# Patient Record
Sex: Female | Born: 1935
Health system: Southern US, Community
[De-identification: ages and names within clinical notes are randomized; demographics above are authoritative.]

## PROBLEM LIST (undated history)

## (undated) DIAGNOSIS — J439 Emphysema, unspecified: Secondary | ICD-10-CM

## (undated) DIAGNOSIS — F0390 Unspecified dementia without behavioral disturbance: Secondary | ICD-10-CM

## (undated) DIAGNOSIS — S065XAA Traumatic subdural hemorrhage with loss of consciousness status unknown, initial encounter: Secondary | ICD-10-CM

## (undated) DIAGNOSIS — R011 Cardiac murmur, unspecified: Secondary | ICD-10-CM

## (undated) DIAGNOSIS — IMO0001 Reserved for inherently not codable concepts without codable children: Secondary | ICD-10-CM

## (undated) DIAGNOSIS — R0902 Hypoxemia: Secondary | ICD-10-CM

## (undated) DIAGNOSIS — Z8719 Personal history of other diseases of the digestive system: Secondary | ICD-10-CM

## (undated) DIAGNOSIS — R1319 Other dysphagia: Secondary | ICD-10-CM

## (undated) DIAGNOSIS — I10 Essential (primary) hypertension: Secondary | ICD-10-CM

## (undated) DIAGNOSIS — M1990 Unspecified osteoarthritis, unspecified site: Secondary | ICD-10-CM

## (undated) DIAGNOSIS — C50919 Malignant neoplasm of unspecified site of unspecified female breast: Secondary | ICD-10-CM

## (undated) DIAGNOSIS — S065X9A Traumatic subdural hemorrhage with loss of consciousness of unspecified duration, initial encounter: Secondary | ICD-10-CM

## (undated) DIAGNOSIS — F419 Anxiety disorder, unspecified: Secondary | ICD-10-CM

## (undated) DIAGNOSIS — J449 Chronic obstructive pulmonary disease, unspecified: Secondary | ICD-10-CM

## (undated) DIAGNOSIS — I739 Peripheral vascular disease, unspecified: Secondary | ICD-10-CM

## (undated) DIAGNOSIS — C801 Malignant (primary) neoplasm, unspecified: Secondary | ICD-10-CM

## (undated) DIAGNOSIS — J45909 Unspecified asthma, uncomplicated: Secondary | ICD-10-CM

## (undated) DIAGNOSIS — K219 Gastro-esophageal reflux disease without esophagitis: Secondary | ICD-10-CM

## (undated) DIAGNOSIS — I509 Heart failure, unspecified: Secondary | ICD-10-CM

## (undated) HISTORY — PX: APPENDECTOMY: SHX54

## (undated) HISTORY — PX: ABDOMINAL HYSTERECTOMY: SHX81

## (undated) HISTORY — PX: EYE SURGERY: SHX253

## (undated) HISTORY — PX: BREAST BIOPSY: SHX20

## (undated) HISTORY — DX: Anxiety disorder, unspecified: F41.9

## (undated) HISTORY — PX: MASTECTOMY: SHX3

## (undated) HISTORY — PX: TONSILLECTOMY: SUR1361

---

## 1989-12-01 DIAGNOSIS — C801 Malignant (primary) neoplasm, unspecified: Secondary | ICD-10-CM

## 1989-12-01 DIAGNOSIS — C50919 Malignant neoplasm of unspecified site of unspecified female breast: Secondary | ICD-10-CM

## 1989-12-01 HISTORY — DX: Malignant (primary) neoplasm, unspecified: C80.1

## 1989-12-01 HISTORY — PX: BREAST SURGERY: SHX581

## 1989-12-01 HISTORY — DX: Malignant neoplasm of unspecified site of unspecified female breast: C50.919

## 2005-03-06 ENCOUNTER — Ambulatory Visit: Payer: Self-pay | Admitting: Internal Medicine

## 2005-05-01 ENCOUNTER — Ambulatory Visit: Payer: Self-pay

## 2005-11-28 ENCOUNTER — Ambulatory Visit: Payer: Self-pay | Admitting: Internal Medicine

## 2006-03-12 ENCOUNTER — Ambulatory Visit: Payer: Self-pay | Admitting: Internal Medicine

## 2007-05-10 ENCOUNTER — Ambulatory Visit: Payer: Self-pay | Admitting: Internal Medicine

## 2007-08-04 ENCOUNTER — Ambulatory Visit: Payer: Self-pay | Admitting: Unknown Physician Specialty

## 2008-05-15 ENCOUNTER — Ambulatory Visit: Payer: Self-pay | Admitting: Internal Medicine

## 2008-05-31 ENCOUNTER — Emergency Department: Payer: Self-pay | Admitting: Emergency Medicine

## 2008-08-07 ENCOUNTER — Other Ambulatory Visit: Payer: Self-pay

## 2008-08-07 ENCOUNTER — Inpatient Hospital Stay: Payer: Self-pay | Admitting: Orthopedic Surgery

## 2008-08-08 ENCOUNTER — Other Ambulatory Visit: Payer: Self-pay

## 2008-08-19 ENCOUNTER — Ambulatory Visit: Payer: Self-pay | Admitting: Internal Medicine

## 2008-08-29 ENCOUNTER — Ambulatory Visit: Payer: Self-pay | Admitting: Orthopedic Surgery

## 2008-09-04 ENCOUNTER — Ambulatory Visit: Payer: Self-pay | Admitting: Orthopedic Surgery

## 2009-06-07 ENCOUNTER — Ambulatory Visit: Payer: Self-pay | Admitting: Internal Medicine

## 2009-06-21 ENCOUNTER — Ambulatory Visit: Payer: Self-pay | Admitting: Cardiology

## 2010-01-30 ENCOUNTER — Emergency Department: Payer: Self-pay | Admitting: Emergency Medicine

## 2010-06-20 ENCOUNTER — Ambulatory Visit: Payer: Self-pay | Admitting: Internal Medicine

## 2011-06-23 ENCOUNTER — Ambulatory Visit: Payer: Self-pay | Admitting: Internal Medicine

## 2012-06-23 ENCOUNTER — Ambulatory Visit: Payer: Self-pay | Admitting: Family Medicine

## 2013-06-27 ENCOUNTER — Ambulatory Visit: Payer: Self-pay | Admitting: Family Medicine

## 2014-04-13 ENCOUNTER — Ambulatory Visit: Payer: Self-pay | Admitting: Dermatology

## 2014-07-04 ENCOUNTER — Ambulatory Visit: Payer: Self-pay | Admitting: Family Medicine

## 2014-07-25 ENCOUNTER — Emergency Department: Payer: Self-pay | Admitting: Emergency Medicine

## 2014-07-26 ENCOUNTER — Observation Stay (HOSPITAL_COMMUNITY)
Admission: AD | Admit: 2014-07-26 | Discharge: 2014-07-26 | Disposition: A | Discharge status: Home / Self Care | Payer: Medicare Other | Source: Other Acute Inpatient Hospital | Attending: Neurosurgery | Admitting: Neurosurgery

## 2014-07-26 ENCOUNTER — Encounter (HOSPITAL_COMMUNITY): Payer: Self-pay | Admitting: *Deleted

## 2014-07-26 DIAGNOSIS — Y93E1 Activity, personal bathing and showering: Secondary | ICD-10-CM | POA: Diagnosis not present

## 2014-07-26 DIAGNOSIS — Y92009 Unspecified place in unspecified non-institutional (private) residence as the place of occurrence of the external cause: Secondary | ICD-10-CM | POA: Insufficient documentation

## 2014-07-26 DIAGNOSIS — I10 Essential (primary) hypertension: Secondary | ICD-10-CM | POA: Insufficient documentation

## 2014-07-26 DIAGNOSIS — W010XXA Fall on same level from slipping, tripping and stumbling without subsequent striking against object, initial encounter: Secondary | ICD-10-CM | POA: Insufficient documentation

## 2014-07-26 DIAGNOSIS — J45909 Unspecified asthma, uncomplicated: Secondary | ICD-10-CM | POA: Insufficient documentation

## 2014-07-26 DIAGNOSIS — S065XAA Traumatic subdural hemorrhage with loss of consciousness status unknown, initial encounter: Secondary | ICD-10-CM | POA: Diagnosis present

## 2014-07-26 DIAGNOSIS — S065X0A Traumatic subdural hemorrhage without loss of consciousness, initial encounter: Principal | ICD-10-CM | POA: Insufficient documentation

## 2014-07-26 DIAGNOSIS — Z853 Personal history of malignant neoplasm of breast: Secondary | ICD-10-CM | POA: Insufficient documentation

## 2014-07-26 DIAGNOSIS — Z9079 Acquired absence of other genital organ(s): Secondary | ICD-10-CM | POA: Diagnosis not present

## 2014-07-26 DIAGNOSIS — S065X9A Traumatic subdural hemorrhage with loss of consciousness of unspecified duration, initial encounter: Secondary | ICD-10-CM | POA: Diagnosis present

## 2014-07-26 DIAGNOSIS — Z9010 Acquired absence of unspecified breast and nipple: Secondary | ICD-10-CM | POA: Insufficient documentation

## 2014-07-26 DIAGNOSIS — Y998 Other external cause status: Secondary | ICD-10-CM | POA: Insufficient documentation

## 2014-07-26 DIAGNOSIS — S0180XA Unspecified open wound of other part of head, initial encounter: Secondary | ICD-10-CM | POA: Diagnosis not present

## 2014-07-26 DIAGNOSIS — Z885 Allergy status to narcotic agent status: Secondary | ICD-10-CM | POA: Insufficient documentation

## 2014-07-26 DIAGNOSIS — Z87891 Personal history of nicotine dependence: Secondary | ICD-10-CM | POA: Diagnosis not present

## 2014-07-26 DIAGNOSIS — R011 Cardiac murmur, unspecified: Secondary | ICD-10-CM | POA: Insufficient documentation

## 2014-07-26 HISTORY — DX: Essential (primary) hypertension: I10

## 2014-07-26 HISTORY — DX: Unspecified asthma, uncomplicated: J45.909

## 2014-07-26 HISTORY — DX: Malignant (primary) neoplasm, unspecified: C80.1

## 2014-07-26 HISTORY — DX: Traumatic subdural hemorrhage with loss of consciousness of unspecified duration, initial encounter: S06.5X9A

## 2014-07-26 HISTORY — DX: Traumatic subdural hemorrhage with loss of consciousness status unknown, initial encounter: S06.5XAA

## 2014-07-26 HISTORY — DX: Chronic obstructive pulmonary disease, unspecified: J44.9

## 2014-07-26 HISTORY — DX: Cardiac murmur, unspecified: R01.1

## 2014-07-26 LAB — CBC WITH DIFFERENTIAL/PLATELET
Basophils Absolute: 0 10*3/uL (ref 0.0–0.1)
Basophils Relative: 0 % (ref 0–1)
Eosinophils Absolute: 0.1 10*3/uL (ref 0.0–0.7)
Eosinophils Relative: 2 % (ref 0–5)
HCT: 33.8 % — ABNORMAL LOW (ref 36.0–46.0)
Hemoglobin: 11.7 g/dL — ABNORMAL LOW (ref 12.0–15.0)
Lymphocytes Relative: 18 % (ref 12–46)
Lymphs Abs: 0.8 10*3/uL (ref 0.7–4.0)
MCH: 30.4 pg (ref 26.0–34.0)
MCHC: 34.6 g/dL (ref 30.0–36.0)
MCV: 87.8 fL (ref 78.0–100.0)
Monocytes Absolute: 0.5 10*3/uL (ref 0.1–1.0)
Monocytes Relative: 10 % (ref 3–12)
NEUTROS PCT: 70 % (ref 43–77)
Neutro Abs: 3.2 10*3/uL (ref 1.7–7.7)
PLATELETS: 172 10*3/uL (ref 150–400)
RBC: 3.85 MIL/uL — AB (ref 3.87–5.11)
RDW: 12.7 % (ref 11.5–15.5)
WBC: 4.5 10*3/uL (ref 4.0–10.5)

## 2014-07-26 LAB — CBC
HCT: 36.3 % (ref 35.0–47.0)
HGB: 12.2 g/dL (ref 12.0–16.0)
MCH: 31 pg (ref 26.0–34.0)
MCHC: 33.6 g/dL (ref 32.0–36.0)
MCV: 92 fL (ref 80–100)
Platelet: 173 10*3/uL (ref 150–440)
RBC: 3.94 10*6/uL (ref 3.80–5.20)
RDW: 13.2 % (ref 11.5–14.5)
WBC: 5.1 10*3/uL (ref 3.6–11.0)

## 2014-07-26 LAB — COMPREHENSIVE METABOLIC PANEL
ALBUMIN: 3.4 g/dL (ref 3.4–5.0)
ANION GAP: 10 (ref 7–16)
AST: 32 U/L (ref 15–37)
Alkaline Phosphatase: 71 U/L
BUN: 11 mg/dL (ref 7–18)
Bilirubin,Total: 0.4 mg/dL (ref 0.2–1.0)
CHLORIDE: 97 mmol/L — AB (ref 98–107)
Calcium, Total: 8.8 mg/dL (ref 8.5–10.1)
Co2: 27 mmol/L (ref 21–32)
Creatinine: 0.85 mg/dL (ref 0.60–1.30)
EGFR (African American): >60
EGFR (Non-African Amer.): >60
GLUCOSE: 119 mg/dL — AB (ref 65–99)
Osmolality: 269 (ref 275–301)
POTASSIUM: 3.6 mmol/L (ref 3.5–5.1)
SGPT (ALT): 21 U/L
SODIUM: 134 mmol/L — AB (ref 136–145)
Total Protein: 6.3 g/dL — ABNORMAL LOW (ref 6.4–8.2)

## 2014-07-26 LAB — URINALYSIS, COMPLETE
Bilirubin,UR: NEGATIVE
Blood: NEGATIVE
Glucose,UR: NEGATIVE mg/dL (ref 0–75)
NITRITE: NEGATIVE
PROTEIN: NEGATIVE
Ph: 7 (ref 4.5–8.0)
RBC,UR: 1 /HPF (ref 0–5)
SQUAMOUS EPITHELIAL: NONE SEEN
Specific Gravity: 1.01 (ref 1.003–1.030)
WBC UR: 1 /HPF (ref 0–5)

## 2014-07-26 LAB — APTT: APTT: 30 s (ref 24–37)

## 2014-07-26 LAB — MRSA PCR SCREENING: MRSA by PCR: NEGATIVE

## 2014-07-26 LAB — PROTIME-INR
INR: 1
INR: 1.15 (ref 0.00–1.49)
PROTHROMBIN TIME: 12.9 s (ref 11.5–14.7)
PROTHROMBIN TIME: 14.7 s (ref 11.6–15.2)

## 2014-07-26 LAB — TROPONIN I

## 2014-07-26 MED ORDER — PANTOPRAZOLE SODIUM 40 MG PO TBEC
40.0000 mg | DELAYED_RELEASE_TABLET | Freq: Every day | ORAL | Status: DC
  Administered 2014-07-26: 40 mg via ORAL
  Filled 2014-07-26: qty 1

## 2014-07-26 MED ORDER — ONDANSETRON HCL 4 MG/2ML IJ SOLN: 4.0000 mg | Freq: Four times a day (QID) | INTRAMUSCULAR | Status: DC | PRN

## 2014-07-26 MED ORDER — SODIUM CHLORIDE 0.9 % IJ SOLN
3.0000 mL | Freq: Two times a day (BID) | INTRAMUSCULAR | Status: DC
  Administered 2014-07-26: 3 mL via INTRAVENOUS

## 2014-07-26 MED ORDER — SODIUM CHLORIDE 0.9 % IV SOLN: 250.0000 mL | INTRAVENOUS | Status: DC | PRN

## 2014-07-26 MED ORDER — BISACODYL 5 MG PO TBEC: 5.0000 mg | DELAYED_RELEASE_TABLET | Freq: Every day | ORAL | Status: DC | PRN

## 2014-07-26 MED ORDER — SENNA 8.6 MG PO TABS
1.0000 | ORAL_TABLET | Freq: Two times a day (BID) | ORAL | Status: DC
  Administered 2014-07-26: 8.6 mg via ORAL
  Filled 2014-07-26 (×2): qty 1

## 2014-07-26 MED ORDER — POTASSIUM CHLORIDE IN NACL 20-0.9 MEQ/L-% IV SOLN
INTRAVENOUS | Status: DC
  Administered 2014-07-26: 06:00:00 via INTRAVENOUS
  Filled 2014-07-26 (×2): qty 1000

## 2014-07-26 MED ORDER — SODIUM CHLORIDE 0.9 % IJ SOLN: 3.0000 mL | INTRAMUSCULAR | Status: DC | PRN

## 2014-07-26 MED ORDER — WHITE PETROLATUM GEL
Status: AC
  Administered 2014-07-26: 0.2
  Filled 2014-07-26: qty 5

## 2014-07-26 MED ORDER — ONDANSETRON HCL 4 MG PO TABS: 4.0000 mg | ORAL_TABLET | Freq: Four times a day (QID) | ORAL | Status: DC | PRN

## 2014-07-26 MED ORDER — POLYETHYLENE GLYCOL 3350 17 G PO PACK
17.0000 g | PACK | Freq: Every day | ORAL | Status: DC | PRN
  Filled 2014-07-26: qty 1

## 2014-07-26 MED ORDER — OXYCODONE HCL 5 MG PO TABS: 5.0000 mg | ORAL_TABLET | ORAL | Status: DC | PRN

## 2014-07-26 MED ORDER — MAGNESIUM CITRATE PO SOLN
1.0000 | Freq: Once | ORAL | Status: DC | PRN
  Filled 2014-07-26: qty 296

## 2014-07-26 MED ORDER — ACETAMINOPHEN 325 MG PO TABS
650.0000 mg | ORAL_TABLET | Freq: Four times a day (QID) | ORAL | Status: DC | PRN
  Administered 2014-07-26 (×2): 650 mg via ORAL
  Filled 2014-07-26 (×2): qty 2

## 2014-07-26 MED ORDER — ACETAMINOPHEN 650 MG RE SUPP: 650.0000 mg | Freq: Four times a day (QID) | RECTAL | Status: DC | PRN

## 2014-07-26 NOTE — Progress Notes (Signed)
Pt dc to home. Reviewed dc instructions, pt verbalized understanding, all belongings returned. VSS

## 2014-07-26 NOTE — H&P (Signed)
Melanie Huffman is an 80 y.o. female.   Chief Complaint: subdural hematoma, facial laceration HPI: whom tripped over a fan cord and fell in her bathroom sometime around 2200 8/25. At [facility] a head ct revealed a very small subdural hematoma without mass effect. She had a normal neurologic examination there, and was transferred for neurosurgical evaluation.   Past Medical History  Diagnosis Date  . Heart murmur   . Hypertension   . Asthma   . COPD (chronic obstructive pulmonary disease)   . Cancer 1991    L Breast  . Traumatic subdural hematoma     Past Surgical History  Procedure Laterality Date  . Breast surgery  1991    L Mastectomy  . Tonsillectomy    . Appendectomy    . Abdominal hysterectomy      Partial    History reviewed. No pertinent family history. Social History:  reports that she quit smoking about 35 years ago. Her smoking use included Cigarettes. She has a 10 pack-year smoking history. She has never used smokeless tobacco. She reports that she does not drink alcohol or use illicit drugs.  Allergies:  Allergies  Allergen Reactions  . Codeine Nausea Only    No prescriptions prior to admission    No results found for this or any previous visit (from the past 48 hour(s)). No results found.  Review of Systems  Constitutional: Negative.   HENT: Negative.   Eyes: Negative.   Respiratory:       Mild difficulty breathing at times  Cardiovascular: Negative.   Gastrointestinal: Negative.   Genitourinary: Negative.   Musculoskeletal: Positive for falls.  Skin: Negative.   Neurological: Negative.   Endo/Heme/Allergies: Negative.   Psychiatric/Behavioral: Negative.     Blood pressure 159/77, pulse 81, temperature 97.3 F (36.3 C), temperature source Oral, resp. rate 13, height 5\' 5"  (1.651 m), weight 57.2 kg (126 lb 1.7 oz), SpO2 94.00%. Physical Exam  Constitutional: She is oriented to person, place, and time. She appears well-developed and well-nourished. No  distress.  HENT:  Head: Normocephalic.  Right Ear: External ear normal.  Left Ear: External ear normal.  Nose: Nose normal.  Mouth/Throat: Oropharynx is clear and moist.  Forehead laceration  Eyes: Conjunctivae and EOM are normal. Pupils are equal, round, and reactive to light. Right eye exhibits no discharge. Left eye exhibits discharge.  Neck: Normal range of motion. Neck supple.  Cardiovascular: Normal rate, regular rhythm and normal heart sounds.   Respiratory: Effort normal and breath sounds normal.  GI: Soft. Bowel sounds are normal.  Musculoskeletal: Normal range of motion. She exhibits no edema and no tenderness.  Neurological: She is alert and oriented to person, place, and time. She has normal strength and normal reflexes. She displays normal reflexes. No cranial nerve deficit or sensory deficit. She exhibits normal muscle tone. Coordination normal. GCS eye subscore is 4. GCS verbal subscore is 5. GCS motor subscore is 6. She displays no Babinski's sign on the right side. She displays no Babinski's sign on the left side.  Normal sensory examination light touch and proprioception     Assessment/Plan Admit for observation overnight. Melanie Huffman is normal, there is no surgical indication at this time. No repeat study is indicated at this time. Will discharge tomorrow if no neurological changes.   Ina Scrivens L 07/26/2014, 5:17 AM

## 2014-07-26 NOTE — Discharge Summary (Signed)
Physician Discharge Summary  Patient ID: Melanie Huffman MRN: [id number] DOB/AGE: February 15, 1928 80 y.o.  Admit date: 07/26/2014 Discharge date: 07/26/2014  Admission Diagnoses:Closed head injury,sudural hematoma  Discharge Diagnoses: closed head injury Active Problems:   Subdural hematoma   Discharged Condition: good  Hospital Course: Melanie Huffman was transferred from [facility] for evaluation of a subdural hematoma. After viewing the ct I am not sure this is subdural blood, none the less she has had and now has a normal neurologic exam. The facial laceration was closed primarily and is dressed. She is able to be discharged home and does not need followup at our facility. I have however given her my information in case their is a problem.   Treatments: surgery: repair facial laceration  Discharge Exam: Blood pressure 154/84, pulse 82, temperature 97.7 F (36.5 C), temperature source Oral, resp. rate 21, height 5\' 5"  (1.651 m), weight 57.2 kg (126 lb 1.7 oz), SpO2 96.00%. General appearance: alert, cooperative, appears stated age and no distress Neurologic: Alert and oriented X 3, normal strength and tone. Normal symmetric reflexes. Normal coordination and gait  Disposition: Final discharge disposition not confirmed * No surgery found *    Medication List         aspirin EC 81 MG tablet  Take 81 mg by mouth daily.     calcium citrate-vitamin D 315-200 MG-UNIT per tablet  Commonly known as:  CITRACAL+D  Take 1 tablet by mouth 2 (two) times daily.     clobetasol 0.05 % Gel  Commonly known as:  TEMOVATE  Apply 1 application topically as needed (if gums bleed after brushing teeth).     fluocinonide gel 0.05 %  Commonly known as:  LIDEX  Apply 1 application topically as needed (use after brushing teeth if they do not bleed).     Fluticasone-Salmeterol 250-50 MCG/DOSE Aepb  Commonly known as:  ADVAIR  Inhale 1 puff into the lungs 2 (two) times daily.     furosemide 20 MG tablet   Commonly known as:  LASIX  Take 20 mg by mouth 2 (two) times daily.     hydrochlorothiazide 25 MG tablet  Commonly known as:  HYDRODIURIL  Take 25 mg by mouth daily.     multivitamin-iron-minerals-folic acid chewable tablet  Chew 1 tablet by mouth daily.     omeprazole 40 MG capsule  Commonly known as:  PRILOSEC  Take 40 mg by mouth 2 (two) times daily.     potassium chloride SA 20 MEQ tablet  Commonly known as:  K-DUR,KLOR-CON  Take 40 mEq by mouth 2 (two) times daily.     raloxifene 60 MG tablet  Commonly known as:  EVISTA  Take 60 mg by mouth daily.     ramipril 10 MG capsule  Commonly known as:  ALTACE  Take 20 mg by mouth 2 (two) times daily.     theophylline 100 MG 24 hr capsule  Commonly known as:  THEO-24  Take 100 mg by mouth 2 (two) times daily.     tiotropium 18 MCG inhalation capsule  Commonly known as:  SPIRIVA  Place 18 mcg into inhaler and inhale 2 (two) times daily.     VITAMIN C PO  Take 1 capsule by mouth daily.           Follow-up Information   Follow up with Martita Brumm L, MD. (call if your are unable to find a physician to remove the sutures)    Specialty:  Neurosurgery   Contact  information:   [place] [address] [phone]       Signed: Iona Stay L 07/26/2014, 3:10 PM

## 2014-07-26 NOTE — Discharge Instructions (Signed)
.  kc °

## 2014-07-26 NOTE — Progress Notes (Signed)
UR completed 

## 2014-07-27 ENCOUNTER — Emergency Department: Payer: Self-pay | Admitting: Emergency Medicine

## 2014-10-29 ENCOUNTER — Emergency Department: Payer: Self-pay | Admitting: Internal Medicine

## 2014-10-29 LAB — COMPREHENSIVE METABOLIC PANEL
ALBUMIN: 3.4 g/dL (ref 3.4–5.0)
ALT: 19 U/L
Alkaline Phosphatase: 60 U/L
Anion Gap: 8 (ref 7–16)
BUN: 12 mg/dL (ref 7–18)
Bilirubin,Total: 0.5 mg/dL (ref 0.2–1.0)
CREATININE: 0.68 mg/dL (ref 0.60–1.30)
Calcium, Total: 8.6 mg/dL (ref 8.5–10.1)
Chloride: 100 mmol/L (ref 98–107)
Co2: 27 mmol/L (ref 21–32)
Glucose: 123 mg/dL — ABNORMAL HIGH (ref 65–99)
OSMOLALITY: 271 (ref 275–301)
Potassium: 3.4 mmol/L — ABNORMAL LOW (ref 3.5–5.1)
SGOT(AST): 23 U/L (ref 15–37)
Sodium: 135 mmol/L — ABNORMAL LOW (ref 136–145)
TOTAL PROTEIN: 5.9 g/dL — AB (ref 6.4–8.2)

## 2014-10-29 LAB — CBC
HCT: 39.7 % (ref 35.0–47.0)
HGB: 13 g/dL (ref 12.0–16.0)
MCH: 30.4 pg (ref 26.0–34.0)
MCHC: 32.8 g/dL (ref 32.0–36.0)
MCV: 93 fL (ref 80–100)
Platelet: 152 10*3/uL (ref 150–440)
RBC: 4.28 10*6/uL (ref 3.80–5.20)
RDW: 13.4 % (ref 11.5–14.5)
WBC: 7.3 10*3/uL (ref 3.6–11.0)

## 2014-10-29 LAB — TROPONIN I: Troponin-I: <0.02 ng/mL

## 2014-10-29 LAB — D-DIMER(ARMC): D-Dimer: 369 ng/ml

## 2015-04-11 ENCOUNTER — Inpatient Hospital Stay
Admission: AD | Admit: 2015-04-11 | Discharge: 2015-04-16 | DRG: 516 | Disposition: A | Discharge status: Skilled Nursing Facility | Payer: PPO | Source: Ambulatory Visit | Attending: Orthopedic Surgery | Admitting: Orthopedic Surgery

## 2015-04-11 ENCOUNTER — Inpatient Hospital Stay: Payer: PPO

## 2015-04-11 DIAGNOSIS — K219 Gastro-esophageal reflux disease without esophagitis: Secondary | ICD-10-CM | POA: Diagnosis present

## 2015-04-11 DIAGNOSIS — S82002B Unspecified fracture of left patella, initial encounter for open fracture type I or II: Secondary | ICD-10-CM

## 2015-04-11 DIAGNOSIS — Z87891 Personal history of nicotine dependence: Secondary | ICD-10-CM | POA: Diagnosis not present

## 2015-04-11 DIAGNOSIS — S82032A Displaced transverse fracture of left patella, initial encounter for closed fracture: Secondary | ICD-10-CM | POA: Diagnosis present

## 2015-04-11 DIAGNOSIS — W1830XA Fall on same level, unspecified, initial encounter: Secondary | ICD-10-CM | POA: Diagnosis present

## 2015-04-11 DIAGNOSIS — J45909 Unspecified asthma, uncomplicated: Secondary | ICD-10-CM | POA: Diagnosis present

## 2015-04-11 DIAGNOSIS — J449 Chronic obstructive pulmonary disease, unspecified: Secondary | ICD-10-CM | POA: Diagnosis present

## 2015-04-11 DIAGNOSIS — R739 Hyperglycemia, unspecified: Secondary | ICD-10-CM | POA: Diagnosis present

## 2015-04-11 DIAGNOSIS — S82009A Unspecified fracture of unspecified patella, initial encounter for closed fracture: Secondary | ICD-10-CM | POA: Diagnosis present

## 2015-04-11 DIAGNOSIS — M810 Age-related osteoporosis without current pathological fracture: Secondary | ICD-10-CM | POA: Diagnosis present

## 2015-04-11 DIAGNOSIS — I10 Essential (primary) hypertension: Secondary | ICD-10-CM

## 2015-04-11 DIAGNOSIS — W19XXXA Unspecified fall, initial encounter: Secondary | ICD-10-CM | POA: Diagnosis present

## 2015-04-11 DIAGNOSIS — Z853 Personal history of malignant neoplasm of breast: Secondary | ICD-10-CM | POA: Diagnosis not present

## 2015-04-11 DIAGNOSIS — Z452 Encounter for adjustment and management of vascular access device: Secondary | ICD-10-CM

## 2015-04-11 DIAGNOSIS — I739 Peripheral vascular disease, unspecified: Secondary | ICD-10-CM | POA: Diagnosis present

## 2015-04-11 DIAGNOSIS — E876 Hypokalemia: Secondary | ICD-10-CM | POA: Diagnosis present

## 2015-04-11 DIAGNOSIS — Y929 Unspecified place or not applicable: Secondary | ICD-10-CM

## 2015-04-11 DIAGNOSIS — E871 Hypo-osmolality and hyponatremia: Secondary | ICD-10-CM | POA: Diagnosis present

## 2015-04-11 DIAGNOSIS — F419 Anxiety disorder, unspecified: Secondary | ICD-10-CM | POA: Diagnosis present

## 2015-04-11 DIAGNOSIS — Z419 Encounter for procedure for purposes other than remedying health state, unspecified: Secondary | ICD-10-CM

## 2015-04-11 LAB — URINALYSIS COMPLETE WITH MICROSCOPIC (ARMC ONLY)
Bacteria, UA: NONE SEEN
Bilirubin Urine: NEGATIVE
Glucose, UA: NEGATIVE mg/dL
HGB URINE DIPSTICK: NEGATIVE
Nitrite: NEGATIVE
Protein, ur: NEGATIVE mg/dL
Specific Gravity, Urine: 1.012 (ref 1.005–1.030)
pH: 7 (ref 5.0–8.0)

## 2015-04-11 LAB — BASIC METABOLIC PANEL
Anion gap: 7 (ref 5–15)
BUN: 11 mg/dL (ref 6–20)
CO2: 28 mmol/L (ref 22–32)
CREATININE: 0.68 mg/dL (ref 0.44–1.00)
Calcium: 8.8 mg/dL — ABNORMAL LOW (ref 8.9–10.3)
Chloride: 98 mmol/L — ABNORMAL LOW (ref 101–111)
GFR calc non Af Amer: >60 mL/min (ref >60)
Glucose, Bld: 137 mg/dL — ABNORMAL HIGH (ref 65–99)
Potassium: 3.2 mmol/L — ABNORMAL LOW (ref 3.5–5.1)
Sodium: 133 mmol/L — ABNORMAL LOW (ref 135–145)

## 2015-04-11 LAB — CBC
HCT: 39 % (ref 35.0–47.0)
Hemoglobin: 12.8 g/dL (ref 12.0–16.0)
MCH: 30.1 pg (ref 26.0–34.0)
MCHC: 33 g/dL (ref 32.0–36.0)
MCV: 91.3 fL (ref 80.0–100.0)
Platelets: 197 10*3/uL (ref 150–440)
RBC: 4.27 MIL/uL (ref 3.80–5.20)
RDW: 13.4 % (ref 11.5–14.5)
WBC: 4.3 10*3/uL (ref 3.6–11.0)

## 2015-04-11 LAB — PROTIME-INR
INR: 1
PROTHROMBIN TIME: 13.4 s (ref 11.4–15.0)

## 2015-04-11 MED ORDER — CALCIUM CITRATE-VITAMIN D 315-250 MG-UNIT PO TABS
1.0000 | ORAL_TABLET | Freq: Two times a day (BID) | ORAL | Status: DC
  Filled 2015-04-11 (×5): qty 2

## 2015-04-11 MED ORDER — CALCIUM CITRATE-VITAMIN D 315-200 MG-UNIT PO TABS
1.0000 | ORAL_TABLET | Freq: Two times a day (BID) | ORAL | Status: DC
  Filled 2015-04-11 (×5): qty 1

## 2015-04-11 MED ORDER — ASPIRIN EC 325 MG PO TBEC: 325.0000 mg | DELAYED_RELEASE_TABLET | Freq: Every day | ORAL | Status: DC

## 2015-04-11 MED ORDER — THEOPHYLLINE ER 100 MG PO CP24
100.0000 mg | ORAL_CAPSULE | Freq: Two times a day (BID) | ORAL | Status: DC
  Administered 2015-04-12 – 2015-04-16 (×7): 100 mg via ORAL
  Filled 2015-04-11 (×12): qty 1

## 2015-04-11 MED ORDER — TIOTROPIUM BROMIDE MONOHYDRATE 18 MCG IN CAPS
18.0000 ug | ORAL_CAPSULE | Freq: Two times a day (BID) | RESPIRATORY_TRACT | Status: DC
  Administered 2015-04-12 – 2015-04-16 (×6): 18 ug via RESPIRATORY_TRACT
  Filled 2015-04-11 (×2): qty 5

## 2015-04-11 MED ORDER — POTASSIUM CHLORIDE CRYS ER 20 MEQ PO TBCR
40.0000 meq | EXTENDED_RELEASE_TABLET | Freq: Two times a day (BID) | ORAL | Status: DC
  Administered 2015-04-11: 40 meq via ORAL
  Filled 2015-04-11 (×4): qty 2

## 2015-04-11 MED ORDER — MOMETASONE FURO-FORMOTEROL FUM 100-5 MCG/ACT IN AERO
2.0000 | INHALATION_SPRAY | Freq: Two times a day (BID) | RESPIRATORY_TRACT | Status: DC
  Administered 2015-04-11 – 2015-04-16 (×8): 2 via RESPIRATORY_TRACT
  Filled 2015-04-11: qty 8.8

## 2015-04-11 MED ORDER — PNEUMOCOCCAL VAC POLYVALENT 25 MCG/0.5ML IJ INJ
0.5000 mL | INJECTION | INTRAMUSCULAR | Status: DC
  Filled 2015-04-11: qty 0.5

## 2015-04-11 MED ORDER — FUROSEMIDE 20 MG PO TABS
20.0000 mg | ORAL_TABLET | Freq: Two times a day (BID) | ORAL | Status: DC
  Administered 2015-04-13: 20 mg via ORAL
  Filled 2015-04-11 (×3): qty 1

## 2015-04-11 MED ORDER — SODIUM CHLORIDE 0.9 % IV SOLN
INTRAVENOUS | Status: DC
  Administered 2015-04-11 – 2015-04-14 (×5): via INTRAVENOUS

## 2015-04-11 MED ORDER — MORPHINE SULFATE 2 MG/ML IJ SOLN
0.5000 mg | INTRAMUSCULAR | Status: DC | PRN
Start: 2015-04-11 — End: 2015-04-16
  Administered 2015-04-11 – 2015-04-12 (×3): 0.5 mg via INTRAVENOUS
  Filled 2015-04-11 (×3): qty 1

## 2015-04-11 MED ORDER — HYDROCODONE-ACETAMINOPHEN 5-325 MG PO TABS
1.0000 | ORAL_TABLET | Freq: Four times a day (QID) | ORAL | Status: DC | PRN
  Administered 2015-04-12 (×2): 2 via ORAL
  Administered 2015-04-13 – 2015-04-14 (×7): 1 via ORAL
  Administered 2015-04-15 (×2): 2 via ORAL
  Administered 2015-04-16: 1 via ORAL
  Filled 2015-04-11 (×3): qty 1
  Filled 2015-04-11 (×3): qty 2
  Filled 2015-04-11: qty 1
  Filled 2015-04-11: qty 2
  Filled 2015-04-11 (×5): qty 1

## 2015-04-11 MED ORDER — ASPIRIN EC 81 MG PO TBEC: 81.0000 mg | DELAYED_RELEASE_TABLET | Freq: Every day | ORAL | Status: DC

## 2015-04-11 MED ORDER — MAGNESIUM HYDROXIDE 400 MG/5ML PO SUSP
30.0000 mL | Freq: Every day | ORAL | Status: DC | PRN
  Administered 2015-04-12: 30 mL via ORAL
  Filled 2015-04-11: qty 30

## 2015-04-11 MED ORDER — RAMIPRIL 5 MG PO CAPS
20.0000 mg | ORAL_CAPSULE | Freq: Two times a day (BID) | ORAL | Status: DC
  Administered 2015-04-13 – 2015-04-16 (×6): 20 mg via ORAL
  Filled 2015-04-11 (×8): qty 4

## 2015-04-11 MED ORDER — RALOXIFENE HCL 60 MG PO TABS
60.0000 mg | ORAL_TABLET | Freq: Every day | ORAL | Status: DC
  Administered 2015-04-13 – 2015-04-16 (×4): 60 mg via ORAL
  Filled 2015-04-11 (×5): qty 1

## 2015-04-11 MED ORDER — HYDROCHLOROTHIAZIDE 25 MG PO TABS
25.0000 mg | ORAL_TABLET | Freq: Every day | ORAL | Status: DC
  Administered 2015-04-12: 25 mg via ORAL
  Filled 2015-04-11 (×2): qty 1

## 2015-04-11 MED ORDER — DOCUSATE SODIUM 100 MG PO CAPS
100.0000 mg | ORAL_CAPSULE | Freq: Two times a day (BID) | ORAL | Status: DC
  Administered 2015-04-11 – 2015-04-16 (×10): 100 mg via ORAL
  Filled 2015-04-11 (×11): qty 1

## 2015-04-11 MED ORDER — PANTOPRAZOLE SODIUM 40 MG PO TBEC
40.0000 mg | DELAYED_RELEASE_TABLET | Freq: Every day | ORAL | Status: DC
  Administered 2015-04-11 – 2015-04-16 (×5): 40 mg via ORAL
  Filled 2015-04-11 (×6): qty 1

## 2015-04-11 NOTE — H&P (Signed)
Patient presents with 1 day h/o left sided knee pain after a fall. She fell at [employer] when she was going there this morning to exercise. She denies loss of consciousness does know why she fell. She came into the walk-in clinic and external clinic was found to have a displaced patella fracture is being admitted for treatment of this..   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 Codeine causing nausea Sulfa causing nausea  Prior medical problems benign essential hypertension, COPD moderate, allergic rhinitis due to allergens, GERD without esophagitis, posture arthritis and osteoporosis History of breast cancer general anxiety and atypical chest pain  Current medications Advair Diskus 250-50 daily Hydralazine 25 mg daily HCTZ 25 mg daily Multivitamin daily Prilosec 20 mg daily Paxil 20 mg by mouth daily KCl 20 mg ER daily Evista 60 mg by mouth daily Altace 10 mg daily Theophylline 200 mg extended release Spiriva 18 g inhalation capsule daily Trazodone 50 mg at night Calcium and vitamin D daily  Review of systems is positive for the left knee pain but she does have a history of peripheral vascular disease as well and was to have a angioplasty by Dr. Lillia Pauls next week.  Family history noncontributory  Social history: Negative for alcohol, retired lives alone  Physical exam Gen.: Slender white female appears her stated age in mild distress secondary to left knee pain HEENT: Normal no evidence of trauma Lungs clear no wheezing noted Heart regular rate and rhythm no murmur noted Abdomen soft nontender Extremity exam left lower extremity: Palpable defect in the patella over centimeter with mild swelling and ecchymosis, distal neurovascularly near her sensation is  intact she does not have palpable pulses patella posterior tib or herself pedis. She is unable to maintain extension against gravity, unable to actively extend the knee  X-rays from walk-in clinic show displaced patella fracture  Impression: Displaced transverse patella fracture with loss of extension strength  Plan: Admit for ORIF tomorrow probably will require rehabilitation stay as she lives alone and has poor circulation initially. With her COPD we'll consult Dr. Netty Starring, her regular physician Risk benefits possible complications and alternatives were discussed

## 2015-04-11 NOTE — Plan of Care (Signed)
Problem: Phase III Progression Outcomes Goal: IV/normal saline lock discontinued Outcome: Progressing Discontinued Iv.  Pt to receive picc line

## 2015-04-11 NOTE — Consult Note (Signed)
Reason for Consult: Medical management/preop clearance  Referring Physician: Dr. Rudene Christians  HPI: Melanie Huffman is an 81 y.o. Female with a past medical history of hypertension/asthma/COPD who presents after a mechanical fall. Patient [place] the [place] and she [place]. She went to urgent care where they diagnosed her with a fracture of the left patella. Patient denies chest pain, dizziness, lightheadedness prior to her fall. She says this is purely a mechanical fall. Patient's been in her usual state of health without any chest pain, shortness of breath, or  Past Medical History  Diagnosis Date  . Heart murmur   . Hypertension   . Asthma   . COPD (chronic obstructive pulmonary disease)   . Cancer 1991    L Breast  . Traumatic subdural hematoma    peripheral arterial disease  Past Surgical History  Procedure Laterality Date  . Breast surgery  1991    L Mastectomy  . Tonsillectomy    . Appendectomy    . Abdominal hysterectomy      Partial    Family history: Positive for coronary artery disease, stroke, kidney failure  Social History:  reports that she quit smoking about 36 years ago. Her smoking use included Cigarettes. She has a 10 pack-year smoking history. She has never used smokeless tobacco. She reports that she does not drink alcohol or use illicit drugs.  Allergies:  Allergies  Allergen Reactions  . Codeine Nausea Only    Medications: I have reviewed the patient's current medications.  Advair BID spiriva daily Ramipril 20 mg daily HCTZ 25 mg daily Asa 81 mg daily Lasix 40 mg BID KCL 20 mEq BID  Results for orders placed or performed during the hospital encounter of 04/11/15 (from the past 48 hour(s))  CBC     Status: None   Collection Time: 04/11/15 12:54 PM  Result Value Ref Range   WBC 4.3 3.6 - 11.0 K/uL   RBC 4.27 3.80 - 5.20 MIL/uL   Hemoglobin 12.8 12.0 - 16.0 g/dL   HCT 39.0 35.0 - 47.0 %   MCV 91.3 80.0 - 100.0 fL   MCH 30.1 26.0 - 34.0 pg   MCHC 33.0  32.0 - 36.0 g/dL   RDW 13.4 11.5 - 14.5 %   Platelets 197 150 - 440 K/uL  Protime-INR     Status: None   Collection Time: 04/11/15 12:54 PM  Result Value Ref Range   Prothrombin Time 13.4 11.4 - 15.0 seconds   INR 7.02   Basic metabolic panel     Status: Abnormal   Collection Time: 04/11/15 12:54 PM  Result Value Ref Range   Sodium 133 (L) 135 - 145 mmol/L   Potassium 3.2 (L) 3.5 - 5.1 mmol/L   Chloride 98 (L) 101 - 111 mmol/L   CO2 28 22 - 32 mmol/L   Glucose, Bld 137 (H) 65 - 99 mg/dL   BUN 11 6 - 20 mg/dL   Creatinine, Ser 0.68 0.44 - 1.00 mg/dL   Calcium 8.8 (L) 8.9 - 10.3 mg/dL   GFR calc non Af Amer >60 >60 mL/min   GFR calc Af Amer >60 >60 mL/min    Comment: (NOTE) The eGFR has been calculated using the CKD EPI equation. This calculation has not been validated in all clinical situations. eGFR's persistently <60 mL/min signify possible Chronic Kidney Disease.    Anion gap 7 5 - 15    Chest Portable 1 View  04/11/2015   .  IMPRESSION: No  radiographic evidence of acute cardiopulmonary disease, with chronic changes of emphysema.  Atherosclerosis.  Signed,  Dulcy Fanny. Earleen Newport, DO  [facility]  [facility]   Electronically Signed   By: Corrie Mckusick D.O.   On: 04/11/2015 13:37    Review of Systems  Constitutional: Negative for fever, chills, weight loss and malaise/fatigue.  HENT: Negative for ear pain, hearing loss and tinnitus.   Eyes: Negative for blurred vision and double vision.  Respiratory: Negative for cough and hemoptysis.   Cardiovascular: Positive for leg swelling. Negative for chest pain, palpitations and orthopnea.  Gastrointestinal: Negative for heartburn, nausea, vomiting, abdominal pain and diarrhea.  Genitourinary: Negative for dysuria.  Musculoskeletal: Negative for myalgias.  Skin: Negative for rash.  Neurological: Negative for dizziness, tingling, tremors and headaches.  Psychiatric/Behavioral: Negative for  depression.   Blood pressure 138/92, pulse 75, temperature 97.8 F (36.6 C), temperature source Oral, resp. rate 18, SpO2 95 %. Physical Exam  Constitutional: She is oriented to person, place, and time. She appears well-developed and well-nourished.  HENT:  Head: Normocephalic and atraumatic.  Eyes: Pupils are equal, round, and reactive to light.  Neck: Normal range of motion. Neck supple. No thyromegaly present.  Cardiovascular: Regular rhythm and normal heart sounds.  Exam reveals no friction rub.   No murmur heard. Respiratory: Breath sounds normal. No respiratory distress.  GI: Bowel sounds are normal. She exhibits no distension. There is no tenderness. There is no rebound.  Musculoskeletal: Normal range of motion. She exhibits edema. She exhibits no tenderness.  LLE edema for over a month  Neurological: She is alert and oriented to person, place, and time. No cranial nerve deficit.  Skin: Skin is warm and dry. No rash noted. No erythema.  Psychiatric: She has a normal mood and affect. Her behavior is normal.    Assessment/Plan: 81 year old female with a history of hypertension, left lower extremity PAD who presents after a mechanical fall with a left patella fracture. Hospitalist clearance for preoperative clearance.   1. Preoperative clearance: Patient is low risk for moderate risk procedure provided that her EKG does not anything acute (which I am doubtful of). We are awaiting EKG, If this does not show acute changes, she may proceed without further cardiac workup.  2. Hypertension: Patient should continue on her outpatient medications including Ramilpril and HCTZ.  3. Mild hyponatremia: Will cont with IVF and recheck in am. If sodium level is still low, I would suggest stopping HCTZ and ordering a beta blocker for HTN.  4. Hypokalemia: I will replete and check in am  5. Chronic LEE: Patient was planned for an outpatient procedure with Dr. Delana Meyer on Tuesday. We can have him  see her while she is here if this is okay with Dr. Rudene Christians.  6, Hyperglycemia: Recheck BMP in am. If still elevated then add HGBa1c to am labs.  7. COPD: I would continue her outpatient inhalers. There is no evidence of acute exacerbation at this time.   Thank you for allowing [place] to participate in the care of your patient. We will follow.   TIME 45 minutes   Batsheva Stevick 04/11/2015, 1:41 PM

## 2015-04-11 NOTE — Progress Notes (Signed)
Patient scheduled for surgery tomorrow. Iv infiltrated. [facility] called for Picc placement notified Dr.Hooten about unable to get iv access. Daughter at bedside, Pt npo at midnight.

## 2015-04-12 ENCOUNTER — Inpatient Hospital Stay: Payer: PPO | Admitting: Anesthesiology

## 2015-04-12 ENCOUNTER — Encounter
Admission: AD | Disposition: A | Discharge status: Skilled Nursing Facility | Payer: Self-pay | Source: Ambulatory Visit | Attending: Internal Medicine

## 2015-04-12 ENCOUNTER — Inpatient Hospital Stay: Payer: PPO

## 2015-04-12 ENCOUNTER — Encounter: Payer: Self-pay | Admitting: Anesthesiology

## 2015-04-12 HISTORY — PX: ORIF PATELLA: SHX5033

## 2015-04-12 LAB — CBC
HCT: 36.4 % (ref 35.0–47.0)
Hemoglobin: 12.4 g/dL (ref 12.0–16.0)
MCH: 30.9 pg (ref 26.0–34.0)
MCHC: 34.2 g/dL (ref 32.0–36.0)
MCV: 90.5 fL (ref 80.0–100.0)
PLATELETS: 195 10*3/uL (ref 150–440)
RBC: 4.02 MIL/uL (ref 3.80–5.20)
RDW: 13.2 % (ref 11.5–14.5)
WBC: 5.7 10*3/uL (ref 3.6–11.0)

## 2015-04-12 LAB — BASIC METABOLIC PANEL
ANION GAP: 5 (ref 5–15)
BUN: 7 mg/dL (ref 6–20)
CO2: 24 mmol/L (ref 22–32)
CREATININE: 0.47 mg/dL (ref 0.44–1.00)
Calcium: 7.2 mg/dL — ABNORMAL LOW (ref 8.9–10.3)
Chloride: 106 mmol/L (ref 101–111)
GFR calc Af Amer: >60 mL/min (ref >60)
GFR calc non Af Amer: >60 mL/min (ref >60)
Glucose, Bld: 85 mg/dL (ref 65–99)
Potassium: 2.9 mmol/L — CL (ref 3.5–5.1)
Sodium: 135 mmol/L (ref 135–145)

## 2015-04-12 LAB — POCT I-STAT 4, (NA,K, GLUC, HGB,HCT) (ARMC MAN. ENTRY)
HCT: 36 % (ref 36–46)
Hemoglobin: 12.2 g/dL (ref 12.0–16.0)
Potassium: 5 mmol/L (ref 3.4–5.3)
Sodium: 133 mmol/L — AB (ref 135–145)

## 2015-04-12 LAB — CREATININE, SERUM
Creatinine, Ser: 0.63 mg/dL (ref 0.44–1.00)
GFR calc Af Amer: >60 mL/min (ref >60)
GFR calc non Af Amer: >60 mL/min (ref >60)

## 2015-04-12 LAB — MAGNESIUM: MAGNESIUM: 1.4 mg/dL — AB (ref 1.7–2.4)

## 2015-04-12 SURGERY — OPEN REDUCTION INTERNAL FIXATION (ORIF) PATELLA
Anesthesia: Spinal | Laterality: Left

## 2015-04-12 MED ORDER — BUPIVACAINE HCL (PF) 0.5 % IJ SOLN
INTRAMUSCULAR | Status: DC | PRN
  Administered 2015-04-12: 3 mL

## 2015-04-12 MED ORDER — ONDANSETRON HCL 4 MG/2ML IJ SOLN
INTRAMUSCULAR | Status: DC | PRN
  Administered 2015-04-12: 4 mg via INTRAVENOUS

## 2015-04-12 MED ORDER — ONDANSETRON HCL 4 MG PO TABS
4.0000 mg | ORAL_TABLET | Freq: Four times a day (QID) | ORAL | Status: DC | PRN
  Administered 2015-04-15: 4 mg via ORAL
  Filled 2015-04-12: qty 1

## 2015-04-12 MED ORDER — PROPOFOL INFUSION 10 MG/ML OPTIME
INTRAVENOUS | Status: DC | PRN
  Administered 2015-04-12: 50 ug/kg/min via INTRAVENOUS

## 2015-04-12 MED ORDER — ENOXAPARIN SODIUM 30 MG/0.3ML ~~LOC~~ SOLN
30.0000 mg | Freq: Two times a day (BID) | SUBCUTANEOUS | Status: DC
  Administered 2015-04-13 – 2015-04-16 (×8): 30 mg via SUBCUTANEOUS
  Filled 2015-04-12 (×8): qty 0.3

## 2015-04-12 MED ORDER — HYDROMORPHONE HCL 1 MG/ML IJ SOLN: 0.2500 mg | INTRAMUSCULAR | Status: DC | PRN

## 2015-04-12 MED ORDER — ONDANSETRON HCL 4 MG/2ML IJ SOLN: 4.0000 mg | Freq: Once | INTRAMUSCULAR | Status: DC | PRN

## 2015-04-12 MED ORDER — FENTANYL CITRATE (PF) 100 MCG/2ML IJ SOLN: 25.0000 ug | INTRAMUSCULAR | Status: DC | PRN

## 2015-04-12 MED ORDER — NEOMYCIN-POLYMYXIN B GU 40-200000 IR SOLN
Status: AC
  Filled 2015-04-12: qty 4

## 2015-04-12 MED ORDER — CALCIUM CARBONATE-VITAMIN D 500-200 MG-UNIT PO TABS
1.0000 | ORAL_TABLET | Freq: Two times a day (BID) | ORAL | Status: DC
  Administered 2015-04-12 – 2015-04-16 (×8): 1 via ORAL
  Filled 2015-04-12 (×16): qty 1

## 2015-04-12 MED ORDER — DEXTROSE-NACL 5-0.9 % IV SOLN
INTRAVENOUS | Status: DC
  Administered 2015-04-13: via INTRAVENOUS

## 2015-04-12 MED ORDER — FENTANYL CITRATE (PF) 100 MCG/2ML IJ SOLN
INTRAMUSCULAR | Status: DC | PRN
  Administered 2015-04-12: 50 ug via INTRAVENOUS

## 2015-04-12 MED ORDER — CEFAZOLIN SODIUM 1-5 GM-% IV SOLN
INTRAVENOUS | Status: DC | PRN
  Administered 2015-04-12: 1 g via INTRAVENOUS

## 2015-04-12 MED ORDER — POTASSIUM CHLORIDE 20 MEQ/15ML (10%) PO SOLN
40.0000 meq | Freq: Once | ORAL | Status: AC
  Administered 2015-04-12: 40 meq via ORAL
  Filled 2015-04-12: qty 30

## 2015-04-12 MED ORDER — ACETAMINOPHEN 325 MG PO TABS
650.0000 mg | ORAL_TABLET | Freq: Four times a day (QID) | ORAL | Status: DC | PRN
  Administered 2015-04-12: 650 mg via ORAL
  Filled 2015-04-12: qty 2

## 2015-04-12 MED ORDER — NEOMYCIN-POLYMYXIN B GU IR SOLN
Status: DC | PRN
  Administered 2015-04-12: 4 mL

## 2015-04-12 MED ORDER — BISACODYL 10 MG RE SUPP
10.0000 mg | Freq: Every day | RECTAL | Status: DC | PRN
  Administered 2015-04-13 – 2015-04-14 (×2): 10 mg via RECTAL
  Filled 2015-04-12 (×2): qty 1

## 2015-04-12 MED ORDER — CEFAZOLIN SODIUM 1-5 GM-% IV SOLN
1.0000 g | Freq: Four times a day (QID) | INTRAVENOUS | Status: AC
  Administered 2015-04-12 – 2015-04-13 (×3): 1 g via INTRAVENOUS
  Filled 2015-04-12 (×4): qty 50

## 2015-04-12 MED ORDER — ADULT MULTIVITAMIN W/MINERALS CH
ORAL_TABLET | Freq: Every day | ORAL | Status: DC
  Administered 2015-04-13 – 2015-04-16 (×4): 1 via ORAL
  Filled 2015-04-12 (×8): qty 1

## 2015-04-12 MED ORDER — ACETAMINOPHEN 650 MG RE SUPP: 650.0000 mg | Freq: Four times a day (QID) | RECTAL | Status: DC | PRN

## 2015-04-12 MED ORDER — ONDANSETRON HCL 4 MG/2ML IJ SOLN: 4.0000 mg | Freq: Four times a day (QID) | INTRAMUSCULAR | Status: DC | PRN

## 2015-04-12 MED ORDER — POTASSIUM CHLORIDE 10 MEQ/100ML IV SOLN
10.0000 meq | INTRAVENOUS | Status: AC
  Administered 2015-04-12 (×2): 10 meq via INTRAVENOUS
  Filled 2015-04-12 (×4): qty 100

## 2015-04-12 MED ORDER — PHENOL 1.4 % MT LIQD: 1.0000 | OROMUCOSAL | Status: DC | PRN

## 2015-04-12 MED ORDER — MENTHOL 3 MG MT LOZG
1.0000 | LOZENGE | OROMUCOSAL | Status: DC | PRN
  Filled 2015-04-12: qty 9

## 2015-04-12 SURGICAL SUPPLY — 43 items
BANDAGE ELASTIC 6 CLIP NS LF (GAUZE/BANDAGES/DRESSINGS) ×3 IMPLANT
BLADE SURG SZ10 CARB STEEL (BLADE) ×9 IMPLANT
BNDG COHESIVE 4X5 TAN STRL (GAUZE/BANDAGES/DRESSINGS) ×3 IMPLANT
CANISTER SUCT 1200ML W/VALVE (MISCELLANEOUS) ×3 IMPLANT
CATH IV ANGIO 16GX3.25 GREY (CATHETERS) ×1 IMPLANT
CHLORAPREP W/TINT 26ML (MISCELLANEOUS) ×6 IMPLANT
DRAPE C-ARM XRAY 36X54 (DRAPES) ×3 IMPLANT
DRAPE C-ARMOR (DRAPES) ×3 IMPLANT
DRAPE INCISE IOBAN 66X45 STRL (DRAPES) ×3 IMPLANT
DRAPE U-SHAPE 47X51 STRL (DRAPES) ×3 IMPLANT
ELECT CAUTERY BLADE 6.4 (BLADE) ×3 IMPLANT
GAUZE PETRO XEROFOAM 1X8 (MISCELLANEOUS) ×3 IMPLANT
GAUZE SPONGE 4X4 12PLY STRL (GAUZE/BANDAGES/DRESSINGS) ×3 IMPLANT
GAUZE XEROFORM 4X4 STRL (GAUZE/BANDAGES/DRESSINGS) ×3 IMPLANT
GLOVE SURG ORTHO 9.0 STRL STRW (GLOVE) ×3 IMPLANT
GOWN SPECIALTY ULTRA XL (MISCELLANEOUS) ×3 IMPLANT
GOWN STRL REUS W/ TWL LRG LVL3 (GOWN DISPOSABLE) ×1 IMPLANT
GOWN STRL REUS W/TWL LRG LVL3 (GOWN DISPOSABLE) ×2
HANDLE YANKAUER SUCT BULB TIP (MISCELLANEOUS) ×3 IMPLANT
HEMOVAC 400CC 10FR (MISCELLANEOUS) ×3 IMPLANT
IMMOB KNEE 24 THIGH 24 443303 (SOFTGOODS) ×3 IMPLANT
IV CATH ANGIO 16GX3.25 GREY (CATHETERS) ×3
NS IRRIG 500ML POUR BTL (IV SOLUTION) ×3 IMPLANT
PACK EXTREMITY ARMC (MISCELLANEOUS) ×3 IMPLANT
PAD ABD DERMACEA PRESS 5X9 (GAUZE/BANDAGES/DRESSINGS) ×3 IMPLANT
PAD CAST CTTN 4X4 STRL (SOFTGOODS) ×1 IMPLANT
PAD GROUND ADULT SPLIT (MISCELLANEOUS) ×3 IMPLANT
PADDING CAST COTTON 4X4 STRL (SOFTGOODS) ×2
REPAIR TROPE KNTLS SS SYNDESMO (Orthopedic Implant) ×6 IMPLANT
SPONGE LAP 18X18 5 PK (GAUZE/BANDAGES/DRESSINGS) ×6 IMPLANT
STAPLER SKIN PROX 35W (STAPLE) ×3 IMPLANT
STOCKINETTE M/LG 89821 (MISCELLANEOUS) ×3 IMPLANT
STRAP SAFETY BODY (MISCELLANEOUS) ×3 IMPLANT
SUT FIBERWIRE #5 38 CONV BLUE (SUTURE) ×6
SUT ORTHOCORD W/MULTIPK NDL (SUTURE) ×3 IMPLANT
SUT STEEL 7 (SUTURE) ×3 IMPLANT
SUT VIC AB 0 CT1 27 (SUTURE) ×2
SUT VIC AB 0 CT1 27XCR 8 STRN (SUTURE) ×1 IMPLANT
SUT VIC AB 0 CT1 36 (SUTURE) ×3 IMPLANT
SUT VIC AB 2-0 CT1 27 (SUTURE) ×2
SUT VIC AB 2-0 CT1 TAPERPNT 27 (SUTURE) ×1 IMPLANT
SUTURE FIBERWR #5 38 CONV BLUE (SUTURE) ×2 IMPLANT
SYRINGE 10CC LL (SYRINGE) ×3 IMPLANT

## 2015-04-12 NOTE — Brief Op Note (Signed)
04/11/2015 - 04/12/2015  6:27 PM  PATIENT:  Melanie Huffman  81 y.o. female  PRE-OPERATIVE DIAGNOSIS:  Fractured patella left  POST-OPERATIVE DIAGNOSIS:  same  PROCEDURE:  Procedure(s): OPEN REDUCTION INTERNAL (ORIF) FIXATION PATELLA (Left)  SURGEON:  Surgeon(s) and Role:    * Hessie Knows, MD - Primary  PHYSICIAN ASSISTANT:   ASSISTANTS: none   ANESTHESIA:   spinal  EBL:  Total I/O In: -  Out: 1175 [Urine:1075; Blood:100]  BLOOD ADMINISTERED:none  DRAINS: none   LOCAL MEDICATIONS USED:  NONE  SPECIMEN:  No Specimen  DISPOSITION OF SPECIMEN:  N/A  COUNTS:  YES  TOURNIQUET:   none  DICTATION: .Dragon Dictation  PLAN OF CARE: Continue inpatient hospitalization  PATIENT DISPOSITION:  PACU - hemodynamically stable.   Delay start of Pharmacological VTE agent (>24hrs) due to surgical blood loss or risk of bleeding: not applicable

## 2015-04-12 NOTE — Transfer of Care (Signed)
Immediate Anesthesia Transfer of Care Note  Patient: Melanie Huffman  Procedure(s) Performed: Procedure(s): OPEN REDUCTION INTERNAL (ORIF) FIXATION PATELLA (Left)  Patient Location: PACU  Anesthesia Type:Spinal  Level of Consciousness: awake, alert  and oriented  Airway & Oxygen Therapy: Patient Spontanous Breathing  Post-op Assessment: Report given to RN  Post vital signs: stable  Last Vitals:  Filed Vitals:   04/12/15 1826  BP: 171/76  Pulse: 91  Temp: 36.6 C  Resp: 19    Complications: No apparent anesthesia complications

## 2015-04-12 NOTE — Anesthesia Preprocedure Evaluation (Addendum)
Anesthesia Evaluation  Patient identified by MRN, date of birth, ID band Patient awake    Reviewed: Allergy & Precautions, NPO status , Patient's Chart, lab work & pertinent test results  History of Anesthesia Complications Negative for: history of anesthetic complications  Airway Mallampati: III  TM Distance: >3 FB Neck ROM: Full    Dental no notable dental hx. (+) Upper Dentures   Pulmonary asthma , COPD COPD inhaler, former smoker,  breath sounds clear to auscultation  Pulmonary exam normal       Cardiovascular Exercise Tolerance: Good hypertension, Normal cardiovascular examRhythm:Regular Rate:Normal     Neuro/Psych negative neurological ROS  negative psych ROS   GI/Hepatic Neg liver ROS, GERD-  Medicated and Controlled,  Endo/Other  negative endocrine ROS  Renal/GU negative Renal ROS  negative genitourinary   Musculoskeletal negative musculoskeletal ROS (+)   Abdominal   Peds negative pediatric ROS (+)  Hematology negative hematology ROS (+)   Anesthesia Other Findings   Reproductive/Obstetrics negative OB ROS                            Anesthesia Physical Anesthesia Plan  ASA: III  Anesthesia Plan: Spinal   Post-op Pain Management:    Induction:   Airway Management Planned: Nasal Cannula  Additional Equipment:   Intra-op Plan:   Post-operative Plan:   Informed Consent: I have reviewed the patients History and Physical, chart, labs and discussed the procedure including the risks, benefits and alternatives for the proposed anesthesia with the patient or authorized representative who has indicated his/her understanding and acceptance.     Plan Discussed with: CRNA and Surgeon  Anesthesia Plan Comments:         Anesthesia Quick Evaluation

## 2015-04-12 NOTE — Progress Notes (Signed)
[facility] [place] NAME: Mariana Wiederholt    MR#:  [id number]  DATE OF BIRTH:  [birth year]  SUBJECTIVE:  Doing well wondering why she did not receive meds this am  PICC line placed due to poor IV access REVIEW OF SYSTEMS:    Review of Systems  Constitutional: Negative for fever and chills.  Eyes: Negative for blurred vision.  Respiratory: Negative for cough and sputum production.   Cardiovascular: Negative for chest pain, palpitations and orthopnea.  Gastrointestinal: Negative for heartburn, nausea, vomiting and abdominal pain.  Genitourinary: Negative for dysuria.  Musculoskeletal: Negative for myalgias.  Skin: Negative for rash.  Neurological: Negative for tingling, tremors and headaches.    Tolerating Diet:NPO for surgery      DRUG ALLERGIES:   Allergies  Allergen Reactions  . Codeine Nausea Only    VITALS:  Blood pressure 173/47, pulse 80, temperature 98.3 F (36.8 C), temperature source Oral, resp. rate 18, height 5\' 5"  (1.651 m), weight 54.568 kg (120 lb 4.8 oz), SpO2 92 %.  PHYSICAL EXAMINATION:   Physical Exam  Constitutional: She is oriented to person, place, and time and well-developed, well-nourished, and in no distress. No distress.  HENT:  Head: Normocephalic and atraumatic.  Cardiovascular: Normal rate, regular rhythm and normal heart sounds.  Exam reveals no friction rub.   No murmur heard. Abdominal: Soft. Bowel sounds are normal. She exhibits no distension. There is no tenderness. There is no rebound.  Musculoskeletal: Normal range of motion. She exhibits edema. She exhibits no tenderness.  Neurological: She is alert and oriented to person, place, and time.  Skin: Skin is warm.  Psychiatric: Affect normal.      LABORATORY PANEL:   CBC  Recent Labs Lab 04/11/15 1254  WBC 4.3  HGB 12.8  HCT 39.0  PLT 197    ------------------------------------------------------------------------------------------------------------------  Chemistries   Recent Labs Lab 04/11/15 1254  NA 133*  K 3.2*  CL 98*  CO2 28  GLUCOSE 137*  BUN 11  CREATININE 0.68  CALCIUM 8.8*   ------------------------------------------------------------------------------------------------------------------  Cardiac Enzymes No results for input(s): TROPONINI in the last 168 hours. ------------------------------------------------------------------------------------------------------------------  RADIOLOGY:  Dg Chest Port 1 View  04/12/2015     IMPRESSION: PICC line placed with tip over the cavoatrial junction. No pneumothorax. Chronic emphysema and fibrosis in the lungs.   Electronically Signed   By: Lucienne Capers M.D.   On: 04/12/2015 00:37   Chest Portable 1 View  04/11/2015   IMPRESSION: No radiographic evidence of acute cardiopulmonary disease, with chronic changes of emphysema.  Atherosclerosis.  Signed,  Dulcy Fanny. Earleen Newport, DO  [facility]  [facility]   Electronically Signed   By: Corrie Mckusick D.O.   On: 04/11/2015 13:37     ASSESSMENT AND PLAN:   81 year old female with a history of hypertension, left lower extremity PAD who presents after a mechanical fall with a left patella fracture. Hospitalist clearance for preoperative clearance.   1. Left patella fx: Patient to go to OR this afternoon. She may proceed without further cardiac workup.   2. Hypertension: Patient should continue on her outpatient medications including Ramilpril and HCTZ.  3. Mild hyponatremia: sodium level pending this am. I will follow up.  4. Hypokalemia: BMP pending this am  5. Chronic LEE: Patient was planned for an outpatient procedure with Dr. Delana Meyer on Tuesday. I can consult him tomorrow to see patient.  6, Hyperglycemia: Follow up on BMP  this am  7. COPD: I would continue her  outpatient inhalers. There is no evidence of acute exacerbation at this time.    Case discussed with CM for d/c planning Management plans discussed with the patient and she is in agreement.  CODE STATUS: FULL  TOTAL TIME TAKING CARE OF THIS PATIENT: 30 minutes.   POSSIBLE D/C IN 2 DAYS, DEPENDING ON CLINICAL CONDITION.   Elnora Quizon M.D on 04/12/2015 at 11:22 AM  Between 7am to 6pm - Pager - [phone] After 6pm go to [email or web address] - password [email or web address] [facility]  Office  [phone]  CC: Primary care physician; Dion Body, MD

## 2015-04-12 NOTE — Progress Notes (Signed)
CRITICAL VALUE ALERT  Critical value received:  Potassium 2.9  Date of notification:  04/12/15   Time of notification:  9447     Critical value read back:Yes.        Nurse who received alert: Verdene Rio   MD notified (1st page): Mody    Time of first page:1343   MD notified (2nd page) n/a  Time of second page: n/a  Responding MD: Benjie Karvonen   Time MD responded  [phone]

## 2015-04-12 NOTE — Anesthesia Postprocedure Evaluation (Signed)
  Anesthesia Post-op Note  Patient: Melanie Huffman  Procedure(s) Performed: Procedure(s): OPEN REDUCTION INTERNAL (ORIF) FIXATION PATELLA (Left)  Anesthesia type:Spinal  Patient location: PACU  Post pain: Pain level controlled  Post assessment: Post-op Vital signs reviewed, Patient's Cardiovascular Status Stable, Respiratory Function Stable, Patent Airway and No signs of Nausea or vomiting  Post vital signs: Reviewed and stable  Last Vitals:  Filed Vitals:   04/12/15 1912  BP:   Pulse:   Temp: 37.3 C  Resp:     Level of consciousness: awake, alert  and patient cooperative  Complications: No apparent anesthesia complications

## 2015-04-12 NOTE — Progress Notes (Signed)
Patient VSS this shift. Pain managed with iv meds. Patient voiding without difficulty. Critical value potassium 2.9 reported to Dr. Benjie Karvonen.  Oral liquid and iv potassium ordered. Pt left for surgery this afternoon for ORIF of left leg with Dr. Rudene Christians.

## 2015-04-12 NOTE — Op Note (Signed)
04/11/2015 - 04/12/2015  6:38 PM  PATIENT:  Melanie Huffman  81 y.o. female  PRE-OPERATIVE DIAGNOSIS:  Fractured patella left  POST-OPERATIVE DIAGNOSIS:  same  PROCEDURE:  Procedure(s): OPEN REDUCTION INTERNAL (ORIF) FIXATION PATELLA (Left)  SURGEON: Laurene Footman, MD  ASSISTANTS: None  ANESTHESIA:   spinal  EBL:  Total I/O In: -  Out: 1175 [Urine:1075; Blood:100]  BLOOD ADMINISTERED:none  DRAINS: none   LOCAL MEDICATIONS USED:  NONE  SPECIMEN:  No Specimen  DISPOSITION OF SPECIMEN:  N/A  COUNTS:  YES  TOURNIQUET:   none  IMPLANTS: FiberWire 2, tight rope 2  DICTATION: .Dragon Dictation patient was brought to the operating room and after adequate spinal anesthesia was obtained the patient was placed in the supine position the left leg was prepped and draped in the usual sterile manner. After patient identification and timeout procedures were completed a midline skin incision was made and the fracture site identified retinaculum was partially torn medial and lateral. the joint was irrigated out with removal blood clot. A reduction clamp was used to reapproximate the edges of the bone and 2 guidewires were inserted across the joint to appropriate positions medial and lateral. Position was checked on C-arm in both AP and lateral projections drilling was carried out and then a tight rope stainless steel anchor was passed through the hole through the tunnel with this be repeated for on both sides next a 5 FiberWire suture was passed through the distal to the anchors distally distal to the proximal anchor as well and to set as a figure-of-eight tension band.  the tight ropes were then tightened until the wrist at tension on these. And all the slack had been removed. Next the figure-of-eight #5 FiberWire sutures were tied separately removing slack after these were tied the knee was placed a range of motion and was stable although the bone was noted to be very osteoporotic permanent  C-arm views were obtained in both AP and lateral projections. The wound was irrigated and then closed with 2-0 Vicryl subcutaneously, and staples for the skin. Dressings were Xeroform 4 x 4's ABDs and web roll and Ace wrap with an OpSite dressing over previous superior lateral wound to the knee that was a chronic ulceration. Patient was then turned to recovery room in stable condition  PLAN OF CARE: Continue inpatient admission  PATIENT DISPOSITION:  PACU - hemodynamically stable.

## 2015-04-12 NOTE — Care Management Note (Signed)
Case Management Note  Patient Details  Name: Melanie Huffman MRN: [id number] Date of Birth: 1928/05/05  Subjective/Objective:                  Patient resting in bed waiting for surgical intervention to repair knee. Daughter Bari Mantis at bedside and gym instructor from [facility] also visiting with patient. Patient states she is normally independent with daily needs, drives, goes to the [facility] for exercising. She lives alone. She wants to go to SNF at discharge; CSW updated. She has a Corporate investment banker, wheelchair, but unsure about a front-wheeled rolling walker. She uses [facility] for Rx [phone].  Action/Plan: RNCM will continue to follow.   Expected Discharge Date:                  Expected Discharge Plan:     In-House Referral:  Clinical Social Work  Discharge planning Services  CM Consult  Post Acute Care Choice:    Choice offered to:  Patient, Adult Children  DME Arranged:    DME Agency:     HH Arranged:    [place] Agency:     Status of Service:     Medicare Important Message Given:  Yes Date Medicare IM Given:  04/12/15 Medicare IM give by:  Marshell Garfinkel Date Additional Medicare IM Given:    Additional Medicare Important Message give by:     If discussed at [place] of Stay Meetings, dates discussed:    Additional Comments:  Marshell Garfinkel, RN 04/12/2015, 2:13 PM

## 2015-04-12 NOTE — Progress Notes (Signed)
ANTICOAGULATION CONSULT NOTE - Follow Up Consult  Pharmacy Consult for Lovenox Indication: VTE prophylaxis  Allergies  Allergen Reactions  . Codeine Nausea Only    Patient Measurements: Height: 5\' 5"  (165.1 cm) Weight: 120 lb 4.8 oz (54.568 kg) IBW/kg (Calculated) : 57 Heparin Dosing Weight:   Vital Signs: Temp: 98.4 F (36.9 C) (05/12 1945) Temp Source: Oral (05/12 1945) BP: 155/52 mmHg (05/12 1945) Pulse Rate: 87 (05/12 1945)  Labs:  Recent Labs  04/11/15 1254 04/12/15 1147 04/12/15 1652  HGB 12.8  --  12.2  HCT 39.0  --  36  PLT 197  --   --   LABPROT 13.4  --   --   INR 1.00  --   --   CREATININE 0.68 0.47  --     Estimated Creatinine Clearance: 45.1 mL/min (by C-G formula based on Cr of 0.47).   Medications:  Scheduled:  . Calcium Citrate-Vitamin D  1 tablet Oral BID  . calcium-vitamin D  1 tablet Oral BID  .  ceFAZolin (ANCEF) IV  1 g Intravenous Q6H  . docusate sodium  100 mg Oral BID  . [START ON 04/13/2015] enoxaparin (LOVENOX) injection  30 mg Subcutaneous Q12H  . furosemide  20 mg Oral BID  . hydrochlorothiazide  25 mg Oral Daily  . mometasone-formoterol  2 puff Inhalation BID  . [START ON 04/13/2015] multivitamin with minerals   Oral Daily  . neomycin-polymyxin B      . pantoprazole  40 mg Oral Daily  . pneumococcal 23 valent vaccine  0.5 mL Intramuscular Tomorrow-1000  . potassium chloride SA  40 mEq Oral BID  . raloxifene  60 mg Oral Daily  . ramipril  20 mg Oral BID  . theophylline  100 mg Oral BID  . tiotropium  18 mcg Inhalation BID    Assessment: Post surgical pt with normal renal function, suboptimal lovenox dose   Goal of Therapy:    Plan:  Increase dose to lovenox 30 mg SQ Q12H to start on 5/13 @ 8:00.  Jayvien Rowlette D 04/12/2015,8:27 PM

## 2015-04-12 NOTE — Anesthesia Procedure Notes (Addendum)
Spinal Patient location during procedure: OR Start time: 04/12/2015 5:02 PM End time: 04/12/2015 5:04 PM Staffing Anesthesiologist: Lorane Gell Performed by: anesthesiologist  Preanesthetic Checklist Completed: patient identified, site marked, surgical consent, pre-op evaluation, timeout performed, IV checked, risks and benefits discussed and monitors and equipment checked Spinal Block Patient position: sitting Prep: ChloraPrep Patient monitoring: heart rate, continuous pulse ox and blood pressure Approach: midline Location: L4-5 Injection technique: single-shot Needle Needle type: [place]  Needle gauge: 24 G Needle length: 5 cm Assessment Sensory level: T10  Date/Time: 04/12/2015 5:00 PM Performed by: Aline Brochure Pre-anesthesia Checklist: Patient being monitored, Suction available, Emergency Drugs available and Patient identified Oxygen Delivery Method: Simple face mask Preoxygenation: Pre-oxygenation with 100% oxygen Intubation Type: IV induction

## 2015-04-13 LAB — BASIC METABOLIC PANEL
Anion gap: 3 — ABNORMAL LOW (ref 5–15)
BUN: 9 mg/dL (ref 6–20)
CHLORIDE: 100 mmol/L — AB (ref 101–111)
CO2: 31 mmol/L (ref 22–32)
Calcium: 8.1 mg/dL — ABNORMAL LOW (ref 8.9–10.3)
Creatinine, Ser: 0.59 mg/dL (ref 0.44–1.00)
GFR calc Af Amer: >60 mL/min (ref >60)
GFR calc non Af Amer: >60 mL/min (ref >60)
Glucose, Bld: 147 mg/dL — ABNORMAL HIGH (ref 65–99)
POTASSIUM: 3.4 mmol/L — AB (ref 3.5–5.1)
SODIUM: 134 mmol/L — AB (ref 135–145)

## 2015-04-13 LAB — CBC
HEMATOCRIT: 33.3 % — AB (ref 35.0–47.0)
HEMOGLOBIN: 11 g/dL — AB (ref 12.0–16.0)
MCH: 30 pg (ref 26.0–34.0)
MCHC: 33.2 g/dL (ref 32.0–36.0)
MCV: 90.3 fL (ref 80.0–100.0)
Platelets: 166 10*3/uL (ref 150–440)
RBC: 3.68 MIL/uL — ABNORMAL LOW (ref 3.80–5.20)
RDW: 13.2 % (ref 11.5–14.5)
WBC: 4.5 10*3/uL (ref 3.6–11.0)

## 2015-04-13 LAB — POTASSIUM: Potassium: 3.5 mmol/L (ref 3.5–5.1)

## 2015-04-13 MED ORDER — POTASSIUM CHLORIDE 20 MEQ PO PACK: 20.0000 meq | PACK | Freq: Two times a day (BID) | ORAL | Status: DC

## 2015-04-13 MED ORDER — POTASSIUM CHLORIDE 10 MEQ/100ML IV SOLN
10.0000 meq | INTRAVENOUS | Status: AC
  Administered 2015-04-13 (×2): 10 meq via INTRAVENOUS
  Filled 2015-04-13 (×2): qty 100

## 2015-04-13 MED ORDER — MAGNESIUM SULFATE 2 GM/50ML IV SOLN
2.0000 g | Freq: Once | INTRAVENOUS | Status: AC
  Administered 2015-04-13: 2 g via INTRAVENOUS
  Filled 2015-04-13: qty 50

## 2015-04-13 NOTE — Progress Notes (Signed)
[facility] [place] NAME: Melanie Huffman    MR#:  [id number]  DATE OF BIRTH:  [birth year]  SUBJECTIVE:  Patient suffered left patellar fracture after fall. Had surgery yesterday. Complaints of pain now. Physical therapy consult pending. REVIEW OF SYSTEMS:    Review of Systems  Constitutional: Negative for fever and chills.  Respiratory: Negative for cough, shortness of breath and wheezing.   Cardiovascular: Negative for chest pain and palpitations.  Gastrointestinal: Negative for nausea, vomiting, abdominal pain, diarrhea and constipation.  Genitourinary: Negative for dysuria.  Musculoskeletal:       Left knee pain- leg is immobilised.  Neurological: Negative for dizziness, seizures and headaches.    DRUG ALLERGIES:   Allergies  Allergen Reactions  . Codeine Nausea Only    VITALS:  Blood pressure 145/119, pulse 86, temperature 98.4 F (36.9 C), temperature source Oral, resp. rate 18, height 5\' 5"  (1.651 m), weight 54.568 kg (120 lb 4.8 oz), SpO2 98 %.  PHYSICAL EXAMINATION:   Physical Exam  Constitutional: She is oriented to person, place, and time and well-developed, well-nourished, and in no distress. No distress.  HENT:  Head: Normocephalic and atraumatic.  Cardiovascular: Normal rate, regular rhythm and normal heart sounds.  Exam reveals no friction rub.   No murmur heard. Abdominal: Soft. Bowel sounds are normal. She exhibits no distension. There is no tenderness. There is no rebound.  Musculoskeletal: Normal range of motion. She exhibits no edema or tenderness.  Left leg immobilized. Complaints of pain. Ace wrap in place for the foot. No edema noted.  Neurological: She is alert and oriented to person, place, and time.  Skin: Skin is warm.  Psychiatric: Affect normal.      LABORATORY PANEL:   CBC  Recent Labs Lab 04/13/15 0402  WBC 4.5  HGB 11.0*  HCT 33.3*  PLT 166    ------------------------------------------------------------------------------------------------------------------  Chemistries   Recent Labs Lab 04/12/15 1147  04/13/15 0402  NA 135  < > 134*  K 2.9*  < > 3.4*  CL 106  --  100*  CO2 24  --  31  GLUCOSE 85  --  147*  BUN 7  --  9  CREATININE 0.47  < > 0.59  CALCIUM 7.2*  --  8.1*  MG 1.4*  --   --   < > = values in this interval not displayed. ------------------------------------------------------------------------------------------------------------------  Cardiac Enzymes No results for input(s): TROPONINI in the last 168 hours. ------------------------------------------------------------------------------------------------------------------  RADIOLOGY:  Dg Chest Port 1 View  04/12/2015     IMPRESSION: PICC line placed with tip over the cavoatrial junction. No pneumothorax. Chronic emphysema and fibrosis in the lungs.   Electronically Signed   By: Lucienne Capers M.D.   On: 04/12/2015 00:37   Chest Portable 1 View  04/11/2015   IMPRESSION: No radiographic evidence of acute cardiopulmonary disease, with chronic changes of emphysema.  Atherosclerosis.  Signed,  Dulcy Fanny. Earleen Newport, DO  [facility]  [facility]   Electronically Signed   By: Corrie Mckusick D.O.   On: 04/11/2015 13:37     ASSESSMENT AND PLAN:   81 year old female with a history of hypertension, left lower extremity PAD who presents after a mechanical fall with a left patella fracture. Hospitalist clearance for preoperative clearance.   1. Left patella fx: Secondary to fall. Status post open reduction and internal fixation of left patella on 04/12/2015. On Pain medications. physical therapy consult pending today.  2. Hypertension: Continue ramipril at this time. Hold Lasix and head CT ZS patient hyponatremic and hypokalemic at this time. Continue to monitor and add medications as needed.  3. Mild hyponatremia:  Improving sodium level. Continue gentle hydration.  4. Hypokalemia: Potassium being replaced. Hold Lasix for now.   5. COPD: I would continue her outpatient inhalers. There is no evidence of acute exacerbation at this time.    Case discussed with CM for d/c planning. Discharge home health versus rehabilitation. Management plans discussed with the patient and she is in agreement.  CODE STATUS: FULL  TOTAL TIME TAKING CARE OF THIS PATIENT: 32 minutes.   POSSIBLE D/C IN 2 DAYS, DEPENDING ON CLINICAL CONDITION.   Gladstone Lighter M.D on 04/13/2015 at 8:20 AM  Between 7am to 6pm - Pager - [phone] After 6pm go to [email or web address] - password [email or web address] [facility]  Office  [phone]  CC: Primary care physician; Dion Body, MD

## 2015-04-13 NOTE — Clinical Social Work Note (Signed)
Clinical Social Work Assessment  Patient Details  Name: DELLAMAE ROSAMILIA MRN: [id number] Date of Birth: 06-Mar-1928  Date of referral:  04/13/15               Reason for consult:  Facility Placement                Permission sought to share information with:    Permission granted to share information::  Yes, Verbal Permission Granted  Name::      [place]::   Horace  Relationship::     Contact Information:     Housing/Transportation Living arrangements for the past 2 months:  [place] of Information:  Patient, Adult Children Patient Interpreter Needed:  None Criminal Activity/Legal Involvement Pertinent to Current Situation/Hospitalization:  No - Comment as needed Significant Relationships:  Adult Children Lives with:  Self Do you feel safe going back to the place where you live?  Yes Need for family participation in patient care:  Yes (Comment)  Care giving concerns: Patient lives alone in [place].    Social Worker assessment / plan: [place] representative (CSW) met with patient to discuss D/C plan. CSW introduced self and explained role of CSW department. Patient lives alone in the [place] at [facility]. CSW explained that PT is recommending SNF. CSW explained SNF process. Patient is agreeable to SNF search and prefers [facility]. Patient's daughter Wagman [phone] was at bedside. Patient asked if she could receive a vascular surgery from Dr. Delana Meyer while she is in the hospital. Per patient she has an appointment next Tuesday with Dr. Delana Meyer for the procedure. CSW contacted Amy with [facility] who also manages Health [facility]. Amy reported that patient will have to get a authorization from the Dr.'s office before hand and will not likely happen during this hospitalization. CSW made patient aware of above. CSW completed FL2 and sent out bed search. CSW asked Kim admissions coordinator at [facility] to  review referral. CSW will continue to follow and assist as needed.   Blima Rich, LCSWA [phone]   Employment status:  Retired Nurse, adult PT Recommendations:  [place] / Referral to community resources:  [place]  Patient/Family's Response to care: Patient lives in [place] at [facility].   Patient/Family's Understanding of and Emotional Response to Diagnosis, Current Treatment, and Prognosis: Patient and daughter are agreeable to SNF search.   Emotional Assessment Appearance:  Appears stated age Attitude/Demeanor/Rapport:    Affect (typically observed):  Calm, Accepting Orientation:  Oriented to Self, Oriented to Place, Oriented to  Time, Oriented to Situation Alcohol / Substance use:  Not Applicable Psych involvement (Current and /or in the community):  No (Comment)  Discharge Needs  Concerns to be addressed:  Discharge Planning Concerns Readmission within the last 30 days:  No Current discharge risk:  Other Barriers to Discharge:  [place], LCSW 04/13/2015, 5:40 PM

## 2015-04-13 NOTE — Evaluation (Signed)
Occupational Therapy Evaluation Patient Details Name: Melanie Huffman MRN: [id number] DOB: 1928/04/28 Today's Date: 04/13/2015    History of Present Illness 81 yo female with onset of fall and L patellar fracture with ORIF on 5/12, now with hyponatremia and hypokalemia that may have contributed.  PMHx: heart murmur, asthma, SDH, breast CA, COPD   Clinical Impression   Pt is 81 year old female s/p  L knee ORIF after a patellar fracture from falling.  Pt was independent in all ADLs prior to surgery and is eager to return to PLOF at local [facility].  Pt currently requires moderate assist for LB dressing while in seated position due to pain and limited AROM of L knee.  Pt would benefit from instruction in dressing techniques with or without assistive devices for dressing and bathing skills.  Pt would also benefit from recommendations for home modifications to increase safety in the bathroom and prevent falls. Rec patient go to SNF for continued rehab.     Follow Up Recommendations  SNF    Equipment Recommendations   (reacher and sock aid)    Recommendations for Other Services PT consult     Precautions / Restrictions Precautions Precautions: Fall Restrictions Weight Bearing Restrictions: Yes Other Position/Activity Restrictions: PWB  LLE      Mobility Bed Mobility Overal bed mobility: Needs Assistance Bed Mobility: Supine to Sit;Sit to Supine     Supine to sit: Mod assist Sit to supine: Total assist;+2 for physical assistance;+2 for safety/equipment (Pt had become light headed and needed to be assised)      Transfers Overall transfer level: Needs assistance Equipment used: Rolling walker (2 wheeled);1 person hand held assist Transfers: Sit to/from [facility] Sit to Stand: Mod assist Stand pivot transfers: Mod assist            Balance Overall balance assessment: Needs assistance Sitting-balance support: Feet supported Sitting balance-Leahy  Scale: Fair   Postural control: Posterior lean Standing balance support: Bilateral upper extremity supported Standing balance-Leahy Scale: Poor Standing balance comment: lightheaded with effort                            ADL Overall ADL's : Needs assistance/impaired Eating/Feeding: Independent   Grooming: Wash/dry hands;Wash/dry face;Oral care;Independent;Brushing hair;Applying deodorant;Set up           Upper Body Dressing : Independent   Lower Body Dressing: Moderate assistance (unable to demonstrate teach back due to pain and nauseau  and just finished with PT)   Toilet Transfer: Maximal assistance;+2 for physical assistance;+2 for safety/equipment;Cueing for sequencing;Stand-pivot;BSC             General ADL Comments: pt very limited in ADLs due to pain and weakness in BUE and BLEs     Vision     Perception     Praxis      Pertinent Vitals/Pain Pain Assessment: 0-10 Pain Score: 6  Faces Pain Scale: Hurts even more Pain Location: L knee Pain Descriptors / Indicators: Aching;Constant Pain Intervention(s): Limited activity within patient's tolerance;Monitored during session;Premedicated before session     Hand Dominance Right   Extremity/Trunk Assessment Upper Extremity Assessment Upper Extremity Assessment: Generalized weakness   Lower Extremity Assessment Lower Extremity Assessment: Defer to PT evaluation   Cervical / Trunk Assessment Cervical / Trunk Assessment: Kyphotic   Communication Communication Communication: No difficulties   Cognition Arousal/Alertness: Awake/alert Behavior During Therapy: WFL for tasks assessed/performed Overall Cognitive Status: Within Functional  Limits for tasks assessed                     General Comments       Exercises       Shoulder Instructions      Home Living Family/patient expects to be discharged to:: Other (Comment) (ILF) Living Arrangements: Other (Comment) (ILF at [facility]  (part of [place] Available Help at Discharge: Family Type of Home: Independent living facility Home Access: Level entry     Home Layout: One level;Full bath on main level     Bathroom Shower/Tub: Occupational psychologist: Standard (has toilet riser over toilet) Bathroom Accessibility: Yes How Accessible: Accessible via walker Home Equipment: [place] - 2 wheels;Cane - single point          Prior Functioning/Environment Level of Independence: Independent with assistive device(s)             OT Diagnosis: Generalized weakness;Acute pain   OT Problem List: Decreased strength;Decreased range of motion;Decreased activity tolerance;Pain   OT Treatment/Interventions: Self-care/ADL training;DME and/or AE instruction;Therapeutic activities    OT Goals(Current goals can be found in the care plan section) Acute Rehab OT Goals Patient Stated Goal: to get back to doing things for myself again OT Goal Formulation: With patient/family Time For Goal Achievement: 04/27/15 Potential to Achieve Goals: Good  OT Frequency: Min 1X/week   Barriers to D/C:            Co-evaluation              End of Session    Activity Tolerance: Patient limited by fatigue;Patient limited by pain Patient left: in bed;with call bell/phone within reach;with bed alarm set;with family/visitor present   Time: 1740-8144 OT Time Calculation (min): 29 min Charges:  OT General Charges $OT Visit: 1 Procedure OT Evaluation $Initial OT Evaluation Tier I: 1 Procedure OT Treatments $Self Care/Home Management : 8-22 mins G-Codes:    Shamirah Ivan 05-10-15, 1:15 PM    Chrys Racer, OTR/L ascom [phone]

## 2015-04-13 NOTE — Progress Notes (Signed)
PT Cancellation Note  Patient Details Name: Melanie Huffman MRN: [id number] DOB: 22-Nov-1928   Cancelled Treatment:    Reason Eval/Treat Not Completed: Patient at procedure or test/unavailable;Other (comment) (meals, asked PT to come back) once and having treatment with other staff members.     Ramond Dial 04/13/2015, 4:45 PM   Mee Hives, PT MS Acute Rehab Dept. Number: [facility] [id number] and [place] [phone]

## 2015-04-13 NOTE — Evaluation (Signed)
Physical Therapy Evaluation Patient Details Name: Melanie Huffman MRN: [id number] DOB: Jun 02, 1928 Today's Date: 04/13/2015   History of Present Illness  81 yo female with onset of fall and L patellar fracture with ORIF on 5/12, now with hyponatremia and hypokalemia that may have contributed.  PMHx: heart murmur, asthma, SDH, breast CA, COPD  Clinical Impression  Pt was seen for evaluation of her ability to walk with good response to all her work until standing for a couple minutes she became light headed.  Her PT and daughter assisted back to bed an nursing came in to help scoot her up the bed.  Pt assisted with her RLE and did give a good effort.    Follow Up Recommendations SNF    Equipment Recommendations  None recommended by PT (await disposition at SNF)    Recommendations for Other Services       Precautions / Restrictions Precautions Precautions: Fall Restrictions Weight Bearing Restrictions: Yes Other Position/Activity Restrictions: PWB      Mobility  Bed Mobility Overal bed mobility: Needs Assistance Bed Mobility: Supine to Sit;Sit to Supine     Supine to sit: Mod assist Sit to supine: Total assist;+2 for physical assistance;+2 for safety/equipment (Pt had become light headed and needed to be assised)      Transfers Overall transfer level: Needs assistance Equipment used: Rolling walker (2 wheeled);1 person hand held assist Transfers: Sit to/from [facility] Sit to Stand: Mod assist Stand pivot transfers: Mod assist          Ambulation/Gait Ambulation/Gait assistance: Min assist;Mod assist;+2 physical assistance;+2 safety/equipment Ambulation Distance (Feet): 6 Feet Assistive device: Rolling walker (2 wheeled);2 person hand held assist Gait Pattern/deviations: Decreased dorsiflexion - left;Decreased dorsiflexion - right;Wide base of support;Trunk flexed Gait velocity: reduced Gait velocity interpretation: Below normal speed for  age/gender General Gait Details: cued step through pattern and prompted PWB, but is defiinitely having trouble maintaining sequence.  Stairs            Wheelchair Mobility    Modified Rankin (Stroke Patients Only)       Balance Overall balance assessment: Needs assistance Sitting-balance support: Feet supported Sitting balance-Leahy Scale: Fair   Postural control: Posterior lean Standing balance support: Bilateral upper extremity supported Standing balance-Leahy Scale: Poor Standing balance comment: lightheaded with effort                             Pertinent Vitals/Pain Pain Assessment: Faces Pain Score: 6  Faces Pain Scale: Hurts even more Pain Location: L knee Pain Intervention(s): Limited activity within patient's tolerance;Monitored during session;Premedicated before session;Repositioned    Home Living Family/patient expects to be discharged to:: Private residence Living Arrangements: Spouse/significant other Available Help at Discharge: Family Type of Home: House         Home Equipment: Environmental consultant - 2 wheels;Cane - single point      Prior Function Level of Independence: Independent with assistive device(s)               Hand Dominance        Extremity/Trunk Assessment   Upper Extremity Assessment: Generalized weakness           Lower Extremity Assessment: Generalized weakness      Cervical / Trunk Assessment: Kyphotic  Communication   Communication: No difficulties  Cognition Arousal/Alertness: Awake/alert Behavior During Therapy: WFL for tasks assessed/performed Overall Cognitive Status: Within Functional Limits for tasks assessed  General Comments General comments (skin integrity, edema, etc.): Pt had to sit after short gait with discomfort and lightheadedness, but could maintain PWB on LLE    Exercises        Assessment/Plan    PT Assessment Patient needs continued PT services   PT Diagnosis Acute pain;Difficulty walking   PT Problem List Decreased strength;Decreased range of motion;Decreased activity tolerance;Decreased balance;Decreased mobility;Decreased coordination;Decreased knowledge of use of DME;Decreased skin integrity;Pain  PT Treatment Interventions Gait training;DME instruction;Functional mobility training;Therapeutic activities;Balance training;Therapeutic exercise;Neuromuscular re-education;Patient/family education   PT Goals (Current goals can be found in the Care Plan section) Acute Rehab PT Goals Patient Stated Goal: none stated PT Goal Formulation: With patient/family Time For Goal Achievement: 04/27/15 Potential to Achieve Goals: Good    Frequency BID   Barriers to discharge Other (comment) (requires close supervision at all times, with SNF staff best)      Co-evaluation               End of Session Equipment Utilized During Treatment: Gait belt;Oxygen Activity Tolerance: Patient limited by fatigue;Patient limited by pain Patient left: in bed;with call bell/phone within reach;with bed alarm set;with family/visitor present;with nursing/sitter in room Nurse Communication: Mobility status;Weight bearing status         Time: 4650-3546 PT Time Calculation (min) (ACUTE ONLY): 33 min   Charges:   PT Evaluation $Initial PT Evaluation Tier I: 1 Procedure PT Treatments $Gait Training: 8-22 mins   PT G Codes:        Ramond Dial 04/27/2015, 1:01 PM   Mee Hives, PT MS Acute Rehab Dept. Number: [facility] [id number] and [place] [phone]

## 2015-04-13 NOTE — Clinical Social Work Placement (Signed)
   CLINICAL SOCIAL WORK PLACEMENT  NOTE  Date:  04/13/2015  Patient Details  Name: Melanie Huffman MRN: [id number] Date of Birth: [birth year]  Clinical Social Work is seeking post-discharge placement for this patient at the [place] level of care (*CSW will initial, date and re-position this form in  chart as items are completed):  Yes   Patient/family provided with [place] Work Department's list of facilities offering this level of care within the geographic area requested by the patient (or if unable, by the patient's family).  Yes   Patient/family informed of their freedom to choose among providers that offer the needed level of care, that participate in Medicare, Medicaid or managed care program needed by the patient, have an available bed and are willing to accept the patient.      Patient/family informed of [facility]'s ownership interest in [facility] and [facility] ([facility]), as well as of the fact that they are under no obligation to receive care at these facilities.  PASRR submitted to EDS on 04/13/15     PASRR number received on 04/13/15     Existing PASRR number confirmed on       FL2 transmitted to all facilities in geographic area requested by pt/family on 04/13/15     FL2 transmitted to all facilities within larger geographic area on       Patient informed that his/her managed care company has contracts with or will negotiate with certain facilities, including the following:            Patient/family informed of bed offers received.  Patient chooses bed at       Physician recommends and patient chooses bed at      Patient to be transferred to   on  .  Patient to be transferred to facility by       Patient family notified on   of transfer.  Name of family member notified:        PHYSICIAN Please sign FL2     Additional Comment:    _______________________________________________ Loralyn Freshwater, LCSW 04/13/2015, 5:38  PM

## 2015-04-13 NOTE — Progress Notes (Addendum)
   Subjective: 1 Day Post-Op Procedure(s) (LRB): OPEN REDUCTION INTERNAL (ORIF) FIXATION PATELLA (Left) Patient reports pain as mild.   Patient is well, and has had no acute complaints or problems We will start therapy today.  Plan is to go Rehab after hospital stay.  Objective: Vital signs in last 24 hours: Temp:  [97.4 F (36.3 C)-99.1 F (37.3 C)] 98.4 F (36.9 C) (05/13 0654) Pulse Rate:  [66-98] 86 (05/13 0654) Resp:  [16-20] 18 (05/13 0654) BP: (104-173)/(47-119) 145/119 mmHg (05/13 0654) SpO2:  [92 %-98 %] 98 % (05/13 0654) FiO2 (%):  [28 %] 28 % (05/12 1950)  Intake/Output from previous day: 05/12 0701 - 05/13 0700 In: 100 [I.V.:100] Out: 1415 [Urine:1315; Blood:100] Intake/Output this shift:     Recent Labs  04/11/15 1254 04/12/15 1652 04/12/15 2045 04/13/15 0402  HGB 12.8 12.2 12.4 11.0*    Recent Labs  04/12/15 2045 04/13/15 0402  WBC 5.7 4.5  RBC 4.02 3.68*  HCT 36.4 33.3*  PLT 195 166    Recent Labs  04/12/15 1147 04/12/15 1652 04/12/15 2045 04/13/15 0402  NA 135 133*  --  134*  K 2.9* 5.0 3.5 3.4*  CL 106  --   --  100*  CO2 24  --   --  31  BUN 7  --   --  9  CREATININE 0.47  --  0.63 0.59  GLUCOSE 85  --   --  147*  CALCIUM 7.2*  --   --  8.1*    Recent Labs  04/11/15 1254  INR 1.00    EXAM General - Patient is Alert, Appropriate and Oriented Extremity - Neurologically intact Neurovascular intact Sensation intact distally Dorsiflexion/Plantar flexion intact Dressing - dressing C/D/I Motor Function - intact, moving foot and toes well on exam. Knee immobilizer intact  Past Medical History  Diagnosis Date  . Heart murmur   . Hypertension   . Asthma   . COPD (chronic obstructive pulmonary disease)   . Cancer 1991    L Breast  . Traumatic subdural hematoma     Assessment/Plan:   1 Day Post-Op Procedure(s) (LRB): OPEN REDUCTION INTERNAL (ORIF) FIXATION PATELLA (Left) Active Problems:   Patellar fracture  2  Hypokalemia 3.4  Estimated body mass index is 20.02 kg/(m^2) as calculated from the following:   Height as of this encounter: 5\' 5"  (1.651 m).   Weight as of this encounter: 54.568 kg (120 lb 4.8 oz). Advance diet Up with therapy, knee immobilizer on at all times  DVT Prophylaxis - Lovenox Weight-Bearing as tolerated to left leg D/C O2 and Pulse OX and try on Room Air Klor Kon 20 meq BID today  T. Rachelle Hora, PA-C [place] 04/13/2015, 7:32 AM

## 2015-04-13 NOTE — Progress Notes (Signed)
1900-2300 patient free from injury and falls this shift. Medicated for pain with tylenol because was concerned that hydrocodone would make her nauseous. Surgical dressing dry and intact. Knee immobilizer in place. Foley patent and draining. When patient arrived back to unit from PACU her grey port line had blood in it. Line would not flush when I tried with saline. This is nurse not sure if line was clotted before going to surgery.

## 2015-04-13 NOTE — Plan of Care (Signed)
Problem: Acute Rehab PT Goals(only PT should resolve) Goal: Pt Will Ambulate And wbing as tolerated

## 2015-04-14 LAB — BASIC METABOLIC PANEL
ANION GAP: 4 — AB (ref 5–15)
BUN: 8 mg/dL (ref 6–20)
CO2: 29 mmol/L (ref 22–32)
CREATININE: 0.6 mg/dL (ref 0.44–1.00)
Calcium: 8 mg/dL — ABNORMAL LOW (ref 8.9–10.3)
Chloride: 100 mmol/L — ABNORMAL LOW (ref 101–111)
GFR calc Af Amer: >60 mL/min (ref >60)
Glucose, Bld: 125 mg/dL — ABNORMAL HIGH (ref 65–99)
Potassium: 3.7 mmol/L (ref 3.5–5.1)
Sodium: 133 mmol/L — ABNORMAL LOW (ref 135–145)

## 2015-04-14 LAB — CBC
HEMATOCRIT: 31.9 % — AB (ref 35.0–47.0)
Hemoglobin: 11 g/dL — ABNORMAL LOW (ref 12.0–16.0)
MCH: 31 pg (ref 26.0–34.0)
MCHC: 34.4 g/dL (ref 32.0–36.0)
MCV: 90.2 fL (ref 80.0–100.0)
Platelets: 161 10*3/uL (ref 150–440)
RBC: 3.54 MIL/uL — ABNORMAL LOW (ref 3.80–5.20)
RDW: 13.2 % (ref 11.5–14.5)
WBC: 4.9 10*3/uL (ref 3.6–11.0)

## 2015-04-14 LAB — MAGNESIUM: Magnesium: 2.1 mg/dL (ref 1.7–2.4)

## 2015-04-14 NOTE — Progress Notes (Signed)
Physical Therapy Treatment Patient Details Name: Melanie Huffman MRN: [id number] DOB: Feb 18, 1928 Today's Date: 04/14/2015    History of Present Illness 81 yo female with onset of fall and L patellar fracture with ORIF on 5/12, now with hyponatremia and hypokalemia that may have contributed.  PMHx: heart murmur, asthma, SDH, breast CA, COPD    PT Comments    Pt making progress toward goals as evidenced by improved tolerance to therex and improved distance in ambulation. Pt should continue to work toward improving standing balance with LRAD and reducing LOB with full upright posture. Patient presents with impairment of strength, pain, range of motion, and activity tolerance, limiting ability to perform ADL, IADL, and ambulation. Patient will benefit from skilled intervention to address the above impairments and limitations, in order to restore to prior level of function and to decrease caregiver burden.    Follow Up Recommendations  SNF     Equipment Recommendations  Rolling walker with 5" wheels    Recommendations for Other Services       Precautions / Restrictions Precautions Precautions: Fall Required Braces or Orthoses: Knee Immobilizer - Left Knee Immobilizer - Left: On at all times Restrictions Weight Bearing Restrictions: Yes LLE Weight Bearing: Partial weight bearing    Mobility  Bed Mobility Overal bed mobility: Needs Assistance Bed Mobility: Sit to Supine       Sit to supine: Min assist (Help with LLE onto bed. )   General bed mobility comments: able to perform bridging to scoot hips to center of bed and toward HOB.   Transfers Overall transfer level: Needs assistance Equipment used: Rolling walker (2 wheeled) Transfers: Sit to/from Stand Sit to Stand: Min guard         General transfer comment: Pt showing difficulty controlling descent without flop.   Ambulation/Gait Ambulation/Gait assistance: Min assist Ambulation Distance (Feet): 20 Feet Assistive  device: Rolling walker (2 wheeled) Gait Pattern/deviations: Antalgic   Gait velocity interpretation: <1.8 ft/sec, indicative of risk for recurrent falls General Gait Details: Pt reluctant to put much weight on LLE. Pt continues to remain forward flexed, with posterior lean and walker too close to body. Presenting with multiple LOB backwards requiring minA to correct.    Stairs            Wheelchair Mobility    Modified Rankin (Stroke Patients Only)       Balance Overall balance assessment: Needs assistance Sitting-balance support: Single extremity supported Sitting balance-Leahy Scale: Good   Postural control: Posterior lean Standing balance support: Bilateral upper extremity supported Standing balance-Leahy Scale: Poor                      Cognition Arousal/Alertness: Awake/alert Behavior During Therapy: WFL for tasks assessed/performed Overall Cognitive Status: Within Functional Limits for tasks assessed                      Exercises Total Joint Exercises Ankle Circles/Pumps: AROM;Left;15 reps;Supine Hip ABduction/ADduction: AAROM;Left;15 reps;Supine Straight Leg Raises: AAROM;Left;15 reps;Supine Bridges: AROM;Strengthening;Supine;15 reps;Both (To facilitate bed mobility. )    General Comments        Pertinent Vitals/Pain Pain Assessment: 0-10 Pain Score: 4     Home Living                      Prior Function            PT Goals (current goals can now be found in the care plan section)  Acute Rehab PT Goals Patient Stated Goal: to get back to doing things for myself again PT Goal Formulation: With patient/family Time For Goal Achievement: 04/27/15 Potential to Achieve Goals: Good Progress towards PT goals: Progressing toward goals    Frequency  BID    PT Plan Current plan remains appropriate    Co-evaluation             End of Session Equipment Utilized During Treatment: Gait belt Activity Tolerance: Patient  tolerated treatment well Patient left: with call bell/phone within reach;in bed;with family/visitor present;with bed alarm set     Time: 4888-9169 PT Time Calculation (min) (ACUTE ONLY): 34 min  Charges:  $Gait Training: 23-37 mins $Therapeutic Exercise: 8-22 mins                    G Codes:      Bryana Froemming C 28-Apr-2015, 2:46 PM  Etta Grandchild, PT, DPT, BM

## 2015-04-14 NOTE — Progress Notes (Signed)
  Subjective: 2 Days Post-Op Procedure(s) (LRB): OPEN REDUCTION INTERNAL (ORIF) FIXATION PATELLA (Left) Patient reports pain as mild.   Patient seen in rounds with Dr. Rudene Christians. Patient is well, and has had no acute complaints or problems Plan is to go Skilled nursing facility after hospital stay. Negative for chest pain and shortness of breath Fever: no Gastrointestinal:negative for nausea and vomiting  Objective: Vital signs in last 24 hours: Temp:  [97.2 F (36.2 C)-100.3 F (37.9 C)] 98.1 F (36.7 C) (05/14 0027) Pulse Rate:  [88-100] 100 (05/14 0027) Resp:  [18-20] 18 (05/14 0027) BP: (118-174)/(50-105) 129/105 mmHg (05/14 0027) SpO2:  [97 %-98 %] 97 % (05/14 0027)  Intake/Output from previous day:  Intake/Output Summary (Last 24 hours) at 04/14/15 0656 Last data filed at 04/14/15 0522  Gross per 24 hour  Intake 2798.75 ml  Output    675 ml  Net 2123.75 ml    Intake/Output this shift: Total I/O In: 2558.8 [I.V.:2558.8] Out: 675 [Urine:675]  Labs:  Recent Labs  04/11/15 1254 04/12/15 1652 04/12/15 2045 04/13/15 0402 04/14/15 0551  HGB 12.8 12.2 12.4 11.0* 11.0*    Recent Labs  04/13/15 0402 04/14/15 0551  WBC 4.5 4.9  RBC 3.68* 3.54*  HCT 33.3* 31.9*  PLT 166 161    Recent Labs  04/13/15 0402 04/14/15 0551  NA 134* 133*  K 3.4* 3.7  CL 100* 100*  CO2 31 29  BUN 9 8  CREATININE 0.59 0.60  GLUCOSE 147* 125*  CALCIUM 8.1* 8.0*    Recent Labs  04/11/15 1254  INR 1.00     EXAM General - Patient is Alert and Oriented Extremity - Neurovascular intact Dorsiflexion/Plantar flexion intact Dressing/Incision - no drainage Motor Function - intact, moving foot and toes well on exam.   Past Medical History  Diagnosis Date  . Heart murmur   . Hypertension   . Asthma   . COPD (chronic obstructive pulmonary disease)   . Cancer 1991    L Breast  . Traumatic subdural hematoma     Assessment/Plan: 2 Days Post-Op Procedure(s) (LRB): OPEN  REDUCTION INTERNAL (ORIF) FIXATION PATELLA (Left) Active Problems:   Patellar fracture  Estimated body mass index is 20.02 kg/(m^2) as calculated from the following:   Height as of this encounter: 5\' 5"  (1.651 m).   Weight as of this encounter: 54.568 kg (120 lb 4.8 oz). Up with therapy D/C IV fluids   Plan for d/c to Rehab/SNF Monday  DVT Prophylaxis - Lovenox, Foot Pumps and TED hose Weight-Bearing as tolerated to Left leg  Reche Dixon, PA-C [facility] 04/14/2015, 6:56 AM

## 2015-04-14 NOTE — Progress Notes (Signed)
[place] at [place] NAME: Melanie Huffman    MR#:  [id number]  DATE OF BIRTH:  12/14/1927  SUBJECTIVE:  Patient was unable to participate with physical therapy yesterday as she was feeling dizzy. She is willing to try to work with physical therapy today. She is a very active woman. [place] the mall every [place]. REVIEW OF SYSTEMS:    Review of Systems  Constitutional: Negative for fever and chills.  HENT: Negative for tinnitus.   Respiratory: Negative for cough and hemoptysis.   Cardiovascular: Negative for chest pain.  Gastrointestinal: Negative for abdominal pain.  Genitourinary: Negative for dysuria.  Skin: Negative for rash.  Neurological: Negative for tingling and tremors.    DRUG ALLERGIES:   Allergies  Allergen Reactions  . Codeine Nausea Only    VITALS:  Blood pressure 125/70, pulse 90, temperature 97.9 F (36.6 C), temperature source Oral, resp. rate 18, height 5\' 5"  (1.651 m), weight 54.568 kg (120 lb 4.8 oz), SpO2 96 %.  PHYSICAL EXAMINATION:   Physical Exam  Constitutional: She is oriented to person, place, and time and well-developed, well-nourished, and in no distress. No distress.  HENT:  Head: Normocephalic and atraumatic.  Cardiovascular: Normal rate, regular rhythm and normal heart sounds.  Exam reveals no friction rub.   No murmur heard. Abdominal: Soft. Bowel sounds are normal. She exhibits no distension. There is no tenderness. There is no rebound.  Musculoskeletal: Normal range of motion. She exhibits no edema or tenderness.  Left leg is in an immobilizer  Neurological: She is alert and oriented to person, place, and time.  Skin: Skin is warm.  Psychiatric: Affect normal.      LABORATORY PANEL:   CBC  Recent Labs Lab 04/14/15 0551  WBC 4.9  HGB 11.0*  HCT 31.9*  PLT 161    ------------------------------------------------------------------------------------------------------------------  Chemistries   Recent Labs Lab 04/14/15 0551  NA 133*  K 3.7  CL 100*  CO2 29  GLUCOSE 125*  BUN 8  CREATININE 0.60  CALCIUM 8.0*  MG 2.1   ------------------------------------------------------------------------------------------------------------------  Cardiac Enzymes No results for input(s): TROPONINI in the last 168 hours. ------------------------------------------------------------------------------------------------------------------  RADIOLOGY:  Dg Chest Port 1 View  04/12/2015     IMPRESSION: PICC line placed with tip over the cavoatrial junction. No pneumothorax. Chronic emphysema and fibrosis in the lungs.   Electronically Signed   By: Lucienne Capers M.D.   On: 04/12/2015 00:37   Chest Portable 1 View  04/11/2015   IMPRESSION: No radiographic evidence of acute cardiopulmonary disease, with chronic changes of emphysema.  Atherosclerosis.  Signed,  Dulcy Fanny. Earleen Newport, DO  [facility]  [facility]   Electronically Signed   By: Corrie Mckusick D.O.   On: 04/11/2015 13:37     ASSESSMENT AND PLAN:   81 year old female with a history of hypertension, left lower extremity PAD who presents after a mechanical fall with a left patella fracture. Hospitalist clearance for preoperative clearance.   1. Left patella fx: Secondary to mechanical fall. She is POD #2  open reduction and internal fixation of left patella on 04/12/2015. Continue with PRN pain medications. physical therapy and ORTHO RECS.  2. Essential Hypertension: Continue ramipril at this time. Hold Lasix and HCTZ due to hyponatremic and hypokalemic at this time. Her blood pressure is controlled.  3. Mild hyponatremia:  Continue gentle hydration.  4. Hypokalemia: Improved. Lasix on hold for now.  5. COPD:  Continue her outpatient inhalers. There is no  evidence of acute exacerbation at this time.    Case discussed with CM for d/c planning. Discharge to SNF on Monday Management plans discussed with the patient and she is in agreement.  CODE STATUS: FULL  TOTAL TIME TAKING CARE OF THIS PATIENT: 22 minutes.   POSSIBLE D/C IN 2 DAYS, DEPENDING ON CLINICAL CONDITION.   Leilene Diprima M.D on 04/14/2015 at 12:00 PM  Between 7am to 6pm - Pager - [phone] After 6pm go to [email or web address] - password [email or web address] [place] [facility]  Office  [phone]  CC: Primary care physician; Dion Body, MD

## 2015-04-14 NOTE — Progress Notes (Signed)
Physical Therapy Treatment Patient Details Name: Melanie Huffman MRN: [id number] DOB: 07/21/1928 Today's Date: 04/14/2015    History of Present Illness 81 yo female with onset of fall and L patellar fracture with ORIF on 5/12, now with hyponatremia and hypokalemia that may have contributed.  PMHx: heart murmur, asthma, SDH, breast CA, COPD    PT Comments    Pt presenting in much less distress today, conversational and pleasant. Pt tolerating changes in position s c/o dizziness. Patient presents with impairment of strength, pain, range of motion, and activity tolerance, limiting ability to perform ADL, IADL, and ambulation. Patient will benefit from skilled intervention to address the above impairments and limitations, in order to restore to prior level of function and to decrease caregiver burden.    Follow Up Recommendations  SNF     Equipment Recommendations  Rolling walker with 5" wheels    Recommendations for Other Services       Precautions / Restrictions Precautions Precautions: Fall Required Braces or Orthoses: Knee Immobilizer - Left Knee Immobilizer - Left: On at all times Restrictions Weight Bearing Restrictions: Yes LLE Weight Bearing: Weight bearing as tolerated Other Position/Activity Restrictions: PWB  LLE    Mobility  Bed Mobility Overal bed mobility: Needs Assistance Bed Mobility: Supine to Sit     Supine to sit: Supervision     General bed mobility comments: able to perform bridging to scoot hips to EOB ModI   Transfers Overall transfer level: Needs assistance Equipment used: Rolling walker (2 wheeled) Transfers: Sit to/from Stand Sit to Stand: Min guard;From elevated surface         General transfer comment: Able to maintain PWB LLE   Ambulation/Gait Ambulation/Gait assistance: Min guard Ambulation Distance (Feet): 6 Feet Assistive device: Rolling walker (2 wheeled) Gait Pattern/deviations: Antalgic   Gait velocity interpretation: <1.8  ft/sec, indicative of risk for recurrent falls     Stairs            Wheelchair Mobility    Modified Rankin (Stroke Patients Only)       Balance Overall balance assessment:  (appears moderately unstable; kyphosis + RW use contributing. )                                  Cognition Arousal/Alertness: Awake/alert Behavior During Therapy: WFL for tasks assessed/performed Overall Cognitive Status: Within Functional Limits for tasks assessed                      Exercises Total Joint Exercises Straight Leg Raises: AAROM;Supine;Strengthening;15 reps;Left Bridges: AROM;Strengthening;Both;15 reps;Supine (for bed mobility)    General Comments        Pertinent Vitals/Pain Pain Assessment: 0-10 Pain Score: 3     Home Living Family/patient expects to be discharged to:: Other (Comment) Living Arrangements: Other (Comment) Available Help at Discharge: Family Type of Home: Independent living facility Home Access: Level entry   Home Layout: One level;Full bath on main level Home Equipment: Walker - 2 wheels;Cane - single point      Prior Function Level of Independence: Independent with assistive device(s)          PT Goals (current goals can now be found in the care plan section) Acute Rehab PT Goals Patient Stated Goal: to get back to doing things for myself again PT Goal Formulation: With patient/family Time For Goal Achievement: 04/27/15 Potential to Achieve Goals: Good Progress towards PT goals: Progressing  toward goals    Frequency  BID    PT Plan Current plan remains appropriate    Co-evaluation             End of Session Equipment Utilized During Treatment: Gait belt Activity Tolerance: Patient tolerated treatment well Patient left: in chair;with call bell/phone within reach;with chair alarm set;with nursing/sitter in room     Time: 7618-4859 PT Time Calculation (min) (ACUTE ONLY): 26 min  Charges:  $Therapeutic  Exercise: 8-22 mins $Therapeutic Activity: 23-37 mins                    G Codes:      Brittni Hult C May 05, 2015, 10:00 AM  Etta Grandchild, PT, DPT, BM

## 2015-04-14 NOTE — Progress Notes (Signed)
1900-0700 pt rested well. Medicated for pain x 1. Dressing intact, immobilizer in place as ordered. Turned for comfort and pressure reduction. Unable to draw blood from PICC  Line this shift. Pt voiding without difficulty.

## 2015-04-15 ENCOUNTER — Encounter: Payer: Self-pay | Admitting: Orthopedic Surgery

## 2015-04-15 LAB — CBC
HCT: 30.9 % — ABNORMAL LOW (ref 35.0–47.0)
HEMOGLOBIN: 10.6 g/dL — AB (ref 12.0–16.0)
MCH: 30.9 pg (ref 26.0–34.0)
MCHC: 34.3 g/dL (ref 32.0–36.0)
MCV: 90.3 fL (ref 80.0–100.0)
Platelets: 177 10*3/uL (ref 150–440)
RBC: 3.42 MIL/uL — ABNORMAL LOW (ref 3.80–5.20)
RDW: 13.2 % (ref 11.5–14.5)
WBC: 4.1 10*3/uL (ref 3.6–11.0)

## 2015-04-15 LAB — BASIC METABOLIC PANEL
Anion gap: 3 — ABNORMAL LOW (ref 5–15)
BUN: 11 mg/dL (ref 6–20)
CHLORIDE: 103 mmol/L (ref 101–111)
CO2: 29 mmol/L (ref 22–32)
CREATININE: 0.57 mg/dL (ref 0.44–1.00)
Calcium: 8.2 mg/dL — ABNORMAL LOW (ref 8.9–10.3)
GFR calc non Af Amer: >60 mL/min (ref >60)
GLUCOSE: 129 mg/dL — AB (ref 65–99)
POTASSIUM: 3.9 mmol/L (ref 3.5–5.1)
SODIUM: 135 mmol/L (ref 135–145)

## 2015-04-15 MED ORDER — HYDROCHLOROTHIAZIDE 25 MG PO TABS
25.0000 mg | ORAL_TABLET | Freq: Every day | ORAL | Status: DC
  Administered 2015-04-15 – 2015-04-16 (×2): 25 mg via ORAL
  Filled 2015-04-15 (×2): qty 1

## 2015-04-15 MED ORDER — POTASSIUM CHLORIDE CRYS ER 20 MEQ PO TBCR
40.0000 meq | EXTENDED_RELEASE_TABLET | Freq: Two times a day (BID) | ORAL | Status: DC
  Administered 2015-04-15 – 2015-04-16 (×2): 40 meq via ORAL
  Filled 2015-04-15 (×2): qty 2

## 2015-04-15 MED ORDER — TRAZODONE HCL 50 MG PO TABS
25.0000 mg | ORAL_TABLET | Freq: Every day | ORAL | Status: DC
  Administered 2015-04-15: 25 mg via ORAL
  Filled 2015-04-15: qty 1

## 2015-04-15 NOTE — Progress Notes (Signed)
Physical Therapy Treatment Patient Details Name: Melanie Huffman MRN: [id number] DOB: [birth year] Today's Date: 04/15/2015    History of Present Illness 81 yo female with onset of fall and L patellar fracture with ORIF on 5/12, now with hyponatremia and hypokalemia that may have contributed.  PMHx: heart murmur, asthma, SDH, breast CA, COPD    PT Comments    Patient able to increase ambulation distance today but continues to require frequent cuing for safe sequencing and min asst for occasional LOB. Will continue skilled PT services to improve independence at home and in community.  Follow Up Recommendations  SNF     Equipment Recommendations  Rolling walker with 5" wheels    Recommendations for Other Services       Precautions / Restrictions Precautions Precautions: Fall Knee Immobilizer - Left: On at all times Restrictions Weight Bearing Restrictions: Yes RLE Weight Bearing: Weight bearing as tolerated LLE Weight Bearing: Partial weight bearing    Mobility  Bed Mobility Overal bed mobility: Modified Independent (uses bedrails) Bed Mobility: Supine to Sit     Supine to sit: Supervision        Transfers Overall transfer level: Needs assistance Equipment used: Rolling walker (2 wheeled) Transfers: Sit to/from Stand Sit to Stand: Min guard (vc's to sequence and hand placement)            Ambulation/Gait Ambulation/Gait assistance: Min assist Ambulation Distance (Feet): 50 Feet Assistive device: Rolling walker (2 wheeled) Gait Pattern/deviations: Step-to pattern;Decreased step length - right;Decreased step length - left;Leaning posteriorly;Trunk flexed     General Gait Details: L step contact with forefoot only. Pt flexes trunk during  gait unless cued otherwise. Inital standing requires min A to recover posterior LOB. Patient tends to lift and place rollling walker and needs consistent cuing to move walker out from body and for step sequence. LOB x2 requiring  min asst to recover.   Stairs            Wheelchair Mobility    Modified Rankin (Stroke Patients Only)       Balance                                    Cognition Arousal/Alertness: Awake/alert Behavior During Therapy: WFL for tasks assessed/performed Overall Cognitive Status: Within Functional Limits for tasks assessed                      Exercises Other Exercises Other Exercises: AA SLR RLE; manually resisted L SAQ, knee flexion. Bilaterl ankle pumps x15.    General Comments        Pertinent Vitals/Pain Pain Assessment: 0-10 Pain Score: 5  (during ambulation, 0 at rest) Pain Location: L knee Pain Intervention(s): Premedicated before session;Monitored during session;Limited activity within patient's tolerance    Home Living                      Prior Function            PT Goals (current goals can now be found in the care plan section) Acute Rehab PT Goals Patient Stated Goal: to go to bathroom PT Goal Formulation: With patient Time For Goal Achievement: 04/27/15 Progress towards PT goals: Progressing toward goals    Frequency  BID    PT Plan Current plan remains appropriate    Co-evaluation  End of Session Equipment Utilized During Treatment: Gait belt;Left knee immobilizer Activity Tolerance: Patient tolerated treatment well Patient left: in chair;with chair alarm set;with call bell/phone within reach     Time: 1115-1145 PT Time Calculation (min) (ACUTE ONLY): 30 min  Charges:  $Gait Training: 8-22 mins $Therapeutic Exercise: 8-22 mins                    G Codes:     Violet Baldy, PT, [place] 04/15/2015, 12:08 PM

## 2015-04-15 NOTE — Discharge Planning (Signed)
INSTRUCTIONS AFTER Surgery  o Remove items at home which could result in a fall. This includes throw rugs or furniture in walking pathways o ICE to the affected joint every three hours while awake for 30 minutes at a time, for at least the first 3-5 days, and then as needed for pain and swelling.  Continue to use ice for pain and swelling. You may notice swelling that will progress down to the foot and ankle.  This is normal after surgery.  Elevate your leg when you are not up walking on it.   o Continue to use the breathing machine you got in the hospital (incentive spirometer) which will help keep your temperature down.  It is common for your temperature to cycle up and down following surgery, especially at night when you are not up moving around and exerting yourself.  The breathing machine keeps your lungs expanded and your temperature down.   DIET:  As you were doing prior to hospitalization, we recommend a well-balanced diet.  DRESSING / WOUND CARE / SHOWERING  You may change your dressing 3-5 days after surgery.  Then change the dressing every day with sterile gauze.  Please use good hand washing techniques before changing the dressing.  Do not use any lotions or creams on the incision until instructed by your surgeon.  If the dressing becomes dirty or saturated, please have the nurse change the dressing.  ACTIVITY  o Increase activity slowly as tolerated, but follow the weight bearing instructions below.   o No driving for 6 weeks or until further direction given by your physician.  You cannot drive while taking narcotics.  o No lifting or carrying greater than 10 lbs. until further directed by your surgeon. o Avoid periods of inactivity such as sitting longer than an hour when not asleep. This helps prevent blood clots.  o You may return to work once you are authorized by your doctor.     WEIGHT BEARING   Weight bearing as tolerated with assist device (walker, cane, etc) as  directed, use it as long as suggested by your surgeon or therapist, typically at least 4-6 weeks.   EXERCISES  Results after joint surgery are often greatly improved when you follow the exercise, range of motion and muscle strengthening exercises prescribed by your doctor. Safety measures are also important to protect the joint from further injury. Any time any of these exercises cause you to have increased pain or swelling, decrease what you are doing until you are comfortable again and then slowly increase them. If you have problems or questions, call your caregiver or physical therapist for advice.   Rehabilitation is important following a joint surgery. After just a few days of immobilization, the muscles of the leg can become weakened and shrink (atrophy).  These exercises are designed to build up the tone and strength of the thigh and leg muscles and to improve motion. Often times heat used for twenty to thirty minutes before working out will loosen up your tissues and help with improving the range of motion but do not use heat for the first two weeks following surgery (sometimes heat can increase post-operative swelling).   These exercises can be done on a training (exercise) mat, on the floor, on a table or on a bed. Use whatever works the best and is most comfortable for you.    Use music or television while you are exercising so that the exercises are a pleasant break in your day. This  will make your life better with the exercises acting as a break in your routine that you can look forward to.   Perform all exercises about fifteen times, three times per day or as directed.  You should exercise both the operative leg and the other leg as well.  Exercises include:   . Quad Sets - Tighten up the muscle on the front of the thigh (Quad) and hold for 5-10 seconds.   . Straight Leg Raises - With your knee straight (if you were given a brace, keep it on), lift the leg to 60 degrees, hold for 3  seconds, and slowly lower the leg.  Perform this exercise against resistance later as your leg gets stronger.  . Leg Slides: Lying on your back, slowly slide your foot toward your buttocks, bending your knee up off the floor (only go as far as is comfortable). Then slowly slide your foot back down until your leg is flat on the floor again.  Glenard Haring Wings: Lying on your back spread your legs to the side as far apart as you can without causing discomfort.  . Hamstring Strength:  Lying on your back, push your heel against the floor with your leg straight by tightening up the muscles of your buttocks.  Repeat, but this time bend your knee to a comfortable angle, and push your heel against the floor.  You may put a pillow under the heel to make it more comfortable if necessary.   A rehabilitation program following joint surgery can speed recovery and prevent re-injury in the future due to weakened muscles. Contact your doctor or a physical therapist for more information on knee rehabilitation.    CONSTIPATION  Constipation is defined medically as fewer than three stools per week and severe constipation as less than one stool per week.  Even if you have a regular bowel pattern at home, your normal regimen is likely to be disrupted due to multiple reasons following surgery.  Combination of anesthesia, postoperative narcotics, change in appetite and fluid intake all can affect your bowels.   YOU MUST use at least one of the following options; they are listed in order of increasing strength to get the job done.  They are all available over the counter, and you may need to use some, POSSIBLY even all of these options:    Drink plenty of fluids (prune juice may be helpful) and high fiber foods Colace 100 mg by mouth twice a day  Senokot for constipation as directed and as needed Dulcolax (bisacodyl), take with full glass of water  Miralax (polyethylene glycol) once or twice a day as needed.  If you have  tried all these things and are unable to have a bowel movement in the first 3-4 days after surgery call either your surgeon or your primary doctor.    If you experience loose stools or diarrhea, hold the medications until you stool forms back up.  If your symptoms do not get better within 1 week or if they get worse, check with your doctor.  If you experience "the worst abdominal pain ever" or develop nausea or vomiting, please contact the office immediately for further recommendations for treatment.   ITCHING:  If you experience itching with your medications, try taking only a single pain pill, or even half a pain pill at a time.  You can also use Benadryl over the counter for itching or also to help with sleep.   TED HOSE STOCKINGS:  Use stockings  on both legs until for at least 2 weeks or as directed by physician office. They may be removed at night for sleeping.  MEDICATIONS:  See your medication summary on the "After Visit Summary" that nursing will review with you.  You may have some home medications which will be placed on hold until you complete the course of blood thinner medication.  It is important for you to complete the blood thinner medication as prescribed.  PRECAUTIONS:  If you experience chest pain or shortness of breath - call 911 immediately for transfer to the hospital emergency department.   If you develop a fever greater that 101 F, purulent drainage from wound, increased redness or drainage from wound, foul odor from the wound/dressing, or calf pain - CONTACT YOUR SURGEON.                                                   FOLLOW-UP APPOINTMENTS:  If you do not already have a post-op appointment, please call the office for an appointment to be seen by your surgeon.  Guidelines for how soon to be seen are listed in your "After Visit Summary", but are typically between 1-4 weeks after surgery.  OTHER INSTRUCTIONS:   Keep the knee immobilizer on at all times, unless doing wound  care by the nursing staff.  No bending the knee, until cleared by Dr Rudene Christians.   MAKE SURE YOU:  . Understand these instructions.  . Get help right away if you are not doing well or get worse.    Thank you for letting us be a part of your medical care team.  It is a privilege we respect greatly.  We hope these instructions will help you stay on track for a fast and full recovery!

## 2015-04-15 NOTE — Progress Notes (Signed)
MD notified of pt wanting something for sleep. Orders placed by Jannifer Franklin, MD

## 2015-04-15 NOTE — Progress Notes (Signed)
  Subjective: 3 Days Post-Op Procedure(s) (LRB): OPEN REDUCTION INTERNAL (ORIF) FIXATION PATELLA (Left) Patient reports pain as mild.   Patient seen in rounds with Dr. Rudene Christians. Patient is well, and has had no acute complaints or problems Plan is to go Rehab after hospital stay. Negative for chest pain and shortness of breath Fever: no Gastrointestinal:negative for nausea and vomiting  Objective: Vital signs in last 24 hours: Temp:  [97.9 F (36.6 C)-98.9 F (37.2 C)] 98 F (36.7 C) (05/15 0352) Pulse Rate:  [90-98] 94 (05/15 0352) Resp:  [18] 18 (05/15 0352) BP: (121-139)/(62-70) 125/64 mmHg (05/15 0352) SpO2:  [92 %-96 %] 92 % (05/15 0352)  Intake/Output from previous day:  Intake/Output Summary (Last 24 hours) at 04/15/15 0628 Last data filed at 04/15/15 0600  Gross per 24 hour  Intake 1087.5 ml  Output    775 ml  Net  312.5 ml    Intake/Output this shift: Total I/O In: 367.5 [I.V.:367.5] Out: 300 [Urine:300]  Labs:  Recent Labs  04/12/15 1652 04/12/15 2045 04/13/15 0402 04/14/15 0551 04/15/15 0441  HGB 12.2 12.4 11.0* 11.0* 10.6*    Recent Labs  04/14/15 0551 04/15/15 0441  WBC 4.9 4.1  RBC 3.54* 3.42*  HCT 31.9* 30.9*  PLT 161 177    Recent Labs  04/14/15 0551 04/15/15 0441  NA 133* 135  K 3.7 3.9  CL 100* 103  CO2 29 29  BUN 8 11  CREATININE 0.60 0.57  GLUCOSE 125* 129*  CALCIUM 8.0* 8.2*   No results for input(s): LABPT, INR in the last 72 hours.   EXAM General - Patient is Alert and Oriented Extremity - Sensation intact distally Dorsiflexion/Plantar flexion intact Incision: scant drainage and new dressing applied today Dressing/Incision - clean, dry, healing, with mild bleeding Motor Function - intact, moving foot and toes well on exam. Knee immobilizer applied  Past Medical History  Diagnosis Date  . Heart murmur   . Hypertension   . Asthma   . COPD (chronic obstructive pulmonary disease)   . Cancer 1991    L Breast  .  Traumatic subdural hematoma     Assessment/Plan: 3 Days Post-Op Procedure(s) (LRB): OPEN REDUCTION INTERNAL (ORIF) FIXATION PATELLA (Left) Active Problems:   Patellar fracture  Estimated body mass index is 20.02 kg/(m^2) as calculated from the following:   Height as of this encounter: 5\' 5"  (1.651 m).   Weight as of this encounter: 54.568 kg (120 lb 4.8 oz). Up with therapy Plan for discharge tomorrow to SNF  DVT Prophylaxis - Lovenox and TED hose Weight-Bearing as tolerated to Right leg  Reche Dixon, PA-C Orthopaedic Surgery 04/15/2015, 6:28 AM

## 2015-04-15 NOTE — Progress Notes (Signed)
CSW provided current offers to pt for SNF. Pt has accepted offer from [facility]. Anticipate d/c Monday to [facility]. CSW will follow. Wandra Feinstein, MSW, LCSW [phone] (weekend coverage)

## 2015-04-15 NOTE — Progress Notes (Signed)
[place] [facility] [place] NAME: Melanie Huffman    MR#:  [id number]  DATE OF BIRTH:  Jul 03, 1928  SUBJECTIVE:  Patient doing well this am. no issues overnight. Worked with PT REVIEW OF SYSTEMS:    Review of Systems  Constitutional: Negative for fever.  Respiratory: Negative for cough and hemoptysis.   Cardiovascular: Negative for chest pain.  Gastrointestinal: Negative for vomiting and abdominal pain.  Genitourinary: Negative for dysuria.  Neurological: Negative for dizziness and headaches.    DRUG ALLERGIES:   Allergies  Allergen Reactions  . Codeine Nausea Only    VITALS:  Blood pressure 134/66, pulse 90, temperature 98.4 F (36.9 C), temperature source Oral, resp. rate 18, height 5\' 5"  (1.651 m), weight 54.568 kg (120 lb 4.8 oz), SpO2 92 %.  PHYSICAL EXAMINATION:   Physical Exam  Constitutional: She is oriented to person, place, and time and well-developed, well-nourished, and in no distress. No distress.  HENT:  Head: Normocephalic and atraumatic.  Cardiovascular: Normal rate, regular rhythm and normal heart sounds.  Exam reveals no friction rub.   No murmur heard. Abdominal: Soft. Bowel sounds are normal. She exhibits no distension. There is no tenderness. There is no rebound.  Musculoskeletal: Normal range of motion. She exhibits no edema or tenderness.  Left leg is in an immobilizer  Neurological: She is alert and oriented to person, place, and time.  Skin: Skin is warm.  Psychiatric: Affect normal.      LABORATORY PANEL:   CBC  Recent Labs Lab 04/15/15 0441  WBC 4.1  HGB 10.6*  HCT 30.9*  PLT 177   ------------------------------------------------------------------------------------------------------------------  Chemistries   Recent Labs Lab 04/14/15 0551 04/15/15 0441  NA 133* 135  K 3.7 3.9  CL 100* 103  CO2 29 29  GLUCOSE 125* 129*  BUN 8 11  CREATININE 0.60 0.57  CALCIUM 8.0* 8.2*  MG 2.1   --    ------------------------------------------------------------------------------------------------------------------  Cardiac Enzymes No results for input(s): TROPONINI in the last 168 hours. ------------------------------------------------------------------------------------------------------------------  RADIOLOGY:  Dg Chest Port 1 View  04/12/2015     IMPRESSION: PICC line placed with tip over the cavoatrial junction. No pneumothorax. Chronic emphysema and fibrosis in the lungs.   Electronically Signed   By: Lucienne Capers M.D.   On: 04/12/2015 00:37   Chest Portable 1 View  04/11/2015   IMPRESSION: No radiographic evidence of acute cardiopulmonary disease, with chronic changes of emphysema.  Atherosclerosis.  Signed,  Dulcy Fanny. Earleen Newport, DO  [facility]  [facility]   Electronically Signed   By: Corrie Mckusick D.O.   On: 04/11/2015 13:37     ASSESSMENT AND PLAN:   81 year old female with a history of hypertension, left lower extremity PAD who presents after a mechanical fall with a left patella fracture. Hospitalist clearance for preoperative clearance.   1. Left patella fx: Secondary to mechanical fall. She is POD #3  open reduction and internal fixation of left patella on 04/12/2015. Continue with PRN pain medications, physical therapy and ORTHO RECS. Plan is for patient to be discharged tomorrow to skilled nursing facility.  2. Essential Hypertension: Patient's blood pressure is adequately controlled. She will continue REM of her L and we will resume HCTZ.  3. Mild hyponatremia:  Her sodium level has improved. We can stop fluids.  4. Hypokalemia: Improved  5. COPD:  Continue her outpatient inhalers. There is no evidence of acute exacerbation at this time.  I  will sign off. If there are further questions please feel free to call me. Please discharge patient on all outpatient medications that she had been on before.  Case  discussed with CM for d/c planning. Discharge to SNF on Monday Management plans discussed with the patient and she is in agreement.  CODE STATUS: FULL  TOTAL TIME TAKING CARE OF THIS PATIENT: 20 minutes.   POSSIBLE D/C MONDAY DEPENDING ON CASE MANAGEMENT and insurance approval   Woodrow Dulski M.D on 04/15/2015 at 10:00 AM  Between 7am to 6pm - Pager - [phone] After 6pm go to [email or web address] - password [email or web address] [place] [facility]  Office  [phone]  CC: Primary care physician; Dion Body, MD

## 2015-04-16 ENCOUNTER — Encounter
Admission: RE | Admit: 2015-04-16 | Discharge: 2015-04-16 | Disposition: A | Discharge status: Home / Self Care | Payer: PPO | Source: Ambulatory Visit | Attending: Internal Medicine | Admitting: Internal Medicine

## 2015-04-16 MED ORDER — HYDROCODONE-ACETAMINOPHEN 5-325 MG PO TABS: 1.0000 | ORAL_TABLET | Freq: Four times a day (QID) | ORAL | Status: DC | PRN

## 2015-04-16 MED ORDER — ENOXAPARIN SODIUM 30 MG/0.3ML ~~LOC~~ SOLN: 40.0000 mg | Freq: Two times a day (BID) | SUBCUTANEOUS | Status: DC

## 2015-04-16 MED ORDER — ACETAMINOPHEN 325 MG PO TABS: 650.0000 mg | ORAL_TABLET | Freq: Four times a day (QID) | ORAL | Status: DC | PRN

## 2015-04-16 NOTE — Progress Notes (Signed)
Report called to Francene Finders, RN at [facility], Family to transport patient to [place]. Family given phone number to call upon arrival to [facility], Valuable given to family .

## 2015-04-16 NOTE — Progress Notes (Signed)
Occupational Therapy Treatment Patient Details Name: Melanie Huffman MRN: [id number] DOB: March 02, 1928 Today's Date: 04/16/2015    History of present illness 81 yo female with onset of fall and L patellar fracture with ORIF on 5/12, now with hyponatremia and hypokalemia that may have contributed.  PMHx: heart murmur, asthma, SDH, breast CA, COPD   OT comments  Pt seen for LB dressing with assistive devices in prep for going to [facility] for rehab.  Daughter states that she is being discharged and then has to come back for another procedure tomorrow for L knee.  She has L knee immobilizer on at all times and keep knee extended and PWB when standing.  Min assist to use reacher for donning underwear and pants today and mod assist for pants over hips when standing with FWW (PWB on LLE).  Min assist for helping adjust bra with prosthesis for L breast.  Pt needs cues to complete tasks herself and not demand that her daughter help do things for her.  Pt to DC to [facility] today.  Follow Up Recommendations       Equipment Recommendations   (rec  sock aid (she has a reacher at home))    Recommendations for Other Services      Precautions / Restrictions Precautions Precautions: Fall Required Braces or Orthoses: Knee Immobilizer - Left Knee Immobilizer - Left: On at all times Restrictions Weight Bearing Restrictions: Yes RLE Weight Bearing: Weight bearing as tolerated LLE Weight Bearing: Partial weight bearing       Mobility Bed Mobility                  Transfers                      Balance                                   ADL Overall ADL's : Needs assistance/impaired                 Upper Body Dressing : Minimal assistance;Set up Upper Body Dressing Details (indicate cue type and reason): min assist to hep with position of insert for L breast in bra  Lower Body Dressing: With adaptive equipment;Set up;Sit to/from stand;Moderate assistance Lower  Body Dressing Details (indicate cue type and reason): PWB with L leg immobilizer on at all times and pants over immobiizer using reacher to don underwear and pants and cues to push off from chair and not reach for FWW when going sit to stand--pt unable to pull pants over hips standing and cues for using reacher               General ADL Comments: pt making progress with use of assistive devices      Vision                     Perception     Praxis      Cognition   Behavior During Therapy: [facility] for tasks assessed/performed Overall Cognitive Status: Within Functional Limits for tasks assessed                       Extremity/Trunk Assessment               Exercises     Shoulder Instructions       General Comments      Pertinent  Vitals/ Pain       Pain Assessment: 0-10 Pain Score: 4  Pain Location: LLE Pain Descriptors / Indicators: Aching Pain Intervention(s): Monitored during session  Home Living                                          Prior Functioning/Environment              Frequency       Progress Toward Goals  OT Goals(current goals can now be found in the care plan section)  Progress towards OT goals: Progressing toward goals     Plan Discharge plan remains appropriate (Pt to go to [facility] today and then come back for another knee surgery tomorrow per daughter's report)    Co-evaluation                 End of Session Equipment Utilized During Treatment: Gait belt;Left knee immobilizer   Activity Tolerance Patient limited by fatigue;Patient limited by pain   Patient Left in chair;with call bell/phone within reach;with chair alarm set;with family/visitor present   Nurse Communication  (chair alarm off after getting dressed and daughter in room with her)        Time: 2683-4196 OT Time Calculation (min): 26 min  Charges: OT General Charges $OT Visit: 1 Procedure OT Treatments $Self  Care/Home Management : 23-37 mins  Wofford,Susan 04/16/2015, 3:13 PM    Chrys Racer, OTR/L

## 2015-04-16 NOTE — Progress Notes (Signed)
  Subjective: 4 Days Post-Op Procedure(s) (LRB): OPEN REDUCTION INTERNAL (ORIF) FIXATION PATELLA (Left) Patient reports pain as mild.   Patient seen in rounds with Dr. Rudene Christians. Patient is well, and has had no acute complaints or problems Plan is to go Skilled nursing facility after hospital stay. Negative for chest pain and shortness of breath Fever: no Gastrointestinal:negative for nausea and vomiting.  + BM  Objective: Vital signs in last 24 hours: Temp:  [97.8 F (36.6 C)-98.4 F (36.9 C)] 97.8 F (36.6 C) (05/16 0427) Pulse Rate:  [90-101] 99 (05/16 0427) Resp:  [18] 18 (05/16 0427) BP: (127-157)/(52-67) 127/52 mmHg (05/16 0427) SpO2:  [92 %-93 %] 92 % (05/16 0427)  Intake/Output from previous day:  Intake/Output Summary (Last 24 hours) at 04/16/15 0611 Last data filed at 04/16/15 0137  Gross per 24 hour  Intake    720 ml  Output   1250 ml  Net   -530 ml    Intake/Output this shift: Total I/O In: -  Out: 1000 [Urine:1000]  Labs:  Recent Labs  04/14/15 0551 04/15/15 0441  HGB 11.0* 10.6*    Recent Labs  04/14/15 0551 04/15/15 0441  WBC 4.9 4.1  RBC 3.54* 3.42*  HCT 31.9* 30.9*  PLT 161 177    Recent Labs  04/14/15 0551 04/15/15 0441  NA 133* 135  K 3.7 3.9  CL 100* 103  CO2 29 29  BUN 8 11  CREATININE 0.60 0.57  GLUCOSE 125* 129*  CALCIUM 8.0* 8.2*   No results for input(s): LABPT, INR in the last 72 hours.   EXAM General - Patient is Alert and Oriented Extremity - Neurologically intact Sensation intact distally Dressing/Incision - clean, dry Motor Function - intact, moving foot and toes well on exam.   Past Medical History  Diagnosis Date  . Heart murmur   . Hypertension   . Asthma   . COPD (chronic obstructive pulmonary disease)   . Cancer 1991    L Breast  . Traumatic subdural hematoma     Assessment/Plan: 4 Days Post-Op Procedure(s) (LRB): OPEN REDUCTION INTERNAL (ORIF) FIXATION PATELLA (Left) Active Problems:   Patellar  fracture  Estimated body mass index is 20.02 kg/(m^2) as calculated from the following:   Height as of this encounter: 5\' 5"  (1.651 m).   Weight as of this encounter: 54.568 kg (120 lb 4.8 oz). Discharge to SNF  DVT Prophylaxis - Lovenox, Foot Pumps and TED hose Weight-Bearing as tolerated to Left leg  Reche Dixon, PA-C Orthopaedic Surgery 04/16/2015, 6:11 AM

## 2015-04-16 NOTE — Progress Notes (Signed)
Discharge via wheelchair with volunteer staff and Judson Roch. Family given discharge papers to give to nurse. Left knee immobilizer in place at time of discharge.

## 2015-04-16 NOTE — Clinical Social Work Placement (Signed)
   CLINICAL SOCIAL WORK PLACEMENT  NOTE  Date:  04/16/2015  Patient Details  Name: Melanie Huffman MRN: [id number] Date of Birth: [birth year]  Clinical Social Work is seeking post-discharge placement for this patient at the [place] level of care (*CSW will initial, date and re-position this form in  chart as items are completed):  Yes   Patient/family provided with [place] Work Department's list of facilities offering this level of care within the geographic area requested by the patient (or if unable, by the patient's family).  Yes   Patient/family informed of their freedom to choose among providers that offer the needed level of care, that participate in Medicare, Medicaid or managed care program needed by the patient, have an available bed and are willing to accept the patient.      Patient/family informed of [facility]'s ownership interest in [facility] and [facility], as well as of the fact that they are under no obligation to receive care at these facilities.  PASRR submitted to EDS on 04/13/15     PASRR number received on 04/13/15     Existing PASRR number confirmed on       FL2 transmitted to all facilities in geographic area requested by pt/family on 04/13/15     FL2 transmitted to all facilities within larger geographic area on       Patient informed that his/her managed care company has contracts with or will negotiate with certain facilities, including the following:        Yes   Patient/family informed of bed offers received.  Patient chooses bed at  [facility] )     Physician recommends and patient chooses bed at      Patient to be transferred to  [facility] ) on 04/16/15.  Patient to be transferred to facility by  (Son in law in private car)     Patient family notified on 04/16/15 of transfer.  Name of family member notified:   (Daughter Francesca Jewett )     PHYSICIAN Please sign FL2     Additional Comment:     _______________________________________________ Loralyn Freshwater, LCSW 04/16/2015, 12:02 PM

## 2015-04-16 NOTE — Progress Notes (Signed)
Patient is medically stable for D/C to [facility] today. Per Amy admissions coordinator at [facility] patient is going to room 204-A. RN will call report at [phone]. Health Team authorization has been received. Auth # [id number]. Clinical Education officer, museum (CSW) prepared D/C packet and sent D/C orders to Amy via carefinder. Patient's son in law will provide transport. Patient's daughter is at bedside and aware of above. CSW made Amy at [facility] aware that patient has an appointment with vascular surgeon Dr. Erven Colla tomorrow for an outpatient procedure. Please reconsult if future social work needs arise. CSW signing off.   Blima Rich, [place] [phone]

## 2015-04-16 NOTE — Progress Notes (Signed)
Physical Therapy Treatment Patient Details Name: Melanie Huffman MRN: [id number] DOB: 02/02/1928 Today's Date: 04/16/2015    History of Present Illness 81 yo female with onset of fall and L patellar fracture with ORIF on 5/12, now with hyponatremia and hypokalemia that may have contributed.  PMHx: heart murmur, asthma, SDH, breast CA, COPD    PT Comments    Pt progressing toward all goals. Continues to require cueing for sequencing for safe ambulation.   Follow Up Recommendations  SNF     Equipment Recommendations  Rolling walker with 5" wheels    Recommendations for Other Services       Precautions / Restrictions Precautions Precautions: Fall Knee Immobilizer - Left: On at all times Restrictions Weight Bearing Restrictions: Yes RLE Weight Bearing: Weight bearing as tolerated LLE Weight Bearing: Partial weight bearing    Mobility  Bed Mobility Overal bed mobility: Modified Independent Bed Mobility: Supine to Sit     Supine to sit: Supervision        Transfers Overall transfer level: Needs assistance Equipment used: Rolling walker (2 wheeled) Transfers: Sit to/from Stand Sit to Stand: Min guard            Ambulation/Gait Ambulation/Gait assistance: Min guard;Min assist (Min A for sequencing) Ambulation Distance (Feet): 100 Feet (50 ft second walk) Assistive device: Rolling walker (2 wheeled) Gait Pattern/deviations: Step-to pattern;Decreased step length - right (Steps on ball of foot L, sequence difficulty, to far into rw)   Gait velocity interpretation: at or above normal speed for age/gender     Stairs            Wheelchair Mobility    Modified Rankin (Stroke Patients Only)       Balance                                    Cognition Arousal/Alertness: Awake/alert Behavior During Therapy: WFL for tasks assessed/performed Overall Cognitive Status: Within Functional Limits for tasks assessed                       Exercises Total Joint Exercises Ankle Circles/Pumps: AROM;Both;20 reps (long sit) Hip ABduction/ADduction: AAROM;Left;20 reps (long sit, AROM on R) Straight Leg Raises: AAROM;Left;20 reps (long sit; AROM R) General Exercises - Lower Extremity Quad Sets: Strengthening;Both;20 reps (long sit) Gluteal Sets: Strengthening;20 reps (long sit)    General Comments        Pertinent Vitals/Pain Pain Assessment: 0-10 Pain Score: 4  (Decreased to 2 by session end) Pain Location: LLE Pain Intervention(s): Monitored during session;RN gave pain meds during session    Home Living                      Prior Function            PT Goals (current goals can now be found in the care plan section) Progress towards PT goals: Progressing toward goals    Frequency  BID    PT Plan Current plan remains appropriate    Co-evaluation             End of Session Equipment Utilized During Treatment: Gait belt;Left knee immobilizer Activity Tolerance: No increased pain Patient left: in chair;with chair alarm set;with call bell/phone within reach     Time: 6720-9470 PT Time Calculation (min) (ACUTE ONLY): 39 min  Charges:  $Gait Training: 23-37 mins $Therapeutic Exercise: 8-22 mins  G Codes:      Charlaine Dalton 04/16/2015, 10:36 AM

## 2015-04-16 NOTE — Discharge Summary (Signed)
Physician Discharge Summary  Subjective: 4 Days Post-Op Procedure(s) (LRB): OPEN REDUCTION INTERNAL (ORIF) FIXATION PATELLA (Left) Patient reports pain as mild.   Patient seen in rounds with Dr. Rudene Christians. Patient is well, and has had no acute complaints or problems Patient is ready to go to [facility] today.  She has worked hard with PT and ambulated 50 feet yesterday.  No complications presently.  Needs vascular treatment of Left LE.  Objective: Vital signs in last 24 hours: Temp:  [97.8 F (36.6 C)-98.4 F (36.9 C)] 97.8 F (36.6 C) (05/16 0427) Pulse Rate:  [90-101] 99 (05/16 0427) Resp:  [18] 18 (05/16 0427) BP: (127-157)/(52-67) 127/52 mmHg (05/16 0427) SpO2:  [92 %-93 %] 92 % (05/16 0427)  Intake/Output from previous day:  Intake/Output Summary (Last 24 hours) at 04/16/15 0613 Last data filed at 04/16/15 0137  Gross per 24 hour  Intake    720 ml  Output   1250 ml  Net   -530 ml    Intake/Output this shift: Total I/O In: -  Out: 1000 [Urine:1000]  Labs:  Recent Labs  04/14/15 0551 04/15/15 0441  HGB 11.0* 10.6*    Recent Labs  04/14/15 0551 04/15/15 0441  WBC 4.9 4.1  RBC 3.54* 3.42*  HCT 31.9* 30.9*  PLT 161 177    Recent Labs  04/14/15 0551 04/15/15 0441  NA 133* 135  K 3.7 3.9  CL 100* 103  CO2 29 29  BUN 8 11  CREATININE 0.60 0.57  GLUCOSE 125* 129*  CALCIUM 8.0* 8.2*   No results for input(s): LABPT, INR in the last 72 hours.  EXAM: General - Patient is Alert and Oriented Extremity - Sensation intact distally Dorsiflexion/Plantar flexion intact Incision - clean, dry, no drainage Motor Function - intact, moving foot and toes well on exam.   Assessment/Plan: 4 Days Post-Op Procedure(s) (LRB): OPEN REDUCTION INTERNAL (ORIF) FIXATION PATELLA (Left) Procedure(s) (LRB): OPEN REDUCTION INTERNAL (ORIF) FIXATION PATELLA (Left) Past Medical History  Diagnosis Date  . Heart murmur   . Hypertension   . Asthma   . COPD (chronic  obstructive pulmonary disease)   . Cancer 1991    L Breast  . Traumatic subdural hematoma    Active Problems:   Patellar fracture  Estimated body mass index is 20.02 kg/(m^2) as calculated from the following:   Height as of this encounter: 5\' 5"  (1.651 m).   Weight as of this encounter: 54.568 kg (120 lb 4.8 oz). Discharge to SNF Diet - Regular diet Follow up - in 2 weeks Activity - WBAT with knee immobilizer on knee at all times, except when bathing or doing wound checks.  Must ambulate with knee immobilizer until discontinued by Dr Rudene Christians Disposition - Skilled nursing facility Condition Upon Discharge - Stable D/C Meds - See DC Summary DVT Prophylaxis - Lovenox and TED hose  Reche Dixon, PA-C Orthopaedic Surgery 04/16/2015, 6:13 AM

## 2015-04-16 NOTE — Discharge Instructions (Signed)
Patellar Fracture, Adult A patellar fracture is a break in your kneecap (patella).  CAUSES   A direct blow to the knee or a fall is usually the cause of a broken patella.  A very hard and strong bending of your knee can cause a patellar fracture. RISK FACTORS Involvement in contact sports, especially sports that involve a lot of jumping. SIGNS AND SYMPTOMS   Tender and swollen knee.  Pain when you move your knee, especially when you try to straighten out your leg.  Difficulty walking or putting weight on your knee.  Misshapen knee (as if a bone is out of place). DIAGNOSIS  Patellar fracture is usually diagnosed with a physical exam and an X-ray exam. TREATMENT  Treatment depends on the type of fracture:  If your patella is still in the right position after the fracture and you can still straighten your leg out, you can usually be treated with a splint or cast for 4-6 weeks.  If your patella is broken into multiple small pieces but you are able to straighten your leg, you can usually be treated with a splint or cast for 4-6 weeks. Sometimes your patella may need to be removed before the cast is applied.  If you cannot straighten out your leg after a patellar fracture, then surgery is required to hold the bony fragments together until they heal. A cast or splint will be applied for 4-6 weeks. HOME CARE INSTRUCTIONS   Only take over-the-counter or prescription medicines for pain, discomfort, or fever as directed by your health care provider.  Use crutches as directed, and exercise the leg as directed.  Apply ice to the injured area:  Put ice in a plastic bag.  Place a towel between your skin and the bag.  Leave the ice on for 20 minutes, 2-3 times a day.  Elevate the affected knee above the level of your heart. SEEK MEDICAL CARE IF:  You suspect you have significantly injured your knee.  You hear a pop after a knee injury.  Your knee is misshapen after a knee  injury.  You have pain when you move your knee.  You have difficulty walking or putting weight on your knee.  You cannot fully move your knee. SEEK IMMEDIATE MEDICAL CARE IF:  You have redness, swelling, or increasing pain in your knee.  You have a fever. Document Released: 08/16/2003 Document Revised: 09/07/2013 Document Reviewed: 06/29/2013 [facility] Patient Information 2015 [place], [place]. This information is not intended to replace advice given to you by your health care provider. Make sure you discuss any questions you have with your health care provider.

## 2015-04-17 ENCOUNTER — Encounter: Payer: Self-pay | Admitting: *Deleted

## 2015-04-17 ENCOUNTER — Ambulatory Visit
Admission: RE | Admit: 2015-04-17 | Discharge: 2015-04-17 | Disposition: A | Discharge status: Home / Self Care | Payer: PPO | Source: Ambulatory Visit | Attending: Vascular Surgery | Admitting: Vascular Surgery

## 2015-04-17 ENCOUNTER — Encounter
Admission: RE | Disposition: A | Discharge status: Home / Self Care | Payer: PPO | Source: Ambulatory Visit | Attending: Vascular Surgery

## 2015-04-17 DIAGNOSIS — I83203 Varicose veins of unspecified lower extremity with both ulcer of ankle and inflammation: Secondary | ICD-10-CM | POA: Insufficient documentation

## 2015-04-17 DIAGNOSIS — L97529 Non-pressure chronic ulcer of other part of left foot with unspecified severity: Secondary | ICD-10-CM | POA: Diagnosis not present

## 2015-04-17 DIAGNOSIS — I890 Lymphedema, not elsewhere classified: Secondary | ICD-10-CM | POA: Insufficient documentation

## 2015-04-17 DIAGNOSIS — I70293 Other atherosclerosis of native arteries of extremities, bilateral legs: Secondary | ICD-10-CM | POA: Insufficient documentation

## 2015-04-17 DIAGNOSIS — C50919 Malignant neoplasm of unspecified site of unspecified female breast: Secondary | ICD-10-CM | POA: Insufficient documentation

## 2015-04-17 DIAGNOSIS — J439 Emphysema, unspecified: Secondary | ICD-10-CM | POA: Diagnosis not present

## 2015-04-17 DIAGNOSIS — Z79899 Other long term (current) drug therapy: Secondary | ICD-10-CM | POA: Insufficient documentation

## 2015-04-17 DIAGNOSIS — I10 Essential (primary) hypertension: Secondary | ICD-10-CM | POA: Insufficient documentation

## 2015-04-17 DIAGNOSIS — I872 Venous insufficiency (chronic) (peripheral): Secondary | ICD-10-CM | POA: Insufficient documentation

## 2015-04-17 HISTORY — DX: Reserved for inherently not codable concepts without codable children: IMO0001

## 2015-04-17 HISTORY — DX: Peripheral vascular disease, unspecified: I73.9

## 2015-04-17 HISTORY — PX: PERIPHERAL VASCULAR CATHETERIZATION: SHX172C

## 2015-04-17 LAB — CREATININE, SERUM: Creatinine, Ser: 0.69 mg/dL (ref 0.44–1.00)

## 2015-04-17 LAB — BUN: BUN: 13 mg/dL (ref 6–20)

## 2015-04-17 SURGERY — LOWER EXTREMITY ANGIOGRAPHY
Anesthesia: Moderate Sedation | Laterality: Left

## 2015-04-17 MED ORDER — SODIUM CHLORIDE 0.9 % IV SOLN
INTRAVENOUS | Status: DC
  Administered 2015-04-17: 11:00:00 via INTRAVENOUS

## 2015-04-17 MED ORDER — MIDAZOLAM HCL 2 MG/2ML IJ SOLN
INTRAMUSCULAR | Status: DC | PRN
  Administered 2015-04-17: 0.5 mg via INTRAVENOUS
  Administered 2015-04-17: 1 mg via INTRAVENOUS

## 2015-04-17 MED ORDER — MIDAZOLAM HCL 5 MG/5ML IJ SOLN
INTRAMUSCULAR | Status: AC
  Filled 2015-04-17: qty 5

## 2015-04-17 MED ORDER — FENTANYL CITRATE (PF) 100 MCG/2ML IJ SOLN
INTRAMUSCULAR | Status: DC | PRN
  Administered 2015-04-17: 25 ug via INTRAVENOUS
  Administered 2015-04-17: 50 ug via INTRAVENOUS

## 2015-04-17 MED ORDER — IOHEXOL 300 MG/ML  SOLN
INTRAMUSCULAR | Status: DC | PRN
  Administered 2015-04-17: 75 mL via INTRA_ARTERIAL

## 2015-04-17 MED ORDER — SODIUM CHLORIDE 0.9 % IJ SOLN
INTRAMUSCULAR | Status: AC
  Filled 2015-04-17: qty 6

## 2015-04-17 MED ORDER — FENTANYL CITRATE (PF) 100 MCG/2ML IJ SOLN
INTRAMUSCULAR | Status: AC
  Filled 2015-04-17: qty 2

## 2015-04-17 MED ORDER — CEFAZOLIN SODIUM 1-5 GM-% IV SOLN
1.0000 g | Freq: Once | INTRAVENOUS | Status: AC
  Administered 2015-04-17: 1 g via INTRAVENOUS

## 2015-04-17 MED ORDER — HYDROCODONE-ACETAMINOPHEN 5-325 MG PO TABS
ORAL_TABLET | ORAL | Status: AC
  Administered 2015-04-17: 1
  Filled 2015-04-17: qty 1

## 2015-04-17 MED ORDER — HEPARIN SODIUM (PORCINE) 1000 UNIT/ML IJ SOLN
INTRAMUSCULAR | Status: AC
  Filled 2015-04-17: qty 1

## 2015-04-17 MED ORDER — HYDROCODONE-ACETAMINOPHEN 5-325 MG PO TABS: 1.0000 | ORAL_TABLET | Freq: Once | ORAL | Status: DC

## 2015-04-17 MED ORDER — HEPARIN SODIUM (PORCINE) 1000 UNIT/ML IJ SOLN
INTRAMUSCULAR | Status: DC | PRN
  Administered 2015-04-17: 4000 [IU] via INTRAVENOUS

## 2015-04-17 MED FILL — Neomycin-Polymyxin B GU Irrigation Soln: Qty: 4 | Status: AC

## 2015-04-17 SURGICAL SUPPLY — 18 items
BALLN ARMADA 2.5X20X150 (BALLOONS) ×4
BALLN ARMADA 2.5X60X150 (BALLOONS) ×4
BALLN ULTRVRSE 3X4X130 (BALLOONS) ×8 IMPLANT
BALLOON ARMADA 2.5X20X150 (BALLOONS) ×2 IMPLANT
BALLOON ARMADA 2.5X60X150 (BALLOONS) ×2 IMPLANT
CATH 5FX125 BOWTIP (CATHETERS) ×4 IMPLANT
CATH ROYAL FLUSH PIG 5F 70CM (CATHETERS) ×4 IMPLANT
DEVICE PRESTO INFLATION (MISCELLANEOUS) ×4 IMPLANT
DEVICE STARCLOSE SE CLOSURE (Vascular Products) ×4 IMPLANT
GLIDEWIRE ANGLED SS 035X260CM (WIRE) ×4 IMPLANT
PACK ANGIOGRAPHY (CUSTOM PROCEDURE TRAY) ×4 IMPLANT
SET INTRO CAPELLA COAXIAL (SET/KITS/TRAYS/PACK) ×4 IMPLANT
SHEATH BRITE TIP 5FRX11 (SHEATH) ×4 IMPLANT
SHEATH RAABE 6FR (SHEATH) ×4 IMPLANT
SYR MEDRAD MARK V 150ML (SYRINGE) ×4 IMPLANT
TUBING CONTRAST HIGH PRESS 72 (TUBING) ×4 IMPLANT
WIRE G V18X300CM (WIRE) ×4 IMPLANT
WIRE J 3MM .035X145CM (WIRE) ×4 IMPLANT

## 2015-04-17 NOTE — OR Nursing (Signed)
Checked with Dr. Delana Meyer to confirm that it was OK to remove knee brace during the procedure. MD aware that care home ([facility] [place]) gave lovenox  inj.this AM

## 2015-04-17 NOTE — Op Note (Signed)
[facility]  Percutaneous Study/Intervention Procedural Note   Date of Surgery: 04/10/2015 Surgeon(s):Schnier, Dolores Lory  Assistants: Pre-operative Diagnosis: Atherosclerotic occlusive disease bilateral lower extremities with ulceration of the left foot Post-operative diagnosis:  Same  Procedure(s) Performed:  1.  Introduction catheter into aorta  2.  Left lower extremity angiography third order catheter placement  3.  Percutaneous transluminal angioplasty of the tibioperoneal trunk to 3 mm  4.  Percutaneous transluminal and plasty of the posterior tibial artery to 2.5 mm    Indications:  The patient presents with tissue loss of the left foot and is at risk for limb loss.  Procedure:  Melanie Melching Whittenis a 81 y.o. female who was identified and appropriate procedural time out was performed.  The patient was then placed supine on the table and prepped and draped in the usual sterile fashion.  Ultrasound was used to evaluate the right common femoral artery.  It was patent .  A digital ultrasound image was acquired.  A Seldinger needle was used to access the right common femoral artery under direct ultrasound guidance and a permanent image was performed.  A 0.035 J wire was advanced without resistance and a 5Fr sheath was placed.    Pigtail catheter was then advanced over the wire to the level of T12 and AP projection of the aorta is obtained. Pigtail catheter was repositioned to above the bifurcation and an RAO projection of the pelvis is obtained. After review these images the pigtail catheter a stiff angled Glidewire used to cross the aortic bifurcation and the catheter is advanced down to the distal external iliac where and LAO projection of the left groin is obtained. Subsequently the wires reintroduced and the catheters negotiated into the SFA and distal runoff was obtained. Distal imaging is inadequate and the wires reintroduced and advanced down to the distal popliteal  and a straight catheter is exchanged for the pigtail catheter. Distal runoff was then completed. Review these images demonstrates a greater than 80% stenosis within the tibioperoneal trunk. There are 2 tandem lesions of greater than 80% in the posterior tibial one located proximal one third of the way down and one located in its midportion.  4000 units of heparin was given and allowed to circulate for several minutes. The stiff angle glide wires reintroduced and the straight catheter and 5 French sheath are removed and a 6 [place] rabies sheath is advanced up and over the bifurcation and positioned with its tip in the distal SFA.  The straight catheter is then reintroduced over the stiff Glidewire and the Glidewire exchanged for a VAT wire. Magnified images are then obtained of the tibioperoneal trunk and the VAT wire is negotiated through this lesion. A 3 x 4 mm ultra versed balloon is then advanced across the tibioperoneal trunk and angioplasty is performed to 14 atm for 1 minute. Follow-up imaging demonstrates an excellent result. The wires then negotiated into the distal posterior tibial. Magnified imaging localizes the 2 lesions within the posterior tibial and a 2.5 x 6 balloon is then used to perform 2 separate angioplasties. Follow-up imaging demonstrates an excellent result across these 3 areas.  The wires then removed the sheath is pulled back into the right external iliac and an oblique view of the right groin is obtained and a Starclose device is then deployed without difficulty. There are no immediate Complications.  Findings:   Aortogram:  Is widely patent as is the common and external iliac arteries bilaterally. There is minimal  calcific changes noted on fluoroscopy.  Right Lower Extremity:  Right common femoral is widely patent and the puncture appears to be in the mid portion.  Left Lower Extremity:  The left common femoral profunda femoris and superficial femoral arteries are all widely  patent with minimal calcific disease noted. Popliteal artery is also widely patent. Within the peritoneal there is a greater than 80% narrowing. The posterior tibial and peroneal bifurcation is widely patent proximal one third of the way down the posterior tibial there is a focal greater than 80% lesion and that at the midway portion of the artery there is a second lesion. The peroneal is patent throughout it's course although it does not collateralize well distally. The lateral plantar is filled directly by the posterior tibial artery which fills the pedal arch.  Following angioplasty as described above there is resolution with less than 5% residual stenosis at all 3 locations.   Disposition: Patient was taken to the recovery room in stable condition having tolerated the procedure well.  Schnier, Dolores Lory 04/10/2015 1:15 PM   04/17/2015,1:34 PM

## 2015-04-19 ENCOUNTER — Encounter: Payer: Self-pay | Admitting: Vascular Surgery

## 2015-08-27 ENCOUNTER — Other Ambulatory Visit: Payer: Self-pay | Admitting: Family Medicine

## 2015-08-27 DIAGNOSIS — Z1231 Encounter for screening mammogram for malignant neoplasm of breast: Secondary | ICD-10-CM

## 2015-08-31 ENCOUNTER — Ambulatory Visit
Admission: RE | Admit: 2015-08-31 | Discharge: 2015-08-31 | Disposition: A | Discharge status: Home / Self Care | Payer: PPO | Source: Ambulatory Visit | Attending: Family Medicine | Admitting: Family Medicine

## 2015-08-31 DIAGNOSIS — Z1231 Encounter for screening mammogram for malignant neoplasm of breast: Secondary | ICD-10-CM | POA: Insufficient documentation

## 2016-01-04 DIAGNOSIS — M79674 Pain in right toe(s): Secondary | ICD-10-CM | POA: Diagnosis not present

## 2016-01-04 DIAGNOSIS — B351 Tinea unguium: Secondary | ICD-10-CM | POA: Diagnosis not present

## 2016-01-04 DIAGNOSIS — M79675 Pain in left toe(s): Secondary | ICD-10-CM | POA: Diagnosis not present

## 2016-01-08 DIAGNOSIS — J449 Chronic obstructive pulmonary disease, unspecified: Secondary | ICD-10-CM | POA: Diagnosis not present

## 2016-02-21 DIAGNOSIS — I10 Essential (primary) hypertension: Secondary | ICD-10-CM | POA: Diagnosis not present

## 2016-02-21 DIAGNOSIS — I70211 Atherosclerosis of native arteries of extremities with intermittent claudication, right leg: Secondary | ICD-10-CM | POA: Diagnosis not present

## 2016-02-21 DIAGNOSIS — I70213 Atherosclerosis of native arteries of extremities with intermittent claudication, bilateral legs: Secondary | ICD-10-CM | POA: Diagnosis not present

## 2016-02-21 DIAGNOSIS — Z0000 Encounter for general adult medical examination without abnormal findings: Secondary | ICD-10-CM | POA: Diagnosis not present

## 2016-02-21 DIAGNOSIS — I70222 Atherosclerosis of native arteries of extremities with rest pain, left leg: Secondary | ICD-10-CM | POA: Diagnosis not present

## 2016-02-21 DIAGNOSIS — K219 Gastro-esophageal reflux disease without esophagitis: Secondary | ICD-10-CM | POA: Diagnosis not present

## 2016-02-21 DIAGNOSIS — I70248 Atherosclerosis of native arteries of left leg with ulceration of other part of lower left leg: Secondary | ICD-10-CM | POA: Diagnosis not present

## 2016-02-21 DIAGNOSIS — I739 Peripheral vascular disease, unspecified: Secondary | ICD-10-CM | POA: Diagnosis not present

## 2016-02-21 DIAGNOSIS — J449 Chronic obstructive pulmonary disease, unspecified: Secondary | ICD-10-CM | POA: Diagnosis not present

## 2016-02-28 DIAGNOSIS — Z0000 Encounter for general adult medical examination without abnormal findings: Secondary | ICD-10-CM | POA: Diagnosis not present

## 2016-02-28 DIAGNOSIS — I10 Essential (primary) hypertension: Secondary | ICD-10-CM | POA: Diagnosis not present

## 2016-03-19 ENCOUNTER — Emergency Department
Admission: EM | Admit: 2016-03-19 | Discharge: 2016-03-19 | Disposition: A | Discharge status: Home / Self Care | Payer: PPO | Attending: Emergency Medicine | Admitting: Emergency Medicine

## 2016-03-19 ENCOUNTER — Encounter: Payer: Self-pay | Admitting: *Deleted

## 2016-03-19 DIAGNOSIS — Z853 Personal history of malignant neoplasm of breast: Secondary | ICD-10-CM | POA: Diagnosis not present

## 2016-03-19 DIAGNOSIS — S0990XA Unspecified injury of head, initial encounter: Secondary | ICD-10-CM | POA: Diagnosis not present

## 2016-03-19 DIAGNOSIS — I10 Essential (primary) hypertension: Secondary | ICD-10-CM | POA: Insufficient documentation

## 2016-03-19 DIAGNOSIS — S51809A Unspecified open wound of unspecified forearm, initial encounter: Secondary | ICD-10-CM | POA: Diagnosis not present

## 2016-03-19 DIAGNOSIS — Y9259 Other trade areas as the place of occurrence of the external cause: Secondary | ICD-10-CM | POA: Insufficient documentation

## 2016-03-19 DIAGNOSIS — Y999 Unspecified external cause status: Secondary | ICD-10-CM | POA: Diagnosis not present

## 2016-03-19 DIAGNOSIS — Y9389 Activity, other specified: Secondary | ICD-10-CM | POA: Insufficient documentation

## 2016-03-19 DIAGNOSIS — J45909 Unspecified asthma, uncomplicated: Secondary | ICD-10-CM | POA: Insufficient documentation

## 2016-03-19 DIAGNOSIS — J449 Chronic obstructive pulmonary disease, unspecified: Secondary | ICD-10-CM | POA: Insufficient documentation

## 2016-03-19 DIAGNOSIS — S01111A Laceration without foreign body of right eyelid and periocular area, initial encounter: Secondary | ICD-10-CM | POA: Diagnosis not present

## 2016-03-19 DIAGNOSIS — W010XXA Fall on same level from slipping, tripping and stumbling without subsequent striking against object, initial encounter: Secondary | ICD-10-CM | POA: Diagnosis not present

## 2016-03-19 DIAGNOSIS — S0180XA Unspecified open wound of other part of head, initial encounter: Secondary | ICD-10-CM | POA: Diagnosis not present

## 2016-03-19 DIAGNOSIS — W19XXXA Unspecified fall, initial encounter: Secondary | ICD-10-CM | POA: Diagnosis not present

## 2016-03-19 DIAGNOSIS — S0191XA Laceration without foreign body of unspecified part of head, initial encounter: Secondary | ICD-10-CM | POA: Diagnosis not present

## 2016-03-19 DIAGNOSIS — Z87891 Personal history of nicotine dependence: Secondary | ICD-10-CM | POA: Diagnosis not present

## 2016-03-19 DIAGNOSIS — IMO0002 Reserved for concepts with insufficient information to code with codable children: Secondary | ICD-10-CM

## 2016-03-19 DIAGNOSIS — Z90711 Acquired absence of uterus with remaining cervical stump: Secondary | ICD-10-CM | POA: Insufficient documentation

## 2016-03-19 MED ORDER — MUPIROCIN 2 % EX OINT: TOPICAL_OINTMENT | CUTANEOUS | Status: DC

## 2016-03-19 MED ORDER — LIDOCAINE-EPINEPHRINE (PF) 1 %-1:200000 IJ SOLN
INTRAMUSCULAR | Status: AC
  Filled 2016-03-19: qty 30

## 2016-03-19 NOTE — Discharge Instructions (Signed)
Head Injury, Adult You have a head injury. Headaches and throwing up (vomiting) are common after a head injury. It should be easy to wake up from sleeping. Sometimes you must stay in the hospital. Most problems happen within the first 24 hours. Side effects may occur up to 7-10 days after the injury.  WHAT ARE THE TYPES OF HEAD INJURIES? Head injuries can be as minor as a bump. Some head injuries can be more severe. More severe head injuries include:  A jarring injury to the brain (concussion).  A bruise of the brain (contusion). This mean there is bleeding in the brain that can cause swelling.  A cracked skull (skull fracture).  Bleeding in the brain that collects, clots, and forms a bump (hematoma). WHEN SHOULD I GET HELP RIGHT AWAY?   You are confused or sleepy.  You cannot be woken up.  You feel sick to your stomach (nauseous) or keep throwing up (vomiting).  Your dizziness or unsteadiness is getting worse.  You have very bad, lasting headaches that are not helped by medicine. Take medicines only as told by your doctor.  You cannot use your arms or legs like normal.  You cannot walk.  You notice changes in the black spots in the center of the colored part of your eye (pupil).  You have clear or bloody fluid coming from your nose or ears.  You have trouble seeing. During the next 24 hours after the injury, you must stay with someone who can watch you. This person should get help right away (call 911 in the [place] if you start to shake and are not able to control it (have seizures), you pass out, or you are unable to wake up. HOW CAN I PREVENT A HEAD INJURY IN THE FUTURE?  Wear seat belts.  Wear a helmet while bike riding and playing sports like football.  Stay away from dangerous activities around the house. WHEN CAN I RETURN TO NORMAL ACTIVITIES AND ATHLETICS? See your doctor before doing these activities. You should not do normal activities or play contact sports until 1  week after the following symptoms have stopped:  Headache that does not go away.  Dizziness.  Poor attention.  Confusion.  Memory problems.  Sickness to your stomach or throwing up.  Tiredness.  Fussiness.  Bothered by bright lights or loud noises.  Anxiousness or depression.  Restless sleep. MAKE SURE YOU:   Understand these instructions.  Will watch your condition.  Will get help right away if you are not doing well or get worse.   This information is not intended to replace advice given to you by your health care provider. Make sure you discuss any questions you have with your health care provider.   Document Released: 10/30/2008 Document Revised: 12/08/2014 Document Reviewed: 07/25/2013 Elsevier Interactive Patient Education 2016 [place], Adult A laceration is a cut that goes through all of the layers of the skin and into the tissue that is right under the skin. Some lacerations heal on their own. Others need to be closed with stitches (sutures), staples, skin adhesive strips, or skin glue. Proper laceration care minimizes the risk of infection and helps the laceration to heal better. HOW TO CARE FOR YOUR LACERATION If sutures or staples were used:  Keep the wound clean and dry.  If you were given a bandage (dressing), you should change it at least one time per day or as told by your health care provider. You should also change  it if it becomes wet or dirty.  Keep the wound completely dry for the first 24 hours or as told by your health care provider. After that time, you may shower or bathe. However, make sure that the wound is not soaked in water until after the sutures or staples have been removed.  Clean the wound one time each day or as told by your health care provider:  Wash the wound with soap and water.  Rinse the wound with water to remove all soap.  Pat the wound dry with a clean towel. Do not rub the wound.  After cleaning  the wound, apply a thin layer of antibiotic ointmentas told by your health care provider. This will help to prevent infection and keep the dressing from sticking to the wound.  Have the sutures or staples removed as told by your health care provider. If skin adhesive strips were used:  Keep the wound clean and dry.  If you were given a bandage (dressing), you should change it at least one time per day or as told by your health care provider. You should also change it if it becomes dirty or wet.  Do not get the skin adhesive strips wet. You may shower or bathe, but be careful to keep the wound dry.  If the wound gets wet, pat it dry with a clean towel. Do not rub the wound.  Skin adhesive strips fall off on their own. You may trim the strips as the wound heals. Do not remove skin adhesive strips that are still stuck to the wound. They will fall off in time. If skin glue was used:  Try to keep the wound dry, but you may briefly wet it in the shower or bath. Do not soak the wound in water, such as by swimming.  After you have showered or bathed, gently pat the wound dry with a clean towel. Do not rub the wound.  Do not do any activities that will make you sweat heavily until the skin glue has fallen off on its own.  Do not apply liquid, cream, or ointment medicine to the wound while the skin glue is in place. Using those may loosen the film before the wound has healed.  If you were given a bandage (dressing), you should change it at least one time per day or as told by your health care provider. You should also change it if it becomes dirty or wet.  If a dressing is placed over the wound, be careful not to apply tape directly over the skin glue. Doing that may cause the glue to be pulled off before the wound has healed.  Do not pick at the glue. The skin glue usually remains in place for 5-10 days, then it falls off of the skin. General Instructions  Take over-the-counter and  prescription medicines only as told by your health care provider.  If you were prescribed an antibiotic medicine or ointment, take or apply it as told by your doctor. Do not stop using it even if your condition improves.  To help prevent scarring, make sure to cover your wound with sunscreen whenever you are outside after stitches are removed, after adhesive strips are removed, or when glue remains in place and the wound is healed. Make sure to wear a sunscreen of at least 30 SPF.  Do not scratch or pick at the wound.  Keep all follow-up visits as told by your health care provider. This is important.  Check  your wound every day for signs of infection. Watch for:  Redness, swelling, or pain.  Fluid, blood, or pus.  Raise (elevate) the injured area above the level of your heart while you are sitting or lying down, if possible. SEEK MEDICAL CARE IF:  You received a tetanus shot and you have swelling, severe pain, redness, or bleeding at the injection site.  You have a fever.  A wound that was closed breaks open.  You notice a bad smell coming from your wound or your dressing.  You notice something coming out of the wound, such as wood or glass.  Your pain is not controlled with medicine.  You have increased redness, swelling, or pain at the site of your wound.  You have fluid, blood, or pus coming from your wound.  You notice a change in the color of your skin near your wound.  You need to change the dressing frequently due to fluid, blood, or pus draining from the wound.  You develop a new rash.  You develop numbness around the wound. SEEK IMMEDIATE MEDICAL CARE IF:  You develop severe swelling around the wound.  Your pain suddenly increases and is severe.  You develop painful lumps near the wound or on skin that is anywhere on your body.  You have a red streak going away from your wound.  The wound is on your hand or foot and you cannot properly move a finger or  toe.  The wound is on your hand or foot and you notice that your fingers or toes look pale or bluish.   This information is not intended to replace advice given to you by your health care provider. Make sure you discuss any questions you have with your health care provider.   Document Released: 11/17/2005 Document Revised: 04/03/2015 Document Reviewed: 11/13/2014 Elsevier Interactive Patient Education [employer].

## 2016-03-19 NOTE — ED Notes (Signed)
Lido pulled for MD Williams at this time

## 2016-03-19 NOTE — ED Provider Notes (Signed)
[facility] Emergency Department Provider Note     Time seen: ----------------------------------------- 4:43 PM on 03/19/2016 -----------------------------------------    I have reviewed the triage vital signs and the nursing notes.   HISTORY  Chief Complaint Fall and Head Laceration    HPI Melanie Huffman is a 82 y.o. female who presents to ER after she fell at [employer] while shopping. Patient states she slipped and fell but is unsure while she slipped and fell. She sustained lacerations when her glasses were pressed into her face above and below the right eye. She denies any loss of consciousness, denies any headache or pain. She also sustained a skin tear to the right wrist.   Past Medical History  Diagnosis Date  . Heart murmur   . Hypertension   . Asthma   . COPD (chronic obstructive pulmonary disease)   . Cancer 1991    L Breast  . Traumatic subdural hematoma   . Shortness of breath dyspnea   . Peripheral vascular disease     Patient Active Problem List   Diagnosis Date Noted  . Patellar fracture 04/11/2015  . Subdural hematoma ([place]) 07/26/2014    Past Surgical History  Procedure Laterality Date  . Breast surgery  1991    L Mastectomy  . Tonsillectomy    . Appendectomy    . Abdominal hysterectomy      Partial  . Orif patella Left 04/12/2015    Procedure: OPEN REDUCTION INTERNAL (ORIF) FIXATION PATELLA;  Surgeon: Hessie Knows, MD;  Location: [facility] ORS;  Service: Orthopedics;  Laterality: Left;  Marland Kitchen Mastectomy    . Peripheral vascular catheterization Left 04/17/2015    Procedure: Lower Extremity Angiography;  Surgeon: Katha Cabal, MD;  Location: [place] CV LAB;  Service: Cardiovascular;  Laterality: Left;  . Peripheral vascular catheterization  04/17/2015    Procedure: Lower Extremity Intervention;  Surgeon: Katha Cabal, MD;  Location: [place] CV LAB;  Service: Cardiovascular;;  . Breast biopsy Right     1980  and 1970's    Allergies Codeine and Sulfa antibiotics  Social History Social History  Substance Use Topics  . Smoking status: Former Smoker -- 0.50 packs/day for 20 years    Types: Cigarettes    Quit date: 01/26/1979  . Smokeless tobacco: Never Used  . Alcohol Use: No    Review of Systems Constitutional: Negative for fever. Eyes: Negative for visual changes. Musculoskeletal: Negative for back pain. Skin: Positive for lacerations and skin tears Neurological: Negative for headaches, focal weakness or numbness.  10-point ROS otherwise negative.  ____________________________________________   PHYSICAL EXAM:  VITAL SIGNS: ED Triage Vitals  Enc Vitals Group     BP --      Pulse --      Resp --      Temp --      Temp src --      SpO2 --      Weight --      Height --      Head Cir --      Peak Flow --      Pain Score --      Pain Loc --      Pain Edu? --      Excl. in [place]? --     Constitutional: Alert and oriented. Well appearing and in no distress. Eyes: Conjunctivae are normal. PERRL. Normal extraocular movements. ENT   Head: There is a 2 cm laceration of the right side,, extensive  5 cm laceration linear through the right eyebrow mild bleeding is noted. Surrounding contusions are noted.   Nose: No congestion/rhinnorhea.   Mouth/Throat: Mucous membranes are moist.   Neck: No stridor. Cardiovascular: Normal rate, regular rhythm. No murmurs, rubs, or gallops. Respiratory: Normal respiratory effort without tachypnea nor retractions. Breath sounds are clear and equal bilaterally. No wheezes/rales/rhonchi. Musculoskeletal: Skin tears noted over the dorsum of the right wrist. Mild bleeding is noted Neurologic:  Normal speech and language. No gross focal neurologic deficits are appreciated.  Skin:  Skin tears and lacerations are noted above. Psychiatric: Mood and affect are normal. Speech and behavior are normal.   ____________________________________________  ED COURSE:  Pertinent labs & imaging results that were available during my care of the patient were reviewed by me and considered in my medical decision making (see chart for details). Patient is in no acute distress, will require laceration repair. LACERATION REPAIR Performed by: Earleen Newport Authorized by: Lenise Arena E Consent: Verbal consent obtained. Risks and benefits: risks, benefits and alternatives were discussed Consent given by: patient Patient identity confirmed: provided demographic data Prepped and Draped in normal sterile fashion Wound explored  Laceration Location: Right periorbital area 2  Laceration Length: 2 cm, 5 cm  No Foreign Bodies seen or palpated  Anesthesia: local infiltration  Local anesthetic: lidocaine 1 % with epinephrine  Anesthetic total: 3 ml  Irrigation method: syringe Amount of cleaning: standard  Skin closure: Dermabond, 6-0 Prolene   Number of sutures: 7   Technique: Simple interrupted   Patient tolerance: Patient tolerated the procedure well with no immediate complications.  ____________________________________________  FINAL ASSESSMENT AND PLAN  Fall, head injury, facial lacerations  Plan: Patient with a recent fall and minor head injury. Patient declined CT imaging of her head. She has no headache at this time. Wounds have been repaired, she is stable for outpatient follow-up and wound check.   Earleen Newport, MD   Earleen Newport, MD 03/19/16 [phone]

## 2016-03-19 NOTE — ED Notes (Signed)
Pt to ED from hobby lobby after mechanical fall today. Pt hit head on ground, laceration noted to right eyebrow and under right eye. Skin tear noted to right forearm. Pt AAOX4, denies LOC, does not take blood thinners. Vitals stable, bleeding controlled, NAD noted.

## 2016-03-21 DIAGNOSIS — Z4432 Encounter for fitting and adjustment of external left breast prosthesis: Secondary | ICD-10-CM | POA: Diagnosis not present

## 2016-03-21 DIAGNOSIS — C50112 Malignant neoplasm of central portion of left female breast: Secondary | ICD-10-CM | POA: Diagnosis not present

## 2016-03-25 DIAGNOSIS — Z4802 Encounter for removal of sutures: Secondary | ICD-10-CM | POA: Diagnosis not present

## 2016-03-25 DIAGNOSIS — S51811A Laceration without foreign body of right forearm, initial encounter: Secondary | ICD-10-CM | POA: Diagnosis not present

## 2016-03-25 DIAGNOSIS — S0181XA Laceration without foreign body of other part of head, initial encounter: Secondary | ICD-10-CM | POA: Diagnosis not present

## 2016-03-25 DIAGNOSIS — S01111A Laceration without foreign body of right eyelid and periocular area, initial encounter: Secondary | ICD-10-CM | POA: Diagnosis not present

## 2016-05-01 DIAGNOSIS — M79674 Pain in right toe(s): Secondary | ICD-10-CM | POA: Diagnosis not present

## 2016-05-01 DIAGNOSIS — M79675 Pain in left toe(s): Secondary | ICD-10-CM | POA: Diagnosis not present

## 2016-05-01 DIAGNOSIS — B351 Tinea unguium: Secondary | ICD-10-CM | POA: Diagnosis not present

## 2016-05-15 DIAGNOSIS — Z961 Presence of intraocular lens: Secondary | ICD-10-CM | POA: Diagnosis not present

## 2016-06-09 DIAGNOSIS — D0439 Carcinoma in situ of skin of other parts of face: Secondary | ICD-10-CM | POA: Diagnosis not present

## 2016-06-09 DIAGNOSIS — D0462 Carcinoma in situ of skin of left upper limb, including shoulder: Secondary | ICD-10-CM | POA: Diagnosis not present

## 2016-06-09 DIAGNOSIS — D485 Neoplasm of uncertain behavior of skin: Secondary | ICD-10-CM | POA: Diagnosis not present

## 2016-06-09 DIAGNOSIS — X32XXXA Exposure to sunlight, initial encounter: Secondary | ICD-10-CM | POA: Diagnosis not present

## 2016-06-09 DIAGNOSIS — D692 Other nonthrombocytopenic purpura: Secondary | ICD-10-CM | POA: Diagnosis not present

## 2016-06-09 DIAGNOSIS — L570 Actinic keratosis: Secondary | ICD-10-CM | POA: Diagnosis not present

## 2016-06-09 DIAGNOSIS — D0471 Carcinoma in situ of skin of right lower limb, including hip: Secondary | ICD-10-CM | POA: Diagnosis not present

## 2016-06-12 DIAGNOSIS — J449 Chronic obstructive pulmonary disease, unspecified: Secondary | ICD-10-CM | POA: Diagnosis not present

## 2016-06-19 DIAGNOSIS — I10 Essential (primary) hypertension: Secondary | ICD-10-CM | POA: Diagnosis not present

## 2016-06-30 DIAGNOSIS — I10 Essential (primary) hypertension: Secondary | ICD-10-CM | POA: Diagnosis not present

## 2016-06-30 DIAGNOSIS — M545 Low back pain: Secondary | ICD-10-CM | POA: Diagnosis not present

## 2016-06-30 DIAGNOSIS — E871 Hypo-osmolality and hyponatremia: Secondary | ICD-10-CM | POA: Diagnosis not present

## 2016-06-30 DIAGNOSIS — K219 Gastro-esophageal reflux disease without esophagitis: Secondary | ICD-10-CM | POA: Diagnosis not present

## 2016-07-01 ENCOUNTER — Emergency Department
Admission: EM | Admit: 2016-07-01 | Discharge: 2016-07-01 | Disposition: A | Discharge status: Home / Self Care | Payer: PPO | Attending: Emergency Medicine | Admitting: Emergency Medicine

## 2016-07-01 ENCOUNTER — Emergency Department: Payer: PPO

## 2016-07-01 ENCOUNTER — Encounter: Payer: Self-pay | Admitting: Emergency Medicine

## 2016-07-01 DIAGNOSIS — M25562 Pain in left knee: Secondary | ICD-10-CM | POA: Diagnosis not present

## 2016-07-01 DIAGNOSIS — W19XXXA Unspecified fall, initial encounter: Secondary | ICD-10-CM

## 2016-07-01 DIAGNOSIS — I10 Essential (primary) hypertension: Secondary | ICD-10-CM | POA: Insufficient documentation

## 2016-07-01 DIAGNOSIS — J45909 Unspecified asthma, uncomplicated: Secondary | ICD-10-CM | POA: Insufficient documentation

## 2016-07-01 DIAGNOSIS — M7989 Other specified soft tissue disorders: Secondary | ICD-10-CM | POA: Diagnosis not present

## 2016-07-01 DIAGNOSIS — Z853 Personal history of malignant neoplasm of breast: Secondary | ICD-10-CM | POA: Insufficient documentation

## 2016-07-01 DIAGNOSIS — S8001XA Contusion of right knee, initial encounter: Secondary | ICD-10-CM | POA: Diagnosis not present

## 2016-07-01 DIAGNOSIS — S8992XA Unspecified injury of left lower leg, initial encounter: Secondary | ICD-10-CM | POA: Diagnosis not present

## 2016-07-01 DIAGNOSIS — S8002XA Contusion of left knee, initial encounter: Secondary | ICD-10-CM | POA: Diagnosis not present

## 2016-07-01 DIAGNOSIS — Z87891 Personal history of nicotine dependence: Secondary | ICD-10-CM | POA: Insufficient documentation

## 2016-07-01 DIAGNOSIS — J449 Chronic obstructive pulmonary disease, unspecified: Secondary | ICD-10-CM | POA: Insufficient documentation

## 2016-07-01 DIAGNOSIS — R55 Syncope and collapse: Secondary | ICD-10-CM | POA: Diagnosis not present

## 2016-07-01 DIAGNOSIS — E860 Dehydration: Secondary | ICD-10-CM | POA: Insufficient documentation

## 2016-07-01 DIAGNOSIS — Z8669 Personal history of other diseases of the nervous system and sense organs: Secondary | ICD-10-CM | POA: Insufficient documentation

## 2016-07-01 DIAGNOSIS — I959 Hypotension, unspecified: Secondary | ICD-10-CM | POA: Diagnosis not present

## 2016-07-01 DIAGNOSIS — M25561 Pain in right knee: Secondary | ICD-10-CM | POA: Diagnosis not present

## 2016-07-01 LAB — CBC WITH DIFFERENTIAL/PLATELET
BASOS PCT: 0 %
Basophils Absolute: 0 10*3/uL (ref 0–0.1)
EOS ABS: 0 10*3/uL (ref 0–0.7)
EOS PCT: 0 %
HCT: 37.6 % (ref 35.0–47.0)
Hemoglobin: 13.4 g/dL (ref 12.0–16.0)
LYMPHS ABS: 0.8 10*3/uL — AB (ref 1.0–3.6)
Lymphocytes Relative: 4 %
MCH: 32.1 pg (ref 26.0–34.0)
MCHC: 35.6 g/dL (ref 32.0–36.0)
MCV: 90.2 fL (ref 80.0–100.0)
Monocytes Absolute: 0.7 10*3/uL (ref 0.2–0.9)
Monocytes Relative: 4 %
Neutro Abs: 16.1 10*3/uL — ABNORMAL HIGH (ref 1.4–6.5)
Neutrophils Relative %: 92 %
PLATELETS: 133 10*3/uL — AB (ref 150–440)
RBC: 4.17 MIL/uL (ref 3.80–5.20)
RDW: 13.4 % (ref 11.5–14.5)
WBC: 17.6 10*3/uL — AB (ref 3.6–11.0)

## 2016-07-01 LAB — COMPREHENSIVE METABOLIC PANEL
ALK PHOS: 42 U/L (ref 38–126)
ALT: 17 U/L (ref 14–54)
AST: 35 U/L (ref 15–41)
Albumin: 3.4 g/dL — ABNORMAL LOW (ref 3.5–5.0)
Anion gap: 7 (ref 5–15)
BILIRUBIN TOTAL: 0.7 mg/dL (ref 0.3–1.2)
BUN: 20 mg/dL (ref 6–20)
CALCIUM: 9 mg/dL (ref 8.9–10.3)
CHLORIDE: 95 mmol/L — AB (ref 101–111)
CO2: 25 mmol/L (ref 22–32)
CREATININE: 1.15 mg/dL — AB (ref 0.44–1.00)
GFR, EST AFRICAN AMERICAN: 49 mL/min — AB (ref >60)
GFR, EST NON AFRICAN AMERICAN: 42 mL/min — AB (ref >60)
Glucose, Bld: 114 mg/dL — ABNORMAL HIGH (ref 65–99)
Potassium: 3.6 mmol/L (ref 3.5–5.1)
Sodium: 127 mmol/L — ABNORMAL LOW (ref 135–145)
Total Protein: 6.2 g/dL — ABNORMAL LOW (ref 6.5–8.1)

## 2016-07-01 LAB — TROPONIN I: TROPONIN I: 0.04 ng/mL — AB (ref <0.03)

## 2016-07-01 MED ORDER — ACETAMINOPHEN 325 MG PO TABS
ORAL_TABLET | ORAL | Status: AC
  Administered 2016-07-01: 650 mg via ORAL
  Filled 2016-07-01: qty 2

## 2016-07-01 MED ORDER — ACETAMINOPHEN 325 MG PO TABS
650.0000 mg | ORAL_TABLET | Freq: Once | ORAL | Status: AC
Start: 2016-07-01 — End: 2016-07-01
  Administered 2016-07-01: 650 mg via ORAL

## 2016-07-01 MED ORDER — SODIUM CHLORIDE 0.9 % IV BOLUS (SEPSIS)
500.0000 mL | Freq: Once | INTRAVENOUS | Status: AC
  Administered 2016-07-01: 500 mL via INTRAVENOUS

## 2016-07-01 NOTE — Discharge Instructions (Signed)
Please drink plenty of fluids and avoid any caffeinated beverages. Avoid caffeine Please do not change any of your medications at this time. Get up and ambulate slowly. Please contact your primary physician for repeat tests if you're kidney function and sodium level. Tylenol for pain

## 2016-07-01 NOTE — ED Notes (Signed)
Pt was given saltines and juice per request while they wait. Nothing else was needed of staff by pt.

## 2016-07-01 NOTE — ED Provider Notes (Signed)
Time Seen: Approximately 1205 I have reviewed the triage notes  Chief Complaint: Hypotension   History of Present Illness: Melanie Huffman is a 82 y.o. female who presents after episode yesterday of feeling lightheaded. She apparently went to her primary physician for evaluation of some pain in the back etc. Patient states that after she left the office she felt. Patient complains of generalized weakness and had a near syncopal episode and fell mainly landing on both knees. She was noted to have low blood pressure upon arrival. She states normally her blood pressure runs high.patient denies any chest pain or shortness of breath. She denies any arm job back or flank discomfort.Pain is occurring in both knees right side worse than the left.  Past Medical History:  Diagnosis Date  . Asthma   . Cancer ([place]) 1991   L Breast  . COPD (chronic obstructive pulmonary disease) ([place])   . Heart murmur   . Hypertension   . Peripheral vascular disease ([place])   . Shortness of breath dyspnea   . Traumatic subdural hematoma [facility])     Patient Active Problem List   Diagnosis Date Noted  . Patellar fracture 04/11/2015  . Subdural hematoma ([place]) 07/26/2014    Past Surgical History:  Procedure Laterality Date  . ABDOMINAL HYSTERECTOMY     Partial  . APPENDECTOMY    . BREAST BIOPSY Right    1980 and 1970's  . BREAST SURGERY  1991   L Mastectomy  . MASTECTOMY    . ORIF PATELLA Left 04/12/2015   Procedure: OPEN REDUCTION INTERNAL (ORIF) FIXATION PATELLA;  Surgeon: Hessie Knows, MD;  Location: [facility] ORS;  Service: Orthopedics;  Laterality: Left;  . PERIPHERAL VASCULAR CATHETERIZATION Left 04/17/2015   Procedure: Lower Extremity Angiography;  Surgeon: Katha Cabal, MD;  Location: [place] CV LAB;  Service: Cardiovascular;  Laterality: Left;  . PERIPHERAL VASCULAR CATHETERIZATION  04/17/2015   Procedure: Lower Extremity Intervention;  Surgeon: Katha Cabal, MD;  Location: [place] CV  LAB;  Service: Cardiovascular;;  . TONSILLECTOMY      Past Surgical History:  Procedure Laterality Date  . ABDOMINAL HYSTERECTOMY     Partial  . APPENDECTOMY    . BREAST BIOPSY Right    1980 and 1970's  . BREAST SURGERY  1991   L Mastectomy  . MASTECTOMY    . ORIF PATELLA Left 04/12/2015   Procedure: OPEN REDUCTION INTERNAL (ORIF) FIXATION PATELLA;  Surgeon: Hessie Knows, MD;  Location: [facility] ORS;  Service: Orthopedics;  Laterality: Left;  . PERIPHERAL VASCULAR CATHETERIZATION Left 04/17/2015   Procedure: Lower Extremity Angiography;  Surgeon: Katha Cabal, MD;  Location: [place] CV LAB;  Service: Cardiovascular;  Laterality: Left;  . PERIPHERAL VASCULAR CATHETERIZATION  04/17/2015   Procedure: Lower Extremity Intervention;  Surgeon: Katha Cabal, MD;  Location: [place] CV LAB;  Service: Cardiovascular;;  . TONSILLECTOMY      Current Outpatient Rx  . Order #: [id number] Class: OTC  . Order #: [id number] Class: Historical Med  . Order #: [id number] Class: Historical Med  . Order #: [id number] Class: Historical Med  . Order #: [id number] Class: Historical Med  . Order #: [id number] Class: Historical Med  . Order #: [id number] Class: Historical Med  . Order #: [id number] Class: Historical Med  . Order #: [id number] Class: Historical Med  . Order #: [id number] Class: Historical Med  . Order #: [id number] Class: Historical Med  . Order #: [id number] Class: Historical Med  . Order #: [id number] Class: Historical Med  . Order #: [id number] Class: Historical  Med    Allergies:  Amlodipine; Codeine; and Sulfa antibiotics  Family History: History reviewed. No pertinent family history.  Social History: Social History  Substance Use Topics  . Smoking status: Former Smoker    Packs/day: 0.50    Years: 20.00    Types: Cigarettes    Quit date: 01/26/1979  . Smokeless tobacco: Never Used  . Alcohol use No     Review of Systems:   10 point review of systems was performed and was  otherwise negative:  Constitutional: No fever Eyes: No visual disturbances ENT: No sore throat, ear pain Cardiac: No chest pain Respiratory: No shortness of breath, wheezing, or stridor Abdomen: No abdominal pain, no vomiting, No diarrhea Endocrine: No weight loss, No night sweats Extremities: No peripheral edema, cyanosis Skin: No rashes, easy bruising Neurologic: No focal weakness, trouble with speech or swollowing Urologic: No dysuria, Hematuria, or urinary frequency   Physical Exam:  ED Triage Vitals [07/01/16 1200]  Enc Vitals Group     BP (!) 76/49     Pulse Rate 90     Resp 18     Temp 98.1 F (36.7 C)     Temp Source Oral     SpO2 96 %     Weight 120 lb (54.4 kg)     Height      Head Circumference      Peak Flow      Pain Score 4     Pain Loc      Pain Edu?      Excl. in [place]?     General: Awake , Alert , and Oriented times 3; GCS 15 Head: Normal cephalic , atraumatic Eyes: Pupils equal , round, reactive to light Nose/Throat: No nasal drainage, patent upper airway without erythema or exudate.  Neck: Supple, Full range of motion, No anterior adenopathy or palpable thyroid masses Lungs: Clear to ascultation without wheezes , rhonchi, or rales Heart: Regular rate, regular rhythm without murmurs , gallops , or rubs Abdomen: Soft, non tender without rebound, guarding , or rigidity; bowel sounds positive and symmetric in all 4 quadrants. No organomegaly .        Extremities: patient has contusions to both knees with some anterior swelling bilaterally without any lacerations. Neurologic: normal ambulation, Motor symmetric without deficits, sensory intact Skin: warm, dry, no rashes   Labs:   All laboratory work was reviewed including any pertinent negatives or positives listed below:  Labs Reviewed  COMPREHENSIVE METABOLIC PANEL - Abnormal; Notable for the following:       Result Value   Sodium 127 (*)    Chloride 95 (*)    Glucose, Bld 114 (*)    Creatinine,  Ser 1.15 (*)    Total Protein 6.2 (*)    Albumin 3.4 (*)    GFR calc non Af Amer 42 (*)    GFR calc Af Amer 49 (*)    All other components within normal limits  CBC WITH DIFFERENTIAL/PLATELET - Abnormal; Notable for the following:    WBC 17.6 (*)    Platelets 133 (*)    Neutro Abs 16.1 (*)    Lymphs Abs 0.8 (*)    All other components within normal limits  TROPONIN I - Abnormal; Notable for the following:    Troponin I 0.04 (*)    All other components within normal limits  URINALYSIS COMPLETEWITH MICROSCOPIC ([facility] ONLY)  TROPONIN I  review of laboratory work shows dehydration with a low sodium; elevated creatinineWhite  blood cell count is also elevated which may be due to hemoconcentration.  EKG ED ECG REPORT I, Daymon Larsen, the attending physician, personally viewed and interpreted this ECG.  Date: 07/01/2016 EKG Time: *1212 Rate: 86Rhythm: normal sinus rhythm QRS Axis: normal Intervals: normal ST/T Wave abnormalities: normal Conduction Disturbances: none Narrative Interpretation: unremarkable No acute ischemic changes   Radiology:   EXAM: LEFT KNEE - 1-2 VIEW  COMPARISON:  Apr 11, 2015  FINDINGS: Frontal and lateral views were obtained. There is postoperative change in the patella with evidence of prior fracture of the mid patella. There has been healing in this area.  There is no acute fracture or dislocation. No joint effusion. There is moderate narrowing of the patellofemoral joint. The joint spaces appear unremarkable.  IMPRESSION: Postoperative change in the patella with old healed fracture mid patella. Moderate osteoarthritic change in the patellofemoral joint noted. No acute fracture or dislocation. No appreciable joint effusion.    EXAM: RIGHT KNEE - 1-2 VIEW  COMPARISON:  August 08, 2008  FINDINGS: Frontal and lateral views were obtained. There is soft tissue swelling medially. There is no fracture or dislocation. No  joint effusion. There is slight patellofemoral joint space narrowing. There are foci of chondrocalcinosis.  IMPRESSION: Mild narrowing patellofemoral joint. Soft tissue swelling medially. No fracture or joint effusion. There is chondrocalcinosis, a finding that may be seen with osteoarthritis or with calcium pyrophosphate deposition disease.   Electronically Signed   By: Lowella Grip III M.D.   On: 07/01/2016 14:42  I personally reviewed the radiologic studies   ED Course:   Patient was given an IV fl we are still awaiting urinalysis. Her troponin is borderline elevated though with a normal EKG. Repeat troponin is currently pending. She appears to be dehydrated which would explain the near syncopal episode. She does not appear to have any significant injury to her knees at this time. Patient's second troponin was negative and I felt this was unlikely to be cardiovascular syncope Clinical Course     Assessment: * Near syncope Dehydration Bilateral knee contusions      Plan: Outpatient Encourage fluids Continue current medications Patient was advised to return immediately if condition worsens. Patient was advised to follow up with their primary care physician or other specialized physicians involved in their outpatient care. The patient and/or family member/power of attorney had laboratory results reviewed at the bedside. All questions and concerns were addressed and appropriate discharge instructions were distributed by the nursing staff.             Daymon Larsen, MD 07/01/16 [phone]

## 2016-07-01 NOTE — ED Notes (Signed)
Called the lab to check on status of urine - the lab reports that the "cannot find urine sample" - Dr Marcelene Butte notified - he stated to advise pt of this and that he would be getting her discharge paper work together

## 2016-07-04 DIAGNOSIS — L03116 Cellulitis of left lower limb: Secondary | ICD-10-CM | POA: Diagnosis not present

## 2016-07-05 DIAGNOSIS — L03116 Cellulitis of left lower limb: Secondary | ICD-10-CM | POA: Diagnosis not present

## 2016-07-07 DIAGNOSIS — I10 Essential (primary) hypertension: Secondary | ICD-10-CM | POA: Diagnosis not present

## 2016-07-09 ENCOUNTER — Inpatient Hospital Stay
Admission: EM | Admit: 2016-07-09 | Discharge: 2016-07-10 | DRG: 603 | Disposition: A | Discharge status: Home / Self Care | Payer: PPO | Attending: Internal Medicine | Admitting: Internal Medicine

## 2016-07-09 ENCOUNTER — Encounter: Payer: Self-pay | Admitting: *Deleted

## 2016-07-09 ENCOUNTER — Emergency Department: Payer: PPO

## 2016-07-09 DIAGNOSIS — Z841 Family history of disorders of kidney and ureter: Secondary | ICD-10-CM

## 2016-07-09 DIAGNOSIS — L03116 Cellulitis of left lower limb: Secondary | ICD-10-CM | POA: Diagnosis not present

## 2016-07-09 DIAGNOSIS — L0390 Cellulitis, unspecified: Secondary | ICD-10-CM | POA: Diagnosis not present

## 2016-07-09 DIAGNOSIS — I739 Peripheral vascular disease, unspecified: Secondary | ICD-10-CM | POA: Diagnosis present

## 2016-07-09 DIAGNOSIS — Z8614 Personal history of Methicillin resistant Staphylococcus aureus infection: Secondary | ICD-10-CM | POA: Diagnosis not present

## 2016-07-09 DIAGNOSIS — I10 Essential (primary) hypertension: Secondary | ICD-10-CM | POA: Diagnosis present

## 2016-07-09 DIAGNOSIS — E876 Hypokalemia: Secondary | ICD-10-CM | POA: Diagnosis not present

## 2016-07-09 DIAGNOSIS — J449 Chronic obstructive pulmonary disease, unspecified: Secondary | ICD-10-CM | POA: Diagnosis present

## 2016-07-09 DIAGNOSIS — Z853 Personal history of malignant neoplasm of breast: Secondary | ICD-10-CM

## 2016-07-09 DIAGNOSIS — E860 Dehydration: Secondary | ICD-10-CM | POA: Diagnosis present

## 2016-07-09 DIAGNOSIS — N289 Disorder of kidney and ureter, unspecified: Secondary | ICD-10-CM | POA: Diagnosis present

## 2016-07-09 DIAGNOSIS — E871 Hypo-osmolality and hyponatremia: Secondary | ICD-10-CM | POA: Diagnosis present

## 2016-07-09 DIAGNOSIS — Z87891 Personal history of nicotine dependence: Secondary | ICD-10-CM | POA: Diagnosis not present

## 2016-07-09 DIAGNOSIS — K219 Gastro-esophageal reflux disease without esophagitis: Secondary | ICD-10-CM | POA: Diagnosis present

## 2016-07-09 DIAGNOSIS — Z8249 Family history of ischemic heart disease and other diseases of the circulatory system: Secondary | ICD-10-CM | POA: Diagnosis not present

## 2016-07-09 DIAGNOSIS — Z823 Family history of stroke: Secondary | ICD-10-CM

## 2016-07-09 DIAGNOSIS — M7989 Other specified soft tissue disorders: Secondary | ICD-10-CM | POA: Diagnosis not present

## 2016-07-09 LAB — CBC
HEMATOCRIT: 38.3 % (ref 35.0–47.0)
HEMOGLOBIN: 13.7 g/dL (ref 12.0–16.0)
MCH: 32.1 pg (ref 26.0–34.0)
MCHC: 35.6 g/dL (ref 32.0–36.0)
MCV: 90.1 fL (ref 80.0–100.0)
Platelets: 274 10*3/uL (ref 150–440)
RBC: 4.26 MIL/uL (ref 3.80–5.20)
RDW: 13.6 % (ref 11.5–14.5)
WBC: 6.3 10*3/uL (ref 3.6–11.0)

## 2016-07-09 LAB — CREATININE, SERUM
CREATININE: 0.56 mg/dL (ref 0.44–1.00)
GFR calc non Af Amer: >60 mL/min (ref >60)

## 2016-07-09 MED ORDER — RAMIPRIL 5 MG PO CAPS
20.0000 mg | ORAL_CAPSULE | Freq: Every day | ORAL | Status: DC
  Administered 2016-07-10: 20 mg via ORAL
  Filled 2016-07-09: qty 4

## 2016-07-09 MED ORDER — PANTOPRAZOLE SODIUM 40 MG PO TBEC
40.0000 mg | DELAYED_RELEASE_TABLET | Freq: Every day | ORAL | Status: DC
  Administered 2016-07-09 – 2016-07-10 (×2): 40 mg via ORAL
  Filled 2016-07-09 (×2): qty 1

## 2016-07-09 MED ORDER — TIOTROPIUM BROMIDE MONOHYDRATE 18 MCG IN CAPS
18.0000 ug | ORAL_CAPSULE | Freq: Two times a day (BID) | RESPIRATORY_TRACT | Status: DC
  Administered 2016-07-09 – 2016-07-10 (×2): 18 ug via RESPIRATORY_TRACT
  Filled 2016-07-09: qty 5

## 2016-07-09 MED ORDER — ONDANSETRON HCL 4 MG PO TABS: 4.0000 mg | ORAL_TABLET | Freq: Four times a day (QID) | ORAL | Status: DC | PRN

## 2016-07-09 MED ORDER — HYDROCHLOROTHIAZIDE 25 MG PO TABS
25.0000 mg | ORAL_TABLET | Freq: Every day | ORAL | Status: DC
  Administered 2016-07-10: 25 mg via ORAL
  Filled 2016-07-09: qty 1

## 2016-07-09 MED ORDER — ACETAMINOPHEN 325 MG PO TABS: 650.0000 mg | ORAL_TABLET | Freq: Four times a day (QID) | ORAL | Status: DC | PRN

## 2016-07-09 MED ORDER — VANCOMYCIN HCL 500 MG IV SOLR
500.0000 mg | INTRAVENOUS | Status: DC
  Administered 2016-07-10: 500 mg via INTRAVENOUS
  Filled 2016-07-09: qty 500

## 2016-07-09 MED ORDER — ONDANSETRON HCL 4 MG/2ML IJ SOLN: 4.0000 mg | Freq: Four times a day (QID) | INTRAMUSCULAR | Status: DC | PRN

## 2016-07-09 MED ORDER — ENOXAPARIN SODIUM 40 MG/0.4ML ~~LOC~~ SOLN
40.0000 mg | SUBCUTANEOUS | Status: DC
  Administered 2016-07-09: 40 mg via SUBCUTANEOUS
  Filled 2016-07-09: qty 0.4

## 2016-07-09 MED ORDER — RALOXIFENE HCL 60 MG PO TABS
60.0000 mg | ORAL_TABLET | Freq: Every day | ORAL | Status: DC
  Administered 2016-07-10: 60 mg via ORAL
  Filled 2016-07-09: qty 1

## 2016-07-09 MED ORDER — VANCOMYCIN HCL IN DEXTROSE 1-5 GM/200ML-% IV SOLN
1000.0000 mg | Freq: Once | INTRAVENOUS | Status: AC
  Administered 2016-07-09: 1000 mg via INTRAVENOUS
  Filled 2016-07-09: qty 200

## 2016-07-09 MED ORDER — MOMETASONE FURO-FORMOTEROL FUM 200-5 MCG/ACT IN AERO
2.0000 | INHALATION_SPRAY | Freq: Two times a day (BID) | RESPIRATORY_TRACT | Status: DC
  Administered 2016-07-09 – 2016-07-10 (×2): 2 via RESPIRATORY_TRACT
  Filled 2016-07-09: qty 8.8

## 2016-07-09 MED ORDER — ACETAMINOPHEN 650 MG RE SUPP: 650.0000 mg | Freq: Four times a day (QID) | RECTAL | Status: DC | PRN

## 2016-07-09 MED ORDER — THEOPHYLLINE ER 100 MG PO CP24
100.0000 mg | ORAL_CAPSULE | Freq: Two times a day (BID) | ORAL | Status: DC
  Administered 2016-07-09: 100 mg via ORAL
  Filled 2016-07-09 (×4): qty 1

## 2016-07-09 MED ORDER — ACETAMINOPHEN 325 MG PO TABS
650.0000 mg | ORAL_TABLET | Freq: Four times a day (QID) | ORAL | Status: DC | PRN
Start: 2016-07-09 — End: 2016-07-10

## 2016-07-09 MED ORDER — POTASSIUM CHLORIDE CRYS ER 20 MEQ PO TBCR
20.0000 meq | EXTENDED_RELEASE_TABLET | Freq: Two times a day (BID) | ORAL | Status: DC
  Administered 2016-07-09 – 2016-07-10 (×2): 20 meq via ORAL
  Filled 2016-07-09 (×2): qty 1

## 2016-07-09 NOTE — Progress Notes (Signed)
Spoke with patient about Rx does not stock a medication, could she bring own medication. Pharmacy is unable to get from any sources.

## 2016-07-09 NOTE — H&P (Signed)
[facility] NAME: Melanie Huffman    MR#:  [id number]  DATE OF BIRTH:  Mar 26, 1928  DATE OF ADMISSION:  07/09/2016  PRIMARY CARE PHYSICIAN: Dion Body, MD   REQUESTING/REFERRING PHYSICIAN: Dr. Lenise Arena  CHIEF COMPLAINT:   Chief Complaint  Patient presents with  . Cellulitis    HISTORY OF PRESENT ILLNESS:  Melanie Huffman  is a 82 y.o. female with a known history of Asthma, hx of Breast Cancer, COPD, HTN, PVD, who presented to the hospital due to LLE redness, swelling and pain.  Pt. Noticed this a week  Or so ago and was started on Oral Doxycyline and IM rocephin but it has not improved. She went to follow up to see her PCP today and she was referred to the ER as she had failed oral antibiotics. Pt. Denies any fever, N/V, abdominal pain or any other associated symptoms.  Hospitalist services were contacted for further treatment and evaluation.   PAST MEDICAL HISTORY:   Past Medical History:  Diagnosis Date  . Asthma   . Cancer ([place]) 1991   L Breast  . COPD (chronic obstructive pulmonary disease) ([place])   . Heart murmur   . Hypertension   . Peripheral vascular disease ([place])   . Shortness of breath dyspnea   . Traumatic subdural hematoma ([facility])     PAST SURGICAL HISTORY:   Past Surgical History:  Procedure Laterality Date  . ABDOMINAL HYSTERECTOMY     Partial  . APPENDECTOMY    . BREAST BIOPSY Right    1980 and 1970's  . BREAST SURGERY  1991   L Mastectomy  . MASTECTOMY    . ORIF PATELLA Left 04/12/2015   Procedure: OPEN REDUCTION INTERNAL (ORIF) FIXATION PATELLA;  Surgeon: Hessie Knows, MD;  Location: [facility] ORS;  Service: Orthopedics;  Laterality: Left;  . PERIPHERAL VASCULAR CATHETERIZATION Left 04/17/2015   Procedure: Lower Extremity Angiography;  Surgeon: Katha Cabal, MD;  Location: [place] [facility];  Service: Cardiovascular;  Laterality: Left;  . PERIPHERAL VASCULAR CATHETERIZATION  04/17/2015   Procedure: Lower Extremity Intervention;  Surgeon: Katha Cabal, MD;  Location: [place] [facility];  Service: Cardiovascular;;  . TONSILLECTOMY      SOCIAL HISTORY:   Social History  Substance Use Topics  . Smoking status: Former Smoker    Packs/day: 0.50    Years: 20.00    Types: Cigarettes    Quit date: 01/26/1979  . Smokeless tobacco: Never Used  . Alcohol use No    FAMILY HISTORY:   Family History  Problem Relation Age of Onset  . CVA Mother   . Kidney disease Mother   . Heart disease Father     DRUG ALLERGIES:   Allergies  Allergen Reactions  . Amlodipine Swelling     [Onset: 08/26/2013]  . Codeine Nausea Only  . Sulfa Antibiotics Rash and Nausea Only    REVIEW OF SYSTEMS:   Review of Systems  Constitutional: Negative for fever and weight loss.  HENT: Negative for congestion, nosebleeds and tinnitus.   Eyes: Negative for blurred vision, double vision and redness.  Respiratory: Negative for cough, hemoptysis and shortness of breath.   Cardiovascular: Negative for chest pain, orthopnea, leg swelling and PND.  Gastrointestinal: Negative for abdominal pain, diarrhea, melena, nausea and vomiting.  Genitourinary: Negative for dysuria, hematuria and urgency.  Musculoskeletal: Negative for falls and joint pain.  Neurological: Negative for dizziness, tingling, sensory change, focal weakness, seizures, weakness  and headaches.  Endo/Heme/Allergies: Negative for polydipsia. Does not bruise/bleed easily.  Psychiatric/Behavioral: Negative for depression and memory loss. The patient is not nervous/anxious.     MEDICATIONS AT HOME:   Prior to Admission medications   Medication Sig Start Date End Date Taking? Authorizing Provider  acetaminophen (TYLENOL) 325 MG tablet Take 2 tablets (650 mg total) by mouth every 6 (six) hours as needed for mild pain (or Fever >/= 101). 04/16/15  Yes Reche Dixon, PA-C  Ascorbic Acid (VITAMIN C PO) Take 1 capsule by mouth daily.   Yes  Historical Provider, MD  Calcium Carbonate-Vitamin D (CALCIUM + D PO) Take 1 tablet by mouth 2 (two) times daily.   Yes Historical Provider, MD  clobetasol (TEMOVATE) 0.05 % GEL Apply 1 application topically as needed (if gums bleed after brushing teeth).    Yes Historical Provider, MD  fluocinonide gel (LIDEX) [id number] % Apply 1 application topically as needed (after brushing teeth if they do not bleed).    Yes Historical Provider, MD  Fluticasone-Salmeterol (ADVAIR) 250-50 MCG/DOSE AEPB Inhale 1 puff into the lungs 2 (two) times daily.   Yes Historical Provider, MD  hydrochlorothiazide (HYDRODIURIL) 25 MG tablet Take 25 mg by mouth daily.   Yes Historical Provider, MD  Multiple Vitamins-Minerals (CENTRUM SILVER PO) Take 1 tablet by mouth daily.   Yes Historical Provider, MD  omeprazole (PRILOSEC) 40 MG capsule Take 40 mg by mouth daily.    Yes Historical Provider, MD  potassium chloride SA (K-DUR,KLOR-CON) 20 MEQ tablet Take 20 mEq by mouth 2 (two) times daily.    Yes Historical Provider, MD  raloxifene (EVISTA) 60 MG tablet Take 60 mg by mouth daily.   Yes Historical Provider, MD  ramipril (ALTACE) 10 MG capsule Take 20 mg by mouth daily.    Yes Historical Provider, MD  theophylline (THEO-24) 100 MG 24 hr capsule Take 100 mg by mouth 2 (two) times daily.   Yes Historical Provider, MD  tiotropium (SPIRIVA) 18 MCG inhalation capsule Place 18 mcg into inhaler and inhale 2 (two) times daily.   Yes Historical Provider, MD      VITAL SIGNS:  Blood pressure (!) 160/76, pulse 76, temperature 98.4 F (36.9 C), temperature source Oral, resp. rate 15, height 5\' 5"  (1.651 m), weight 55.8 kg (123 lb), SpO2 96 %.  PHYSICAL EXAMINATION:  Physical Exam  GENERAL:  82 y.o.-year-old patient lying in the bed in no acute distress.  EYES: Pupils equal, round, reactive to light and accommodation. No scleral icterus. Extraocular muscles intact.  HEENT: Head atraumatic, normocephalic. Oropharynx and nasopharynx  clear. No oropharyngeal erythema, moist oral mucosa  NECK:  Supple, no jugular venous distention. No thyroid enlargement, no tenderness.  LUNGS: Normal breath sounds bilaterally, no wheezing, rales, rhonchi. No use of accessory muscles of respiration.  CARDIOVASCULAR: S1, S2 RRR. No murmurs, rubs, gallops, clicks.  ABDOMEN: Soft, nontender, nondistended. Bowel sounds present. No organomegaly or mass.  LLE redness, swelling consistent with cellulitis.    EXTREMITIES: No pedal edema, cyanosis, or clubbing. + 2 pedal & radial pulses b/l.   NEUROLOGIC: Cranial nerves II through XII are intact. No focal Motor or sensory deficits appreciated b/l PSYCHIATRIC: The patient is alert and oriented x 3. Good affect.  SKIN: No obvious rash, lesion, or ulcer.  LLE cellulitis.    LABORATORY PANEL:   CBC  Recent Labs Lab 07/09/16 1122  WBC 6.3  HGB 13.7  HCT 38.3  PLT 274   ------------------------------------------------------------------------------------------------------------------  Chemistries  No results  for input(s): NA, K, CL, CO2, GLUCOSE, BUN, CREATININE, CALCIUM, MG, AST, ALT, ALKPHOS, BILITOT in the last 168 hours.  Invalid input(s): GFRCGP ------------------------------------------------------------------------------------------------------------------  Cardiac Enzymes No results for input(s): TROPONINI in the last 168 hours. ------------------------------------------------------------------------------------------------------------------  RADIOLOGY:  US Venous Img Lower Unilateral Left  Result Date: 07/09/2016 CLINICAL DATA:  Left leg swelling and pain. EXAM: LEFT LOWER EXTREMITY VENOUS DOPPLER ULTRASOUND TECHNIQUE: Gray-scale sonography with graded compression, as well as color Doppler and duplex ultrasound, were performed to evaluate the deep venous system from the level of the common femoral vein through the popliteal and proximal calf veins. Spectral Doppler was utilized to  evaluate flow at rest and with distal augmentation maneuvers. COMPARISON:  None. FINDINGS: Right common femoral vein is compressible without thrombus. Normal compressibility, augmentation and color Doppler flow in the left common femoral vein, left femoral vein and left popliteal vein. The left saphenofemoral junction is patent. Left profunda femoral vein is patent without thrombus. Visualized left deep calf veins are patent without thrombus. Subcutaneous edema in the left calf. Anechoic structure in the left popliteal fossa is compatible with a cyst. This cyst measures 3.4 x 1.1 x 0.9 cm. IMPRESSION: Negative for deep venous thrombosis in left lower extremity. Baker's cyst measuring up to 3.4 cm. Electronically Signed   By: Markus Daft M.D.   On: 07/09/2016 13:06     IMPRESSION AND PLAN:   82 year old female with past medical history of hypertension, COPD, history of breast cancer, asthma who presented to the hospital left lower extremity redness swelling and pain.  1. Left lower extremity cellulitis-patient has failed outpatient therapy with oral doxycycline and IM ceftriaxone. -She has a previous history of MRSA. I will start her on IV vancomycin. -Follow clinically. She is afebrile and hemodynamically stable with a normal white cell count. Dopplers of lower Extremity are (-) for DVT.  2. Essential hypertension-continue ramipril, HCTZ.  3. History of breast cancer-continue raloxifene.  4. COPD-no acute exacerbation-continue Dulera, Spiriva, theophylline.  5. GERD-continue Protonix.  All the records are reviewed and case discussed with ED provider. Management plans discussed with the patient, family and they are in agreement.  CODE STATUS: Full  TOTAL TIME TAKING CARE OF THIS PATIENT: 45 minutes.    Henreitta Leber M.D on 07/09/2016 at 2:45 PM  Between 7am to 6pm - Pager - [phone]  After 6pm go to [email or web address] - password [email or web address] [facility]  Office   [phone]  CC: Primary care physician; Dion Body, MD

## 2016-07-09 NOTE — ED Notes (Signed)
Patient was able to get up and walk to the restroom without any issues.

## 2016-07-09 NOTE — Consult Note (Signed)
Pharmacy Antibiotic Note  Melanie Huffman is a 82 y.o. female admitted on 07/09/2016 with cellulitis.  Pharmacy has been consulted for vancomycin dosing.  Plan: Patient received 1g of vancomycin in the ED.  Vancomycin 500 IV every 24 hours.  Goal trough 10-15 mcg/mL. Trough at steady state 0813 @ 1230  Height: 5\' 5"  (165.1 cm) Weight: 123 lb (55.8 kg) IBW/kg (Calculated) : 57  Temp (24hrs), Avg:98.4 F (36.9 C), Min:98.4 F (36.9 C), Max:98.4 F (36.9 C)   Recent Labs Lab 07/09/16 1122  WBC 6.3    Estimated Creatinine Clearance: 31.5 mL/min (by C-G formula based on SCr of 1.15 mg/dL).    Allergies  Allergen Reactions  . Amlodipine Swelling     [Onset: 08/26/2013]  . Codeine Nausea Only  . Sulfa Antibiotics Rash and Nausea Only    Antimicrobials this admission: vancomycin 8/9 >>    Dose adjustments this admission:   Microbiology results:   Thank you for allowing pharmacy to be a part of this patient's care.  Ramond Dial, Pharm.D Clinical Pharmacist  07/09/2016 4:18 PM

## 2016-07-09 NOTE — ED Triage Notes (Signed)
Pt sent from urgent care for cellulitis of left leg, pt states she has been on abx with no relief, left leg red and tight, swollen

## 2016-07-09 NOTE — ED Provider Notes (Signed)
[facility] Emergency Department Provider Note        Time seen: ----------------------------------------- 12:10 PM on 07/09/2016 -----------------------------------------    I have reviewed the triage vital signs and the nursing notes.   HISTORY  Chief Complaint Cellulitis    HPI Melanie Huffman is a 82 y.o. female who presents the ER for cellulitis of the left leg. She was sent here by urgent care for same. Patient states she's been treated with antibiotic no relief. Large red area was noted below the knee on the left. She denies fevers or chills, denies chest pain or difficulty breathing.   Past Medical History:  Diagnosis Date  . Asthma   . Cancer ([place]) 1991   L Breast  . COPD (chronic obstructive pulmonary disease) ([place])   . Heart murmur   . Hypertension   . Peripheral vascular disease ([place])   . Shortness of breath dyspnea   . Traumatic subdural hematoma [facility])     Patient Active Problem List   Diagnosis Date Noted  . Patellar fracture 04/11/2015  . Subdural hematoma ([place]) 07/26/2014    Past Surgical History:  Procedure Laterality Date  . ABDOMINAL HYSTERECTOMY     Partial  . APPENDECTOMY    . BREAST BIOPSY Right    1980 and 1970's  . BREAST SURGERY  1991   L Mastectomy  . MASTECTOMY    . ORIF PATELLA Left 04/12/2015   Procedure: OPEN REDUCTION INTERNAL (ORIF) FIXATION PATELLA;  Surgeon: Hessie Knows, MD;  Location: [facility] ORS;  Service: Orthopedics;  Laterality: Left;  . PERIPHERAL VASCULAR CATHETERIZATION Left 04/17/2015   Procedure: Lower Extremity Angiography;  Surgeon: Katha Cabal, MD;  Location: [place] [facility];  Service: Cardiovascular;  Laterality: Left;  . PERIPHERAL VASCULAR CATHETERIZATION  04/17/2015   Procedure: Lower Extremity Intervention;  Surgeon: Katha Cabal, MD;  Location: [place] [facility];  Service: Cardiovascular;;  . TONSILLECTOMY      Allergies Amlodipine; Codeine; and Sulfa  antibiotics  Social History Social History  Substance Use Topics  . Smoking status: Former Smoker    Packs/day: 0.50    Years: 20.00    Types: Cigarettes    Quit date: 01/26/1979  . Smokeless tobacco: Never Used  . Alcohol use No    Review of Systems Constitutional: Negative for fever. Cardiovascular: Negative for chest pain. Respiratory: Negative for shortness of breath. Gastrointestinal: Negative for abdominal pain, vomiting and diarrhea. Genitourinary: Negative for dysuria. Musculoskeletal: Positive for left leg erythema Skin: Positive for left leg erythema Neurological: Negative for headaches, focal weakness or numbness.  10-point ROS otherwise negative.  ____________________________________________   PHYSICAL EXAM:  VITAL SIGNS: ED Triage Vitals  Enc Vitals Group     BP 07/09/16 1116 (!) 148/84     Pulse Rate 07/09/16 1116 66     Resp 07/09/16 1116 18     Temp 07/09/16 1116 98.4 F (36.9 C)     Temp Source 07/09/16 1116 Oral     SpO2 07/09/16 1116 99 %     Weight 07/09/16 1116 123 lb (55.8 kg)     Height 07/09/16 1116 5\' 5"  (1.651 m)     Head Circumference --      Peak Flow --      Pain Score 07/09/16 1117 3     Pain Loc --      Pain Edu? --      Excl. in [place]? --     Constitutional: Alert and oriented. Well appearing  and in no distress. Eyes: Conjunctivae are normal. PERRL. Normal extraocular movements. ENT   Head: Normocephalic and atraumatic.   Nose: No congestion/rhinnorhea.   Mouth/Throat: Mucous membranes are moist.   Neck: No stridor. Cardiovascular: Normal rate, regular rhythm. No murmurs, rubs, or gallops.ABIs on the left side are normal. Respiratory: Normal respiratory effort without tachypnea nor retractions. Breath sounds are clear and equal bilaterally. No wheezes/rales/rhonchi. Gastrointestinal: Soft and nontender. Normal bowel sounds Musculoskeletal: Nontender with normal range of motion in all extremities. No lower extremity  tenderness, Bilateral edema is noted Neurologic:  Normal speech and language. No gross focal neurologic deficits are appreciated.  Skin:  Skin is warm, dry and intact, Erythema below the left knee down to her foot.  Psychiatric: Mood and affect are normal. Speech and behavior are normal.  ____________________________________________  ED COURSE:  Pertinent labs & imaging results that were available during my care of the patient were reviewed by me and considered in my medical decision making (see chart for details). Clinical Course  Patients in no acute distress, will check basic labs, consider ultrasound.  Procedures ____________________________________________   LABS (pertinent positives/negatives)  Labs Reviewed  CBC    RADIOLOGY  Ultrasound left lower extremity IMPRESSION: Negative for deep venous thrombosis in left lower extremity.  Baker's cyst measuring up to 3.4 cm.  ____________________________________________  FINAL ASSESSMENT AND PLAN  Cellulitis  Plan: Patient with labs and imaging as dictated above. Patient has failed outpatient treatment for cellulitis of the left lower extremity. She does have a history of MRSA, I have ordered IV vancomycin. I will discuss with the hospitalist for observation.   Earleen Newport, MD   Note: This dictation was prepared with Dragon dictation. Any transcriptional errors that result from this process are unintentional    Earleen Newport, MD 07/09/16 1356

## 2016-07-09 NOTE — Progress Notes (Signed)
Patient stated that she does not take theo or HCTZ, stated PCP discontinued. Refused medication

## 2016-07-09 NOTE — ED Notes (Signed)
MD at bedside. 

## 2016-07-10 DIAGNOSIS — E876 Hypokalemia: Secondary | ICD-10-CM | POA: Diagnosis not present

## 2016-07-10 DIAGNOSIS — E871 Hypo-osmolality and hyponatremia: Secondary | ICD-10-CM | POA: Diagnosis not present

## 2016-07-10 DIAGNOSIS — N289 Disorder of kidney and ureter, unspecified: Secondary | ICD-10-CM

## 2016-07-10 DIAGNOSIS — L0390 Cellulitis, unspecified: Secondary | ICD-10-CM | POA: Diagnosis not present

## 2016-07-10 DIAGNOSIS — E860 Dehydration: Secondary | ICD-10-CM

## 2016-07-10 LAB — BASIC METABOLIC PANEL
Anion gap: 7 (ref 5–15)
BUN: 11 mg/dL (ref 6–20)
CALCIUM: 8.4 mg/dL — AB (ref 8.9–10.3)
CHLORIDE: 99 mmol/L — AB (ref 101–111)
CO2: 26 mmol/L (ref 22–32)
CREATININE: 0.5 mg/dL (ref 0.44–1.00)
GFR calc non Af Amer: >60 mL/min (ref >60)
Glucose, Bld: 100 mg/dL — ABNORMAL HIGH (ref 65–99)
Potassium: 3.3 mmol/L — ABNORMAL LOW (ref 3.5–5.1)
SODIUM: 132 mmol/L — AB (ref 135–145)

## 2016-07-10 LAB — CBC
HCT: 33.8 % — ABNORMAL LOW (ref 35.0–47.0)
Hemoglobin: 12.3 g/dL (ref 12.0–16.0)
MCH: 32.4 pg (ref 26.0–34.0)
MCHC: 36.2 g/dL — ABNORMAL HIGH (ref 32.0–36.0)
MCV: 89.5 fL (ref 80.0–100.0)
PLATELETS: 251 10*3/uL (ref 150–440)
RBC: 3.78 MIL/uL — AB (ref 3.80–5.20)
RDW: 13.2 % (ref 11.5–14.5)
WBC: 4 10*3/uL (ref 3.6–11.0)

## 2016-07-10 LAB — MRSA PCR SCREENING: MRSA BY PCR: NEGATIVE

## 2016-07-10 MED ORDER — CLINDAMYCIN HCL 300 MG PO CAPS: 300.0000 mg | ORAL_CAPSULE | Freq: Three times a day (TID) | ORAL | 0 refills | Status: DC
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
## 2016-07-10 NOTE — Progress Notes (Signed)
Patient's discharge summary reviewed with verbal understanding. Belongings packed and given upon discharge. VSS at this time.

## 2016-07-10 NOTE — Consult Note (Signed)
   [facility] CM Inpatient Consult   07/10/2016  CURLEY GRGAS August 01, 1928 [id number]  Patient screened for potential [place] Management services. Patient was eligible for [place]. Went by to see patient and she had already been discharged. For questions please contact:   Mallorey Odonell RN, [facility] Liaison  [phone]) Business Mobile [phone]) Toll free office

## 2016-07-10 NOTE — Care Management Note (Signed)
Case Management Note  Patient Details  Name: Melanie Huffman MRN: [id number] Date of Birth: Jul 19, 1928  Subjective/Objective:      Spoke with patient who is alert and oriented from home, Drives self and uses no DME. Stated that she walks daily and her friends call her "speedy" .  Patient has support of daughter and many friends some of which were present in the room. Patient stated that she was very impressed with the care she has had here at this hospital. She stated that she feels almost back to normal and is ready to go home. She stated her daughter will be picking her up today.  No CM needs identified.               Action/Plan: Home with self care.   Expected Discharge Date:                  Expected Discharge Plan:  Home/Self Care  In-House Referral:     Discharge planning Services  CM Consult  Post Acute Care Choice:    Choice offered to:     DME Arranged:    DME Agency:     HH Arranged:    [place] Agency:     Status of Service:  Completed, signed off  If discussed at [employer] of Stay Meetings, dates discussed:    Additional Comments:  Alvie Heidelberg, RN 07/10/2016, 11:05 AM

## 2016-07-10 NOTE — Discharge Summary (Signed)
[facility] NAME: Melanie Huffman    MR#:  [id number]  DATE OF BIRTH:  1928-08-17  DATE OF ADMISSION:  07/09/2016 ADMITTING PHYSICIAN: Henreitta Leber, MD  DATE OF DISCHARGE: 07/10/2016  4:09 PM  PRIMARY CARE PHYSICIAN: Dion Body, MD     ADMISSION DIAGNOSIS:  Cellulitis of left lower extremity [id number]  DISCHARGE DIAGNOSIS:  Principal Problem:   Cellulitis of leg, left Active Problems:   Hyponatremia   Dehydration   Acute renal insufficiency   Hypokalemia   SECONDARY DIAGNOSIS:   Past Medical History:  Diagnosis Date  . Asthma   . Cancer ([place]) 1991   L Breast  . COPD (chronic obstructive pulmonary disease) ([place])   . Heart murmur   . Hypertension   . Peripheral vascular disease ([place])   . Shortness of breath dyspnea   . Traumatic subdural hematoma ([facility])     .pro HOSPITAL COURSE:  Melanie Huffman  is a 82 y.o. female with a known history of Asthma, hx of Breast Cancer, COPD, HTN, PVD, who presented to the hospital due to LLE redness, swelling and pain.  The patient was started on Oral Doxycyline and IM rocephin as outpatient, but has not improved. She went to follow up to see her PCP on the day of admission and she was referred to the ER as she had failed oral antibiotics. On arrival to the hospital. The patient denied  any fever, N/V, abdominal pain or any other associated symptoms.  Hospitalist services were contacted for further treatment and evaluation. She was admitted to the hospital for IV vancomycin and her condition improved, erythema subsided as well as swelling and pain. She had left lower extremity Doppler ultrasound which was negative for DVT. Since there was no purulence, no cultures were obtained. MRSA PCR was negative. It was felt that patient is stable to be discharged home on clindamycin. She was advised to continue follow-up with Dr. Netty Starring.  Discussion by problem: #1. Left lower extremity  cellulitis, improved with antibiotic therapy, discontinue vancomycin as patient's MRSA PCR is negative, initiate of clindamycin orally, patient requested to be discharged from the hospital ASAP since she has an important appointment tomorrow morning, she was not willing to stay overnight. She was advised to follow-up with Dr.Linthavong to assess progress of her therapy. Doppler ultrasound was negative for DVT #2. Hyponatremia, improved with IV fluid administration, likely dehydration related #3. Hypokalemia, supplement orally, follow up as outpatient #4 acute renal insufficiency, resolved with IV fluid administration #5. Essential hypertension, COPD, gastroesophageal reflux disease, stable, continue outpatient medications, no changes were made  DISCHARGE CONDITIONS:   Stable  CONSULTS OBTAINED:    DRUG ALLERGIES:   Allergies  Allergen Reactions  . Amlodipine Swelling     [Onset: 08/26/2013]  . Codeine Nausea Only  . Sulfa Antibiotics Rash and Nausea Only    DISCHARGE MEDICATIONS:   Discharge Medication List as of 07/10/2016  2:11 PM    START taking these medications   Details  clindamycin (CLEOCIN) 300 MG capsule Take 1 capsule (300 mg total) by mouth 3 (three) times daily., Starting Thu 07/10/2016, Normal      CONTINUE these medications which have NOT CHANGED   Details  acetaminophen (TYLENOL) 325 MG tablet Take 2 tablets (650 mg total) by mouth every 6 (six) hours as needed for mild pain (or Fever >/= 101)., Starting Mon 04/16/2015, OTC    Ascorbic Acid (VITAMIN C PO) Take 1 capsule by  mouth daily., Historical Med    Calcium Carbonate-Vitamin D (CALCIUM + D PO) Take 1 tablet by mouth 2 (two) times daily., Historical Med    clobetasol (TEMOVATE) 0.05 % GEL Apply 1 application topically as needed (if gums bleed after brushing teeth). , Historical Med    fluocinonide gel (LIDEX) [id number] % Apply 1 application topically as needed (after brushing teeth if they do not bleed). ,  Historical Med    Fluticasone-Salmeterol (ADVAIR) 250-50 MCG/DOSE AEPB Inhale 1 puff into the lungs 2 (two) times daily., Historical Med    Multiple Vitamins-Minerals (CENTRUM SILVER PO) Take 1 tablet by mouth daily., Historical Med    omeprazole (PRILOSEC) 40 MG capsule Take 40 mg by mouth daily. , Historical Med    potassium chloride SA (K-DUR,KLOR-CON) 20 MEQ tablet Take 20 mEq by mouth 2 (two) times daily. , Historical Med    raloxifene (EVISTA) 60 MG tablet Take 60 mg by mouth daily., Historical Med    ramipril (ALTACE) 10 MG capsule Take 20 mg by mouth daily. , Historical Med    theophylline (THEO-24) 100 MG 24 hr capsule Take 100 mg by mouth 2 (two) times daily., Historical Med    tiotropium (SPIRIVA) 18 MCG inhalation capsule Place 18 mcg into inhaler and inhale 2 (two) times daily., Historical Med    hydrochlorothiazide (HYDRODIURIL) 25 MG tablet Take 25 mg by mouth daily., Historical Med         DISCHARGE INSTRUCTIONS:    Patient is to follow-up with her primary care physician within few days after discharge  If you experience worsening of your admission symptoms, develop shortness of breath, life threatening emergency, suicidal or homicidal thoughts you must seek medical attention immediately by calling 911 or calling your MD immediately  if symptoms less severe.  You Must read complete instructions/literature along with all the possible adverse reactions/side effects for all the Medicines you take and that have been prescribed to you. Take any new Medicines after you have completely understood and accept all the possible adverse reactions/side effects.   Please note  You were cared for by a hospitalist during your hospital stay. If you have any questions about your discharge medications or the care you received while you were in the hospital after you are discharged, you can call the unit and asked to speak with the hospitalist on call if the hospitalist that took care  of you is not available. Once you are discharged, your primary care physician will handle any further medical issues. Please note that NO REFILLS for any discharge medications will be authorized once you are discharged, as it is imperative that you return to your primary care physician (or establish a relationship with a primary care physician if you do not have one) for your aftercare needs so that they can reassess your need for medications and monitor your lab values.    Today   CHIEF COMPLAINT:   Chief Complaint  Patient presents with  . Cellulitis    HISTORY OF PRESENT ILLNESS:  Melanie Huffman  is a 82 y.o. female with a known history of  Asthma, hx of Breast Cancer, COPD, HTN, PVD, who presented to the hospital due to LLE redness, swelling and pain.  The patient was started on Oral Doxycyline and IM rocephin as outpatient, but has not improved. She went to follow up to see her PCP on the day of admission and she was referred to the ER as she had failed oral antibiotics. On arrival to  the hospital. The patient denied  any fever, N/V, abdominal pain or any other associated symptoms.  Hospitalist services were contacted for further treatment and evaluation. She was admitted to the hospital for IV vancomycin and her condition improved, erythema subsided as well as swelling and pain. She had left lower extremity Doppler ultrasound which was negative for DVT. Since there was no purulence, no cultures were obtained. MRSA PCR was negative. It was felt that patient is stable to be discharged home on clindamycin. She was advised to continue follow-up with Dr. Netty Starring.  Discussion by problem: #1. Left lower extremity cellulitis, improved with antibiotic therapy, discontinue vancomycin as patient's MRSA PCR is negative, initiate of clindamycin orally, patient requested to be discharged from the hospital ASAP since she has an important appointment tomorrow morning, she was not willing to stay overnight.  She was advised to follow-up with Dr.Linthavong to assess progress of her therapy. Doppler ultrasound was negative for DVT #2. Hyponatremia, improved with IV fluid administration, likely dehydration related #3. Hypokalemia, supplement orally, follow up as outpatient #4 acute renal insufficiency, resolved with IV fluid administration #5. Essential hypertension, COPD, gastroesophageal reflux disease, stable, continue outpatient medications, no changes were made    VITAL SIGNS:  Blood pressure (!) 159/89, pulse 74, temperature 98.1 F (36.7 C), temperature source Oral, resp. rate 19, height 5\' 5"  (1.651 m), weight 55.8 kg (123 lb), SpO2 99 %.  I/O:   Intake/Output Summary (Last 24 hours) at 07/10/16 1723 Last data filed at 07/10/16 1353  Gross per 24 hour  Intake              840 ml  Output                0 ml  Net              840 ml    PHYSICAL EXAMINATION:  GENERAL:  82 y.o.-year-old patient lying in the bed with no acute distress.  EYES: Pupils equal, round, reactive to light and accommodation. No scleral icterus. Extraocular muscles intact.  HEENT: Head atraumatic, normocephalic. Oropharynx and nasopharynx clear.  NECK:  Supple, no jugular venous distention. No thyroid enlargement, no tenderness.  LUNGS: Normal breath sounds bilaterally, no wheezing, rales,rhonchi or crepitation. No use of accessory muscles of respiration.  CARDIOVASCULAR: S1, S2 normal. No murmurs, rubs, or gallops.  ABDOMEN: Soft, non-tender, non-distended. Bowel sounds present. No organomegaly or mass.  EXTREMITIES: Trace to 1+ lower extremity and pedal edema, more pronounced on the left with erythema anteriorly in the lower shin and dorsal aspect of foot on the left, no significant or frequent pain on palpation, mildly increased warmth, no cyanosis, or clubbing.  NEUROLOGIC: Cranial nerves II through XII are intact. Muscle strength 5/5 in all extremities. Sensation intact. Gait not checked.  PSYCHIATRIC: The  patient is alert and oriented x 3.  SKIN: No obvious rash, lesion, or ulcer.   DATA REVIEW:   CBC  Recent Labs Lab 07/10/16 0353  WBC 4.0  HGB 12.3  HCT 33.8*  PLT 251    Chemistries   Recent Labs Lab 07/10/16 0353  NA 132*  K 3.3*  CL 99*  CO2 26  GLUCOSE 100*  BUN 11  CREATININE 0.50  CALCIUM 8.4*    Cardiac Enzymes No results for input(s): TROPONINI in the last 168 hours.  Microbiology Results  Results for orders placed or performed during the hospital encounter of 07/09/16  MRSA PCR Screening     Status: None  Collection Time: 07/10/16  7:45 AM  Result Value Ref Range Status   MRSA by PCR NEGATIVE NEGATIVE Final    Comment:        The GeneXpert MRSA Assay (FDA approved for NASAL specimens only), is one component of a comprehensive MRSA colonization surveillance program. It is not intended to diagnose MRSA infection nor to guide or monitor treatment for MRSA infections.     RADIOLOGY:  US Venous Img Lower Unilateral Left  Result Date: 07/09/2016 CLINICAL DATA:  Left leg swelling and pain. EXAM: LEFT LOWER EXTREMITY VENOUS DOPPLER ULTRASOUND TECHNIQUE: Gray-scale sonography with graded compression, as well as color Doppler and duplex ultrasound, were performed to evaluate the deep venous system from the level of the common femoral vein through the popliteal and proximal calf veins. Spectral Doppler was utilized to evaluate flow at rest and with distal augmentation maneuvers. COMPARISON:  None. FINDINGS: Right common femoral vein is compressible without thrombus. Normal compressibility, augmentation and color Doppler flow in the left common femoral vein, left femoral vein and left popliteal vein. The left saphenofemoral junction is patent. Left profunda femoral vein is patent without thrombus. Visualized left deep calf veins are patent without thrombus. Subcutaneous edema in the left calf. Anechoic structure in the left popliteal fossa is compatible with a  cyst. This cyst measures 3.4 x 1.1 x 0.9 cm. IMPRESSION: Negative for deep venous thrombosis in left lower extremity. Baker's cyst measuring up to 3.4 cm. Electronically Signed   By: Markus Daft M.D.   On: 07/09/2016 13:06    EKG:   Orders placed or performed during the hospital encounter of 07/01/16  . EKG 12-Lead  . EKG 12-Lead  . ED EKG  . ED EKG  . EKG 12-Lead  . EKG 12-Lead      Management plans discussed with the patient, family and they are in agreement.  CODE STATUS:     Code Status Orders        Start     Ordered   07/09/16 1559  Full code  Continuous     07/09/16 1558    Code Status History    Date Active Date Inactive Code Status Order ID Comments User Context   04/12/2015  7:49 PM 04/16/2015  6:18 PM Full Code [id number]  Hessie Knows, MD Inpatient   04/11/2015 12:52 PM 04/12/2015  7:49 PM Full Code [id number]  Hessie Knows, MD Inpatient   07/26/2014  4:45 AM 07/26/2014  7:34 PM Full Code [id number]  Ashok Pall, MD Inpatient    Advance Directive Documentation   Flowsheet Row Most Recent Value  Type of Advance Directive  Healthcare Power of Attorney, Living will  Pre-existing out of facility DNR order (yellow form or pink MOST form)  No data  "MOST" Form in Place?  No data      TOTAL TIME TAKING CARE OF THIS PATIENT: 40 minutes.    Theodoro Grist M.D on 07/10/2016 at 5:23 PM  Between 7am to 6pm - Pager - [phone]  After 6pm go to [email or web address] - password [email or web address] [facility]  Office  [phone]  CC: Primary care physician; Dion Body, MD

## 2016-07-10 NOTE — Care Management Important Message (Signed)
Important Message  Patient Details  Name: Melanie Huffman MRN: [id number] Date of Birth: [birth year]   Medicare Important Message Given:  N/A - LOS <3 / Initial given by admissions    Alvie Heidelberg, RN 07/10/2016, 10:54 AM

## 2016-07-14 DIAGNOSIS — E871 Hypo-osmolality and hyponatremia: Secondary | ICD-10-CM | POA: Diagnosis not present

## 2016-07-14 DIAGNOSIS — L03116 Cellulitis of left lower limb: Secondary | ICD-10-CM | POA: Diagnosis not present

## 2016-07-14 DIAGNOSIS — E876 Hypokalemia: Secondary | ICD-10-CM | POA: Diagnosis not present

## 2016-07-15 DIAGNOSIS — L03116 Cellulitis of left lower limb: Secondary | ICD-10-CM | POA: Diagnosis not present

## 2016-07-17 ENCOUNTER — Other Ambulatory Visit: Payer: Self-pay | Admitting: Family Medicine

## 2016-07-17 DIAGNOSIS — Z1231 Encounter for screening mammogram for malignant neoplasm of breast: Secondary | ICD-10-CM

## 2016-07-21 DIAGNOSIS — I878 Other specified disorders of veins: Secondary | ICD-10-CM | POA: Diagnosis not present

## 2016-07-25 IMAGING — CR DG CHEST 1V PORT
1 series · 1 of 1 positions shown · non-contrast
Comparison: 10/29/2014, 07/26/2014

CLINICAL DATA: 86-year-old female with a history of preoperative
x-ray for knee surgery.

EXAM:
PORTABLE CHEST - 1 VIEW

[ap]
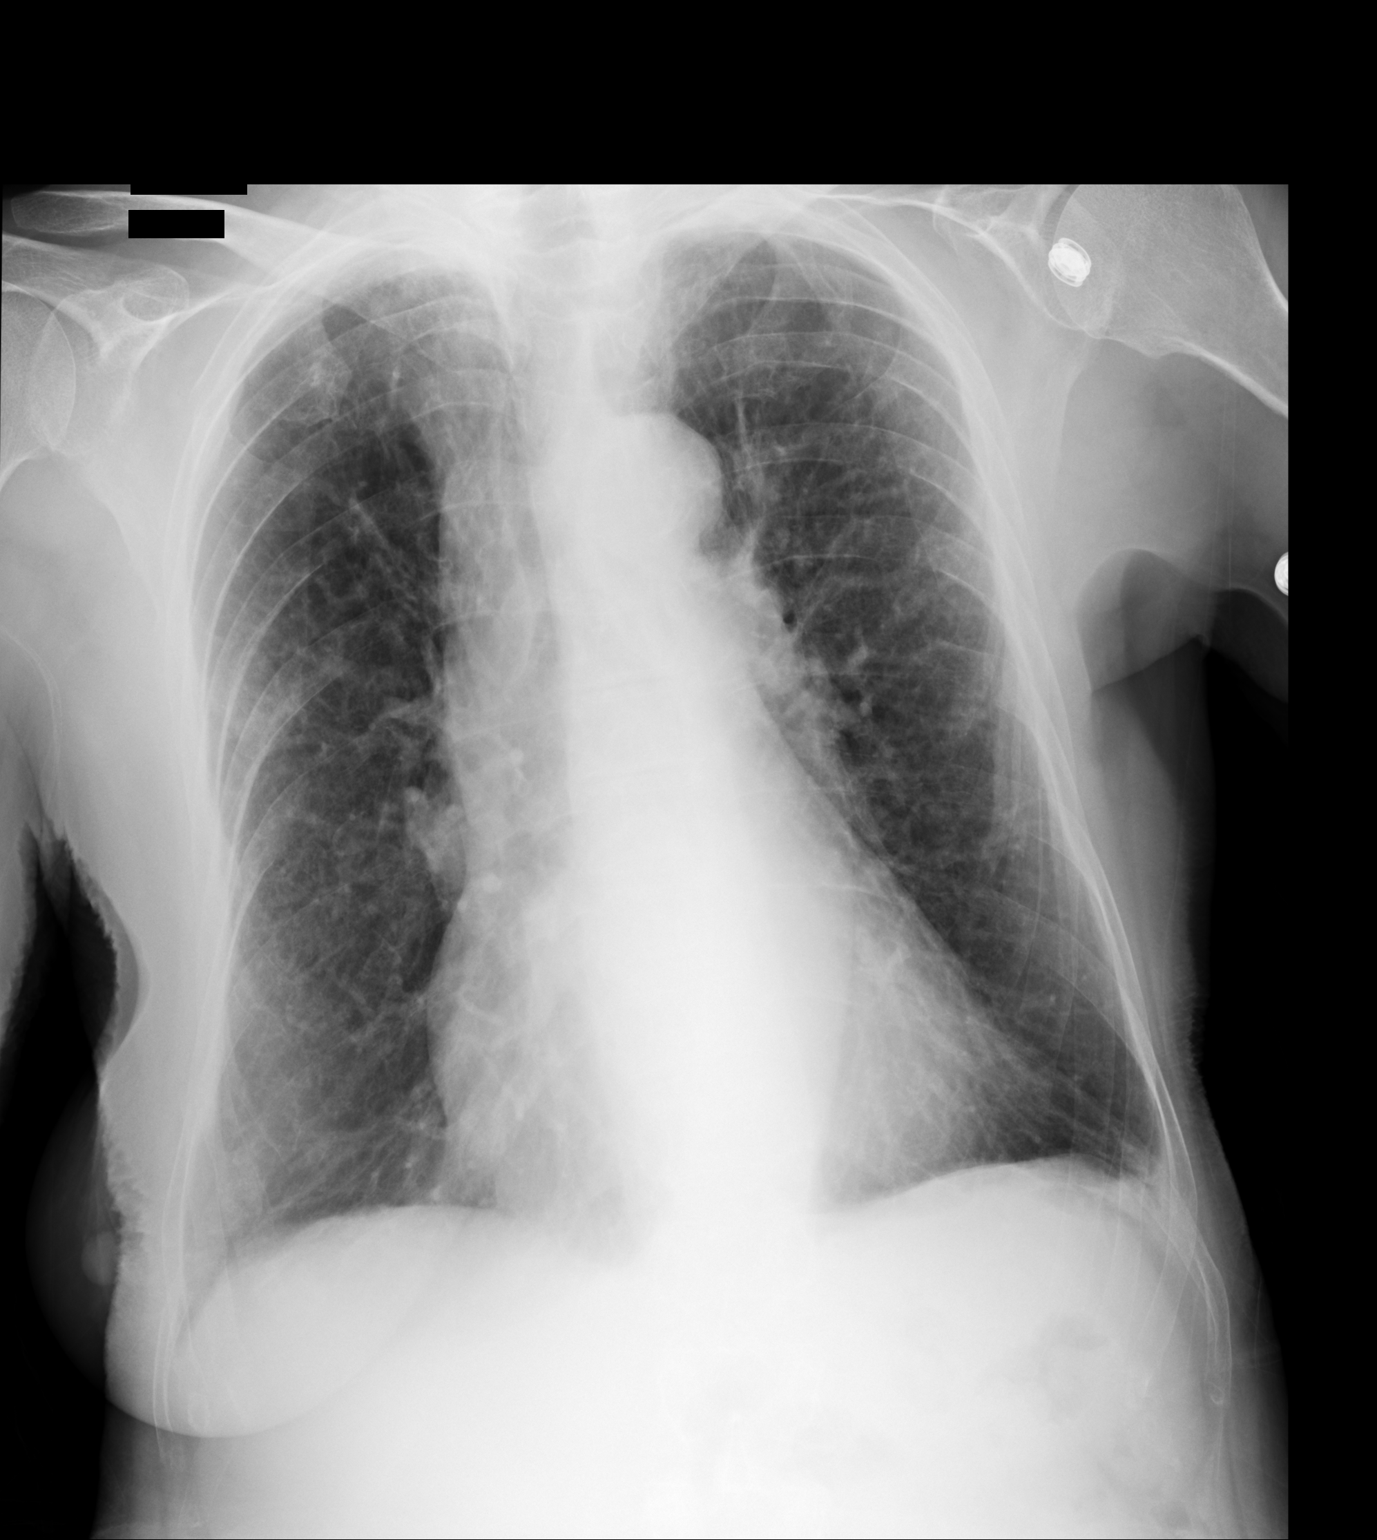

[1 of 1 positions shown; findings below may reference images not displayed]

FINDINGS: Cardiomediastinal silhouette unchanged. Atherosclerotic
calcifications of the aortic arch.

Evidence of emphysema with expanded lung volumes and flattening of
the hemidiaphragm.

Coarsened interstitial markings persists over the course of several
examinations.

No pneumothorax or confluent airspace disease.

No displaced fracture.
IMPRESSION: No radiographic evidence of acute cardiopulmonary disease, with
chronic changes of emphysema.

Atherosclerosis.

## 2016-07-31 DIAGNOSIS — B351 Tinea unguium: Secondary | ICD-10-CM | POA: Diagnosis not present

## 2016-07-31 DIAGNOSIS — M79674 Pain in right toe(s): Secondary | ICD-10-CM | POA: Diagnosis not present

## 2016-07-31 DIAGNOSIS — M79675 Pain in left toe(s): Secondary | ICD-10-CM | POA: Diagnosis not present

## 2016-08-01 ENCOUNTER — Encounter: Payer: Self-pay | Admitting: Emergency Medicine

## 2016-08-01 ENCOUNTER — Emergency Department
Admission: EM | Admit: 2016-08-01 | Discharge: 2016-08-01 | Disposition: A | Discharge status: Home / Self Care | Payer: PPO | Attending: Emergency Medicine | Admitting: Emergency Medicine

## 2016-08-01 DIAGNOSIS — S0990XA Unspecified injury of head, initial encounter: Secondary | ICD-10-CM | POA: Diagnosis not present

## 2016-08-01 DIAGNOSIS — Z79899 Other long term (current) drug therapy: Secondary | ICD-10-CM | POA: Insufficient documentation

## 2016-08-01 DIAGNOSIS — W01198A Fall on same level from slipping, tripping and stumbling with subsequent striking against other object, initial encounter: Secondary | ICD-10-CM | POA: Diagnosis not present

## 2016-08-01 DIAGNOSIS — IMO0002 Reserved for concepts with insufficient information to code with codable children: Secondary | ICD-10-CM

## 2016-08-01 DIAGNOSIS — I10 Essential (primary) hypertension: Secondary | ICD-10-CM | POA: Insufficient documentation

## 2016-08-01 DIAGNOSIS — Y9389 Activity, other specified: Secondary | ICD-10-CM | POA: Diagnosis not present

## 2016-08-01 DIAGNOSIS — S0181XA Laceration without foreign body of other part of head, initial encounter: Secondary | ICD-10-CM | POA: Diagnosis not present

## 2016-08-01 DIAGNOSIS — J449 Chronic obstructive pulmonary disease, unspecified: Secondary | ICD-10-CM | POA: Diagnosis not present

## 2016-08-01 DIAGNOSIS — Z87891 Personal history of nicotine dependence: Secondary | ICD-10-CM | POA: Diagnosis not present

## 2016-08-01 DIAGNOSIS — J45909 Unspecified asthma, uncomplicated: Secondary | ICD-10-CM | POA: Diagnosis not present

## 2016-08-01 DIAGNOSIS — Y9289 Other specified places as the place of occurrence of the external cause: Secondary | ICD-10-CM | POA: Diagnosis not present

## 2016-08-01 DIAGNOSIS — W19XXXA Unspecified fall, initial encounter: Secondary | ICD-10-CM

## 2016-08-01 DIAGNOSIS — Y999 Unspecified external cause status: Secondary | ICD-10-CM | POA: Insufficient documentation

## 2016-08-01 DIAGNOSIS — Z853 Personal history of malignant neoplasm of breast: Secondary | ICD-10-CM | POA: Diagnosis not present

## 2016-08-01 MED ORDER — SILVER NITRATE-POT NITRATE 75-25 % EX MISC
CUTANEOUS | Status: AC
  Filled 2016-08-01: qty 1

## 2016-08-01 NOTE — ED Triage Notes (Signed)
Tripped and fell on concrete.  Skin tear to right hand.  Lac beside right temple, bleeding not well controlled.  Pt on aspirin.  Denies LOC.

## 2016-08-01 NOTE — ED Provider Notes (Signed)
[facility] Emergency Department Provider Note  Time seen: 5:01 PM  I have reviewed the triage vital signs and the nursing notes.   HISTORY  Chief Complaint Fall    HPI Melanie Huffman is a 82 y.o. female with a past medical history of COPD, hypertension, presents to the emergency department after a fall. According to the patient she was outside when she tripped falling forward hitting her head on cement. Patient states significant bleeding, but denies any LOC, nausea, headache. Patient states she drove herself home and then came to the emergency department. Patient currently states she feels fine, and only reason she came was because of the bleeding. Denies any lightheadedness. Denies any headache. Denies any weakness or numbness. [facility] has been ambulatory without difficulty per patient. Her only complaint is bleeding coming from her forehead and a skin tear to her left hand.  Past Medical History:  Diagnosis Date  . Asthma   . Cancer ([place]) 1991   L Breast  . COPD (chronic obstructive pulmonary disease) ([place])   . Heart murmur   . Hypertension   . Peripheral vascular disease ([place])   . Shortness of breath dyspnea   . Traumatic subdural hematoma [facility])     Patient Active Problem List   Diagnosis Date Noted  . Hyponatremia 07/10/2016  . Dehydration 07/10/2016  . Acute renal insufficiency 07/10/2016  . Hypokalemia 07/10/2016  . Cellulitis of leg, left 07/09/2016  . Patellar fracture 04/11/2015  . Subdural hematoma ([place]) 07/26/2014    Past Surgical History:  Procedure Laterality Date  . ABDOMINAL HYSTERECTOMY     Partial  . APPENDECTOMY    . BREAST BIOPSY Right    1980 and 1970's  . BREAST SURGERY  1991   L Mastectomy  . MASTECTOMY    . ORIF PATELLA Left 04/12/2015   Procedure: OPEN REDUCTION INTERNAL (ORIF) FIXATION PATELLA;  Surgeon: Hessie Knows, MD;  Location: [facility] ORS;  Service: Orthopedics;  Laterality: Left;  . PERIPHERAL VASCULAR  CATHETERIZATION Left 04/17/2015   Procedure: Lower Extremity Angiography;  Surgeon: Katha Cabal, MD;  Location: [facility];  Service: Cardiovascular;  Laterality: Left;  . PERIPHERAL VASCULAR CATHETERIZATION  04/17/2015   Procedure: Lower Extremity Intervention;  Surgeon: Katha Cabal, MD;  Location: [facility];  Service: Cardiovascular;;  . TONSILLECTOMY      Prior to Admission medications   Medication Sig Start Date End Date Taking? Authorizing Provider  acetaminophen (TYLENOL) 325 MG tablet Take 2 tablets (650 mg total) by mouth every 6 (six) hours as needed for mild pain (or Fever >/= 101). 04/16/15   Reche Dixon, PA-C  Ascorbic Acid (VITAMIN C PO) Take 1 capsule by mouth daily.    Historical Provider, MD  Calcium Carbonate-Vitamin D (CALCIUM + D PO) Take 1 tablet by mouth 2 (two) times daily.    Historical Provider, MD  clindamycin (CLEOCIN) 300 MG capsule Take 1 capsule (300 mg total) by mouth 3 (three) times daily. 07/10/16   Theodoro Grist, MD  clobetasol (TEMOVATE) 0.05 % GEL Apply 1 application topically as needed (if gums bleed after brushing teeth).     Historical Provider, MD  fluocinonide gel (LIDEX) [id number] % Apply 1 application topically as needed (after brushing teeth if they do not bleed).     Historical Provider, MD  Fluticasone-Salmeterol (ADVAIR) 250-50 MCG/DOSE AEPB Inhale 1 puff into the lungs 2 (two) times daily.    Historical Provider, MD  hydrochlorothiazide (HYDRODIURIL) 25 MG tablet  Take 25 mg by mouth daily.    Historical Provider, MD  Multiple Vitamins-Minerals (CENTRUM SILVER PO) Take 1 tablet by mouth daily.    Historical Provider, MD  omeprazole (PRILOSEC) 40 MG capsule Take 40 mg by mouth daily.     Historical Provider, MD  potassium chloride SA (K-DUR,KLOR-CON) 20 MEQ tablet Take 20 mEq by mouth 2 (two) times daily.     Historical Provider, MD  raloxifene (EVISTA) 60 MG tablet Take 60 mg by mouth daily.    Historical Provider, MD  ramipril  (ALTACE) 10 MG capsule Take 20 mg by mouth daily.     Historical Provider, MD  theophylline (THEO-24) 100 MG 24 hr capsule Take 100 mg by mouth 2 (two) times daily.    Historical Provider, MD  tiotropium (SPIRIVA) 18 MCG inhalation capsule Place 18 mcg into inhaler and inhale 2 (two) times daily.    Historical Provider, MD    Allergies  Allergen Reactions  . Amlodipine Swelling     [Onset: 08/26/2013]  . Codeine Nausea Only  . Sulfa Antibiotics Rash and Nausea Only    Family History  Problem Relation Age of Onset  . CVA Mother   . Kidney disease Mother   . Heart disease Father     Social History Social History  Substance Use Topics  . Smoking status: Former Smoker    Packs/day: 0.50    Years: 20.00    Types: Cigarettes    Quit date: 01/26/1979  . Smokeless tobacco: Never Used  . Alcohol use No    Review of Systems Constitutional: Negative for fever. Cardiovascular: Negative for chest pain. Respiratory: Negative for shortness of breath. Gastrointestinal: Negative for abdominal pain Genitourinary: Negative for dysuria. Musculoskeletal: Negative for back pain. Negative for neck pain. Skin: Laceration and abrasions to forehead. Skin tear to left hand. Neurological: Negative for headache 10-point ROS otherwise negative.  ____________________________________________   PHYSICAL EXAM:  VITAL SIGNS: ED Triage Vitals  Enc Vitals Group     BP 08/01/16 1556 (!) 198/102     Pulse Rate 08/01/16 1556 82     Resp 08/01/16 1556 18     Temp 08/01/16 1556 98.3 F (36.8 C)     Temp Source 08/01/16 1556 Oral     SpO2 08/01/16 1556 100 %     Weight 08/01/16 1557 130 lb (59 kg)     Height 08/01/16 1557 5\' 6"  (1.676 m)     Head Circumference --      Peak Flow --      Pain Score 08/01/16 1557 2     Pain Loc --      Pain Edu? --      Excl. in [place]? --     Constitutional: Alert and oriented. Well appearing and in no distress. Eyes: Normal exam ENT   Head: 2 lacerations to  the right forehead, one of which with an arterial bleed.   Nose: No congestion/rhinnorhea.   Mouth/Throat: Mucous membranes are moist. Cardiovascular: Normal rate, regular rhythm. No murmur Respiratory: Normal respiratory effort without tachypnea nor retractions. Breath sounds are clear  Gastrointestinal: Soft and nontender. No distention.  Musculoskeletal: Nontender with normal range of motion in all extremities. Skin tear to left hand. No C-spine tenderness. Neurologic:  Normal speech and language. No gross focal neurologic deficits  Skin:  Skin is warm, dry and intact. affect are normal.   ____________________________________________    INITIAL IMPRESSION / ASSESSMENT AND PLAN / ED COURSE  Pertinent labs & imaging  results that were available during my care of the patient were reviewed by me and considered in my medical decision making (see chart for details).  The patient presents to the emergency department after mechanical fall onto cement suffering 2 lacerations to the forehead and a skin tear to the left hand. One of the forehead lacerations is approximately 0.5 cm but has not arterial bleed which is easily controllable with a mild amount of pressure. Several nitrate and a small amount Surgicel applied to the wound with pressure, and hemostasis was achieved after approximately 5-10 minutes. Covered with Dermabond. The second laceration is approximately 2 cm in length, largely hemostatic. 4 5-0 rapid Vicryl sutures were placed. Patient has a skin tear approximately 2.5 cm to the left hand, very thin skin, Xeroform applied and wound was dressed. I discussed with the patient obtaining a head CT to rule out intracranial bleed. Patient has a history of subdural past. She states she takes a baby aspirin as her only blood thinner. She is strongly against a CT scan of the head. She states she has had multiple CT scans in the past and they are always normal. She states she sees no need in  having to have a CT scan of head when she is not nauseous denies any headache and states she feels normal. Discussed with the patient given her age and obvious trauma to the head that she is taking a risk I not getting a CT scan, the patient understands this and says if she develops a headache weakness/numbness or nausea she will return to the ER, but again states she does not want a CT scan at this time.   LACERATION REPAIR Performed by: Harvest Dark Authorized by: Harvest Dark Consent: Verbal consent obtained. Risks and benefits: risks, benefits and alternatives were discussed Consent given by: patient Patient identity confirmed: provided demographic data Prepped and Draped in normal sterile fashion Wound explored  Laceration Location: right forehead  Laceration Length: 2cm  No Foreign Bodies seen or palpated  Anesthesia: local infiltration  Local anesthetic: lidocaine 1% w/o epinephrine  Anesthetic total: 4 ml  Irrigation method: syringe Amount of cleaning: standard  Skin closure: sutures  Number of sutures: 4 5-0 rapid vicryl  Technique: simple interrupted  Patient tolerance: Patient tolerated the procedure well with no immediate complications.   ____________________________________________   FINAL CLINICAL IMPRESSION(S) / ED DIAGNOSES  Fall Laceration    Harvest Dark, MD 08/01/16 [id number]

## 2016-08-01 NOTE — Discharge Instructions (Signed)
As we discussed you have opted not to obtain a head CT today. Please return immediately to the emergency department immediately if you develop any headache, nausea, or weakness/numbness. The sutures that were placed in your forehead today are absorbable, please keep dry for 24 hours. Please follow-up with your doctor in 2-3 days for recheck/reevaluation.

## 2016-08-01 NOTE — ED Notes (Signed)
Skin tear to RT hand cleaned with sterile water and dressed with Xeroform at this time

## 2016-08-06 DIAGNOSIS — Y92099 Unspecified place in other non-institutional residence as the place of occurrence of the external cause: Secondary | ICD-10-CM | POA: Diagnosis not present

## 2016-08-06 DIAGNOSIS — W19XXXA Unspecified fall, initial encounter: Secondary | ICD-10-CM | POA: Diagnosis not present

## 2016-08-06 DIAGNOSIS — S60511A Abrasion of right hand, initial encounter: Secondary | ICD-10-CM | POA: Diagnosis not present

## 2016-08-06 DIAGNOSIS — S0181XA Laceration without foreign body of other part of head, initial encounter: Secondary | ICD-10-CM | POA: Diagnosis not present

## 2016-08-12 DIAGNOSIS — L905 Scar conditions and fibrosis of skin: Secondary | ICD-10-CM | POA: Diagnosis not present

## 2016-08-12 DIAGNOSIS — D0439 Carcinoma in situ of skin of other parts of face: Secondary | ICD-10-CM | POA: Diagnosis not present

## 2016-08-19 ENCOUNTER — Encounter: Payer: Self-pay | Admitting: *Deleted

## 2016-08-19 DIAGNOSIS — W01198A Fall on same level from slipping, tripping and stumbling with subsequent striking against other object, initial encounter: Secondary | ICD-10-CM | POA: Diagnosis not present

## 2016-08-19 DIAGNOSIS — I10 Essential (primary) hypertension: Secondary | ICD-10-CM | POA: Diagnosis not present

## 2016-08-19 DIAGNOSIS — J45909 Unspecified asthma, uncomplicated: Secondary | ICD-10-CM | POA: Diagnosis not present

## 2016-08-19 DIAGNOSIS — Y999 Unspecified external cause status: Secondary | ICD-10-CM | POA: Diagnosis not present

## 2016-08-19 DIAGNOSIS — Z87891 Personal history of nicotine dependence: Secondary | ICD-10-CM | POA: Insufficient documentation

## 2016-08-19 DIAGNOSIS — Y92254 Theater (live) as the place of occurrence of the external cause: Secondary | ICD-10-CM | POA: Insufficient documentation

## 2016-08-19 DIAGNOSIS — Z79899 Other long term (current) drug therapy: Secondary | ICD-10-CM | POA: Diagnosis not present

## 2016-08-19 DIAGNOSIS — Y939 Activity, unspecified: Secondary | ICD-10-CM | POA: Insufficient documentation

## 2016-08-19 DIAGNOSIS — Z853 Personal history of malignant neoplasm of breast: Secondary | ICD-10-CM | POA: Diagnosis not present

## 2016-08-19 DIAGNOSIS — S41111A Laceration without foreign body of right upper arm, initial encounter: Secondary | ICD-10-CM | POA: Diagnosis not present

## 2016-08-19 DIAGNOSIS — D0471 Carcinoma in situ of skin of right lower limb, including hip: Secondary | ICD-10-CM | POA: Diagnosis not present

## 2016-08-19 DIAGNOSIS — J449 Chronic obstructive pulmonary disease, unspecified: Secondary | ICD-10-CM | POA: Diagnosis not present

## 2016-08-19 DIAGNOSIS — D0462 Carcinoma in situ of skin of left upper limb, including shoulder: Secondary | ICD-10-CM | POA: Diagnosis not present

## 2016-08-19 NOTE — ED Triage Notes (Signed)
Pt tripped and caught herself from falling.  Pt has a skin tear to right upper arm with swelling and bruising.  Bleeding controlled.  Pt did not fall.  Pt alert.

## 2016-08-20 ENCOUNTER — Emergency Department
Admission: EM | Admit: 2016-08-20 | Discharge: 2016-08-20 | Disposition: A | Discharge status: Home / Self Care | Payer: PPO | Attending: Student | Admitting: Student

## 2016-08-20 DIAGNOSIS — W19XXXA Unspecified fall, initial encounter: Secondary | ICD-10-CM

## 2016-08-20 DIAGNOSIS — S41111A Laceration without foreign body of right upper arm, initial encounter: Secondary | ICD-10-CM

## 2016-08-20 NOTE — ED Provider Notes (Signed)
[facility] Emergency Department Provider Note   ____________________________________________   First MD Initiated Contact with Patient 08/20/16 0045     (approximate)  I have reviewed the triage vital signs and the nursing notes.   HISTORY  Chief Complaint Laceration    HPI Melanie Huffman is a 82 y.o. female history of COPD, hypertension, on baby ASA but otherwise not chronically anticoagulated who presents for evaluation of skin tear of the right upper arm after mechanical fall this evening just prior to arrival, sudden onset, constant, no modifying factors, moderate. Patient was at a theater viewing a silent movie. She reports that she stumbled and tried to put her arm out to catch herself however hit her arm on a marble/hard piece of furniture, lacerating the right upper arm. She denies any other pain or injury. She denies any head injury, or loss of consciousness. She denies any chest pain, difficulty breathing or recent illness include no vomiting, diarrhea, fevers or chills. She is otherwise been in her usual state of health without illness and she reports her tetanus vaccine is up-to-date.   Past Medical History:  Diagnosis Date  . Asthma   . Cancer ([place]) 1991   L Breast  . COPD (chronic obstructive pulmonary disease) ([place])   . Heart murmur   . Hypertension   . Peripheral vascular disease ([place])   . Shortness of breath dyspnea   . Traumatic subdural hematoma [facility])     Patient Active Problem List   Diagnosis Date Noted  . Hyponatremia 07/10/2016  . Dehydration 07/10/2016  . Acute renal insufficiency 07/10/2016  . Hypokalemia 07/10/2016  . Cellulitis of leg, left 07/09/2016  . Patellar fracture 04/11/2015  . Subdural hematoma ([place]) 07/26/2014    Past Surgical History:  Procedure Laterality Date  . ABDOMINAL HYSTERECTOMY     Partial  . APPENDECTOMY    . BREAST BIOPSY Right    1980 and 1970's  . BREAST SURGERY  1991   L Mastectomy  .  MASTECTOMY    . ORIF PATELLA Left 04/12/2015   Procedure: OPEN REDUCTION INTERNAL (ORIF) FIXATION PATELLA;  Surgeon: Hessie Knows, MD;  Location: [facility] ORS;  Service: Orthopedics;  Laterality: Left;  . PERIPHERAL VASCULAR CATHETERIZATION Left 04/17/2015   Procedure: Lower Extremity Angiography;  Surgeon: Katha Cabal, MD;  Location: [place] [facility];  Service: Cardiovascular;  Laterality: Left;  . PERIPHERAL VASCULAR CATHETERIZATION  04/17/2015   Procedure: Lower Extremity Intervention;  Surgeon: Katha Cabal, MD;  Location: [place] [facility];  Service: Cardiovascular;;  . TONSILLECTOMY      Prior to Admission medications   Medication Sig Start Date End Date Taking? Authorizing Provider  acetaminophen (TYLENOL) 325 MG tablet Take 2 tablets (650 mg total) by mouth every 6 (six) hours as needed for mild pain (or Fever >/= 101). 04/16/15   Reche Dixon, PA-C  Ascorbic Acid (VITAMIN C PO) Take 1 capsule by mouth daily.    Historical Provider, MD  Calcium Carbonate-Vitamin D (CALCIUM + D PO) Take 1 tablet by mouth 2 (two) times daily.    Historical Provider, MD  clindamycin (CLEOCIN) 300 MG capsule Take 1 capsule (300 mg total) by mouth 3 (three) times daily. 07/10/16   Theodoro Grist, MD  clobetasol (TEMOVATE) 0.05 % GEL Apply 1 application topically as needed (if gums bleed after brushing teeth).     Historical Provider, MD  fluocinonide gel (LIDEX) AB-123456789 % Apply 1 application topically as needed (after brushing teeth if  they do not bleed).     Historical Provider, MD  Fluticasone-Salmeterol (ADVAIR) 250-50 MCG/DOSE AEPB Inhale 1 puff into the lungs 2 (two) times daily.    Historical Provider, MD  hydrochlorothiazide (HYDRODIURIL) 25 MG tablet Take 25 mg by mouth daily.    Historical Provider, MD  Multiple Vitamins-Minerals (CENTRUM SILVER PO) Take 1 tablet by mouth daily.    Historical Provider, MD  omeprazole (PRILOSEC) 40 MG capsule Take 40 mg by mouth daily.     Historical Provider, MD    potassium chloride SA (K-DUR,KLOR-CON) 20 MEQ tablet Take 20 mEq by mouth 2 (two) times daily.     Historical Provider, MD  raloxifene (EVISTA) 60 MG tablet Take 60 mg by mouth daily.    Historical Provider, MD  ramipril (ALTACE) 10 MG capsule Take 20 mg by mouth daily.     Historical Provider, MD  theophylline (THEO-24) 100 MG 24 hr capsule Take 100 mg by mouth 2 (two) times daily.    Historical Provider, MD  tiotropium (SPIRIVA) 18 MCG inhalation capsule Place 18 mcg into inhaler and inhale 2 (two) times daily.    Historical Provider, MD    Allergies Amlodipine; Codeine; and Sulfa antibiotics  Family History  Problem Relation Age of Onset  . CVA Mother   . Kidney disease Mother   . Heart disease Father     Social History Social History  Substance Use Topics  . Smoking status: Former Smoker    Packs/day: 0.50    Years: 20.00    Types: Cigarettes    Quit date: 01/26/1979  . Smokeless tobacco: Never Used  . Alcohol use No    Review of Systems Constitutional: No fever/chills Eyes: No visual changes. ENT: No sore throat. Cardiovascular: Denies chest pain. Respiratory: Denies shortness of breath. Gastrointestinal: No abdominal pain.  No nausea, no vomiting.  No diarrhea.  No constipation. Genitourinary: Negative for dysuria. Musculoskeletal: Negative for back pain. Skin: Negative for rash. Neurological: Negative for headaches, focal weakness or numbness.  10-point ROS otherwise negative.  ____________________________________________   PHYSICAL EXAM:  Vitals:   08/19/16 2313 08/19/16 2315 08/20/16 0120  BP:  (!) 188/118 (!) 191/95  Pulse: 79  74  Resp: 20  18  Temp: 97.9 F (36.6 C)  97.6 F (36.4 C)  TempSrc: Oral  Oral  SpO2: 96%  95%  Weight:  118 lb (53.5 kg)   Height:  5\' 5"  (1.651 m)     VITAL SIGNS: ED Triage Vitals  Enc Vitals Group     BP 08/19/16 2315 (!) 188/118     Pulse Rate 08/19/16 2313 79     Resp 08/19/16 2313 20     Temp 08/19/16 2313  97.9 F (36.6 C)     Temp Source 08/19/16 2313 Oral     SpO2 08/19/16 2313 96 %     Weight 08/19/16 2315 118 lb (53.5 kg)     Height 08/19/16 2315 5\' 5"  (1.651 m)     Head Circumference --      Peak Flow --      Pain Score 08/19/16 2315 1     Pain Loc --      Pain Edu? --      Excl. in [place]? --     Constitutional: Alert and oriented. Well appearing and in no acute distress. Eyes: Conjunctivae are normal. PERRL. EOMI. Head: Atraumatic. Nose: No congestion/rhinnorhea. Mouth/Throat: Mucous membranes are moist.  Oropharynx non-erythematous. Neck: No stridor. No C-spine tenderness to palpation.  Cardiovascular: Normal rate, regular rhythm. Grossly normal heart sounds.  Good peripheral circulation. Respiratory: Normal respiratory effort.  No retractions. Lungs CTAB. Gastrointestinal: Soft and nontender. No distention.  No CVA tenderness. Genitourinary: deferred Musculoskeletal: No lower extremity tenderness nor edema.  No joint effusions. No midline T or L-spine tenderness to palpation. Pelvis stable to rock and compression. There is a 4-5 cm curvilinear superficial skin tear in the right upper arm which is hemostatic, there is associated ecchymosis without appreciable hematoma, no bony tenderness or abnormality. Full active painless range of motion at the right shoulder, right elbow, right wrist. 2+ right radial pulse, right radial median and ulnar nerve are intact. Neurologic:  Normal speech and language. No gross focal neurologic deficits are appreciated. No gait instability. Skin:  Skin is warm, dry and intact. No rash noted. Psychiatric: Mood and affect are normal. Speech and behavior are normal.  ____________________________________________   LABS (all labs ordered are listed, but only abnormal results are displayed)  Labs Reviewed - No data to  display ____________________________________________  EKG  none ____________________________________________  RADIOLOGY  none ____________________________________________   PROCEDURES  Procedure(s) performed: None    Procedures  Critical Care performed: No  ____________________________________________   INITIAL IMPRESSION / ASSESSMENT AND PLAN / ED COURSE  Pertinent labs & imaging results that were available during my care of the patient were reviewed by me and considered in my medical decision making (see chart for details).  GLEDA CANTONE is a 82 y.o. female history of COPD, hypertension, not chronically anticoagulated who presents for evaluation of skin tear of the right upper arm after mechanical fall this evening just prior to arrival. On exam, she is very well-appearing and in no acute distress. Initial triage vital signs were notable for hypertension however this improved without any intervention and patient has a history of hypertension and she is asymptomatic. She is encouraged to take her home medications follow-up with PCP for recheck. She has a superficial hemostatic curvilinear skin tear in the right upper arm, I applied Steri-Strips after irrigating the wound and a bandage was placed by me. She is neurovascularly intact in the right arm, she is not complaining of any pain and does not have any bony tenderness, no indication for x-ray. We discussed meticulous return precautions and need for close PCP follow-up and she is comfortable with the discharge plan. DC home.  Clinical Course     ____________________________________________   FINAL CLINICAL IMPRESSION(S) / ED DIAGNOSES  Final diagnoses:  Skin tear of upper arm without complication, right, initial encounter  Fall, initial encounter      NEW MEDICATIONS STARTED DURING THIS VISIT:  New Prescriptions   No medications on file     Note:  This document was prepared using Dragon voice recognition  software and may include unintentional dictation errors.    Joanne Gavel, MD 08/20/16 [phone]

## 2016-08-22 DIAGNOSIS — S41111D Laceration without foreign body of right upper arm, subsequent encounter: Secondary | ICD-10-CM | POA: Diagnosis not present

## 2016-08-22 DIAGNOSIS — J449 Chronic obstructive pulmonary disease, unspecified: Secondary | ICD-10-CM | POA: Diagnosis not present

## 2016-08-22 DIAGNOSIS — I10 Essential (primary) hypertension: Secondary | ICD-10-CM | POA: Diagnosis not present

## 2016-08-25 DIAGNOSIS — Z23 Encounter for immunization: Secondary | ICD-10-CM | POA: Diagnosis not present

## 2016-08-25 DIAGNOSIS — S41111D Laceration without foreign body of right upper arm, subsequent encounter: Secondary | ICD-10-CM | POA: Diagnosis not present

## 2016-08-28 DIAGNOSIS — I10 Essential (primary) hypertension: Secondary | ICD-10-CM | POA: Diagnosis not present

## 2016-08-28 DIAGNOSIS — I739 Peripheral vascular disease, unspecified: Secondary | ICD-10-CM | POA: Diagnosis not present

## 2016-08-28 DIAGNOSIS — I70222 Atherosclerosis of native arteries of extremities with rest pain, left leg: Secondary | ICD-10-CM | POA: Diagnosis not present

## 2016-08-28 DIAGNOSIS — I70248 Atherosclerosis of native arteries of left leg with ulceration of other part of lower left leg: Secondary | ICD-10-CM | POA: Diagnosis not present

## 2016-08-28 DIAGNOSIS — M79609 Pain in unspecified limb: Secondary | ICD-10-CM | POA: Diagnosis not present

## 2016-08-28 DIAGNOSIS — I70213 Atherosclerosis of native arteries of extremities with intermittent claudication, bilateral legs: Secondary | ICD-10-CM | POA: Diagnosis not present

## 2016-08-28 DIAGNOSIS — I70211 Atherosclerosis of native arteries of extremities with intermittent claudication, right leg: Secondary | ICD-10-CM | POA: Diagnosis not present

## 2016-08-28 DIAGNOSIS — J449 Chronic obstructive pulmonary disease, unspecified: Secondary | ICD-10-CM | POA: Diagnosis not present

## 2016-09-01 ENCOUNTER — Ambulatory Visit: Payer: PPO

## 2016-09-10 ENCOUNTER — Ambulatory Visit
Admission: RE | Admit: 2016-09-10 | Discharge: 2016-09-10 | Disposition: A | Discharge status: Home / Self Care | Payer: PPO | Source: Ambulatory Visit | Attending: Family Medicine | Admitting: Family Medicine

## 2016-09-10 DIAGNOSIS — Z1231 Encounter for screening mammogram for malignant neoplasm of breast: Secondary | ICD-10-CM | POA: Diagnosis not present

## 2016-09-10 HISTORY — DX: Malignant neoplasm of unspecified site of unspecified female breast: C50.919

## 2016-09-22 DIAGNOSIS — L600 Ingrowing nail: Secondary | ICD-10-CM | POA: Diagnosis not present

## 2016-10-09 DIAGNOSIS — J449 Chronic obstructive pulmonary disease, unspecified: Secondary | ICD-10-CM | POA: Diagnosis not present

## 2016-10-09 DIAGNOSIS — R0609 Other forms of dyspnea: Secondary | ICD-10-CM | POA: Diagnosis not present

## 2016-10-09 DIAGNOSIS — I10 Essential (primary) hypertension: Secondary | ICD-10-CM | POA: Diagnosis not present

## 2016-10-10 DIAGNOSIS — I10 Essential (primary) hypertension: Secondary | ICD-10-CM | POA: Diagnosis not present

## 2016-10-13 ENCOUNTER — Emergency Department: Payer: PPO

## 2016-10-13 ENCOUNTER — Emergency Department
Admission: EM | Admit: 2016-10-13 | Discharge: 2016-10-13 | Disposition: A | Discharge status: Home / Self Care | Payer: PPO | Attending: Emergency Medicine | Admitting: Emergency Medicine

## 2016-10-13 ENCOUNTER — Encounter: Payer: Self-pay | Admitting: Intensive Care

## 2016-10-13 DIAGNOSIS — I10 Essential (primary) hypertension: Secondary | ICD-10-CM | POA: Insufficient documentation

## 2016-10-13 DIAGNOSIS — R1013 Epigastric pain: Secondary | ICD-10-CM | POA: Insufficient documentation

## 2016-10-13 DIAGNOSIS — Z79899 Other long term (current) drug therapy: Secondary | ICD-10-CM | POA: Diagnosis not present

## 2016-10-13 DIAGNOSIS — J449 Chronic obstructive pulmonary disease, unspecified: Secondary | ICD-10-CM | POA: Insufficient documentation

## 2016-10-13 DIAGNOSIS — Z87891 Personal history of nicotine dependence: Secondary | ICD-10-CM | POA: Diagnosis not present

## 2016-10-13 DIAGNOSIS — R0600 Dyspnea, unspecified: Secondary | ICD-10-CM | POA: Diagnosis not present

## 2016-10-13 DIAGNOSIS — R1010 Upper abdominal pain, unspecified: Secondary | ICD-10-CM | POA: Diagnosis not present

## 2016-10-13 DIAGNOSIS — Z853 Personal history of malignant neoplasm of breast: Secondary | ICD-10-CM | POA: Diagnosis not present

## 2016-10-13 DIAGNOSIS — J45909 Unspecified asthma, uncomplicated: Secondary | ICD-10-CM | POA: Diagnosis not present

## 2016-10-13 LAB — CBC
HEMATOCRIT: 42.9 % (ref 35.0–47.0)
Hemoglobin: 14.2 g/dL (ref 12.0–16.0)
MCH: 30.3 pg (ref 26.0–34.0)
MCHC: 33.1 g/dL (ref 32.0–36.0)
MCV: 91.3 fL (ref 80.0–100.0)
PLATELETS: 159 10*3/uL (ref 150–440)
RBC: 4.69 MIL/uL (ref 3.80–5.20)
RDW: 13.5 % (ref 11.5–14.5)
WBC: 5.2 10*3/uL (ref 3.6–11.0)

## 2016-10-13 LAB — URINALYSIS COMPLETE WITH MICROSCOPIC (ARMC ONLY)
Bilirubin Urine: NEGATIVE
Glucose, UA: NEGATIVE mg/dL
HGB URINE DIPSTICK: NEGATIVE
Ketones, ur: NEGATIVE mg/dL
LEUKOCYTES UA: NEGATIVE
NITRITE: NEGATIVE
PH: 7 (ref 5.0–8.0)
PROTEIN: NEGATIVE mg/dL
RBC / HPF: NONE SEEN RBC/hpf (ref 0–5)
SQUAMOUS EPITHELIAL / LPF: NONE SEEN
Specific Gravity, Urine: 1.003 — ABNORMAL LOW (ref 1.005–1.030)

## 2016-10-13 LAB — COMPREHENSIVE METABOLIC PANEL
ALBUMIN: 3.9 g/dL (ref 3.5–5.0)
ALT: 16 U/L (ref 14–54)
AST: 33 U/L (ref 15–41)
Alkaline Phosphatase: 48 U/L (ref 38–126)
Anion gap: 7 (ref 5–15)
BUN: 10 mg/dL (ref 6–20)
CHLORIDE: 101 mmol/L (ref 101–111)
CO2: 25 mmol/L (ref 22–32)
CREATININE: 0.82 mg/dL (ref 0.44–1.00)
Calcium: 9.1 mg/dL (ref 8.9–10.3)
GFR calc Af Amer: >60 mL/min (ref >60)
GLUCOSE: 103 mg/dL — AB (ref 65–99)
Potassium: 4.6 mmol/L (ref 3.5–5.1)
Sodium: 133 mmol/L — ABNORMAL LOW (ref 135–145)
Total Bilirubin: 0.7 mg/dL (ref 0.3–1.2)
Total Protein: 7 g/dL (ref 6.5–8.1)

## 2016-10-13 LAB — TROPONIN I: Troponin I: <0.03 ng/mL (ref <0.03)

## 2016-10-13 LAB — LIPASE, BLOOD: LIPASE: 19 U/L (ref 11–51)

## 2016-10-13 NOTE — ED Notes (Signed)
Pt reports that her BP was elevated last Thursday when she went to see her pulmonologist and he referred her back to her PCP - she started having pain across mid to upper abd off and on with shortness of breath last Thursday but did not go and see her PCP as advised - she called the PCP today and he stated for her to come to the er for eval

## 2016-10-13 NOTE — ED Notes (Signed)
Pt unable to void at this time - she is aware that urine sample is needed and will call for assistance to bathroom when she has to void

## 2016-10-13 NOTE — ED Provider Notes (Signed)
[facility] Emergency Department Provider Note  Time seen: 5:43 PM  I have reviewed the triage vital signs and the nursing notes.   HISTORY  Chief Complaint Abdominal Pain    HPI Melanie Huffman is a 82 y.o. female with a past medical history of COPD, hypertension, asthma, who presents the emergency department for epigastric pain and shortness breath. According to the patient she went to see her pulmonologist for mildly increased shortness breath over the past one week. He thought that this could be blood pressure related, as her blood pressure was greater than [id number] systolic. She also notes over the past several months she is having intermittent epigastric pain which she states is gastric reflux, states it resolves with one omeprazole every time she has a. States she has not had it in over one week. Denies any nausea or diaphoresis. Patient states her pulmonologist wanted her to come to the emergency department for a quicker workup. Patient denies any cough, congestion, fever. Denies any leg pain or swelling.  Past Medical History:  Diagnosis Date  . Asthma   . Breast cancer ([place]) 1991   mastectomy left  . Cancer ([place]) 1991   L Breast  . COPD (chronic obstructive pulmonary disease) ([place])   . Heart murmur   . Hypertension   . Peripheral vascular disease ([place])   . Shortness of breath dyspnea   . Traumatic subdural hematoma [facility])     Patient Active Problem List   Diagnosis Date Noted  . Hyponatremia 07/10/2016  . Dehydration 07/10/2016  . Acute renal insufficiency 07/10/2016  . Hypokalemia 07/10/2016  . Cellulitis of leg, left 07/09/2016  . Patellar fracture 04/11/2015  . Subdural hematoma ([place]) 07/26/2014    Past Surgical History:  Procedure Laterality Date  . ABDOMINAL HYSTERECTOMY     Partial  . APPENDECTOMY    . BREAST BIOPSY Right    1980 and 1970's  . BREAST SURGERY  1991   L Mastectomy  . MASTECTOMY    . ORIF PATELLA Left 04/12/2015   Procedure: OPEN REDUCTION INTERNAL (ORIF) FIXATION PATELLA;  Surgeon: Hessie Knows, MD;  Location: [facility] ORS;  Service: Orthopedics;  Laterality: Left;  . PERIPHERAL VASCULAR CATHETERIZATION Left 04/17/2015   Procedure: Lower Extremity Angiography;  Surgeon: Katha Cabal, MD;  Location: [place] [facility];  Service: Cardiovascular;  Laterality: Left;  . PERIPHERAL VASCULAR CATHETERIZATION  04/17/2015   Procedure: Lower Extremity Intervention;  Surgeon: Katha Cabal, MD;  Location: [place] [facility];  Service: Cardiovascular;;  . TONSILLECTOMY      Prior to Admission medications   Medication Sig Start Date End Date Taking? Authorizing Provider  acetaminophen (TYLENOL) 325 MG tablet Take 2 tablets (650 mg total) by mouth every 6 (six) hours as needed for mild pain (or Fever >/= 101). 04/16/15   Reche Dixon, PA-C  Ascorbic Acid (VITAMIN C PO) Take 1 capsule by mouth daily.    Historical Provider, MD  Calcium Carbonate-Vitamin D (CALCIUM + D PO) Take 1 tablet by mouth 2 (two) times daily.    Historical Provider, MD  clindamycin (CLEOCIN) 300 MG capsule Take 1 capsule (300 mg total) by mouth 3 (three) times daily. 07/10/16   Theodoro Grist, MD  clobetasol (TEMOVATE) 0.05 % GEL Apply 1 application topically as needed (if gums bleed after brushing teeth).     Historical Provider, MD  fluocinonide gel (LIDEX) [id number] % Apply 1 application topically as needed (after brushing teeth if they do not bleed).  Historical Provider, MD  Fluticasone-Salmeterol (ADVAIR) 250-50 MCG/DOSE AEPB Inhale 1 puff into the lungs 2 (two) times daily.    Historical Provider, MD  hydrochlorothiazide (HYDRODIURIL) 25 MG tablet Take 25 mg by mouth daily.    Historical Provider, MD  Multiple Vitamins-Minerals (CENTRUM SILVER PO) Take 1 tablet by mouth daily.    Historical Provider, MD  omeprazole (PRILOSEC) 40 MG capsule Take 40 mg by mouth daily.     Historical Provider, MD  potassium chloride SA (K-DUR,KLOR-CON) 20 MEQ  tablet Take 20 mEq by mouth 2 (two) times daily.     Historical Provider, MD  raloxifene (EVISTA) 60 MG tablet Take 60 mg by mouth daily.    Historical Provider, MD  ramipril (ALTACE) 10 MG capsule Take 20 mg by mouth daily.     Historical Provider, MD  theophylline (THEO-24) 100 MG 24 hr capsule Take 100 mg by mouth 2 (two) times daily.    Historical Provider, MD  tiotropium (SPIRIVA) 18 MCG inhalation capsule Place 18 mcg into inhaler and inhale 2 (two) times daily.    Historical Provider, MD    Allergies  Allergen Reactions  . Amlodipine Swelling     [Onset: 08/26/2013]  . Codeine Nausea Only  . Sulfa Antibiotics Rash and Nausea Only    Family History  Problem Relation Age of Onset  . CVA Mother   . Kidney disease Mother   . Heart disease Father   . Kidney disease Cousin 11  . Kidney disease Cousin 82  . Breast cancer Neg Hx     Social History Social History  Substance Use Topics  . Smoking status: Former Smoker    Packs/day: 0.50    Years: 20.00    Types: Cigarettes    Quit date: 01/26/1979  . Smokeless tobacco: Never Used  . Alcohol use No    Review of Systems Constitutional: Negative for fever. Cardiovascular: Intermittent epigastric pain. Respiratory: History of shortness of breath, patient states it is mildly increased over the past 1 week. Gastrointestinal: Negative for abdominal pain Musculoskeletal: Negative for back pain. Neurological: Negative for headache 10-point ROS otherwise negative.  ____________________________________________   PHYSICAL EXAM:  VITAL SIGNS: ED Triage Vitals  Enc Vitals Group     BP 10/13/16 1413 (!) 213/102     Pulse Rate 10/13/16 1413 81     Resp 10/13/16 1413 16     Temp 10/13/16 1413 97.9 F (36.6 C)     Temp Source 10/13/16 1413 Oral     SpO2 10/13/16 1413 98 %     Weight 10/13/16 1414 117 lb (53.1 kg)     Height 10/13/16 1414 5\' 4"  (1.626 m)     Head Circumference --      Peak Flow --      Pain Score 10/13/16  1710 0     Pain Loc --      Pain Edu? --      Excl. in [place]? --     Constitutional: Alert and oriented. Well appearing and in no distress. Eyes: Normal exam ENT   Head: Normocephalic and atraumatic.   Mouth/Throat: Mucous membranes are moist. Cardiovascular: Normal rate, regular rhythm. No murmur Respiratory: Normal respiratory effort without tachypnea nor retractions. Breath sounds are clear  Gastrointestinal: Soft and nontender. No distention.  Musculoskeletal: Nontender with normal range of motion in all extremities. No lower extremity tenderness or edema. Neurologic:  Normal speech and language. No gross focal neurologic deficits  Skin:  Skin is warm, dry and  intact.  Psychiatric: Mood and affect are normal.   ____________________________________________    EKG  EKG reviewed and interpreted by myself shows normal sinus rhythm at 75 bpm, narrow QS, normal axis, normal intervals, no concerning ST changes.  ____________________________________________    RADIOLOGY  Chest x-ray is negative  ____________________________________________   INITIAL IMPRESSION / ASSESSMENT AND PLAN / ED COURSE  Pertinent labs & imaging results that were available during my care of the patient were reviewed by me and considered in my medical decision making (see chart for details).  Patient presents the emergency department for increased shortness of breath over the past 1 week, along with intermittent epigastric pain. Patient states she has been experiencing the epigastric pain for years at this point, she believes it to be gastric reflux, completely resolved with omeprazole. Patient states her pulmonologist was concerned and wanted her to come to the emergent department for a workup. Patient states her shortness of breath is on mildly increased, no distress or trouble breathing in the emergent department can talk in full sentences. However given the intermittent epigastric pain we will check  labs including cardiac enzymes, obtained a chest x-ray and close monitor. Patient's EKG is very reassuring. Patient denies any shortness of breath or epigastric/chest pain currently.  Chest x-ray is negative. Labs are largely within normal limits. Troponin is negative. Patient continues to be symptom free in the emergency department. Blood pressure around 0000000 systolic. I discussed with the patient following up with a primary care doctor by calling tomorrow to discuss her blood pressure management further. Patient will be discharged from the emergency department this time. I discussed return precautions, patient is agreeable.  ____________________________________________   FINAL CLINICAL IMPRESSION(S) / ED DIAGNOSES  Dyspnea Epigastric pain    Harvest Dark, MD 10/13/16 1857

## 2016-10-13 NOTE — Discharge Instructions (Signed)
Please call your primary care doctor tomorrow to discuss her blood pressure further. Return to the emergency department for any chest pain, increased trouble breathing, or any other symptom personally concerning to yourself.

## 2016-10-13 NOTE — ED Triage Notes (Addendum)
Patient reports having upper abdominal pain that started over the weekend. Patient reports taking omeprazole and it takes her pain away. Patient states "I thought it was just acid reflux but I am not sure if it is chest pain or not" Denies any N/V/D. HX of Left sided mastectomy

## 2016-10-15 DIAGNOSIS — I10 Essential (primary) hypertension: Secondary | ICD-10-CM | POA: Diagnosis not present

## 2016-10-17 ENCOUNTER — Encounter: Payer: Self-pay | Admitting: Emergency Medicine

## 2016-10-17 ENCOUNTER — Emergency Department
Admission: EM | Admit: 2016-10-17 | Discharge: 2016-10-17 | Disposition: A | Discharge status: Home / Self Care | Payer: PPO | Attending: Emergency Medicine | Admitting: Emergency Medicine

## 2016-10-17 ENCOUNTER — Other Ambulatory Visit: Payer: Self-pay | Admitting: *Deleted

## 2016-10-17 DIAGNOSIS — Z87891 Personal history of nicotine dependence: Secondary | ICD-10-CM | POA: Diagnosis not present

## 2016-10-17 DIAGNOSIS — I10 Essential (primary) hypertension: Secondary | ICD-10-CM | POA: Diagnosis not present

## 2016-10-17 DIAGNOSIS — Z791 Long term (current) use of non-steroidal anti-inflammatories (NSAID): Secondary | ICD-10-CM | POA: Insufficient documentation

## 2016-10-17 DIAGNOSIS — J45909 Unspecified asthma, uncomplicated: Secondary | ICD-10-CM | POA: Insufficient documentation

## 2016-10-17 DIAGNOSIS — Z79899 Other long term (current) drug therapy: Secondary | ICD-10-CM | POA: Insufficient documentation

## 2016-10-17 DIAGNOSIS — J449 Chronic obstructive pulmonary disease, unspecified: Secondary | ICD-10-CM | POA: Insufficient documentation

## 2016-10-17 MED ORDER — CLONIDINE HCL 0.1 MG PO TABS: 0.1000 mg | ORAL_TABLET | Freq: Two times a day (BID) | ORAL | 0 refills | Status: DC

## 2016-10-17 MED ORDER — CLONIDINE HCL 0.1 MG PO TABS
0.1000 mg | ORAL_TABLET | Freq: Once | ORAL | Status: AC
  Administered 2016-10-17: 0.1 mg via ORAL
  Filled 2016-10-17: qty 1

## 2016-10-17 NOTE — Discharge Instructions (Signed)
Please decrease your hydralazine to once a day. All of your other medications stay the same. Please take the clonidine (new medication) as prescribed.   You will need to see Dr. Netty Starring to determine if your medication needs to be further adjusted. Remember, blood pressure can be tricky but we will get it under control!

## 2016-10-17 NOTE — Patient Outreach (Signed)
[facility] [facility]) Care Management  10/17/2016  TARAJAH MCKERNAN 06-19-1928 [id number]   RN Health Coach  Attempted screening  outreach call to patient.  Patient was unavailable.[employer] Compl[employer]  voice mail message left with return call back number. Plan: RN will call patient again within 14 days.  [place] Care Management [phone]

## 2016-10-17 NOTE — ED Notes (Signed)
ED Provider at bedside. 

## 2016-10-17 NOTE — ED Triage Notes (Signed)
Pt reports blood pressure has been elevated over last week. Today systolic was over [id number] per pt. Denies any sx r/t BP including CP/SHOB.

## 2016-10-17 NOTE — Patient Outreach (Signed)
[place] [facility]) Care Management  10/17/2016  Melanie Huffman 06/10/1928 [id number]  RN Health Coach received  telephone call from patient.  [employer] compliance verified. Per patient she is waiting for a call from her Dr. Per patient she wanted to discuss [facility] with her Dr and call me back.  [place] Care Management [phone]

## 2016-10-17 NOTE — ED Provider Notes (Signed)
[facility] Emergency Department Provider Note   ____________________________________________    I have reviewed the triage vital signs and the nursing notes.   HISTORY  Chief Complaint Hypertension     HPI Melanie Huffman is a 82 y.o. female who presents with complaints of high blood pressure. Patient was seen here recently for similar complaints. She followed up with her PCP who increased her medications but she checked it again today and found that it was elevated. She denies symptoms. She feels well overall. No headache, no chest pain, no shortness of breath. No change in vision. She reports compliance with her medications. She reports she called her PCPs office and they directed her to the emergency department   Past Medical History:  Diagnosis Date  . Asthma   . Breast cancer ([place]) 1991   mastectomy left  . Cancer ([place]) 1991   L Breast  . COPD (chronic obstructive pulmonary disease) ([place])   . Heart murmur   . Hypertension   . Peripheral vascular disease ([place])   . Shortness of breath dyspnea   . Traumatic subdural hematoma [facility])     Patient Active Problem List   Diagnosis Date Noted  . Hyponatremia 07/10/2016  . Dehydration 07/10/2016  . Acute renal insufficiency 07/10/2016  . Hypokalemia 07/10/2016  . Cellulitis of leg, left 07/09/2016  . Patellar fracture 04/11/2015  . Subdural hematoma ([place]) 07/26/2014    Past Surgical History:  Procedure Laterality Date  . ABDOMINAL HYSTERECTOMY     Partial  . APPENDECTOMY    . BREAST BIOPSY Right    1980 and 1970's  . BREAST SURGERY  1991   L Mastectomy  . MASTECTOMY    . ORIF PATELLA Left 04/12/2015   Procedure: OPEN REDUCTION INTERNAL (ORIF) FIXATION PATELLA;  Surgeon: Hessie Knows, MD;  Location: [facility] ORS;  Service: Orthopedics;  Laterality: Left;  . PERIPHERAL VASCULAR CATHETERIZATION Left 04/17/2015   Procedure: Lower Extremity Angiography;  Surgeon: Katha Cabal, MD;  Location:  [place] CV LAB;  Service: Cardiovascular;  Laterality: Left;  . PERIPHERAL VASCULAR CATHETERIZATION  04/17/2015   Procedure: Lower Extremity Intervention;  Surgeon: Katha Cabal, MD;  Location: [place] CV LAB;  Service: Cardiovascular;;  . TONSILLECTOMY      Prior to Admission medications   Medication Sig Start Date End Date Taking? Authorizing Provider  acetaminophen (TYLENOL) 325 MG tablet Take 2 tablets (650 mg total) by mouth every 6 (six) hours as needed for mild pain (or Fever >/= 101). 04/16/15   Reche Dixon, PA-C  Ascorbic Acid (VITAMIN C PO) Take 1 capsule by mouth daily.    Historical Provider, MD  Calcium Carbonate-Vitamin D (CALCIUM + D PO) Take 1 tablet by mouth 2 (two) times daily.    Historical Provider, MD  clindamycin (CLEOCIN) 300 MG capsule Take 1 capsule (300 mg total) by mouth 3 (three) times daily. 07/10/16   Theodoro Grist, MD  clobetasol (TEMOVATE) 0.05 % GEL Apply 1 application topically as needed (if gums bleed after brushing teeth).     Historical Provider, MD  cloNIDine (CATAPRES) 0.1 MG tablet Take 1 tablet (0.1 mg total) by mouth 2 (two) times daily. 10/17/16 10/17/17  Lavonia Drafts, MD  fluocinonide gel (LIDEX) [id number] % Apply 1 application topically as needed (after brushing teeth if they do not bleed).     Historical Provider, MD  Fluticasone-Salmeterol (ADVAIR) 250-50 MCG/DOSE AEPB Inhale 1 puff into the lungs 2 (two) times daily.  Historical Provider, MD  hydrochlorothiazide (HYDRODIURIL) 25 MG tablet Take 25 mg by mouth daily.    Historical Provider, MD  Multiple Vitamins-Minerals (CENTRUM SILVER PO) Take 1 tablet by mouth daily.    Historical Provider, MD  omeprazole (PRILOSEC) 40 MG capsule Take 40 mg by mouth daily.     Historical Provider, MD  potassium chloride SA (K-DUR,KLOR-CON) 20 MEQ tablet Take 20 mEq by mouth 2 (two) times daily.     Historical Provider, MD  raloxifene (EVISTA) 60 MG tablet Take 60 mg by mouth daily.    Historical  Provider, MD  ramipril (ALTACE) 10 MG capsule Take 20 mg by mouth daily.     Historical Provider, MD  theophylline (THEO-24) 100 MG 24 hr capsule Take 100 mg by mouth 2 (two) times daily.    Historical Provider, MD  tiotropium (SPIRIVA) 18 MCG inhalation capsule Place 18 mcg into inhaler and inhale 2 (two) times daily.    Historical Provider, MD     Allergies Amlodipine; Codeine; and Sulfa antibiotics  Family History  Problem Relation Age of Onset  . CVA Mother   . Kidney disease Mother   . Heart disease Father   . Kidney disease Cousin 56  . Kidney disease Cousin 29  . Breast cancer Neg Hx     Social History Social History  Substance Use Topics  . Smoking status: Former Smoker    Packs/day: 0.50    Years: 20.00    Types: Cigarettes    Quit date: 01/26/1979  . Smokeless tobacco: Never Used  . Alcohol use No    Review of Systems  Constitutional: No fever/chills Eyes: No visual changes.   Cardiovascular: Denies chest pain. Respiratory: Denies shortness of breath. Gastrointestinal: No abdominal pain.  No nausea, no vomiting.    Musculoskeletal: Negative for back pain. Skin: Negative for rash. Neurological: Negative for headaches or weakness  10-point ROS otherwise negative.  ____________________________________________   PHYSICAL EXAM:  VITAL SIGNS: ED Triage Vitals  Enc Vitals Group     BP 10/17/16 1654 (!) 206/90     Pulse Rate 10/17/16 1654 78     Resp 10/17/16 1654 18     Temp 10/17/16 1654 97.9 F (36.6 C)     Temp Source 10/17/16 1654 Oral     SpO2 10/17/16 1654 100 %     Weight 10/17/16 1655 117 lb (53.1 kg)     Height 10/17/16 1655 5\' 4"  (1.626 m)     Head Circumference --      Peak Flow --      Pain Score 10/17/16 1933 0     Pain Loc --      Pain Edu? --      Excl. in [place]? --     Constitutional: Alert and oriented. No acute distress.  Eyes: Conjunctivae are normal.   Nose: No congestion/rhinnorhea. Mouth/Throat: Mucous membranes are  moist.    Cardiovascular: Normal rate, regular rhythm. Grossly normal heart sounds.  Good peripheral circulation. Respiratory: Normal respiratory effort.  No retractions. Lungs CTAB. Gastrointestinal: Soft and nontender. No distention.  No CVA tenderness. Genitourinary: deferred Musculoskeletal: No lower extremity tenderness nor edema.  Warm and well perfused Neurologic:  Normal speech and language. No gross focal neurologic deficits are appreciated.  Skin:  Skin is warm, dry and intact. No rash noted. Psychiatric: Mood and affect are normal. Speech and behavior are normal.  ____________________________________________   LABS (all labs ordered are listed, but only abnormal results are displayed)  Labs  Reviewed - No data to display ____________________________________________  EKG  None ____________________________________________  RADIOLOGY  None ____________________________________________   PROCEDURES  Procedure(s) performed: No    Critical Care performed: No ____________________________________________   INITIAL IMPRESSION / ASSESSMENT AND PLAN / ED COURSE  Pertinent labs & imaging results that were available during my care of the patient were reviewed by me and considered in my medical decision making (see chart for details).  Patient had normal lab work and x-ray and EKG done less than one week ago. She is asymptomatic today. We will give by mouth clonidine and reevaluate  Clinical Course   Blood pressure has improved somewhat. Patient remains asymptomatic. We will start clonidine twice a day and decrease her hydralazine. She will follow-up with her PCP for further medication adjustments ____________________________________________   FINAL CLINICAL IMPRESSION(S) / ED DIAGNOSES  Final diagnoses:  Essential hypertension      NEW MEDICATIONS STARTED DURING THIS VISIT:  Discharge Medication List as of 10/17/2016  7:25 PM    START taking these  medications   Details  cloNIDine (CATAPRES) 0.1 MG tablet Take 1 tablet (0.1 mg total) by mouth 2 (two) times daily., Starting Fri 10/17/2016, Until Sat 10/17/2017, Print         Note:  This document was prepared using Dragon voice recognition software and may include unintentional dictation errors.    Lavonia Drafts, MD 10/17/16 2114

## 2016-10-17 NOTE — ED Notes (Signed)
Patient was seen here Tuesday for her Blood pressure also and d/c home. Patient states she called her PCP and they told her to monitor her B/P throughout the day and if it did not decrease below [id number] systolic to come to the ER. Staff at PCP also told patient she would need to come in for an scheduled appointment after visit to the ER for reevaluation on medicines

## 2016-10-20 ENCOUNTER — Encounter: Payer: Self-pay | Admitting: Emergency Medicine

## 2016-10-20 ENCOUNTER — Emergency Department
Admission: EM | Admit: 2016-10-20 | Discharge: 2016-10-21 | Disposition: A | Discharge status: Home / Self Care | Payer: PPO | Attending: Emergency Medicine | Admitting: Emergency Medicine

## 2016-10-20 ENCOUNTER — Other Ambulatory Visit: Payer: Self-pay | Admitting: *Deleted

## 2016-10-20 DIAGNOSIS — Z79899 Other long term (current) drug therapy: Secondary | ICD-10-CM | POA: Diagnosis not present

## 2016-10-20 DIAGNOSIS — I10 Essential (primary) hypertension: Secondary | ICD-10-CM | POA: Diagnosis not present

## 2016-10-20 DIAGNOSIS — Z87891 Personal history of nicotine dependence: Secondary | ICD-10-CM | POA: Insufficient documentation

## 2016-10-20 DIAGNOSIS — J449 Chronic obstructive pulmonary disease, unspecified: Secondary | ICD-10-CM | POA: Diagnosis not present

## 2016-10-20 DIAGNOSIS — J45909 Unspecified asthma, uncomplicated: Secondary | ICD-10-CM | POA: Insufficient documentation

## 2016-10-20 DIAGNOSIS — Z853 Personal history of malignant neoplasm of breast: Secondary | ICD-10-CM | POA: Diagnosis not present

## 2016-10-20 LAB — CBC
HEMATOCRIT: 40.5 % (ref 35.0–47.0)
HEMOGLOBIN: 13.8 g/dL (ref 12.0–16.0)
MCH: 31.1 pg (ref 26.0–34.0)
MCHC: 34.1 g/dL (ref 32.0–36.0)
MCV: 91.3 fL (ref 80.0–100.0)
Platelets: 156 10*3/uL (ref 150–440)
RBC: 4.43 MIL/uL (ref 3.80–5.20)
RDW: 13 % (ref 11.5–14.5)
WBC: 4.7 10*3/uL (ref 3.6–11.0)

## 2016-10-20 LAB — COMPREHENSIVE METABOLIC PANEL
ALK PHOS: 56 U/L (ref 38–126)
ALT: 21 U/L (ref 14–54)
AST: 31 U/L (ref 15–41)
Albumin: 3.9 g/dL (ref 3.5–5.0)
Anion gap: 7 (ref 5–15)
BILIRUBIN TOTAL: 0.8 mg/dL (ref 0.3–1.2)
BUN: 13 mg/dL (ref 6–20)
CALCIUM: 8.9 mg/dL (ref 8.9–10.3)
CHLORIDE: 98 mmol/L — AB (ref 101–111)
CO2: 26 mmol/L (ref 22–32)
CREATININE: 0.59 mg/dL (ref 0.44–1.00)
GFR calc Af Amer: >60 mL/min (ref >60)
Glucose, Bld: 108 mg/dL — ABNORMAL HIGH (ref 65–99)
Potassium: 4.1 mmol/L (ref 3.5–5.1)
Sodium: 131 mmol/L — ABNORMAL LOW (ref 135–145)
TOTAL PROTEIN: 6.5 g/dL (ref 6.5–8.1)

## 2016-10-20 LAB — TROPONIN I

## 2016-10-20 MED ORDER — CLONIDINE HCL 0.1 MG PO TABS
0.1000 mg | ORAL_TABLET | Freq: Once | ORAL | Status: AC
  Administered 2016-10-21: 0.1 mg via ORAL
  Filled 2016-10-20: qty 1

## 2016-10-20 NOTE — Patient Outreach (Signed)
[place] [facility]) Care Management  10/20/2016  Melanie Huffman 06-14-1928 [id number]   RN Health Coach received voicemail message from patient requesting call back. RN Health attempted call to patient.  Patient was unavailable. HIPPA compliance voicemail message was left with return callback number.    [place] Care Management [phone]

## 2016-10-20 NOTE — ED Notes (Signed)
ED Provider at bedside. 

## 2016-10-20 NOTE — ED Notes (Signed)
Pt c/o elevated blood pressure tonight, pt reports for the past 10 days BP has been fluctuating Started on Clonidine by PCP on Friday no help. Pt reports had systolic BP go up to Q000111Q tonight. Pt reports taking BP medicines today no changes in BP. Pt denies chest pain, shortness of breath, blurred vision, or headache.  Pt has no other complaints at this time.

## 2016-10-20 NOTE — ED Triage Notes (Signed)
Pt ambulatory to triage with steady gait with c/o elevated blood pressure tonight, pt reports for the past 10 days bp has been fluctuating. Pt reports this is her 3rd visit for same symptoms. Pt reports had systolic bp Q000111Q tonight. Pt reports taking bp medicines today. Pt denies chest pain, shortness of breath, or headache. Pt alert and oriented x 4, no increased work in breathing noted. Skin warm and dry.

## 2016-10-21 ENCOUNTER — Other Ambulatory Visit: Payer: Self-pay | Admitting: *Deleted

## 2016-10-21 DIAGNOSIS — J449 Chronic obstructive pulmonary disease, unspecified: Secondary | ICD-10-CM | POA: Diagnosis not present

## 2016-10-21 DIAGNOSIS — F411 Generalized anxiety disorder: Secondary | ICD-10-CM | POA: Diagnosis not present

## 2016-10-21 DIAGNOSIS — I10 Essential (primary) hypertension: Secondary | ICD-10-CM | POA: Diagnosis not present

## 2016-10-21 DIAGNOSIS — R0602 Shortness of breath: Secondary | ICD-10-CM | POA: Diagnosis not present

## 2016-10-21 LAB — TROPONIN I

## 2016-10-21 MED ORDER — CLONIDINE HCL 0.1 MG PO TABS: 0.1000 mg | ORAL_TABLET | Freq: Once | ORAL | Status: DC

## 2016-10-21 NOTE — Patient Outreach (Signed)
[place] [facility]) Care Management  10/21/2016  Melanie Huffman [birth year] [id number]  RN Health Coach returned  telephone call to patient.  Hipaa compliance verified. Per patient she was not ready to go through the telephone screening because she had to go to lunch at 12 N. Patient would like to be called back. Plan: RN will call patient again within 14 days.  [place] Care Management [phone]

## 2016-10-21 NOTE — ED Notes (Signed)
Discharge instructions reviewed with patient. Questions fielded by this RN. Patient verbalizes understanding of instructions. Patient discharged home in stable condition per [place] MD . No acute distress noted at time of discharge.   

## 2016-10-21 NOTE — ED Provider Notes (Signed)
[facility] Emergency Department Provider Note   ____________________________________________   First MD Initiated Contact with Patient 10/20/16 2338     (approximate)  I have reviewed the triage vital signs and the nursing notes.   HISTORY  Chief Complaint Hypertension    HPI Melanie Huffman is a 82 y.o. female who comes into the hospital today with elevated blood pressure. She reports her blood pressure has been all over the place for the last 10 days. This is her third visit in the last few days with elevated blood pressure. The patient's highest blood pressure today systolic was [id number]. She was started on clonidine on Friday and has been taking it twice daily. She did stop taking the amount of hydrochlorothiazide she had been taking previously. She is reports that her blood pressure is normally Q000111Q systolic. Her primary care physician told her that if it got over 180 she needed to come into the ER. She reports that the previous ER doctor told her not to take as much medication. The patient reports that there is some confusion about the different medication she is supposed be taking in all of the changes. She was told today that she may need to see a cardiologist but they had not yet called her back with an appointment. She reports that she gets warm but denies any chest pain, headache, dizziness, blurred vision, abdominal pain. She has had some shortness of breath when she exerts herself but her pulmonologist said that her lungs were okay. The patient reports that she has been following everything in her blood pressure still high so she is here for evaluation today.   Past Medical History:  Diagnosis Date  . Asthma   . Breast cancer ([place]) 1991   mastectomy left  . Cancer ([place]) 1991   L Breast  . COPD (chronic obstructive pulmonary disease) ([place])   . Heart murmur   . Hypertension   . Peripheral vascular disease ([place])   . Shortness of breath dyspnea   .  Traumatic subdural hematoma [facility])     Patient Active Problem List   Diagnosis Date Noted  . Hyponatremia 07/10/2016  . Dehydration 07/10/2016  . Acute renal insufficiency 07/10/2016  . Hypokalemia 07/10/2016  . Cellulitis of leg, left 07/09/2016  . Patellar fracture 04/11/2015  . Subdural hematoma ([place]) 07/26/2014    Past Surgical History:  Procedure Laterality Date  . ABDOMINAL HYSTERECTOMY     Partial  . APPENDECTOMY    . BREAST BIOPSY Right    1980 and 1970's  . BREAST SURGERY  1991   L Mastectomy  . MASTECTOMY    . ORIF PATELLA Left 04/12/2015   Procedure: OPEN REDUCTION INTERNAL (ORIF) FIXATION PATELLA;  Surgeon: Hessie Knows, MD;  Location: [facility] ORS;  Service: Orthopedics;  Laterality: Left;  . PERIPHERAL VASCULAR CATHETERIZATION Left 04/17/2015   Procedure: Lower Extremity Angiography;  Surgeon: Katha Cabal, MD;  Location: [place] CV LAB;  Service: Cardiovascular;  Laterality: Left;  . PERIPHERAL VASCULAR CATHETERIZATION  04/17/2015   Procedure: Lower Extremity Intervention;  Surgeon: Katha Cabal, MD;  Location: [place] CV LAB;  Service: Cardiovascular;;  . TONSILLECTOMY      Prior to Admission medications   Medication Sig Start Date End Date Taking? Authorizing Provider  acetaminophen (TYLENOL) 325 MG tablet Take 2 tablets (650 mg total) by mouth every 6 (six) hours as needed for mild pain (or Fever >/= 101). 04/16/15   Reche Dixon, PA-C  Ascorbic Acid (  VITAMIN C PO) Take 1 capsule by mouth daily.    Historical Provider, MD  Calcium Carbonate-Vitamin D (CALCIUM + D PO) Take 1 tablet by mouth 2 (two) times daily.    Historical Provider, MD  clindamycin (CLEOCIN) 300 MG capsule Take 1 capsule (300 mg total) by mouth 3 (three) times daily. 07/10/16   Theodoro Grist, MD  clobetasol (TEMOVATE) 0.05 % GEL Apply 1 application topically as needed (if gums bleed after brushing teeth).     Historical Provider, MD  cloNIDine (CATAPRES) 0.1 MG tablet Take 1 tablet  (0.1 mg total) by mouth 2 (two) times daily. 10/17/16 10/17/17  Lavonia Drafts, MD  fluocinonide gel (LIDEX) [id number] % Apply 1 application topically as needed (after brushing teeth if they do not bleed).     Historical Provider, MD  Fluticasone-Salmeterol (ADVAIR) 250-50 MCG/DOSE AEPB Inhale 1 puff into the lungs 2 (two) times daily.    Historical Provider, MD  hydrochlorothiazide (HYDRODIURIL) 25 MG tablet Take 25 mg by mouth daily.    Historical Provider, MD  Multiple Vitamins-Minerals (CENTRUM SILVER PO) Take 1 tablet by mouth daily.    Historical Provider, MD  omeprazole (PRILOSEC) 40 MG capsule Take 40 mg by mouth daily.     Historical Provider, MD  potassium chloride SA (K-DUR,KLOR-CON) 20 MEQ tablet Take 20 mEq by mouth 2 (two) times daily.     Historical Provider, MD  raloxifene (EVISTA) 60 MG tablet Take 60 mg by mouth daily.    Historical Provider, MD  ramipril (ALTACE) 10 MG capsule Take 20 mg by mouth daily.     Historical Provider, MD  theophylline (THEO-24) 100 MG 24 hr capsule Take 100 mg by mouth 2 (two) times daily.    Historical Provider, MD  tiotropium (SPIRIVA) 18 MCG inhalation capsule Place 18 mcg into inhaler and inhale 2 (two) times daily.    Historical Provider, MD    Allergies Amlodipine; Codeine; and Sulfa antibiotics  Family History  Problem Relation Age of Onset  . CVA Mother   . Kidney disease Mother   . Heart disease Father   . Kidney disease Cousin 36  . Kidney disease Cousin 57  . Breast cancer Neg Hx     Social History Social History  Substance Use Topics  . Smoking status: Former Smoker    Packs/day: 0.50    Years: 20.00    Types: Cigarettes    Quit date: 01/26/1979  . Smokeless tobacco: Never Used  . Alcohol use No    Review of Systems Constitutional: Elevated blood pressure Eyes: No visual changes. ENT: No sore throat. Cardiovascular: Denies chest pain. Respiratory: Denies shortness of breath. Gastrointestinal: No abdominal pain.  No nausea,  no vomiting.  No diarrhea.  No constipation. Genitourinary: Negative for dysuria. Musculoskeletal: Negative for back pain. Skin: Negative for rash. Neurological: Negative for headaches, focal weakness or numbness.  10-point ROS otherwise negative.  ____________________________________________   PHYSICAL EXAM:  VITAL SIGNS: ED Triage Vitals  Enc Vitals Group     BP 10/20/16 1921 (!) 209/97     Pulse Rate 10/20/16 1921 81     Resp 10/20/16 1921 18     Temp 10/20/16 1921 98.3 F (36.8 C)     Temp Source 10/20/16 1921 Oral     SpO2 10/20/16 1921 95 %     Weight 10/20/16 1922 117 lb (53.1 kg)     Height 10/20/16 1922 5' 4.5" (1.638 m)     Head Circumference --  Peak Flow --      Pain Score 10/20/16 2340 0     Pain Loc --      Pain Edu? --      Excl. in [place]? --     Constitutional: Alert and oriented. Well appearing and in Mild distress. Eyes: Conjunctivae are normal. PERRL. EOMI. Head: Atraumatic. Nose: No congestion/rhinnorhea. Mouth/Throat: Mucous membranes are moist.  Oropharynx non-erythematous. Cardiovascular: Normal rate, regular rhythm. Grossly normal heart sounds.  Good peripheral circulation. Respiratory: Normal respiratory effort.  No retractions. Lungs CTAB. Gastrointestinal: Soft and nontender. No distention. Positive bowel sounds Musculoskeletal: No lower extremity tenderness nor edema.   Neurologic:  Normal speech and language. Cranial nerves II through XII grossly intact with no focal motor or neuro deficits Skin:  Skin is warm, dry and intact. Psychiatric: Mood and affect are normal.   ____________________________________________   LABS (all labs ordered are listed, but only abnormal results are displayed)  Labs Reviewed  COMPREHENSIVE METABOLIC PANEL - Abnormal; Notable for the following:       Result Value   Sodium 131 (*)    Chloride 98 (*)    Glucose, Bld 108 (*)    All other components within normal limits  CBC  TROPONIN I  TROPONIN I    ____________________________________________  EKG  ED ECG REPORT I, Loney Hering, the attending physician, personally viewed and interpreted this ECG.   Date: 10/21/2016  EKG Time: 1934  Rate: 70  Rhythm: normal sinus rhythm  Axis: normal  Intervals:none  ST&T Change: none  ____________________________________________  RADIOLOGY  none ____________________________________________   PROCEDURES  Procedure(s) performed: None  Procedures  Critical Care performed: No  ____________________________________________   INITIAL IMPRESSION / ASSESSMENT AND PLAN / ED COURSE  Pertinent labs & imaging results that were available during my care of the patient were reviewed by me and considered in my medical decision making (see chart for details).  This is an 82 year old female who comes into the hospital today with some elevated blood pressure. The patient has been battling this for multiple days. I will give the patient a dose of clonidine which worked previously and I will repeat the patient's troponin. Her initial blood work was unremarkable. I did inform the patient that she should follow-up with the cardiologist who might be able to get her blood pressure in a more stable pattern. The patient has no other complaints at this time.  Clinical Course    After some time in the emergency department and one dose of clonidine the patient's blood pressure was improved. The patient last blood pressure was 167/93. She will be discharged home.  ____________________________________________   FINAL CLINICAL IMPRESSION(S) / ED DIAGNOSES  Final diagnoses:  Hypertension, unspecified type      NEW MEDICATIONS STARTED DURING THIS VISIT:  New Prescriptions   No medications on file     Note:  This document was prepared using Dragon voice recognition software and may include unintentional dictation errors.    Loney Hering, MD 10/21/16 [id number]

## 2016-10-21 NOTE — Discharge Instructions (Signed)
Please increase your clonidine to 3 times a day. Please follow-up with Dr. Ubaldo Glassing

## 2016-10-23 ENCOUNTER — Emergency Department
Admission: EM | Admit: 2016-10-23 | Discharge: 2016-10-23 | Disposition: A | Discharge status: Home / Self Care | Payer: PPO | Attending: Emergency Medicine | Admitting: Emergency Medicine

## 2016-10-23 ENCOUNTER — Encounter: Payer: Self-pay | Admitting: Emergency Medicine

## 2016-10-23 DIAGNOSIS — I10 Essential (primary) hypertension: Secondary | ICD-10-CM | POA: Diagnosis not present

## 2016-10-23 DIAGNOSIS — Z87891 Personal history of nicotine dependence: Secondary | ICD-10-CM | POA: Diagnosis not present

## 2016-10-23 DIAGNOSIS — J449 Chronic obstructive pulmonary disease, unspecified: Secondary | ICD-10-CM | POA: Diagnosis not present

## 2016-10-23 DIAGNOSIS — Z85820 Personal history of malignant melanoma of skin: Secondary | ICD-10-CM | POA: Diagnosis not present

## 2016-10-23 DIAGNOSIS — J45909 Unspecified asthma, uncomplicated: Secondary | ICD-10-CM | POA: Insufficient documentation

## 2016-10-23 DIAGNOSIS — Z79899 Other long term (current) drug therapy: Secondary | ICD-10-CM | POA: Diagnosis not present

## 2016-10-23 LAB — BASIC METABOLIC PANEL
Anion gap: 7 (ref 5–15)
BUN: 11 mg/dL (ref 6–20)
CO2: 22 mmol/L (ref 22–32)
CREATININE: 0.71 mg/dL (ref 0.44–1.00)
Calcium: 9.1 mg/dL (ref 8.9–10.3)
Chloride: 101 mmol/L (ref 101–111)
GFR calc Af Amer: >60 mL/min (ref >60)
Glucose, Bld: 109 mg/dL — ABNORMAL HIGH (ref 65–99)
Potassium: 4.2 mmol/L (ref 3.5–5.1)
SODIUM: 130 mmol/L — AB (ref 135–145)

## 2016-10-23 LAB — CBC
HEMATOCRIT: 39.4 % (ref 35.0–47.0)
HEMOGLOBIN: 13.7 g/dL (ref 12.0–16.0)
MCH: 31.5 pg (ref 26.0–34.0)
MCHC: 34.8 g/dL (ref 32.0–36.0)
MCV: 90.4 fL (ref 80.0–100.0)
PLATELETS: 148 10*3/uL — AB (ref 150–440)
RBC: 4.36 MIL/uL (ref 3.80–5.20)
RDW: 13 % (ref 11.5–14.5)
WBC: 4.6 10*3/uL (ref 3.6–11.0)

## 2016-10-23 LAB — TROPONIN I

## 2016-10-23 MED ORDER — CLONIDINE HCL 0.1 MG PO TABS: 0.1000 mg | ORAL_TABLET | Freq: Three times a day (TID) | ORAL | 0 refills | Status: DC

## 2016-10-23 MED ORDER — CLONIDINE HCL 0.1 MG PO TABS
0.1000 mg | ORAL_TABLET | Freq: Once | ORAL | Status: AC
  Administered 2016-10-23: 0.1 mg via ORAL
  Filled 2016-10-23: qty 1

## 2016-10-23 NOTE — ED Provider Notes (Signed)
[facility] Emergency Department Provider Note  ____________________________________________  Time seen: Approximately 10:46 AM  I have reviewed the triage vital signs and the nursing notes.   HISTORY  Chief Complaint Hypertension   HPI Melanie Huffman is a 82 y.o. female history of hypertension, peripheral vascular disease, COPD who presents for evaluation of elevated blood pressure. This patient's fourth visit since 10/17/16 for elevated blood pressure. She was seen by Dr. Ubaldo Glassing, cardiology two days ago and was started on clonidine 0.1mg  BID. Patient is also on hydralazine 25mg  BID, ramipril 20mg  daily, and coreg 25mg  BID. There seems to have some confusion in the last week about the doses that patient should be taking however now she seems to be taking the correct doses. She was instructed to return to the ER if her BP [id number] systolic and this morning it was 205/130 two hours after she had taken all her morning meds. Patient reports that she feels flushed and that is how she knows her BP is high. She denies headache, facial droop, slurred speech, weakness or numbness, chest pain, shortness of breath, palpitations, abdominal pain, nausea, vomiting, back pain associated with her elevated blood pressure.  Past Medical History:  Diagnosis Date  . Asthma   . Breast cancer ([place]) 1991   mastectomy left  . Cancer ([place]) 1991   L Breast  . COPD (chronic obstructive pulmonary disease) ([place])   . Heart murmur   . Hypertension   . Peripheral vascular disease ([place])   . Shortness of breath dyspnea   . Traumatic subdural hematoma [facility])     Patient Active Problem List   Diagnosis Date Noted  . Hyponatremia 07/10/2016  . Dehydration 07/10/2016  . Acute renal insufficiency 07/10/2016  . Hypokalemia 07/10/2016  . Cellulitis of leg, left 07/09/2016  . Patellar fracture 04/11/2015  . Subdural hematoma ([place]) 07/26/2014    Past Surgical History:  Procedure Laterality Date   . ABDOMINAL HYSTERECTOMY     Partial  . APPENDECTOMY    . BREAST BIOPSY Right    1980 and 1970's  . BREAST SURGERY  1991   L Mastectomy  . MASTECTOMY    . ORIF PATELLA Left 04/12/2015   Procedure: OPEN REDUCTION INTERNAL (ORIF) FIXATION PATELLA;  Surgeon: Hessie Knows, MD;  Location: [facility] ORS;  Service: Orthopedics;  Laterality: Left;  . PERIPHERAL VASCULAR CATHETERIZATION Left 04/17/2015   Procedure: Lower Extremity Angiography;  Surgeon: Katha Cabal, MD;  Location: [place] CV LAB;  Service: Cardiovascular;  Laterality: Left;  . PERIPHERAL VASCULAR CATHETERIZATION  04/17/2015   Procedure: Lower Extremity Intervention;  Surgeon: Katha Cabal, MD;  Location: [place] CV LAB;  Service: Cardiovascular;;  . TONSILLECTOMY      Prior to Admission medications   Medication Sig Start Date End Date Taking? Authorizing Provider  acetaminophen (TYLENOL) 325 MG tablet Take 2 tablets (650 mg total) by mouth every 6 (six) hours as needed for mild pain (or Fever >/= 101). 04/16/15   Reche Dixon, PA-C  Ascorbic Acid (VITAMIN C PO) Take 1 capsule by mouth daily.    Historical Provider, MD  Calcium Carbonate-Vitamin D (CALCIUM + D PO) Take 1 tablet by mouth 2 (two) times daily.    Historical Provider, MD  clindamycin (CLEOCIN) 300 MG capsule Take 1 capsule (300 mg total) by mouth 3 (three) times daily. 07/10/16   Theodoro Grist, MD  clobetasol (TEMOVATE) 0.05 % GEL Apply 1 application topically as needed (if gums bleed after brushing teeth).  Historical Provider, MD  cloNIDine (CATAPRES) 0.1 MG tablet Take 1 tablet (0.1 mg total) by mouth 3 (three) times daily. 10/23/16 10/23/17  Rudene Re, MD  fluocinonide gel (LIDEX) [id number] % Apply 1 application topically as needed (after brushing teeth if they do not bleed).     Historical Provider, MD  Fluticasone-Salmeterol (ADVAIR) 250-50 MCG/DOSE AEPB Inhale 1 puff into the lungs 2 (two) times daily.    Historical Provider, MD    hydrochlorothiazide (HYDRODIURIL) 25 MG tablet Take 25 mg by mouth daily.    Historical Provider, MD  Multiple Vitamins-Minerals (CENTRUM SILVER PO) Take 1 tablet by mouth daily.    Historical Provider, MD  omeprazole (PRILOSEC) 40 MG capsule Take 40 mg by mouth daily.     Historical Provider, MD  potassium chloride SA (K-DUR,KLOR-CON) 20 MEQ tablet Take 20 mEq by mouth 2 (two) times daily.     Historical Provider, MD  raloxifene (EVISTA) 60 MG tablet Take 60 mg by mouth daily.    Historical Provider, MD  ramipril (ALTACE) 10 MG capsule Take 20 mg by mouth daily.     Historical Provider, MD  theophylline (THEO-24) 100 MG 24 hr capsule Take 100 mg by mouth 2 (two) times daily.    Historical Provider, MD  tiotropium (SPIRIVA) 18 MCG inhalation capsule Place 18 mcg into inhaler and inhale 2 (two) times daily.    Historical Provider, MD    Allergies Amlodipine; Codeine; and Sulfa antibiotics  Family History  Problem Relation Age of Onset  . CVA Mother   . Kidney disease Mother   . Heart disease Father   . Kidney disease Cousin 71  . Kidney disease Cousin 59  . Breast cancer Neg Hx     Social History Social History  Substance Use Topics  . Smoking status: Former Smoker    Packs/day: 0.50    Years: 20.00    Types: Cigarettes    Quit date: 01/26/1979  . Smokeless tobacco: Never Used  . Alcohol use No    Review of Systems  Constitutional: Negative for fever. + flushing Eyes: Negative for visual changes. ENT: Negative for sore throat. Neck: No neck pain  Cardiovascular: Negative for chest pain. Respiratory: Negative for shortness of breath. Gastrointestinal: Negative for abdominal pain, vomiting or diarrhea. Genitourinary: Negative for dysuria. Musculoskeletal: Negative for back pain. Skin: Negative for rash. Neurological: Negative for headaches, weakness or numbness. Psych: No SI or HI  ____________________________________________   PHYSICAL EXAM:  VITAL SIGNS: ED  Triage Vitals  Enc Vitals Group     BP 10/23/16 1032 (!) 197/90     Pulse Rate 10/23/16 1032 69     Resp 10/23/16 1032 16     Temp 10/23/16 1032 98.6 F (37 C)     Temp Source 10/23/16 1032 Oral     SpO2 10/23/16 1032 98 %     Weight 10/23/16 1033 117 lb (53.1 kg)     Height 10/23/16 1033 5\' 4"  (1.626 m)     Head Circumference --      Peak Flow --      Pain Score 10/23/16 1033 0     Pain Loc --      Pain Edu? --      Excl. in [place]? --     Constitutional: Alert and oriented. Well appearing and in no apparent distress. HEENT:      Head: Normocephalic and atraumatic.         Eyes: Conjunctivae are normal. Sclera is non-icteric. EOMI.  PERRL      Mouth/Throat: Mucous membranes are moist.       Neck: Supple with no signs of meningismus. Cardiovascular: Regular rate and rhythm. No murmurs, gallops, or rubs. 2+ symmetrical distal pulses are present in all extremities. No JVD. Respiratory: Normal respiratory effort. Lungs are clear to auscultation bilaterally. No wheezes, crackles, or rhonchi.  Gastrointestinal: Soft, non tender, and non distended with positive bowel sounds. No rebound or guarding. Genitourinary: No CVA tenderness. Musculoskeletal: Nontender with normal range of motion in all extremities. No edema, cyanosis, or erythema of extremities. Neurologic: Normal speech and language. Face is symmetric. Moving all extremities. No gross focal neurologic deficits are appreciated. Skin: Skin is warm, dry and intact. No rash noted. Psychiatric: Mood and affect are normal. Speech and behavior are normal.  ____________________________________________   LABS (all labs ordered are listed, but only abnormal results are displayed)  Labs Reviewed  CBC - Abnormal; Notable for the following:       Result Value   Platelets 148 (*)    All other components within normal limits  BASIC METABOLIC PANEL - Abnormal; Notable for the following:    Sodium 130 (*)    Glucose, Bld 109 (*)    All  other components within normal limits  TROPONIN I   ____________________________________________  EKG  ED ECG REPORT I, Rudene Re, the attending physician, personally viewed and interpreted this ECG.  Normal sinus rhythm, rate of 63, normal intervals, normal axis, no ST elevations or depressions. Unchanged from prior  ____________________________________________  RADIOLOGY  none ____________________________________________   PROCEDURES  Procedure(s) performed: None Procedures Critical Care performed:  None ____________________________________________   INITIAL IMPRESSION / ASSESSMENT AND PLAN / ED COURSE  82 y.o. female history of hypertension, peripheral vascular disease, COPD who presents for evaluation of elevated blood pressure. Patient currently undergoing changes in her medicine with Dr. Ubaldo Glassing to attempt to get BP under control. Recently started on clonidine BID, will increase dose to TID and give her a dose here. Patient is asymptomatic other than feeling flushed when BP goes up. No HA and neuro intact, no indication for head CT. Will check basic labs including troponin and get and EKG to ensure no end-organ damage from elevated BP.  Clinical Course as of Oct 23 1140  Thu Oct 23, 2016  1139 Blood work with no acute findings. BP has improved with extra dose of clonidine. Patient remains asymptomatic. Will dc home with f/u with PCP Monday.  [CV]    Clinical Course User Index [CV] Rudene Re, MD    Pertinent labs & imaging results that were available during my care of the patient were reviewed by me and considered in my medical decision making (see chart for details).    ____________________________________________   FINAL CLINICAL IMPRESSION(S) / ED DIAGNOSES  Final diagnoses:  Hypertension, unspecified type      NEW MEDICATIONS STARTED DURING THIS VISIT:  Current Discharge Medication List       Note:  This document was prepared using  Dragon voice recognition software and may include unintentional dictation errors.    Rudene Re, MD 10/23/16 [phone]

## 2016-10-23 NOTE — ED Triage Notes (Signed)
Patient ambulatory to triage with steady gait with c/o elevated blood pressure this morning. Pt reports this is her 4th visit for same symptoms. Pt reports had systolic bp [id number] this morning. Pt reports taking bp medicines today. Pt denies chest pain, shortness of breath, or headache. Pt alert and oriented x 4, no increased work in breathing noted. Skin warm and dry. Seen by linthavong as PCP, and Fath as Cardiology for hypertension. Seen by Ubaldo Glassing Tuesday for same.

## 2016-10-27 ENCOUNTER — Other Ambulatory Visit: Payer: Self-pay | Admitting: *Deleted

## 2016-10-27 ENCOUNTER — Encounter: Payer: Self-pay | Admitting: *Deleted

## 2016-10-27 DIAGNOSIS — J069 Acute upper respiratory infection, unspecified: Secondary | ICD-10-CM | POA: Diagnosis not present

## 2016-10-27 DIAGNOSIS — F411 Generalized anxiety disorder: Secondary | ICD-10-CM | POA: Diagnosis not present

## 2016-10-27 NOTE — Patient Outreach (Signed)
[facility]) Care Management  10/27/2016  CARIGAN BASSET Mar 15, 1928 [id number]   RN Health Coach telephone call to patient.  [employer] compliance verified. Per patient she doesn't want to go through the screening.  RN Health Coach explained all the services provided by [employer]. Per patient can't I just mail the information.Patient declined services at this time.  Plan: RN will Send a pamphlet on [facility] [employer] [phone]

## 2016-10-30 ENCOUNTER — Emergency Department: Payer: PPO

## 2016-10-30 ENCOUNTER — Encounter: Payer: Self-pay | Admitting: *Deleted

## 2016-10-30 ENCOUNTER — Inpatient Hospital Stay
Admission: EM | Admit: 2016-10-30 | Discharge: 2016-11-04 | DRG: 641 | Disposition: A | Discharge status: Skilled Nursing Facility | Payer: PPO | Attending: Internal Medicine | Admitting: Internal Medicine

## 2016-10-30 DIAGNOSIS — F329 Major depressive disorder, single episode, unspecified: Secondary | ICD-10-CM | POA: Diagnosis not present

## 2016-10-30 DIAGNOSIS — R41841 Cognitive communication deficit: Secondary | ICD-10-CM | POA: Diagnosis not present

## 2016-10-30 DIAGNOSIS — Z888 Allergy status to other drugs, medicaments and biological substances status: Secondary | ICD-10-CM | POA: Diagnosis not present

## 2016-10-30 DIAGNOSIS — E876 Hypokalemia: Secondary | ICD-10-CM | POA: Diagnosis not present

## 2016-10-30 DIAGNOSIS — Z7982 Long term (current) use of aspirin: Secondary | ICD-10-CM

## 2016-10-30 DIAGNOSIS — R0902 Hypoxemia: Secondary | ICD-10-CM | POA: Diagnosis present

## 2016-10-30 DIAGNOSIS — J449 Chronic obstructive pulmonary disease, unspecified: Secondary | ICD-10-CM | POA: Diagnosis present

## 2016-10-30 DIAGNOSIS — Z95820 Peripheral vascular angioplasty status with implants and grafts: Secondary | ICD-10-CM | POA: Diagnosis not present

## 2016-10-30 DIAGNOSIS — Z853 Personal history of malignant neoplasm of breast: Secondary | ICD-10-CM | POA: Diagnosis not present

## 2016-10-30 DIAGNOSIS — E222 Syndrome of inappropriate secretion of antidiuretic hormone: Secondary | ICD-10-CM | POA: Diagnosis not present

## 2016-10-30 DIAGNOSIS — Z9181 History of falling: Secondary | ICD-10-CM | POA: Diagnosis not present

## 2016-10-30 DIAGNOSIS — K219 Gastro-esophageal reflux disease without esophagitis: Secondary | ICD-10-CM | POA: Diagnosis not present

## 2016-10-30 DIAGNOSIS — I159 Secondary hypertension, unspecified: Secondary | ICD-10-CM | POA: Diagnosis not present

## 2016-10-30 DIAGNOSIS — I739 Peripheral vascular disease, unspecified: Secondary | ICD-10-CM | POA: Diagnosis not present

## 2016-10-30 DIAGNOSIS — J441 Chronic obstructive pulmonary disease with (acute) exacerbation: Secondary | ICD-10-CM | POA: Diagnosis present

## 2016-10-30 DIAGNOSIS — Z87891 Personal history of nicotine dependence: Secondary | ICD-10-CM

## 2016-10-30 DIAGNOSIS — Z79899 Other long term (current) drug therapy: Secondary | ICD-10-CM | POA: Diagnosis not present

## 2016-10-30 DIAGNOSIS — R0602 Shortness of breath: Secondary | ICD-10-CM

## 2016-10-30 DIAGNOSIS — I10 Essential (primary) hypertension: Secondary | ICD-10-CM | POA: Diagnosis present

## 2016-10-30 DIAGNOSIS — Z882 Allergy status to sulfonamides status: Secondary | ICD-10-CM

## 2016-10-30 DIAGNOSIS — R262 Difficulty in walking, not elsewhere classified: Secondary | ICD-10-CM | POA: Diagnosis not present

## 2016-10-30 DIAGNOSIS — Z7401 Bed confinement status: Secondary | ICD-10-CM | POA: Diagnosis not present

## 2016-10-30 DIAGNOSIS — Z7951 Long term (current) use of inhaled steroids: Secondary | ICD-10-CM | POA: Diagnosis not present

## 2016-10-30 DIAGNOSIS — Z9010 Acquired absence of unspecified breast and nipple: Secondary | ICD-10-CM | POA: Diagnosis not present

## 2016-10-30 DIAGNOSIS — E871 Hypo-osmolality and hyponatremia: Secondary | ICD-10-CM | POA: Diagnosis not present

## 2016-10-30 DIAGNOSIS — J90 Pleural effusion, not elsewhere classified: Secondary | ICD-10-CM | POA: Diagnosis not present

## 2016-10-30 DIAGNOSIS — I509 Heart failure, unspecified: Secondary | ICD-10-CM | POA: Diagnosis not present

## 2016-10-30 DIAGNOSIS — I517 Cardiomegaly: Secondary | ICD-10-CM | POA: Diagnosis not present

## 2016-10-30 DIAGNOSIS — R0600 Dyspnea, unspecified: Secondary | ICD-10-CM | POA: Diagnosis not present

## 2016-10-30 DIAGNOSIS — Z452 Encounter for adjustment and management of vascular access device: Secondary | ICD-10-CM | POA: Diagnosis not present

## 2016-10-30 DIAGNOSIS — M810 Age-related osteoporosis without current pathological fracture: Secondary | ICD-10-CM | POA: Diagnosis not present

## 2016-10-30 DIAGNOSIS — Z9012 Acquired absence of left breast and nipple: Secondary | ICD-10-CM | POA: Diagnosis not present

## 2016-10-30 DIAGNOSIS — Z79810 Long term (current) use of selective estrogen receptor modulators (SERMs): Secondary | ICD-10-CM | POA: Diagnosis not present

## 2016-10-30 DIAGNOSIS — M6281 Muscle weakness (generalized): Secondary | ICD-10-CM | POA: Diagnosis not present

## 2016-10-30 LAB — CBC
HEMATOCRIT: 39.6 % (ref 35.0–47.0)
Hemoglobin: 13.6 g/dL (ref 12.0–16.0)
MCH: 30.6 pg (ref 26.0–34.0)
MCHC: 34.4 g/dL (ref 32.0–36.0)
MCV: 88.8 fL (ref 80.0–100.0)
Platelets: 127 10*3/uL — ABNORMAL LOW (ref 150–440)
RBC: 4.46 MIL/uL (ref 3.80–5.20)
RDW: 13.4 % (ref 11.5–14.5)
WBC: 4 10*3/uL (ref 3.6–11.0)

## 2016-10-30 LAB — BASIC METABOLIC PANEL
ANION GAP: 7 (ref 5–15)
Anion gap: 10 (ref 5–15)
BUN: 9 mg/dL (ref 6–20)
BUN: 7 mg/dL (ref 6–20)
CHLORIDE: 86 mmol/L — AB (ref 101–111)
CO2: 22 mmol/L (ref 22–32)
CO2: 26 mmol/L (ref 22–32)
Calcium: 8.4 mg/dL — ABNORMAL LOW (ref 8.9–10.3)
Calcium: 8.1 mg/dL — ABNORMAL LOW (ref 8.9–10.3)
Chloride: 88 mmol/L — ABNORMAL LOW (ref 101–111)
Creatinine, Ser: 0.48 mg/dL (ref 0.44–1.00)
Creatinine, Ser: 0.49 mg/dL (ref 0.44–1.00)
GFR calc Af Amer: >60 mL/min (ref >60)
GFR calc non Af Amer: >60 mL/min (ref >60)
GFR calc non Af Amer: >60 mL/min (ref >60)
GLUCOSE: 118 mg/dL — AB (ref 65–99)
Glucose, Bld: 128 mg/dL — ABNORMAL HIGH (ref 65–99)
POTASSIUM: 3.9 mmol/L (ref 3.5–5.1)
POTASSIUM: 3.4 mmol/L — AB (ref 3.5–5.1)
SODIUM: 118 mmol/L — AB (ref 135–145)
Sodium: 121 mmol/L — ABNORMAL LOW (ref 135–145)

## 2016-10-30 MED ORDER — SODIUM CHLORIDE 0.9 % IV SOLN
INTRAVENOUS | Status: AC
Start: 2016-10-30 — End: 2016-10-31
  Administered 2016-10-30 – 2016-10-31 (×2): via INTRAVENOUS

## 2016-10-30 NOTE — ED Notes (Signed)
Called lab to verify that they were running BMP - they stated they had not received the sample or that it had been misplaced and that we would need to redraw - Dr Archie Balboa notified and sample to be recollected

## 2016-10-30 NOTE — ED Triage Notes (Signed)
Pt arrives with complaints of SOB, states hx of cOPD and asthma, states she went to [facility] and was sent to ED for 02 sats in the 80s and HTN, upon arrival pt o2 sat 93% on RA, denies any chest pain

## 2016-10-30 NOTE — ED Provider Notes (Addendum)
[facility] Emergency Department Provider Note    ____________________________________________   I have reviewed the triage vital signs and the nursing notes.   HISTORY  Chief Complaint Shortness of Breath   History limited by: Not Limited   HPI Melanie Huffman is a 82 y.o. female who presents to the emergency department todaybecause of concerns for some shortness of breath as well as hypertension. Patient states that she has been having problems with her hypertension. She has been to the emergency department multiple times as well as primary care doctor's office for this. She has had some medication changes. Associated with a high blood pressure are symptoms of shortness of breath. This has been going on for a few days. She does state that she was given inhaler by primary care doctor 2 days ago but that has been of no help. No chest pain. Patient denies any fevers.   Past Medical History:  Diagnosis Date  . Asthma   . Breast cancer ([place]) 1991   mastectomy left  . Cancer ([place]) 1991   L Breast  . COPD (chronic obstructive pulmonary disease) ([place])   . Heart murmur   . Hypertension   . Peripheral vascular disease ([place])   . Shortness of breath dyspnea   . Traumatic subdural hematoma [facility])     Patient Active Problem List   Diagnosis Date Noted  . Hyponatremia 07/10/2016  . Dehydration 07/10/2016  . Acute renal insufficiency 07/10/2016  . Hypokalemia 07/10/2016  . Cellulitis of leg, left 07/09/2016  . Patellar fracture 04/11/2015  . Subdural hematoma ([place]) 07/26/2014    Past Surgical History:  Procedure Laterality Date  . ABDOMINAL HYSTERECTOMY     Partial  . APPENDECTOMY    . BREAST BIOPSY Right    1980 and 1970's  . BREAST SURGERY  1991   L Mastectomy  . MASTECTOMY    . ORIF PATELLA Left 04/12/2015   Procedure: OPEN REDUCTION INTERNAL (ORIF) FIXATION PATELLA;  Surgeon: Hessie Knows, MD;  Location: [facility] ORS;  Service: Orthopedics;   Laterality: Left;  . PERIPHERAL VASCULAR CATHETERIZATION Left 04/17/2015   Procedure: Lower Extremity Angiography;  Surgeon: Katha Cabal, MD;  Location: [place] CV LAB;  Service: Cardiovascular;  Laterality: Left;  . PERIPHERAL VASCULAR CATHETERIZATION  04/17/2015   Procedure: Lower Extremity Intervention;  Surgeon: Katha Cabal, MD;  Location: [place] [facility];  Service: Cardiovascular;;  . TONSILLECTOMY      Prior to Admission medications   Medication Sig Start Date End Date Taking? Authorizing Provider  acetaminophen (TYLENOL) 325 MG tablet Take 2 tablets (650 mg total) by mouth every 6 (six) hours as needed for mild pain (or Fever >/= 101). 04/16/15  Yes Reche Dixon, PA-C  Ascorbic Acid (VITAMIN C PO) Take 1 capsule by mouth daily.   Yes Historical Provider, MD  aspirin EC 81 MG tablet Take 81 mg by mouth daily.   Yes Historical Provider, MD  Calcium Carbonate-Vitamin D (CALCIUM + D PO) Take 1 tablet by mouth 2 (two) times daily.   Yes Historical Provider, MD  carvedilol (COREG) 25 MG tablet Take 25 mg by mouth 2 (two) times daily with a meal.   Yes Historical Provider, MD  cloNIDine (CATAPRES) 0.1 MG tablet Take 1 tablet (0.1 mg total) by mouth 3 (three) times daily. 10/23/16 10/23/17 Yes Rudene Re, MD  Fluticasone-Salmeterol (ADVAIR DISKUS) 250-50 MCG/DOSE AEPB Inhale 1 puff into the lungs 2 (two) times daily. 06/12/16  Yes Historical Provider, MD  hydrochlorothiazide (HYDRODIURIL) 25 MG tablet Take 25 mg by mouth daily.   Yes Historical Provider, MD  Multiple Vitamins-Minerals (CENTRUM SILVER PO) Take 1 tablet by mouth daily.   Yes Historical Provider, MD  omeprazole (PRILOSEC) 40 MG capsule Take 40 mg by mouth daily.    Yes Historical Provider, MD  potassium chloride SA (K-DUR,KLOR-CON) 20 MEQ tablet Take 20 mEq by mouth 2 (two) times daily.    Yes Historical Provider, MD  raloxifene (EVISTA) 60 MG tablet Take 60 mg by mouth daily.   Yes Historical Provider, MD   ramipril (ALTACE) 10 MG capsule Take 20 mg by mouth daily.    Yes Historical Provider, MD  tiotropium (SPIRIVA) 18 MCG inhalation capsule Place 18 mcg into inhaler and inhale daily.    Yes Historical Provider, MD  clindamycin (CLEOCIN) 300 MG capsule Take 1 capsule (300 mg total) by mouth 3 (three) times daily. Patient not taking: Reported on 10/30/2016 07/10/16   Theodoro Grist, MD  theophylline (THEO-24) 100 MG 24 hr capsule Take 100 mg by mouth 2 (two) times daily.    Historical Provider, MD    Allergies Amlodipine; Codeine; and Sulfa antibiotics  Family History  Problem Relation Age of Onset  . CVA Mother   . Kidney disease Mother   . Heart disease Father   . Kidney disease Cousin 60  . Kidney disease Cousin 67  . Breast cancer Neg Hx     Social History Social History  Substance Use Topics  . Smoking status: Former Smoker    Packs/day: 0.50    Years: 20.00    Types: Cigarettes    Quit date: 01/26/1979  . Smokeless tobacco: Never Used  . Alcohol use No    Review of Systems  Constitutional: Negative for fever. Cardiovascular: Negative for chest pain. Respiratory: Positive for shortness of breath. Gastrointestinal: Negative for abdominal pain, vomiting and diarrhea. Neurological: Negative for headaches, focal weakness or numbness.  10-point ROS otherwise negative.  ____________________________________________   PHYSICAL EXAM:  VITAL SIGNS: ED Triage Vitals  Enc Vitals Group     BP 10/30/16 1628 (!) 193/105     Pulse Rate 10/30/16 1628 70     Resp 10/30/16 1628 18     Temp 10/30/16 1628 97.8 F (36.6 C)     Temp Source 10/30/16 1628 Oral     SpO2 10/30/16 1628 95 %     Weight 10/30/16 1628 117 lb (53.1 kg)     Height 10/30/16 1628 5\' 4"  (1.626 m)     Head Circumference --      Peak Flow --      Pain Score 10/30/16 1629 0   Constitutional: Alert and oriented. Well appearing and in no distress. Eyes: Conjunctivae are normal. Normal extraocular  movements. ENT   Head: Normocephalic and atraumatic.   Nose: No congestion/rhinnorhea.   Mouth/Throat: Mucous membranes are moist.   Neck: No stridor. Hematological/Lymphatic/Immunilogical: No cervical lymphadenopathy. Cardiovascular: Normal rate, regular rhythm.  No murmurs, rubs, or gallops.  Respiratory: Normal respiratory effort without tachypnea nor retractions. Breath sounds are clear and equal bilaterally. No wheezes/rales/rhonchi. Gastrointestinal: Soft and nontender. No distention.  Genitourinary: Deferred Musculoskeletal: Normal range of motion in all extremities. No lower extremity edema. Neurologic:  Normal speech and language. No gross focal neurologic deficits are appreciated.  Skin:  Skin is warm, dry and intact. No rash noted. Psychiatric: Mood and affect are normal. Speech and behavior are normal. Patient exhibits appropriate insight and judgment.  ____________________________________________  LABS (pertinent positives/negatives)  Labs Reviewed  BASIC METABOLIC PANEL - Abnormal; Notable for the following:       Result Value   Sodium 118 (*)    Chloride 86 (*)    Glucose, Bld 128 (*)    Calcium 8.4 (*)    All other components within normal limits  CBC - Abnormal; Notable for the following:    Platelets 127 (*)    All other components within normal limits  BASIC METABOLIC PANEL - Abnormal; Notable for the following:    Sodium 121 (*)    Potassium 3.4 (*)    Chloride 88 (*)    Glucose, Bld 118 (*)    Calcium 8.1 (*)    All other components within normal limits     ____________________________________________   EKG  I, Nance Pear, attending physician, personally viewed and interpreted this EKG  EKG Time: 1634 Rate: 65 Rhythm: normal sinus rhythm Axis: left axis deviation Intervals: qtc 418 QRS: narrow, q waves V1 ST changes: no st elevation Impression: abnormal ekg   ____________________________________________     RADIOLOGY  CXR  IMPRESSION: 1. No acute cardiopulmonary disease. Stable cardiomegaly.  2. Left mastectomy .   ____________________________________________   PROCEDURES  Procedures  ____________________________________________   INITIAL IMPRESSION / ASSESSMENT AND PLAN / ED COURSE  Pertinent labs & imaging results that were available during my care of the patient were reviewed by me and considered in my medical decision making (see chart for details).  Patient presented to the emergency department today because of concerns for some shortness breath and hypertension. She has been having issues with her blood pressure perfused days. Blood work here did show hyponatremia. This was repeated and it was verified. Given this hypernatremia given that it does appear somewhat acute will plan on admitting patient to hospital service. I do wonder if some of the medication changes could contribute to the patient's new hyponatremia. ____________________________________________   FINAL CLINICAL IMPRESSION(S) / ED DIAGNOSES  Final diagnoses:  Hyponatremia  Secondary hypertension     Note: This dictation was prepared with Dragon dictation. Any transcriptional errors that result from this process are unintentional    Nance Pear, MD 10/30/16 [place], MD 10/30/16 [phone]

## 2016-10-30 NOTE — ED Notes (Signed)
From [facility] , [place] sats in the 80's , upto 93% at rest , HTN 210/110, hx of COPD

## 2016-10-30 NOTE — ED Notes (Addendum)
Pt reports shortness of breath since yesterday and a headache - pt states she used he inhaler 45 minutes ago and now the shortness of breath is better - pt denies any other symptoms - urgent care sent pt to the ed for elevated BP (pt has history of HTN)

## 2016-10-31 DIAGNOSIS — J449 Chronic obstructive pulmonary disease, unspecified: Secondary | ICD-10-CM | POA: Diagnosis present

## 2016-10-31 DIAGNOSIS — K219 Gastro-esophageal reflux disease without esophagitis: Secondary | ICD-10-CM | POA: Diagnosis not present

## 2016-10-31 DIAGNOSIS — E871 Hypo-osmolality and hyponatremia: Secondary | ICD-10-CM | POA: Diagnosis not present

## 2016-10-31 DIAGNOSIS — I10 Essential (primary) hypertension: Secondary | ICD-10-CM | POA: Diagnosis present

## 2016-10-31 DIAGNOSIS — I739 Peripheral vascular disease, unspecified: Secondary | ICD-10-CM | POA: Diagnosis present

## 2016-10-31 LAB — BASIC METABOLIC PANEL
ANION GAP: 10 (ref 5–15)
BUN: 5 mg/dL — ABNORMAL LOW (ref 6–20)
CHLORIDE: 89 mmol/L — AB (ref 101–111)
CO2: 25 mmol/L (ref 22–32)
Calcium: 8.4 mg/dL — ABNORMAL LOW (ref 8.9–10.3)
Creatinine, Ser: 0.47 mg/dL (ref 0.44–1.00)
Glucose, Bld: 124 mg/dL — ABNORMAL HIGH (ref 65–99)
POTASSIUM: 2.9 mmol/L — AB (ref 3.5–5.1)
SODIUM: 124 mmol/L — AB (ref 135–145)

## 2016-10-31 LAB — CBC
HEMATOCRIT: 41.5 % (ref 35.0–47.0)
Hemoglobin: 14.4 g/dL (ref 12.0–16.0)
MCH: 30.5 pg (ref 26.0–34.0)
MCHC: 34.7 g/dL (ref 32.0–36.0)
MCV: 88 fL (ref 80.0–100.0)
Platelets: 129 10*3/uL — ABNORMAL LOW (ref 150–440)
RBC: 4.71 MIL/uL (ref 3.80–5.20)
RDW: 13.4 % (ref 11.5–14.5)
WBC: 6.2 10*3/uL (ref 3.6–11.0)

## 2016-10-31 LAB — URINALYSIS COMPLETE WITH MICROSCOPIC (ARMC ONLY)
BACTERIA UA: NONE SEEN
Bilirubin Urine: NEGATIVE
GLUCOSE, UA: 50 mg/dL — AB
Hgb urine dipstick: NEGATIVE
Leukocytes, UA: NEGATIVE
Nitrite: NEGATIVE
PROTEIN: NEGATIVE mg/dL
SPECIFIC GRAVITY, URINE: 1.002 — AB (ref 1.005–1.030)
pH: 8 (ref 5.0–8.0)

## 2016-10-31 LAB — MAGNESIUM: Magnesium: 1.5 mg/dL — ABNORMAL LOW (ref 1.7–2.4)

## 2016-10-31 MED ORDER — RAMIPRIL 10 MG PO CAPS
20.0000 mg | ORAL_CAPSULE | Freq: Every day | ORAL | Status: DC
  Administered 2016-10-31 – 2016-11-04 (×5): 20 mg via ORAL
  Filled 2016-10-31 (×5): qty 2

## 2016-10-31 MED ORDER — ENOXAPARIN SODIUM 40 MG/0.4ML ~~LOC~~ SOLN
40.0000 mg | SUBCUTANEOUS | Status: DC
  Administered 2016-10-31 – 2016-11-03 (×4): 40 mg via SUBCUTANEOUS
  Filled 2016-10-31 (×4): qty 0.4

## 2016-10-31 MED ORDER — RALOXIFENE HCL 60 MG PO TABS
60.0000 mg | ORAL_TABLET | Freq: Every day | ORAL | Status: DC
  Administered 2016-10-31 – 2016-11-04 (×5): 60 mg via ORAL
  Filled 2016-10-31 (×5): qty 1

## 2016-10-31 MED ORDER — CLONIDINE HCL 0.1 MG PO TABS
0.1000 mg | ORAL_TABLET | Freq: Three times a day (TID) | ORAL | Status: DC
  Administered 2016-10-31 – 2016-11-04 (×12): 0.1 mg via ORAL
  Filled 2016-10-31 (×12): qty 1

## 2016-10-31 MED ORDER — ASPIRIN EC 81 MG PO TBEC
81.0000 mg | DELAYED_RELEASE_TABLET | Freq: Every day | ORAL | Status: DC
  Administered 2016-10-31 – 2016-11-04 (×5): 81 mg via ORAL
  Filled 2016-10-31 (×5): qty 1

## 2016-10-31 MED ORDER — ZOLPIDEM TARTRATE 5 MG PO TABS
5.0000 mg | ORAL_TABLET | Freq: Every evening | ORAL | Status: DC | PRN
  Administered 2016-10-31 – 2016-11-03 (×4): 5 mg via ORAL
  Filled 2016-10-31 (×4): qty 1

## 2016-10-31 MED ORDER — SODIUM CHLORIDE 0.9 % IV SOLN
INTRAVENOUS | Status: DC
  Administered 2016-10-31 – 2016-11-01 (×3): via INTRAVENOUS

## 2016-10-31 MED ORDER — ACETAMINOPHEN 650 MG RE SUPP: 650.0000 mg | Freq: Four times a day (QID) | RECTAL | Status: DC | PRN

## 2016-10-31 MED ORDER — ACETAMINOPHEN 325 MG PO TABS
650.0000 mg | ORAL_TABLET | Freq: Four times a day (QID) | ORAL | Status: DC | PRN
Start: 2016-10-31 — End: 2016-11-04

## 2016-10-31 MED ORDER — LORATADINE 10 MG PO TABS
10.0000 mg | ORAL_TABLET | Freq: Every day | ORAL | Status: DC
  Administered 2016-10-31 – 2016-11-04 (×4): 10 mg via ORAL
  Filled 2016-10-31 (×6): qty 1

## 2016-10-31 MED ORDER — CARVEDILOL 25 MG PO TABS
25.0000 mg | ORAL_TABLET | Freq: Two times a day (BID) | ORAL | Status: DC
  Administered 2016-10-31 – 2016-11-04 (×7): 25 mg via ORAL
  Filled 2016-10-31 (×8): qty 1

## 2016-10-31 MED ORDER — LABETALOL HCL 5 MG/ML IV SOLN
10.0000 mg | INTRAVENOUS | Status: DC | PRN
  Filled 2016-10-31: qty 4

## 2016-10-31 MED ORDER — DEXTROMETHORPHAN POLISTIREX ER 30 MG/5ML PO SUER
30.0000 mg | Freq: Two times a day (BID) | ORAL | Status: DC
  Administered 2016-10-31 – 2016-11-04 (×9): 30 mg via ORAL
  Filled 2016-10-31 (×10): qty 5

## 2016-10-31 MED ORDER — PANTOPRAZOLE SODIUM 40 MG PO TBEC
40.0000 mg | DELAYED_RELEASE_TABLET | Freq: Every day | ORAL | Status: DC
  Administered 2016-10-31 – 2016-11-04 (×5): 40 mg via ORAL
  Filled 2016-10-31 (×5): qty 1

## 2016-10-31 MED ORDER — PAROXETINE HCL 20 MG PO TABS
20.0000 mg | ORAL_TABLET | Freq: Every day | ORAL | Status: DC
  Administered 2016-10-31 – 2016-11-03 (×4): 20 mg via ORAL
  Filled 2016-10-31 (×4): qty 1

## 2016-10-31 MED ORDER — THEOPHYLLINE ER 100 MG PO CP24
100.0000 mg | ORAL_CAPSULE | Freq: Two times a day (BID) | ORAL | Status: DC
  Administered 2016-11-03 (×2): 100 mg via ORAL
  Filled 2016-10-31 (×11): qty 1

## 2016-10-31 MED ORDER — TIOTROPIUM BROMIDE MONOHYDRATE 18 MCG IN CAPS
18.0000 ug | ORAL_CAPSULE | Freq: Every day | RESPIRATORY_TRACT | Status: DC
  Administered 2016-10-31 – 2016-11-04 (×5): 18 ug via RESPIRATORY_TRACT
  Filled 2016-10-31: qty 5

## 2016-10-31 MED ORDER — MAGNESIUM SULFATE 4 GM/100ML IV SOLN
4.0000 g | Freq: Once | INTRAVENOUS | Status: AC
  Administered 2016-10-31: 4 g via INTRAVENOUS
  Filled 2016-10-31: qty 100

## 2016-10-31 MED ORDER — DM-GUAIFENESIN ER 30-600 MG PO TB12: 1.0000 | ORAL_TABLET | Freq: Two times a day (BID) | ORAL | Status: DC

## 2016-10-31 MED ORDER — POTASSIUM CHLORIDE CRYS ER 20 MEQ PO TBCR
20.0000 meq | EXTENDED_RELEASE_TABLET | Freq: Two times a day (BID) | ORAL | Status: DC
  Administered 2016-10-31 – 2016-11-04 (×8): 20 meq via ORAL
  Filled 2016-10-31 (×8): qty 1

## 2016-10-31 MED ORDER — SODIUM CHLORIDE 0.9% FLUSH
3.0000 mL | Freq: Two times a day (BID) | INTRAVENOUS | Status: DC
  Administered 2016-10-31 – 2016-11-04 (×9): 3 mL via INTRAVENOUS

## 2016-10-31 MED ORDER — ONDANSETRON HCL 4 MG/2ML IJ SOLN
4.0000 mg | Freq: Four times a day (QID) | INTRAMUSCULAR | Status: DC | PRN
Start: 2016-10-31 — End: 2016-11-04

## 2016-10-31 MED ORDER — POTASSIUM CHLORIDE CRYS ER 20 MEQ PO TBCR
40.0000 meq | EXTENDED_RELEASE_TABLET | Freq: Once | ORAL | Status: AC
  Administered 2016-10-31: 40 meq via ORAL
  Filled 2016-10-31: qty 2

## 2016-10-31 MED ORDER — MOMETASONE FURO-FORMOTEROL FUM 200-5 MCG/ACT IN AERO
2.0000 | INHALATION_SPRAY | Freq: Two times a day (BID) | RESPIRATORY_TRACT | Status: DC
Start: 2016-10-31 — End: 2016-11-04
  Administered 2016-10-31 – 2016-11-04 (×9): 2 via RESPIRATORY_TRACT
  Filled 2016-10-31: qty 8.8

## 2016-10-31 MED ORDER — GUAIFENESIN ER 600 MG PO TB12
600.0000 mg | ORAL_TABLET | Freq: Two times a day (BID) | ORAL | Status: DC
  Administered 2016-10-31 – 2016-11-04 (×7): 600 mg via ORAL
  Filled 2016-10-31 (×8): qty 1

## 2016-10-31 MED ORDER — ONDANSETRON HCL 4 MG PO TABS: 4.0000 mg | ORAL_TABLET | Freq: Four times a day (QID) | ORAL | Status: DC | PRN

## 2016-10-31 NOTE — Care Management (Signed)
Patient placed in observation with shortness of breath and hyponatremia.  Prior to this presentation she lived at home alone; Independent in all adls, denies issues accessing medical care, obtaining medications or with transportation. [facility].  Patient was placed on 02 earlier in the day when she had a spell of weakness.  Discussed with primary nurse the need to wean/ remove and or assess for need of 02 at discharge.  Current with her PCP.  Her daughter will transport her home at discharge.

## 2016-10-31 NOTE — Progress Notes (Signed)
O2 sats on [place] 2 L = 96%. On Room air = 94 % After walking to the bathroom and back. = 84%

## 2016-10-31 NOTE — H&P (Signed)
[facility] NAME: Marcayla Burkey    MR#:  [id number]  DATE OF BIRTH:  1928/01/17  DATE OF ADMISSION:  10/30/2016  PRIMARY CARE PHYSICIAN: Dion Body, MD   REQUESTING/REFERRING PHYSICIAN: Archie Balboa, MD  CHIEF COMPLAINT:   Chief Complaint  Patient presents with  . Shortness of Breath    HISTORY OF PRESENT ILLNESS:  Melanie Huffman  is a 82 y.o. female who presents with An episode of acute shortness of breath. Patient has had significantly increased blood pressure in the recent past which has proven difficult to control. She comes in today after being seen by her primary physician for shortness of breath. Here she was found to be significantly hyponatremic. Her sodium was 118 and 121 on recheck, whereas a week ago in our system it was within normal limits or close to it. Patient states she recently had her hydrochlorothiazide dose increased, and she is also been making significant effort to avoid all salt in her diet.  PAST MEDICAL HISTORY:   Past Medical History:  Diagnosis Date  . Asthma   . Breast cancer ([place]) 1991   mastectomy left  . Cancer ([place]) 1991   L Breast  . COPD (chronic obstructive pulmonary disease) ([place])   . Heart murmur   . Hypertension   . Peripheral vascular disease ([place])   . Shortness of breath dyspnea   . Traumatic subdural hematoma ([facility])     PAST SURGICAL HISTORY:   Past Surgical History:  Procedure Laterality Date  . ABDOMINAL HYSTERECTOMY     Partial  . APPENDECTOMY    . BREAST BIOPSY Right    1980 and 1970's  . BREAST SURGERY  1991   L Mastectomy  . MASTECTOMY    . ORIF PATELLA Left 04/12/2015   Procedure: OPEN REDUCTION INTERNAL (ORIF) FIXATION PATELLA;  Surgeon: Hessie Knows, MD;  Location: [facility];  Service: Orthopedics;  Laterality: Left;  . PERIPHERAL VASCULAR CATHETERIZATION Left 04/17/2015   Procedure: Lower Extremity Angiography;  Surgeon: Katha Cabal, MD;  Location:  [facility];  Service: Cardiovascular;  Laterality: Left;  . PERIPHERAL VASCULAR CATHETERIZATION  04/17/2015   Procedure: Lower Extremity Intervention;  Surgeon: Katha Cabal, MD;  Location: [facility];  Service: Cardiovascular;;  . TONSILLECTOMY      SOCIAL HISTORY:   Social History  Substance Use Topics  . Smoking status: Former Smoker    Packs/day: 0.50    Years: 20.00    Types: Cigarettes    Quit date: 01/26/1979  . Smokeless tobacco: Never Used  . Alcohol use No    FAMILY HISTORY:   Family History  Problem Relation Age of Onset  . CVA Mother   . Kidney disease Mother   . Heart disease Father   . Kidney disease Cousin 60  . Kidney disease Cousin 72  . Breast cancer Neg Hx     DRUG ALLERGIES:   Allergies  Allergen Reactions  . Amlodipine Swelling     [Onset: 08/26/2013]  . Codeine Nausea Only  . Sulfa Antibiotics Rash and Nausea Only    MEDICATIONS AT HOME:   Prior to Admission medications   Medication Sig Start Date End Date Taking? Authorizing Provider  acetaminophen (TYLENOL) 325 MG tablet Take 2 tablets (650 mg total) by mouth every 6 (six) hours as needed for mild pain (or Fever >/= 101). 04/16/15  Yes Reche Dixon, PA-C  Ascorbic Acid (VITAMIN C PO) Take 1  capsule by mouth daily.   Yes Historical Provider, MD  aspirin EC 81 MG tablet Take 81 mg by mouth daily.   Yes Historical Provider, MD  Calcium Carbonate-Vitamin D (CALCIUM + D PO) Take 1 tablet by mouth 2 (two) times daily.   Yes Historical Provider, MD  carvedilol (COREG) 25 MG tablet Take 25 mg by mouth 2 (two) times daily with a meal.   Yes Historical Provider, MD  cloNIDine (CATAPRES) 0.1 MG tablet Take 1 tablet (0.1 mg total) by mouth 3 (three) times daily. 10/23/16 10/23/17 Yes Rudene Re, MD  Fluticasone-Salmeterol (ADVAIR DISKUS) 250-50 MCG/DOSE AEPB Inhale 1 puff into the lungs 2 (two) times daily. 06/12/16  Yes Historical Provider, MD  hydrochlorothiazide (HYDRODIURIL)  25 MG tablet Take 25 mg by mouth daily.   Yes Historical Provider, MD  Multiple Vitamins-Minerals (CENTRUM SILVER PO) Take 1 tablet by mouth daily.   Yes Historical Provider, MD  omeprazole (PRILOSEC) 40 MG capsule Take 40 mg by mouth daily.    Yes Historical Provider, MD  potassium chloride SA (K-DUR,KLOR-CON) 20 MEQ tablet Take 20 mEq by mouth 2 (two) times daily.    Yes Historical Provider, MD  raloxifene (EVISTA) 60 MG tablet Take 60 mg by mouth daily.   Yes Historical Provider, MD  ramipril (ALTACE) 10 MG capsule Take 20 mg by mouth daily.    Yes Historical Provider, MD  tiotropium (SPIRIVA) 18 MCG inhalation capsule Place 18 mcg into inhaler and inhale daily.    Yes Historical Provider, MD  clindamycin (CLEOCIN) 300 MG capsule Take 1 capsule (300 mg total) by mouth 3 (three) times daily. Patient not taking: Reported on 10/30/2016 07/10/16   Theodoro Grist, MD  theophylline (THEO-24) 100 MG 24 hr capsule Take 100 mg by mouth 2 (two) times daily.    Historical Provider, MD    REVIEW OF SYSTEMS:  Review of Systems  Constitutional: Negative for chills, fever, malaise/fatigue and weight loss.  HENT: Negative for ear pain, hearing loss and tinnitus.   Eyes: Negative for blurred vision, double vision, pain and redness.  Respiratory: Positive for shortness of breath. Negative for cough and hemoptysis.   Cardiovascular: Negative for chest pain, palpitations, orthopnea and leg swelling.  Gastrointestinal: Negative for abdominal pain, constipation, diarrhea, nausea and vomiting.  Genitourinary: Negative for dysuria, frequency and hematuria.  Musculoskeletal: Negative for back pain, joint pain and neck pain.  Skin:       No acne, rash, or lesions  Neurological: Positive for weakness. Negative for dizziness, tremors and focal weakness.  Endo/Heme/Allergies: Negative for polydipsia. Does not bruise/bleed easily.  Psychiatric/Behavioral: Negative for depression. The patient is not nervous/anxious and  does not have insomnia.      VITAL SIGNS:   Vitals:   10/30/16 2010 10/30/16 2030 10/30/16 2130 10/30/16 2330  BP: (!) 160/97 (!) 170/106 (!) 159/92 (!) 178/93  Pulse: 71 66 65 73  Resp: 16     Temp:      TempSrc:      SpO2: 94% 97% 95% 94%  Weight:      Height:       Wt Readings from Last 3 Encounters:  10/30/16 53.1 kg (117 lb)  10/23/16 53.1 kg (117 lb)  10/20/16 53.1 kg (117 lb)    PHYSICAL EXAMINATION:  Physical Exam  Vitals reviewed. Constitutional: She is oriented to person, place, and time. She appears well-developed and well-nourished. No distress.  HENT:  Head: Normocephalic and atraumatic.  Mouth/Throat: Oropharynx is clear and moist.  Eyes: Conjunctivae and EOM are normal. Pupils are equal, round, and reactive to light. No scleral icterus.  Neck: Normal range of motion. Neck supple. No JVD present. No thyromegaly present.  Cardiovascular: Normal rate, regular rhythm and intact distal pulses.  Exam reveals no gallop and no friction rub.   No murmur heard. Respiratory: Effort normal and breath sounds normal. No respiratory distress. She has no wheezes. She has no rales.  GI: Soft. Bowel sounds are normal. She exhibits no distension. There is no tenderness.  Musculoskeletal: Normal range of motion. She exhibits no edema.  No arthritis, no gout  Lymphadenopathy:    She has no cervical adenopathy.  Neurological: She is alert and oriented to person, place, and time. No cranial nerve deficit.  No dysarthria, no aphasia  Skin: Skin is warm and dry. No rash noted. No erythema.  Psychiatric: She has a normal mood and affect. Her behavior is normal. Judgment and thought content normal.    LABORATORY PANEL:   CBC  Recent Labs Lab 10/30/16 1631  WBC 4.0  HGB 13.6  HCT 39.6  PLT 127*   ------------------------------------------------------------------------------------------------------------------  Chemistries   Recent Labs Lab 10/30/16 2148  NA 121*  K  3.4*  CL 88*  CO2 26  GLUCOSE 118*  BUN 7  CREATININE 0.49  CALCIUM 8.1*   ------------------------------------------------------------------------------------------------------------------  Cardiac Enzymes No results for input(s): TROPONINI in the last 168 hours. ------------------------------------------------------------------------------------------------------------------  RADIOLOGY:  Dg Chest 2 View  Result Date: 10/30/2016 CLINICAL DATA:  Hypertension. EXAM: CHEST  2 VIEW COMPARISON:  10/13/2016. FINDINGS: Mediastinum and hilar structures normal. Cardiomegaly with normal pulmonary vascularity. No focal infiltrate noted. No pleural effusion or pneumothorax. Left mastectomy. No acute or focal bony abnormality. Degenerative changes thoracic spine. IMPRESSION: 1.  No acute cardiopulmonary disease.  Stable cardiomegaly. 2. Left mastectomy . Electronically Signed   By: Marcello Moores  Register   On: 10/30/2016 17:43    EKG:   Orders placed or performed during the hospital encounter of 10/30/16  . ED EKG  . ED EKG  . EKG 12-Lead  . EKG 12-Lead    IMPRESSION AND PLAN:  Principal Problem:   Hyponatremia - Strong suspicion for hyponatremia being due to thiazide use and recent increase in her dosage. UA is pending, this will help [place] determine her fluid balance status. We have started her on normal saline initially and we will hold her hydrochlorothiazide for now. If her sodium does not start to improve then we will pursue further workup. Active Problems:   Uncontrolled hypertension - continue home meds. When necessary antihypertensives in addition home meds for blood pressure goal less than 160/100. Might consider adjusting patient's home med dosage all she is here in order to observe more consistent blood pressure control.   COPD (chronic obstructive pulmonary disease) ([place]) - continue home meds and inhalers   GERD (gastroesophageal reflux disease) - home dose PPI  All the records are  reviewed and case discussed with ED provider. Management plans discussed with the patient and/or family.  DVT PROPHYLAXIS: SubQ lovenox  GI PROPHYLAXIS: PPI  ADMISSION STATUS: Observation  CODE STATUS: Full Code Status History    Date Active Date Inactive Code Status Order ID Comments User Context   07/09/2016  3:58 PM 07/10/2016  7:14 PM Full Code [id number]  Henreitta Leber, MD Inpatient   04/12/2015  7:49 PM 04/16/2015  6:18 PM Full Code [id number]  Hessie Knows, MD Inpatient   04/11/2015 12:52 PM 04/12/2015  7:49 PM Full Code [id number]  Hessie Knows, MD Inpatient   07/26/2014  4:45 AM 07/26/2014  7:34 PM Full Code [id number]  Ashok Pall, MD Inpatient    Advance Directive Documentation   Flowsheet Row Most Recent Value  Type of Advance Directive  Healthcare Power of Attorney, Living will  Pre-existing out of facility DNR order (yellow form or pink MOST form)  No data  "MOST" Form in Place?  No data      TOTAL TIME TAKING CARE OF THIS PATIENT: 40 minutes.    Dimas Scheck [place] 10/31/2016, 12:25 AM  [facility]  Office  [phone]  CC: Primary care physician; Dion Body, MD

## 2016-10-31 NOTE — Progress Notes (Signed)
Says she feels better on the oxygen.

## 2016-10-31 NOTE — ED Notes (Signed)
Pt called out to request something to drink. MD Beather Arbour approved PO fluids. Pt brought water.

## 2016-10-31 NOTE — Progress Notes (Signed)
[facility] NAME: Melanie Huffman    MR#:  [id number]  DATE OF BIRTH:  [birth year]  SUBJECTIVE:  CHIEF COMPLAINT:   Chief Complaint  Patient presents with  . Shortness of Breath  Feeling weak and tired as she has not slept all night, daughter at bedside.  Worried about her mother and her medication management REVIEW OF SYSTEMS:  Review of Systems  Constitutional: Positive for malaise/fatigue. Negative for chills, fever and weight loss.  HENT: Negative for nosebleeds and sore throat.   Eyes: Negative for blurred vision.  Respiratory: Positive for shortness of breath. Negative for cough and wheezing.   Cardiovascular: Negative for chest pain, orthopnea, leg swelling and PND.  Gastrointestinal: Negative for abdominal pain, constipation, diarrhea, heartburn, nausea and vomiting.  Genitourinary: Negative for dysuria and urgency.  Musculoskeletal: Negative for back pain.  Skin: Negative for rash.  Neurological: Positive for weakness. Negative for dizziness, speech change, focal weakness and headaches.  Endo/Heme/Allergies: Does not bruise/bleed easily.  Psychiatric/Behavioral: Negative for depression.    DRUG ALLERGIES:   Allergies  Allergen Reactions  . Amlodipine Swelling     [Onset: 08/26/2013]  . Codeine Nausea Only  . Sulfa Antibiotics Rash and Nausea Only   VITALS:  Blood pressure 113/69, pulse (!) 59, temperature 98.4 F (36.9 C), resp. rate 14, height 5\' 4"  (1.626 m), weight 51 kg (112 lb 6.4 oz), SpO2 (!) 84 %. PHYSICAL EXAMINATION:  Physical Exam  Constitutional: She is oriented to person, place, and time and well-developed, well-nourished, and in no distress.  HENT:  Head: Normocephalic and atraumatic.  Eyes: Conjunctivae and EOM are normal. Pupils are equal, round, and reactive to light.  Neck: Normal range of motion. Neck supple. No tracheal deviation present. No thyromegaly present.  Cardiovascular: Normal rate,  regular rhythm and normal heart sounds.   Pulmonary/Chest: Effort normal and breath sounds normal. No respiratory distress. She has no wheezes. She exhibits no tenderness.  Abdominal: Soft. Bowel sounds are normal. She exhibits no distension. There is no tenderness.  Musculoskeletal: Normal range of motion.  Neurological: She is alert and oriented to person, place, and time. No cranial nerve deficit.  Skin: Skin is warm and dry. No rash noted.  Psychiatric: Mood and affect normal.   LABORATORY PANEL:   CBC  Recent Labs Lab 10/31/16 0401  WBC 6.2  HGB 14.4  HCT 41.5  PLT 129*   ------------------------------------------------------------------------------------------------------------------ Chemistries   Recent Labs Lab 10/31/16 0401  NA 124*  K 2.9*  CL 89*  CO2 25  GLUCOSE 124*  BUN 5*  CREATININE 0.47  CALCIUM 8.4*  MG 1.5*   RADIOLOGY:  Dg Chest 2 View  Result Date: 10/30/2016 CLINICAL DATA:  Hypertension. EXAM: CHEST  2 VIEW COMPARISON:  10/13/2016. FINDINGS: Mediastinum and hilar structures normal. Cardiomegaly with normal pulmonary vascularity. No focal infiltrate noted. No pleural effusion or pneumothorax. Left mastectomy. No acute or focal bony abnormality. Degenerative changes thoracic spine. IMPRESSION: 1.  No acute cardiopulmonary disease.  Stable cardiomegaly. 2. Left mastectomy . Electronically Signed   By: Marcello Moores  Register   On: 10/30/2016 17:43   ASSESSMENT AND PLAN:  82 y.o. female who presents with an episode of acute shortness of breath. Patient has had significantly increased blood pressure in the recent past which had been difficult to control , Requiring ED and urgent care visits  * Hyponatremia - Strong suspicion for hyponatremia being due to thiazide use and recent increase in her  dosage , although she is somewhat confused about which medication she is taking and exact dosing. (I have a feeling she has been taking lot more hydrochlorothiazide than  Hydralazine as she indicated she was taking 3 times a day, HCTZ).   * Hypokalemia/hypomagnesemia - We will replete and recheck, could be due to diuretic use and dehydration  * Uncontrolled hypertension - continue home meds.  much better control    * COPD (chronic obstructive pulmonary disease) ([place]) - continue home meds and inhalers, she was hypoxic, so has been placed on 2 L oxygen  * GERD (gastroesophageal reflux disease) - home dose PPI     All the records are reviewed and case discussed with Care Management/Social Worker. Management plans discussed with the patient, family and they are in agreement.  CODE STATUS: FULL CODE  TOTAL TIME TAKING CARE OF THIS PATIENT: 25 minutes.   More than 50% of the time was spent in counseling/coordination of care: YES  POSSIBLE D/C IN 1-2 DAYS, DEPENDING ON CLINICAL CONDITION.  And electrolyte status   Max Sane M.D on 10/31/2016 at 4:08 PM  Between 7am to 6pm - Pager - [phone]  After 6pm go to [email or web address] - Proofreader  [facility]  Office  [phone]  CC: Primary care physician; Dion Body, MD  Note: This dictation was prepared with Dragon dictation along with smaller phrase technology. Any transcriptional errors that result from this process are unintentional.

## 2016-10-31 NOTE — Progress Notes (Signed)
Pt c/o feeling weak all over and generally poorly.  VS WNL.  BP 113/69, HR 69. O2 sats on RA 88%.  Started 2L O2.  Sats increased to 91%

## 2016-11-01 ENCOUNTER — Inpatient Hospital Stay: Payer: PPO

## 2016-11-01 DIAGNOSIS — I159 Secondary hypertension, unspecified: Secondary | ICD-10-CM | POA: Diagnosis not present

## 2016-11-01 DIAGNOSIS — Z79899 Other long term (current) drug therapy: Secondary | ICD-10-CM | POA: Diagnosis not present

## 2016-11-01 DIAGNOSIS — Z888 Allergy status to other drugs, medicaments and biological substances status: Secondary | ICD-10-CM | POA: Diagnosis not present

## 2016-11-01 DIAGNOSIS — Z7951 Long term (current) use of inhaled steroids: Secondary | ICD-10-CM | POA: Diagnosis not present

## 2016-11-01 DIAGNOSIS — R0902 Hypoxemia: Secondary | ICD-10-CM | POA: Diagnosis not present

## 2016-11-01 DIAGNOSIS — Z853 Personal history of malignant neoplasm of breast: Secondary | ICD-10-CM | POA: Diagnosis not present

## 2016-11-01 DIAGNOSIS — Z9010 Acquired absence of unspecified breast and nipple: Secondary | ICD-10-CM | POA: Diagnosis not present

## 2016-11-01 DIAGNOSIS — I517 Cardiomegaly: Secondary | ICD-10-CM | POA: Diagnosis not present

## 2016-11-01 DIAGNOSIS — I10 Essential (primary) hypertension: Secondary | ICD-10-CM | POA: Diagnosis not present

## 2016-11-01 DIAGNOSIS — Z87891 Personal history of nicotine dependence: Secondary | ICD-10-CM | POA: Diagnosis not present

## 2016-11-01 DIAGNOSIS — Z79810 Long term (current) use of selective estrogen receptor modulators (SERMs): Secondary | ICD-10-CM | POA: Diagnosis not present

## 2016-11-01 DIAGNOSIS — M810 Age-related osteoporosis without current pathological fracture: Secondary | ICD-10-CM | POA: Diagnosis not present

## 2016-11-01 DIAGNOSIS — J90 Pleural effusion, not elsewhere classified: Secondary | ICD-10-CM | POA: Diagnosis not present

## 2016-11-01 DIAGNOSIS — R262 Difficulty in walking, not elsewhere classified: Secondary | ICD-10-CM | POA: Diagnosis not present

## 2016-11-01 DIAGNOSIS — E876 Hypokalemia: Secondary | ICD-10-CM | POA: Diagnosis not present

## 2016-11-01 DIAGNOSIS — E871 Hypo-osmolality and hyponatremia: Secondary | ICD-10-CM | POA: Diagnosis not present

## 2016-11-01 DIAGNOSIS — J449 Chronic obstructive pulmonary disease, unspecified: Secondary | ICD-10-CM | POA: Diagnosis not present

## 2016-11-01 DIAGNOSIS — Z9181 History of falling: Secondary | ICD-10-CM | POA: Diagnosis not present

## 2016-11-01 DIAGNOSIS — E222 Syndrome of inappropriate secretion of antidiuretic hormone: Secondary | ICD-10-CM | POA: Diagnosis not present

## 2016-11-01 DIAGNOSIS — Z882 Allergy status to sulfonamides status: Secondary | ICD-10-CM | POA: Diagnosis not present

## 2016-11-01 DIAGNOSIS — Z7982 Long term (current) use of aspirin: Secondary | ICD-10-CM | POA: Diagnosis not present

## 2016-11-01 DIAGNOSIS — K219 Gastro-esophageal reflux disease without esophagitis: Secondary | ICD-10-CM | POA: Diagnosis not present

## 2016-11-01 DIAGNOSIS — Z7401 Bed confinement status: Secondary | ICD-10-CM | POA: Diagnosis not present

## 2016-11-01 DIAGNOSIS — Z9012 Acquired absence of left breast and nipple: Secondary | ICD-10-CM | POA: Diagnosis not present

## 2016-11-01 DIAGNOSIS — F329 Major depressive disorder, single episode, unspecified: Secondary | ICD-10-CM | POA: Diagnosis not present

## 2016-11-01 DIAGNOSIS — I739 Peripheral vascular disease, unspecified: Secondary | ICD-10-CM | POA: Diagnosis not present

## 2016-11-01 DIAGNOSIS — J441 Chronic obstructive pulmonary disease with (acute) exacerbation: Secondary | ICD-10-CM | POA: Diagnosis not present

## 2016-11-01 DIAGNOSIS — I509 Heart failure, unspecified: Secondary | ICD-10-CM | POA: Diagnosis not present

## 2016-11-01 DIAGNOSIS — R0600 Dyspnea, unspecified: Secondary | ICD-10-CM | POA: Diagnosis not present

## 2016-11-01 DIAGNOSIS — M6281 Muscle weakness (generalized): Secondary | ICD-10-CM | POA: Diagnosis not present

## 2016-11-01 DIAGNOSIS — R41841 Cognitive communication deficit: Secondary | ICD-10-CM | POA: Diagnosis not present

## 2016-11-01 DIAGNOSIS — Z95820 Peripheral vascular angioplasty status with implants and grafts: Secondary | ICD-10-CM | POA: Diagnosis not present

## 2016-11-01 DIAGNOSIS — Z452 Encounter for adjustment and management of vascular access device: Secondary | ICD-10-CM | POA: Diagnosis not present

## 2016-11-01 DIAGNOSIS — R0602 Shortness of breath: Secondary | ICD-10-CM | POA: Diagnosis not present

## 2016-11-01 LAB — CBC
HCT: 34.3 % — ABNORMAL LOW (ref 35.0–47.0)
Hemoglobin: 12 g/dL (ref 12.0–16.0)
MCH: 30.7 pg (ref 26.0–34.0)
MCHC: 34.9 g/dL (ref 32.0–36.0)
MCV: 88.1 fL (ref 80.0–100.0)
PLATELETS: 95 10*3/uL — AB (ref 150–440)
RBC: 3.89 MIL/uL (ref 3.80–5.20)
RDW: 13 % (ref 11.5–14.5)
WBC: 4.1 10*3/uL (ref 3.6–11.0)

## 2016-11-01 LAB — BASIC METABOLIC PANEL
Anion gap: 3 — ABNORMAL LOW (ref 5–15)
BUN: 9 mg/dL (ref 6–20)
CO2: 25 mmol/L (ref 22–32)
CREATININE: 0.53 mg/dL (ref 0.44–1.00)
Calcium: 7.6 mg/dL — ABNORMAL LOW (ref 8.9–10.3)
Chloride: 96 mmol/L — ABNORMAL LOW (ref 101–111)
GFR calc Af Amer: >60 mL/min (ref >60)
Glucose, Bld: 99 mg/dL (ref 65–99)
POTASSIUM: 4.2 mmol/L (ref 3.5–5.1)
SODIUM: 124 mmol/L — AB (ref 135–145)

## 2016-11-01 LAB — BRAIN NATRIURETIC PEPTIDE: B NATRIURETIC PEPTIDE 5: 597 pg/mL — AB (ref 0.0–100.0)

## 2016-11-01 LAB — MAGNESIUM: MAGNESIUM: 2.1 mg/dL (ref 1.7–2.4)

## 2016-11-01 LAB — SODIUM: Sodium: 119 mmol/L — CL (ref 135–145)

## 2016-11-01 LAB — URIC ACID: Uric Acid, Serum: 2.6 mg/dL (ref 2.3–6.6)

## 2016-11-01 LAB — TSH: TSH: 2.78 u[IU]/mL (ref 0.350–4.500)

## 2016-11-01 LAB — OSMOLALITY, URINE: Osmolality, Ur: 580 mOsm/kg (ref 300–900)

## 2016-11-01 LAB — OSMOLALITY: Osmolality: 256 mOsm/kg — ABNORMAL LOW (ref 275–295)

## 2016-11-01 LAB — SODIUM, URINE, RANDOM: Sodium, Ur: 33 mmol/L

## 2016-11-01 MED ORDER — FUROSEMIDE 10 MG/ML IJ SOLN
20.0000 mg | Freq: Once | INTRAMUSCULAR | Status: AC
  Administered 2016-11-01: 20 mg via INTRAVENOUS
  Filled 2016-11-01: qty 2

## 2016-11-01 MED ORDER — ALBUTEROL SULFATE (2.5 MG/3ML) 0.083% IN NEBU
2.5000 mg | INHALATION_SOLUTION | Freq: Four times a day (QID) | RESPIRATORY_TRACT | Status: DC | PRN
  Administered 2016-11-01 – 2016-11-04 (×4): 2.5 mg via RESPIRATORY_TRACT
  Filled 2016-11-01 (×4): qty 3

## 2016-11-01 NOTE — Progress Notes (Signed)
Patient called RN to room complaining of worsening shortness of breath after eating supper. She felt like her upper chest/throat was congested but she can't cough anything out. O2 sats 99% on 2L. Listened to lung sounds with RT, Shannon - faint expiratory wheeze and congestion/crackles. Patient says this has been an ongoing issue for her but feels like it's worse. Dr. Posey Pronto paged - 20 IV lasix, portable chest xray.

## 2016-11-01 NOTE — Progress Notes (Signed)
[facility] [place] [facility] [place] NAME: Melanie Huffman    MR#:  [id number]  DATE OF BIRTH:  10-13-1928  SUBJECTIVE:  CHIEF COMPLAINT:   Chief Complaint  Patient presents with  . Shortness of Breath  slept very well. Wants to go home. No issues per RN REVIEW OF SYSTEMS:  Review of Systems  Constitutional: Positive for malaise/fatigue. Negative for chills, fever and weight loss.  HENT: Negative for nosebleeds and sore throat.   Eyes: Negative for blurred vision.  Respiratory: Negative for cough, shortness of breath and wheezing.   Cardiovascular: Negative for chest pain, orthopnea, leg swelling and PND.  Gastrointestinal: Negative for abdominal pain, constipation, diarrhea, heartburn, nausea and vomiting.  Genitourinary: Negative for dysuria and urgency.  Musculoskeletal: Negative for back pain.  Skin: Negative for rash.  Neurological: Positive for weakness. Negative for dizziness, speech change, focal weakness and headaches.  Endo/Heme/Allergies: Does not bruise/bleed easily.  Psychiatric/Behavioral: Negative for depression.    DRUG ALLERGIES:   Allergies  Allergen Reactions  . Amlodipine Swelling     [Onset: 08/26/2013]  . Codeine Nausea Only  . Sulfa Antibiotics Rash and Nausea Only   VITALS:  Blood pressure (!) 121/59, pulse 68, temperature 98.2 F (36.8 C), temperature source Oral, resp. rate 18, height 5\' 4"  (1.626 m), weight 51 kg (112 lb 6.4 oz), SpO2 99 %. PHYSICAL EXAMINATION:  Physical Exam  Constitutional: She is oriented to person, place, and time and well-developed, well-nourished, and in no distress.  HENT:  Head: Normocephalic and atraumatic.  Eyes: Conjunctivae and EOM are normal. Pupils are equal, round, and reactive to light.  Neck: Normal range of motion. Neck supple. No tracheal deviation present. No thyromegaly present.  Cardiovascular: Normal rate, regular rhythm and normal heart sounds.   Pulmonary/Chest: Effort normal  and breath sounds normal. No respiratory distress. She has no wheezes. She exhibits no tenderness.  Abdominal: Soft. Bowel sounds are normal. She exhibits no distension. There is no tenderness.  Musculoskeletal: Normal range of motion.  Neurological: She is alert and oriented to person, place, and time. No cranial nerve deficit.  Skin: Skin is warm and dry. No rash noted.  Psychiatric: Mood and affect normal.   LABORATORY PANEL:   CBC  Recent Labs Lab 11/01/16 0454  WBC 4.1  HGB 12.0  HCT 34.3*  PLT 95*   ------------------------------------------------------------------------------------------------------------------ Chemistries   Recent Labs Lab 11/01/16 0454  NA 124*  K 4.2  CL 96*  CO2 25  GLUCOSE 99  BUN 9  CREATININE 0.53  CALCIUM 7.6*  MG 2.1   RADIOLOGY:  No results found. ASSESSMENT AND PLAN:  82 y.o. female who presents with an episode of acute shortness of breath. Patient has had significantly increased blood pressure in the recent past which had been difficult to control , Requiring ED and urgent care visits  * Hyponatremia - Strong suspicion for hyponatremia being due to thiazide use and recent increase in her dosage, although she is somewhat confused about which medication she is taking and exact dosing. (I have a feeling she has been taking lot more hydrochlorothiazide than Hydralazine as she indicated she was taking 3 times a day, HCTZ).  -118---121---124-124  * Hypokalemia/hypomagnesemia - repleted - could be due to diuretic use and dehydration  * Uncontrolled hypertension - continue home meds.  much better control    * COPD (chronic obstructive pulmonary disease) ([place]) - continue home meds and inhalers, she was hypoxic, so has been placed  on 2 L oxygen  * GERD (gastroesophageal reflux disease) - home dose PPI  Pt requesting to go home Check Na at 2 pm today PT to eval   All the records are reviewed and case discussed with Care  Management/Social Worker. Management plans discussed with the patient, family and they are in agreement.  CODE STATUS: FULL CODE  TOTAL TIME TAKING CARE OF THIS PATIENT: 25 minutes.   More than 50% of the time was spent in counseling/coordination of care: YES  POSSIBLE D/C IN 1-2 DAYS, DEPENDING ON CLINICAL CONDITION.  And electrolyte status   Melanie Huffman M.D on 11/01/2016 at 6:55 AM  Between 7am to 6pm - Pager - [phone]  After 6pm go to [email or web address] - Proofreader  [facility]  Office  [phone]  CC: Primary care physician; Dion Body, MD  Note: This dictation was prepared with Dragon dictation along with smaller phrase technology. Any transcriptional errors that result from this process are unintentional.

## 2016-11-01 NOTE — Care Management Obs Status (Signed)
[facility] NOTIFICATION   Patient Details  Name: Melanie Huffman MRN: [id number] Date of Birth: 08/11/1928   Medicare Observation Status Notification Given:  Yes    Ival Bible, RN 11/01/2016, 2:07 PM

## 2016-11-01 NOTE — Progress Notes (Addendum)
Pt up in chair by Physical Therapy. Refused chair/bed alarm. Educated on safety, agreed to call before getting up.

## 2016-11-01 NOTE — Progress Notes (Signed)
About 5 minutes after nurse tech rounded on pt., alarm went off. Upon arrival to room pt was observed sitting  Down on the floor right next to bed. The pt states, " I just sat on the floor"  Pt has no injuiries, denies any type of pain. No skin breakdowns observed.Pt was helped back to bed by nursing staff, pt went back to sleep. House supervisor, MD Jannifer Franklin, Mudlogger of nursing [place], and daughter Soulliere made aware. Will continue to monitor pt.

## 2016-11-01 NOTE — Progress Notes (Addendum)
Dr. Posey Pronto paged regarding low sodium level. - Per MD, order routine nephrology consult for Dr. Holley Raring.

## 2016-11-01 NOTE — Evaluation (Signed)
Physical Therapy Evaluation Patient Details Name: Melanie Huffman MRN: [id number] DOB: 1928-09-29 Today's Date: 11/01/2016   History of Present Illness  82 yo female with onset of SOB and low Na+ was admitted, repleted and will be expecting home alone.  PMHx:  SOB, COPD, HTN, smoker, breast CA, asthma, SDH traumatic  Clinical Impression  Pt is up to walk with IV pole and will use RW next visit, but pt is not understanding her safety issues.  Her plan is to try to go directly home but has PMHx of SDH and it is not clear that she respects the fall risk she has.  Will continue acutely to see her for gait and strengthening and will focus on her safety and needs in case her SNF stay is not approved.    Follow Up Recommendations SNF;Supervision for mobility/OOB    Equipment Recommendations  Rolling walker with 5" wheels    Recommendations for Other Services Rehab consult     Precautions / Restrictions Precautions Precautions: Fall (telemetry, VERY impulsive) Precaution Comments: Pt has poor awareness of safety Restrictions Weight Bearing Restrictions: No      Mobility  Bed Mobility Overal bed mobility: Needs Assistance Bed Mobility: Supine to Sit     Supine to sit: Min assist;HOB elevated     General bed mobility comments: reminders for sequence and safety  Transfers Overall transfer level: Needs assistance Equipment used: 1 person hand held assist (IV pole) Transfers: Sit to/from [employer] Transfers Sit to Stand: Min assist;Mod assist (higher assist to power up from the [employer]) Stand pivot transfers: Min assist       General transfer comment: cues for sequence and safety but pt will not generally follow well  Ambulation/Gait Ambulation/Gait assistance: Min assist (with IV pole) Ambulation Distance (Feet): 38 Feet Assistive device: 1 person hand held assist (IV pole due to lack of space in the room) Gait Pattern/deviations: Step-to pattern;Wide base of  support;Shuffle;Decreased stride length Gait velocity: reduced Gait velocity interpretation: Below normal speed for age/gender    Stairs            Wheelchair Mobility    Modified Rankin (Stroke Patients Only)       Balance                                             Pertinent Vitals/Pain Pain Assessment: No/denies pain    Home Living Family/patient expects to be discharged to:: Private residence Living Arrangements: Alone   Type of Home: House Home Access: Level entry     Home Layout: One level Home Equipment: None Additional Comments: Pt is not a good historian    Prior Function Level of Independence: Independent (according to pt)         Comments: no family to inform PT about PLOF     Hand Dominance        Extremity/Trunk Assessment   Upper Extremity Assessment: Generalized weakness           Lower Extremity Assessment: Generalized weakness      Cervical / Trunk Assessment: Kyphotic  Communication   Communication: HOH  Cognition Arousal/Alertness: Awake/alert Behavior During Therapy: Impulsive;Restless Overall Cognitive Status: No family/caregiver present to determine baseline cognitive functioning       Memory: Decreased recall of precautions;Decreased short-term memory              General  Comments General comments (skin integrity, edema, etc.): Pt has impulsive nature, fragile skin and PMHx of SDH    Exercises     Assessment/Plan    PT Assessment Patient needs continued PT services  PT Problem List Decreased strength;Decreased range of motion;Decreased activity tolerance;Decreased balance;Decreased mobility;Decreased cognition;Decreased coordination;Decreased knowledge of use of DME;Decreased safety awareness;Decreased knowledge of precautions;Cardiopulmonary status limiting activity;Decreased skin integrity          PT Treatment Interventions DME instruction;Gait training;Functional mobility  training;Therapeutic activities;Therapeutic exercise;Balance training;Neuromuscular re-education;Patient/family education    PT Goals (Current goals can be found in the Care Plan section)  Acute Rehab PT Goals Patient Stated Goal: to get home when able PT Goal Formulation: With patient Time For Goal Achievement: 11/15/16 Potential to Achieve Goals: Good    Frequency Min 2X/week   Barriers to discharge Decreased caregiver support      Co-evaluation               End of Session Equipment Utilized During Treatment: Gait belt Activity Tolerance: Patient tolerated treatment well;Other (comment) (limited by weakness) Patient left: in chair;with call bell/phone within reach (refused nursing to put chair alarm in) Nurse Communication: Mobility status (safety)    Functional Assessment Tool Used: clinical judgment Functional Limitation: Mobility: Walking and moving around Mobility: Walking and Moving Around Current Status [id number]): At least 40 percent but less than 60 percent impaired, limited or restricted Mobility: Walking and Moving Around Goal Status [phone]): At least 1 percent but less than 20 percent impaired, limited or restricted    Time: 0829-0853 PT Time Calculation (min) (ACUTE ONLY): 24 min   Charges:   PT Evaluation $PT Eval Moderate Complexity: 1 Procedure PT Treatments $Gait Training: 8-22 mins   PT G Codes:   PT G-Codes **NOT FOR INPATIENT CLASS** Functional Assessment Tool Used: clinical judgment Functional Limitation: Mobility: Walking and moving around Mobility: Walking and Moving Around Current Status [id number]): At least 40 percent but less than 60 percent impaired, limited or restricted Mobility: Walking and Moving Around Goal Status [phone]): At least 1 percent but less than 20 percent impaired, limited or restricted    Ramond Dial 11/01/2016, 10:10 AM    Mee Hives, PT MS Acute Rehab Dept. Number: [place] and [place]

## 2016-11-01 NOTE — Progress Notes (Signed)
Patient became very short of breath with a 2-3 step pivot to [facility]. O2 Sats dropped into the 80's on RA. 2L O2 applied, sats now 93%. Patient requesting breathing treatment. Ok to order PRN albuterol tx per Dr. Posey Pronto.

## 2016-11-01 NOTE — Progress Notes (Signed)
Patient complaining of neck/shoulder soreness. Requesting heating pad - ok to order K Pad per Dr. Doy Hutching.

## 2016-11-02 ENCOUNTER — Inpatient Hospital Stay: Admit: 2016-11-02 | Payer: PPO

## 2016-11-02 ENCOUNTER — Inpatient Hospital Stay: Payer: PPO

## 2016-11-02 LAB — BASIC METABOLIC PANEL
ANION GAP: 5 (ref 5–15)
BUN: 10 mg/dL (ref 6–20)
CALCIUM: 7.9 mg/dL — AB (ref 8.9–10.3)
CO2: 25 mmol/L (ref 22–32)
Chloride: 90 mmol/L — ABNORMAL LOW (ref 101–111)
Creatinine, Ser: 0.65 mg/dL (ref 0.44–1.00)
GLUCOSE: 142 mg/dL — AB (ref 65–99)
Potassium: 4.3 mmol/L (ref 3.5–5.1)
SODIUM: 120 mmol/L — AB (ref 135–145)

## 2016-11-02 LAB — CBC
HCT: 34.5 % — ABNORMAL LOW (ref 35.0–47.0)
Hemoglobin: 12 g/dL (ref 12.0–16.0)
MCH: 30.7 pg (ref 26.0–34.0)
MCHC: 34.7 g/dL (ref 32.0–36.0)
MCV: 88.4 fL (ref 80.0–100.0)
PLATELETS: 117 10*3/uL — AB (ref 150–440)
RBC: 3.9 MIL/uL (ref 3.80–5.20)
RDW: 13.1 % (ref 11.5–14.5)
WBC: 5.9 10*3/uL (ref 3.6–11.0)

## 2016-11-02 LAB — SODIUM
SODIUM: 118 mmol/L — AB (ref 135–145)
Sodium: 118 mmol/L — CL (ref 135–145)

## 2016-11-02 LAB — CORTISOL: Cortisol, Plasma: 14.2 ug/dL

## 2016-11-02 MED ORDER — DOCUSATE SODIUM 100 MG PO CAPS
100.0000 mg | ORAL_CAPSULE | Freq: Every day | ORAL | Status: DC
  Administered 2016-11-02 – 2016-11-04 (×3): 100 mg via ORAL
  Filled 2016-11-02 (×3): qty 1

## 2016-11-02 MED ORDER — SODIUM CHLORIDE 3 % IV SOLN
INTRAVENOUS | Status: DC
  Administered 2016-11-02 – 2016-11-03 (×2): 25 mL/h via INTRAVENOUS
  Filled 2016-11-02 (×4): qty 500

## 2016-11-02 MED ORDER — FUROSEMIDE 20 MG PO TABS: 20.0000 mg | ORAL_TABLET | Freq: Every day | ORAL | Status: DC

## 2016-11-02 NOTE — Progress Notes (Signed)
Patient seems to be feeling a little better this morning. Up in chair for breakfast. Requesting colace once a day - ok to order per Dr. Posey Pronto. No morning labs today, order for BMP and CBC per Dr. Posey Pronto.

## 2016-11-02 NOTE — Consult Note (Signed)
[facility] CONSULT NOTE    Date: 11/02/2016                  Patient Name:  Melanie Huffman  MRN: [id number]  DOB: [birth year]  Age / Sex: 82 y.o., female         PCP: Dion Body, MD                 Service Requesting Consult: Hospitalist                 Reason for Consult: Hyponatremia            History of Present Illness: Patient is a 82 y.o. female with a PMHx of Asthma, left breast cancer status post mastectomy, COPD, hypertension, peripheral vascular disease, history of subdural hematoma, who was admitted to [facility] on 10/30/2016 for evaluation of shortness of breath. Patient had been seen by her primary care physician recently for upper respiratory infection. The patient has been having progressive shortness of breath.  We are now asked evaluate her for worsening hyponatremia. Patient has had fluctuating serum sodium.  Upon admission her serum sodium was low at 118. At the moment serum sodium is 120. Her baseline appears to be 130.  Patient has a low serum osmolality of 256. Urine osmolality is quite high at 580 with a urine sodium of 33. In addition the patient has uric acid of 2.6. These labs suggest underlying SIADH most likely from her underlying pulmonary process.   Medications: Outpatient medications: Prescriptions Prior to Admission  Medication Sig Dispense Refill Last Dose  . acetaminophen (TYLENOL) 325 MG tablet Take 2 tablets (650 mg total) by mouth every 6 (six) hours as needed for mild pain (or Fever >/= 101). 30 tablet 0 10/30/2016 at 1200  . Ascorbic Acid (VITAMIN C PO) Take 1 capsule by mouth daily.   Past Month at Unknown time  . aspirin EC 81 MG tablet Take 81 mg by mouth daily.   10/30/2016 at 0700  . Calcium Carbonate-Vitamin D (CALCIUM + D PO) Take 1 tablet by mouth 2 (two) times daily.   10/30/2016 at 0700  . carvedilol (COREG) 25 MG tablet Take 25 mg by mouth 2 (two) times daily with a meal.   10/30/2016 at 0700  . cloNIDine  (CATAPRES) 0.1 MG tablet Take 1 tablet (0.1 mg total) by mouth 3 (three) times daily. 30 tablet 0 10/30/2016 at 1200  . Fluticasone-Salmeterol (ADVAIR DISKUS) 250-50 MCG/DOSE AEPB Inhale 1 puff into the lungs 2 (two) times daily.   10/30/2016 at 0700  . Multiple Vitamins-Minerals (CENTRUM SILVER PO) Take 1 tablet by mouth daily.   10/30/2016 at 0700  . omeprazole (PRILOSEC) 40 MG capsule Take 40 mg by mouth daily.    10/30/2016 at 0700  . potassium chloride SA (K-DUR,KLOR-CON) 20 MEQ tablet Take 20 mEq by mouth 2 (two) times daily.    10/30/2016 at 0700  . raloxifene (EVISTA) 60 MG tablet Take 60 mg by mouth daily.   10/30/2016 at 0700  . ramipril (ALTACE) 10 MG capsule Take 20 mg by mouth daily.    10/30/2016 at 0700  . tiotropium (SPIRIVA) 18 MCG inhalation capsule Place 18 mcg into inhaler and inhale daily.    10/30/2016 at 0700    Current medications: Current Facility-Administered Medications  Medication Dose Route Frequency Provider Last Rate Last Dose  . acetaminophen (TYLENOL) tablet 650 mg  650 mg Oral Q6H PRN Lance Coon, MD  Or  . acetaminophen (TYLENOL) suppository 650 mg  650 mg Rectal Q6H PRN Lance Coon, MD      . albuterol (PROVENTIL) (2.5 MG/3ML) 0.083% nebulizer solution 2.5 mg  2.5 mg Nebulization Q6H PRN Fritzi Mandes, MD   2.5 mg at 11/01/16 1943  . aspirin EC tablet 81 mg  81 mg Oral Daily Lance Coon, MD   81 mg at 11/02/16 [id number]  . carvedilol (COREG) tablet 25 mg  25 mg Oral BID WC Lance Coon, MD   25 mg at 11/02/16 [id number]  . cloNIDine (CATAPRES) tablet 0.1 mg  0.1 mg Oral TID Lance Coon, MD   0.1 mg at 11/02/16 0902  . guaiFENesin (MUCINEX) 12 hr tablet 600 mg  600 mg Oral BID Lance Coon, MD   600 mg at 11/01/16 0948   And  . dextromethorphan (DELSYM) 30 MG/5ML liquid 30 mg  30 mg Oral BID Lance Coon, MD   30 mg at 11/02/16 0906  . docusate sodium (COLACE) capsule 100 mg  100 mg Oral Daily Fritzi Mandes, MD   100 mg at 11/02/16 1030  . enoxaparin (LOVENOX)  injection 40 mg  40 mg Subcutaneous Q24H Lance Coon, MD   40 mg at 11/01/16 2131  . furosemide (LASIX) tablet 20 mg  20 mg Oral Daily Fritzi Mandes, MD      . labetalol (NORMODYNE,TRANDATE) injection 10 mg  10 mg Intravenous Q2H PRN Lance Coon, MD      . loratadine (CLARITIN) tablet 10 mg  10 mg Oral Daily Lance Coon, MD   10 mg at 11/02/16 0902  . mometasone-formoterol (DULERA) 200-5 MCG/ACT inhaler 2 puff  2 puff Inhalation BID Lance Coon, MD   2 puff at 11/02/16 0907  . ondansetron (ZOFRAN) tablet 4 mg  4 mg Oral Q6H PRN Lance Coon, MD       Or  . ondansetron [facility]) injection 4 mg  4 mg Intravenous Q6H PRN Lance Coon, MD      . pantoprazole (PROTONIX) EC tablet 40 mg  40 mg Oral Daily Lance Coon, MD   40 mg at 11/02/16 0902  . PARoxetine (PAXIL) tablet 20 mg  20 mg Oral Daily Max Sane, MD   20 mg at 11/02/16 0903  . potassium chloride SA (K-DUR,KLOR-CON) CR tablet 20 mEq  20 mEq Oral BID Max Sane, MD   20 mEq at 11/02/16 0902  . raloxifene (EVISTA) tablet 60 mg  60 mg Oral Daily Lance Coon, MD   60 mg at 11/02/16 0903  . ramipril (ALTACE) capsule 20 mg  20 mg Oral Daily Lance Coon, MD   20 mg at 11/02/16 0902  . sodium chloride flush (NS) 0.9 % injection 3 mL  3 mL Intravenous Q12H Lance Coon, MD   3 mL at 11/02/16 0908  . theophylline (THEO-24) 24 hr capsule 100 mg  100 mg Oral BID Lance Coon, MD      . tiotropium [facility]) inhalation capsule 18 mcg  18 mcg Inhalation Daily Lance Coon, MD   18 mcg at 11/02/16 0907  . zolpidem (AMBIEN) tablet 5 mg  5 mg Oral QHS PRN Max Sane, MD   5 mg at 11/01/16 2131      Allergies: Allergies  Allergen Reactions  . Amlodipine Swelling     [Onset: 08/26/2013]  . Codeine Nausea Only  . Sulfa Antibiotics Rash and Nausea Only      Past Medical History: Past Medical History:  Diagnosis Date  . Asthma   .  Breast cancer ([place]) 1991   mastectomy left  . Cancer ([place]) 1991   L Breast  . COPD (chronic obstructive pulmonary  disease) ([place])   . Heart murmur   . Hypertension   . Peripheral vascular disease ([place])   . Shortness of breath dyspnea   . Traumatic subdural hematoma [facility])      Past Surgical History: Past Surgical History:  Procedure Laterality Date  . ABDOMINAL HYSTERECTOMY     Partial  . APPENDECTOMY    . BREAST BIOPSY Right    1980 and 1970's  . BREAST SURGERY  1991   L Mastectomy  . MASTECTOMY    . ORIF PATELLA Left 04/12/2015   Procedure: OPEN REDUCTION INTERNAL (ORIF) FIXATION PATELLA;  Surgeon: Hessie Knows, MD;  Location: [facility] ORS;  Service: Orthopedics;  Laterality: Left;  . PERIPHERAL VASCULAR CATHETERIZATION Left 04/17/2015   Procedure: Lower Extremity Angiography;  Surgeon: Katha Cabal, MD;  Location: [place] CV LAB;  Service: Cardiovascular;  Laterality: Left;  . PERIPHERAL VASCULAR CATHETERIZATION  04/17/2015   Procedure: Lower Extremity Intervention;  Surgeon: Katha Cabal, MD;  Location: [place] [facility];  Service: Cardiovascular;;  . TONSILLECTOMY       Family History: Family History  Problem Relation Age of Onset  . CVA Mother   . Kidney disease Mother   . Heart disease Father   . Kidney disease Cousin 30  . Kidney disease Cousin 73  . Breast cancer Neg Hx      Social History: Social History   Social History  . Marital status: Widowed    Spouse name: N/A  . Number of children: N/A  . Years of education: N/A   Occupational History  . Not on file.   Social History Main Topics  . Smoking status: Former Smoker    Packs/day: 0.50    Years: 20.00    Types: Cigarettes    Quit date: 01/26/1979  . Smokeless tobacco: Never Used  . Alcohol use No  . Drug use: No  . Sexual activity: Not on file   Other Topics Concern  . Not on file   Social History Narrative  . No narrative on file     Review of Systems: Review of Systems  Constitutional: Positive for malaise/fatigue. Negative for chills, fever and weight loss.  HENT: Negative for  ear pain, hearing loss, nosebleeds and tinnitus.   Eyes: Negative for blurred vision, double vision and photophobia.  Respiratory: Positive for cough, shortness of breath and wheezing.   Cardiovascular: Negative for chest pain, palpitations and orthopnea.  Gastrointestinal: Negative for heartburn, nausea and vomiting.  Genitourinary: Negative for dysuria, frequency and urgency.  Musculoskeletal: Negative for myalgias and neck pain.  Skin: Negative for itching and rash.  Neurological: Positive for weakness. Negative for dizziness, focal weakness and headaches.  Endo/Heme/Allergies: Negative for polydipsia. Does not bruise/bleed easily.  Psychiatric/Behavioral: Negative for depression. The patient is nervous/anxious.      Vital Signs: Blood pressure (!) 95/57, pulse (!) 56, temperature 98.5 F (36.9 C), temperature source Oral, resp. rate 18, height 5\' 4"  (1.626 m), weight 51 kg (112 lb 6.4 oz), SpO2 98 %.  Weight trends: Filed Weights   10/30/16 1628 10/31/16 0236  Weight: 53.1 kg (117 lb) 51 kg (112 lb 6.4 oz)    Physical Exam: General: NAD, sitting up in low chair  Head: Normocephalic, atraumatic.  Eyes: Anicteric, EOMI  Nose: Mucous membranes moist, not inflammed, nonerythematous.  Throat: Oropharynx nonerythematous, no exudate appreciated.  Neck: Supple, trachea midline.  Lungs:  Normal respiratory effort. Bilateral rhonchi, mild wheezing also  Heart: S1S2 no rubs  Abdomen:  BS normoactive. Soft, Nondistended, non-tender.  No masses or organomegaly.  Extremities: No pretibial edema.  Neurologic: A&O X3, Motor strength is 5/5 in the all 4 extremities  Skin: No visible rashes    Lab results: Basic Metabolic Panel:  Recent Labs Lab 10/31/16 0401 11/01/16 0454 11/01/16 1406 11/02/16 1027  NA 124* 124* 119* 120*  K 2.9* 4.2  --  4.3  CL 89* 96*  --  90*  CO2 25 25  --  25  GLUCOSE 124* 99  --  142*  BUN 5* 9  --  10  CREATININE 0.47 0.53  --  0.65  CALCIUM 8.4*  7.6*  --  7.9*  MG 1.5* 2.1  --   --     Liver Function Tests: No results for input(s): AST, ALT, ALKPHOS, BILITOT, PROT, ALBUMIN in the last 168 hours. No results for input(s): LIPASE, AMYLASE in the last 168 hours. No results for input(s): AMMONIA in the last 168 hours.  CBC:  Recent Labs Lab 10/30/16 1631 10/31/16 0401 11/01/16 0454 11/02/16 1027  WBC 4.0 6.2 4.1 5.9  HGB 13.6 14.4 12.0 12.0  HCT 39.6 41.5 34.3* 34.5*  MCV 88.8 88.0 88.1 88.4  PLT 127* 129* 95* 117*    Cardiac Enzymes: No results for input(s): CKTOTAL, CKMB, CKMBINDEX, TROPONINI in the last 168 hours.  BNP: Invalid input(s): POCBNP  CBG: No results for input(s): GLUCAP in the last 168 hours.  Microbiology: Results for orders placed or performed during the hospital encounter of 07/09/16  MRSA PCR Screening     Status: None   Collection Time: 07/10/16  7:45 AM  Result Value Ref Range Status   MRSA by PCR NEGATIVE NEGATIVE Final    Comment:        The GeneXpert MRSA Assay (FDA approved for NASAL specimens only), is one component of a comprehensive MRSA colonization surveillance program. It is not intended to diagnose MRSA infection nor to guide or monitor treatment for MRSA infections.     Coagulation Studies: No results for input(s): LABPROT, INR in the last 72 hours.  Urinalysis:  Recent Labs  10/31/16 0102  COLORURINE STRAW*  LABSPEC 1.002*  PHURINE 8.0  GLUCOSEU 50*  HGBUR NEGATIVE  BILIRUBINUR NEGATIVE  KETONESUR 1+*  PROTEINUR NEGATIVE  NITRITE NEGATIVE  LEUKOCYTESUR NEGATIVE      Imaging: Dg Chest Port 1 View  Result Date: 11/01/2016 CLINICAL DATA:  Worsening shortness of breath. Wheezing. Hypertension. Hyponatremia. EXAM: PORTABLE CHEST 1 VIEW COMPARISON:  10/30/2016. FINDINGS: Stable enlarged cardiac silhouette. Aortic arch calcifications. The lungs remain hyperexpanded with diffusely prominent interstitial markings. Diffuse osteopenia. IMPRESSION: No acute  abnormality.  Stable cardiomegaly and changes of COPD. Electronically Signed   By: Claudie Revering M.D.   On: 11/01/2016 18:03      Assessment & Plan: Pt is a 82 y.o. female with a PMHx of Asthma, left breast cancer status post mastectomy, COPD, hypertension, peripheral vascular disease, history of subdural hematoma, who was admitted to [facility] on 10/30/2016 for evaluation of shortness of breath.  Question as to whether there was some confusion about her antihypertensives, patient may have taken HCTZ in place of hydralazine.  Problem List: 1.  Hyponatremia. 2.  Hypertension. 3.  Acute COPD exacerbation.  Plan:  We will consulted for the evaluation and management of hyponatremia. Case discussed with Dr. Posey Pronto. There was some  concern initially that the patient may have taken hydrochlorothiazide in place of hydralazine and may potentially taken it 3-4 times per day. However the patient has been provided some IV fluid hydration without significant improvement in her serum sodium. Patient now has low serum osmolality, high urine osmolality, and relatively low uric acid all of which suggest SIADH. This may be secondary to her underlying pulmonary process. We will obtain CT of the chest without contrast to further evaluate. We will also place a PICC line and start the patient on 3% saline at 25 cc per hour and follow serum sodiums every 6 hours. Recommend discontinuation of 3% saline if sodium reaches 128 or higher within the next 24 hours. Blood pressure now under better control. We will hold Lasix. Thanks for consultation.

## 2016-11-02 NOTE — Progress Notes (Signed)
Spoke to [place] and [facility]. PICC RN should be at hospital around 1630 this afternoon.

## 2016-11-03 ENCOUNTER — Inpatient Hospital Stay
Admit: 2016-11-03 | Discharge: 2016-11-03 | Disposition: A | Discharge status: Home / Self Care | Payer: PPO | Attending: Internal Medicine | Admitting: Internal Medicine

## 2016-11-03 LAB — ECHOCARDIOGRAM COMPLETE
HEIGHTINCHES: 64 in
Weight: 1798.4 oz

## 2016-11-03 LAB — SODIUM
SODIUM: 123 mmol/L — AB (ref 135–145)
SODIUM: 122 mmol/L — AB (ref 135–145)
SODIUM: 124 mmol/L — AB (ref 135–145)
Sodium: 119 mmol/L — CL (ref 135–145)

## 2016-11-03 MED ORDER — POLYETHYLENE GLYCOL 3350 17 G PO PACK
17.0000 g | PACK | Freq: Every day | ORAL | Status: DC
  Administered 2016-11-03 – 2016-11-04 (×2): 17 g via ORAL
  Filled 2016-11-03 (×2): qty 1

## 2016-11-03 MED ORDER — CITALOPRAM HYDROBROMIDE 20 MG PO TABS
10.0000 mg | ORAL_TABLET | Freq: Every day | ORAL | Status: DC
  Administered 2016-11-03 – 2016-11-04 (×2): 10 mg via ORAL
  Filled 2016-11-03 (×2): qty 1

## 2016-11-03 NOTE — Progress Notes (Signed)
*  PRELIMINARY RESULTS* Echocardiogram 2D Echocardiogram has been performed.  Melanie Huffman 11/03/2016, 10:04 AM

## 2016-11-03 NOTE — Progress Notes (Signed)
Physical Therapy Treatment Patient Details Name: Melanie Huffman MRN: [id number] DOB: Apr 13, 1928 Today's Date: 11/03/2016    History of Present Illness 82 yo female with onset of SOB and low Na+ was admitted, repleted and will be expecting home alone.  PMHx:  SOB, COPD, HTN, smoker, breast CA, asthma, SDH traumatic    PT Comments    Pt presented in chair, stating feeling weak however agreeable to therapy. Tolerated seated therex as noted well. Upon standing pt requesting to use BR, ambulated to toilet with decreased safety awareness particularly noted with IV line. Required modA sit to stand from standard toilet however demonstrated F balance with donning clothes. After seated rest pt able to tolerate further ambulation with IV pole for support however required numerous cues  for safety.  She would continue to benefit from skilled PT to increase endurance and safety with mobility.   Follow Up Recommendations  SNF;Supervision for mobility/OOB     Equipment Recommendations  Rolling walker with 5" wheels    Recommendations for Other Services       Precautions / Restrictions Precautions Precautions: Fall Restrictions Weight Bearing Restrictions: No    Mobility  Bed Mobility               General bed mobility comments: Pt presented in chair  Transfers Overall transfer level: Needs assistance Equipment used: 1 person hand held assist Transfers: Sit to/from Stand Sit to Stand: Mod assist (modA from toilet)         General transfer comment: decreased safety awareness   Ambulation/Gait Ambulation/Gait assistance: Min assist Ambulation Distance (Feet): 45 Feet Assistive device: 1 person hand held assist (use of IV pole ) Gait Pattern/deviations: Step-through pattern;Trunk flexed;Narrow base of support   Gait velocity interpretation: Below normal speed for age/gender     Stairs            Wheelchair Mobility    Modified Rankin (Stroke Patients Only)        Balance                                    Cognition Arousal/Alertness: Lethargic Behavior During Therapy: Flat affect Overall Cognitive Status: Within Functional Limits for tasks assessed       Memory: Decreased recall of precautions              Exercises General Exercises - Lower Extremity Ankle Circles/Pumps: AROM;Strengthening;Both;15 reps;Seated Long Arc Quad: AROM;Strengthening;Both;15 reps;Seated Hip ABduction/ADduction: AROM;Strengthening;Both;15 reps;Seated Hip Flexion/Marching: AROM;Strengthening;Both;15 reps;Seated Other Exercises Other Exercises: Pillow Squeezes x 15 bilaterally, manually resisted HS pulls x 15 bilaterally.     General Comments General comments (skin integrity, edema, etc.): Decreased safety awarenss      Pertinent Vitals/Pain Pain Assessment: No/denies pain    Home Living                      Prior Function            PT Goals (current goals can now be found in the care plan section) Acute Rehab PT Goals Patient Stated Goal: to get home when able Progress towards PT goals: Progressing toward goals    Frequency    Min 2X/week      PT Plan      Co-evaluation             End of Session Equipment Utilized During Treatment: Gait belt;Oxygen Activity Tolerance: Patient tolerated treatment  well;Patient limited by lethargy Patient left: in chair;with call bell/phone within reach;with chair alarm set     Time: 1329-1403 PT Time Calculation (min) (ACUTE ONLY): 34 min  Charges:  $Therapeutic Exercise: 8-22 mins $Therapeutic Activity: 8-22 mins                    G Codes:      Lylith Bebeau  Antawan Mchugh, PTA 11/03/2016, 3:52 PM

## 2016-11-03 NOTE — Care Management (Signed)
Patient admitted 12/2.  She is currently receiving 3% sodium chloride. Physical therapy has evaluated and recommending skilled nursing.  Patient and her daughter are in agreement. Daughter has mentioned that she considered taking patient back to [place] with her but it is a three hours drive, patient would not be near her friends, and no physician in [place].  Patient has had profound increase in weakness due to her low sodium level

## 2016-11-03 NOTE — Progress Notes (Signed)
[facility] [place] NAME: Melanie Huffman    MR#:  [id number]  DATE OF BIRTH:  07-26-1928  SUBJECTIVE:  CHIEF COMPLAINT:   Chief Complaint  Patient presents with  . Shortness of Breath  slept very well. Wants to go home. No issues per RN REVIEW OF SYSTEMS:  Review of Systems  Constitutional: Positive for malaise/fatigue. Negative for chills, fever and weight loss.  HENT: Negative for nosebleeds and sore throat.   Eyes: Negative for blurred vision.  Respiratory: Negative for cough, shortness of breath and wheezing.   Cardiovascular: Negative for chest pain, orthopnea, leg swelling and PND.  Gastrointestinal: Negative for abdominal pain, constipation, diarrhea, heartburn, nausea and vomiting.  Genitourinary: Negative for dysuria and urgency.  Musculoskeletal: Negative for back pain.  Skin: Negative for rash.  Neurological: Positive for weakness. Negative for dizziness, speech change, focal weakness and headaches.  Endo/Heme/Allergies: Does not bruise/bleed easily.  Psychiatric/Behavioral: Negative for depression.    DRUG ALLERGIES:   Allergies  Allergen Reactions  . Amlodipine Swelling     [Onset: 08/26/2013]  . Codeine Nausea Only  . Sulfa Antibiotics Rash and Nausea Only   VITALS:  Blood pressure (!) 98/58, pulse (!) 59, temperature 98.2 F (36.8 C), temperature source Oral, resp. rate 18, height 5\' 4"  (1.626 m), weight 51 kg (112 lb 6.4 oz), SpO2 97 %. PHYSICAL EXAMINATION:  Physical Exam  Constitutional: She is oriented to person, place, and time and well-developed, well-nourished, and in no distress.  HENT:  Head: Normocephalic and atraumatic.  Eyes: Conjunctivae and EOM are normal. Pupils are equal, round, and reactive to light.  Neck: Normal range of motion. Neck supple. No tracheal deviation present. No thyromegaly present.  Cardiovascular: Normal rate, regular rhythm and normal heart sounds.   Pulmonary/Chest: Effort  normal and breath sounds normal. No respiratory distress. She has no wheezes. She exhibits no tenderness.  Abdominal: Soft. Bowel sounds are normal. She exhibits no distension. There is no tenderness.  Musculoskeletal: Normal range of motion.  Neurological: She is alert and oriented to person, place, and time. No cranial nerve deficit.  Skin: Skin is warm and dry. No rash noted.  Psychiatric: Mood and affect normal.   LABORATORY PANEL:   CBC  Recent Labs Lab 11/02/16 1027  WBC 5.9  HGB 12.0  HCT 34.5*  PLT 117*   ------------------------------------------------------------------------------------------------------------------ Chemistries   Recent Labs Lab 11/01/16 0454  11/02/16 1027  11/03/16 0930  NA 124*  < > 120*  < > 123*  K 4.2  --  4.3  --   --   CL 96*  --  90*  --   --   CO2 25  --  25  --   --   GLUCOSE 99  --  142*  --   --   BUN 9  --  10  --   --   CREATININE 0.53  --  0.65  --   --   CALCIUM 7.6*  --  7.9*  --   --   MG 2.1  --   --   --   --   < > = values in this interval not displayed. RADIOLOGY:  Ct Chest Wo Contrast  Result Date: 11/02/2016 CLINICAL DATA:  PMHx of Asthma, left breast cancer status post mastectomy, COPD, hypertension, peripheral vascular disease, history of subdural hematoma, who was admitted to [facility] on 10/30/2016 for evaluation of shortness of breath. Patient had been seen by  her primary care physician recently for upper respiratory infection. The patient has been having progressive shortness of breath. We are now asked evaluate her for worsening hyponatremia--per MD note EXAM: CT CHEST WITHOUT CONTRAST TECHNIQUE: Multidetector CT imaging of the chest was performed following the standard protocol without IV contrast. COMPARISON:  Chest radiograph, 11/01/2016. FINDINGS: Cardiovascular: Heart is mildly enlarged. There are mild coronary artery calcifications. The aorta is normal in caliber. Atherosclerotic calcifications are noted along the  thoracic aorta and at the origin of the branch vessels. There is mild enlargement of the right and left pulmonary arteries both measuring 2.8 cm. Mediastinum/Nodes: No mediastinal or hilar masses. No discrete enlarged lymph nodes. Mild dilation of the esophagus. Trachea is unremarkable. Lungs/Pleura: Small bilateral pleural effusions. Advanced changes of centrilobular emphysema. There is bronchial wall thickening most evident in the lower lobes, seen to a lesser degree in the upper lobes and right middle lobe. Mild lower lung zone interstitial thickening. There is an irregular focal area of opacity in the posterior left upper lobe adjacent to the oblique fissure, centered on image 62, series 3, measuring an average of 1 cm in size. In the posterior, dependent lower lobes, there is hazy and reticular type opacities, which is likely atelectasis. Right greater than left apical pleural parenchymal scarring is noted. No pneumothorax. Upper Abdomen: No acute abnormality. Musculoskeletal: Old sternal fracture. Bones are demineralized. No osteoblastic or osteolytic lesions. Changes from a left mastectomy.  No chest wall mass. IMPRESSION: 1. Small bilateral pleural effusions, mild dependent lower lobe atelectasis and mild interstitial thickening. Consider mild congestive heart failure superimposed on changes of advanced centrilobular emphysema. 2. There is also bronchial wall thickening that is most prominent in the lower lobes. This may all be chronic. Acute bronchial inflammation/infection should be considered in the proper clinical setting. 3. No convincing pneumonia. 4. 1 cm irregular nodular opacity in the left upper lobe. Consider one of the following in 3 months for both low-risk and high-risk individuals: (a) repeat chest CT, (b) follow-up PET-CT, or (c) tissue sampling. This recommendation follows the consensus statement: Guidelines for Management of Incidental Pulmonary Nodules Detected on CT Images: From the  Fleischner Society 2017; Radiology 2017; 284:228-243. Since this may be atelectasis or inflammatory in origin, follow-up chest CT in 3 months is favored. 5. Mild enlargement of the pulmonary arteries consistent with pulmonary hypertension. Electronically Signed   By: Lajean Manes M.D.   On: 11/02/2016 15:08   Dg Chest Port 1 View  Result Date: 11/02/2016 CLINICAL DATA:  Confirm line placement EXAM: PORTABLE CHEST 1 VIEW COMPARISON:  Chest CT from earlier today FINDINGS: Right upper extremity PICC with tip at the SVC level. Otherwise no change in the chest from CT earlier today. Hyperinflation and generalized interstitial opacities. Chronic cardiomegaly. Stable mediastinal contours. IMPRESSION: New right upper extremity PICC with tip at the SVC. Electronically Signed   By: Monte Fantasia M.D.   On: 11/02/2016 17:25   ASSESSMENT AND PLAN:  82 y.o. female who presents with an episode of acute shortness of breath. Patient has had significantly increased blood pressure in the recent past which had been difficult to control , Requiring ED and urgent care visits  * Hyponatremia - Strong suspicion for hyponatremia being due to thiazide use and recent increase in her dosage, although she is somewhat confused about which medication she is taking and exact dosing. (I have a feeling she has been taking lot more hydrochlorothiazide than Hydralazine as she indicated she was taking 3  times a day, HCTZ).  -118---121---124-124--118---119 -nephrology ocnsult  * Hypokalemia/hypomagnesemia - repleted - could be due to diuretic use and dehydration  * Uncontrolled hypertension - continue home meds.  much better control    * COPD (chronic obstructive pulmonary disease) ([place]) - continue home meds and inhalers, she was hypoxic, so has been placed on 2 L oxygen  * GERD (gastroesophageal reflux disease) - home dose PPI  Pt requesting to go home Check Na at 2 pm today PT to eval   All the records are reviewed and  case discussed with Care Management/Social Worker. Management plans discussed with the patient, family and they are in agreement.  CODE STATUS: FULL CODE  TOTAL TIME TAKING CARE OF THIS PATIENT: 25 minutes.   More than 50% of the time was spent in counseling/coordination of care: YES  POSSIBLE D/C IN 1-2 DAYS, DEPENDING ON CLINICAL CONDITION.  And electrolyte status   Cidney Kirkwood M.D on 11/03/2016 at 1:18 PM  Between 7am to 6pm - Pager - [phone]  After 6pm go to [email or web address] - Proofreader  [facility]  Office  [phone]  CC: Primary care physician; Dion Body, MD  Note: This dictation was prepared with Dragon dictation along with smaller phrase technology. Any transcriptional errors that result from this process are unintentional.

## 2016-11-03 NOTE — Progress Notes (Signed)
[place] [facility] [place] NAME: Melanie Huffman    MR#:  [id number]  DATE OF BIRTH:  [birth year]  SUBJECTIVE:  Out int he chair. dter in the room REVIEW OF SYSTEMS:  Review of Systems  Constitutional: Positive for malaise/fatigue. Negative for chills, fever and weight loss.  HENT: Negative for nosebleeds and sore throat.   Eyes: Negative for blurred vision.  Respiratory: Negative for cough, shortness of breath and wheezing.   Cardiovascular: Negative for chest pain, orthopnea, leg swelling and PND.  Gastrointestinal: Negative for abdominal pain, constipation, diarrhea, heartburn, nausea and vomiting.  Genitourinary: Negative for dysuria and urgency.  Musculoskeletal: Negative for back pain.  Skin: Negative for rash.  Neurological: Positive for weakness. Negative for dizziness, speech change, focal weakness and headaches.  Endo/Heme/Allergies: Does not bruise/bleed easily.  Psychiatric/Behavioral: Negative for depression.    DRUG ALLERGIES:   Allergies  Allergen Reactions  . Amlodipine Swelling     [Onset: 08/26/2013]  . Codeine Nausea Only  . Sulfa Antibiotics Rash and Nausea Only   VITALS:  Blood pressure (!) 98/58, pulse (!) 59, temperature 98.2 F (36.8 C), temperature source Oral, resp. rate 18, height 5\' 4"  (1.626 m), weight 51 kg (112 lb 6.4 oz), SpO2 97 %. PHYSICAL EXAMINATION:  Physical Exam  Constitutional: She is oriented to person, place, and time and well-developed, well-nourished, and in no distress.  HENT:  Head: Normocephalic and atraumatic.  Eyes: Conjunctivae and EOM are normal. Pupils are equal, round, and reactive to light.  Neck: Normal range of motion. Neck supple. No tracheal deviation present. No thyromegaly present.  Cardiovascular: Normal rate, regular rhythm and normal heart sounds.   Pulmonary/Chest: Effort normal and breath sounds normal. No respiratory distress. She has no wheezes. She exhibits no tenderness.    Abdominal: Soft. Bowel sounds are normal. She exhibits no distension. There is no tenderness.  Musculoskeletal: Normal range of motion.  Neurological: She is alert and oriented to person, place, and time. No cranial nerve deficit.  Skin: Skin is warm and dry. No rash noted.  Psychiatric: Mood and affect normal.   LABORATORY PANEL:   CBC  Recent Labs Lab 11/02/16 1027  WBC 5.9  HGB 12.0  HCT 34.5*  PLT 117*   ------------------------------------------------------------------------------------------------------------------ Chemistries   Recent Labs Lab 11/01/16 0454  11/02/16 1027  11/03/16 0930  NA 124*  < > 120*  < > 123*  K 4.2  --  4.3  --   --   CL 96*  --  90*  --   --   CO2 25  --  25  --   --   GLUCOSE 99  --  142*  --   --   BUN 9  --  10  --   --   CREATININE 0.53  --  0.65  --   --   CALCIUM 7.6*  --  7.9*  --   --   MG 2.1  --   --   --   --   < > = values in this interval not displayed. RADIOLOGY:  Ct Chest Wo Contrast  Result Date: 11/02/2016 CLINICAL DATA:  PMHx of Asthma, left breast cancer status post mastectomy, COPD, hypertension, peripheral vascular disease, history of subdural hematoma, who was admitted to [facility] on 10/30/2016 for evaluation of shortness of breath. Patient had been seen by her primary care physician recently for upper respiratory infection. The patient has been having progressive shortness of breath.  We are now asked evaluate her for worsening hyponatremia--per MD note EXAM: CT CHEST WITHOUT CONTRAST TECHNIQUE: Multidetector CT imaging of the chest was performed following the standard protocol without IV contrast. COMPARISON:  Chest radiograph, 11/01/2016. FINDINGS: Cardiovascular: Heart is mildly enlarged. There are mild coronary artery calcifications. The aorta is normal in caliber. Atherosclerotic calcifications are noted along the thoracic aorta and at the origin of the branch vessels. There is mild enlargement of the right and left  pulmonary arteries both measuring 2.8 cm. Mediastinum/Nodes: No mediastinal or hilar masses. No discrete enlarged lymph nodes. Mild dilation of the esophagus. Trachea is unremarkable. Lungs/Pleura: Small bilateral pleural effusions. Advanced changes of centrilobular emphysema. There is bronchial wall thickening most evident in the lower lobes, seen to a lesser degree in the upper lobes and right middle lobe. Mild lower lung zone interstitial thickening. There is an irregular focal area of opacity in the posterior left upper lobe adjacent to the oblique fissure, centered on image 62, series 3, measuring an average of 1 cm in size. In the posterior, dependent lower lobes, there is hazy and reticular type opacities, which is likely atelectasis. Right greater than left apical pleural parenchymal scarring is noted. No pneumothorax. Upper Abdomen: No acute abnormality. Musculoskeletal: Old sternal fracture. Bones are demineralized. No osteoblastic or osteolytic lesions. Changes from a left mastectomy.  No chest wall mass. IMPRESSION: 1. Small bilateral pleural effusions, mild dependent lower lobe atelectasis and mild interstitial thickening. Consider mild congestive heart failure superimposed on changes of advanced centrilobular emphysema. 2. There is also bronchial wall thickening that is most prominent in the lower lobes. This may all be chronic. Acute bronchial inflammation/infection should be considered in the proper clinical setting. 3. No convincing pneumonia. 4. 1 cm irregular nodular opacity in the left upper lobe. Consider one of the following in 3 months for both low-risk and high-risk individuals: (a) repeat chest CT, (b) follow-up PET-CT, or (c) tissue sampling. This recommendation follows the consensus statement: Guidelines for Management of Incidental Pulmonary Nodules Detected on CT Images: From the Fleischner Society 2017; Radiology 2017; 284:228-243. Since this may be atelectasis or inflammatory in  origin, follow-up chest CT in 3 months is favored. 5. Mild enlargement of the pulmonary arteries consistent with pulmonary hypertension. Electronically Signed   By: Lajean Manes M.D.   On: 11/02/2016 15:08   Dg Chest Port 1 View  Result Date: 11/02/2016 CLINICAL DATA:  Confirm line placement EXAM: PORTABLE CHEST 1 VIEW COMPARISON:  Chest CT from earlier today FINDINGS: Right upper extremity PICC with tip at the SVC level. Otherwise no change in the chest from CT earlier today. Hyperinflation and generalized interstitial opacities. Chronic cardiomegaly. Stable mediastinal contours. IMPRESSION: New right upper extremity PICC with tip at the SVC. Electronically Signed   By: Monte Fantasia M.D.   On: 11/02/2016 17:25   ASSESSMENT AND PLAN:  82 y.o. female who presents with an episode of acute shortness of breath. Patient has had significantly increased blood pressure in the recent past which had been difficult to control , Requiring ED and urgent care visits  * Hyponatremia - Strong suspicion for hyponatremia being due to thiazide use and recent increase in her dosage, although she is somewhat confused about which medication she is taking and exact dosing. (I have a feeling she has been taking lot more hydrochlorothiazide than Hydralazine as she indicated she was taking 3 times a day, HCTZ).  -118---121---124-124--118---119---hypertonic saline---123 -nephrology consult appreciated--->appears SIADH   -stop paxil, start celexa -CT  chest shows emphysema and 1 cm left UL mass. Will have pt see oncology as out pt  * Hypokalemia/hypomagnesemia - repleted - could be due to diuretic use and dehydration  *Hypertension - continue home meds.  much better control    * COPD (chronic obstructive pulmonary disease) ([facility]) - continue home meds and inhalers, she was hypoxic, so has been placed on 2 L oxygen  * GERD (gastroesophageal reflux disease) - home dose PPI  PT to eval pending Pt will benefit from  rehab.  D/w dter   All the records are reviewed and case discussed with Care Management/Social Worker. Management plans discussed with the patient, family and they are in agreement.  CODE STATUS: FULL CODE  TOTAL TIME TAKING CARE OF THIS PATIENT: 25 minutes.   More than 50% of the time was spent in counseling/coordination of care: YES  POSSIBLE D/C IN 1-2 DAYS, DEPENDING ON CLINICAL CONDITION.  And electrolyte status   Adyen Bifulco M.D on 11/03/2016 at 1:19 PM  Between 7am to 6pm - Pager - [phone]  After 6pm go to [email or web address] - Proofreader  [facility]  Office  [phone]  CC: Primary care physician; Dion Body, MD  Note: This dictation was prepared with Dragon dictation along with smaller phrase technology. Any transcriptional errors that result from this process are unintentional.

## 2016-11-03 NOTE — Clinical Social Work Note (Signed)
MSW received consult that patient needs SNF for short term rehab.  MSW to meet with patient and discuss SNF placement process at a later time.  MSW to continue to follow patient's progress throughout discharge planning.  [name]. Norval Morton, MSW [phone]  Mon-Fri 8a-4:30p 11/03/2016 5:27 PM

## 2016-11-03 NOTE — Progress Notes (Signed)
[facility]  ROUNDING NOTE   Subjective:   Daughter at bedside.  UOP 1000 Na 123  Hypertonic saline 3% at 45mL/hr  Objective:  Vital signs in last 24 hours:  Temp:  [98.2 F (36.8 C)] 98.2 F (36.8 C) (12/04 1134) Pulse Rate:  [57-69] 59 (12/04 1134) Resp:  [18] 18 (12/04 1134) BP: (98-162)/(48-76) 98/58 (12/04 1134) SpO2:  [96 %-99 %] 97 % (12/04 1134)  Weight change:  Filed Weights   10/30/16 1628 10/31/16 0236  Weight: 53.1 kg (117 lb) 51 kg (112 lb 6.4 oz)    Intake/Output: I/O last 3 completed shifts: In: 861.7 [P.O.:720; I.V.:141.7] Out: 2950 [Urine:2950]   Intake/Output this shift:  Total I/O In: 360 [P.O.:360] Out: 0   Physical Exam: General: NAD, laying in bed  Head: Normocephalic, atraumatic. Moist oral mucosal membranes  Eyes: Anicteric, PERRL  Neck: Supple, trachea midline  Lungs:  Clear to auscultation  Heart: Regular rate and rhythm  Abdomen:  Soft, nontender,   Extremities: no peripheral edema.  Neurologic: Nonfocal, moving all four extremities  Skin: No lesions       Basic Metabolic Panel:  Recent Labs Lab 10/30/16 1631 10/30/16 2148 10/31/16 0401 11/01/16 0454  11/02/16 1027 11/02/16 1325 11/02/16 1857 11/03/16 0048 11/03/16 0930  NA 118* 121* 124* 124*  < > 120* 118* 118* 119* 123*  K 3.9 3.4* 2.9* 4.2  --  4.3  --   --   --   --   CL 86* 88* 89* 96*  --  90*  --   --   --   --   CO2 22 26 25 25   --  25  --   --   --   --   GLUCOSE 128* 118* 124* 99  --  142*  --   --   --   --   BUN 9 7 5* 9  --  10  --   --   --   --   CREATININE 0.48 0.49 0.47 0.53  --  0.65  --   --   --   --   CALCIUM 8.4* 8.1* 8.4* 7.6*  --  7.9*  --   --   --   --   MG  --   --  1.5* 2.1  --   --   --   --   --   --   < > = values in this interval not displayed.  Liver Function Tests: No results for input(s): AST, ALT, ALKPHOS, BILITOT, PROT, ALBUMIN in the last 168 hours. No results for input(s): LIPASE, AMYLASE in the last 168  hours. No results for input(s): AMMONIA in the last 168 hours.  CBC:  Recent Labs Lab 10/30/16 1631 10/31/16 0401 11/01/16 0454 11/02/16 1027  WBC 4.0 6.2 4.1 5.9  HGB 13.6 14.4 12.0 12.0  HCT 39.6 41.5 34.3* 34.5*  MCV 88.8 88.0 88.1 88.4  PLT 127* 129* 95* 117*    Cardiac Enzymes: No results for input(s): CKTOTAL, CKMB, CKMBINDEX, TROPONINI in the last 168 hours.  BNP: Invalid input(s): POCBNP  CBG: No results for input(s): GLUCAP in the last 168 hours.  Microbiology: Results for orders placed or performed during the hospital encounter of 07/09/16  MRSA PCR Screening     Status: None   Collection Time: 07/10/16  7:45 AM  Result Value Ref Range Status   MRSA by PCR NEGATIVE NEGATIVE Final    Comment:  The GeneXpert MRSA Assay (FDA approved for NASAL specimens only), is one component of a comprehensive MRSA colonization surveillance program. It is not intended to diagnose MRSA infection nor to guide or monitor treatment for MRSA infections.     Coagulation Studies: No results for input(s): LABPROT, INR in the last 72 hours.  Urinalysis: No results for input(s): COLORURINE, LABSPEC, PHURINE, GLUCOSEU, HGBUR, BILIRUBINUR, KETONESUR, PROTEINUR, UROBILINOGEN, NITRITE, LEUKOCYTESUR in the last 72 hours.  Invalid input(s): APPERANCEUR    Imaging: Ct Chest Wo Contrast  Result Date: 11/02/2016 CLINICAL DATA:  PMHx of Asthma, left breast cancer status post mastectomy, COPD, hypertension, peripheral vascular disease, history of subdural hematoma, who was admitted to [facility] on 10/30/2016 for evaluation of shortness of breath. Patient had been seen by her primary care physician recently for upper respiratory infection. The patient has been having progressive shortness of breath. We are now asked evaluate her for worsening hyponatremia--per MD note EXAM: CT CHEST WITHOUT CONTRAST TECHNIQUE: Multidetector CT imaging of the chest was performed following the standard  protocol without IV contrast. COMPARISON:  Chest radiograph, 11/01/2016. FINDINGS: Cardiovascular: Heart is mildly enlarged. There are mild coronary artery calcifications. The aorta is normal in caliber. Atherosclerotic calcifications are noted along the thoracic aorta and at the origin of the branch vessels. There is mild enlargement of the right and left pulmonary arteries both measuring 2.8 cm. Mediastinum/Nodes: No mediastinal or hilar masses. No discrete enlarged lymph nodes. Mild dilation of the esophagus. Trachea is unremarkable. Lungs/Pleura: Small bilateral pleural effusions. Advanced changes of centrilobular emphysema. There is bronchial wall thickening most evident in the lower lobes, seen to a lesser degree in the upper lobes and right middle lobe. Mild lower lung zone interstitial thickening. There is an irregular focal area of opacity in the posterior left upper lobe adjacent to the oblique fissure, centered on image 62, series 3, measuring an average of 1 cm in size. In the posterior, dependent lower lobes, there is hazy and reticular type opacities, which is likely atelectasis. Right greater than left apical pleural parenchymal scarring is noted. No pneumothorax. Upper Abdomen: No acute abnormality. Musculoskeletal: Old sternal fracture. Bones are demineralized. No osteoblastic or osteolytic lesions. Changes from a left mastectomy.  No chest wall mass. IMPRESSION: 1. Small bilateral pleural effusions, mild dependent lower lobe atelectasis and mild interstitial thickening. Consider mild congestive heart failure superimposed on changes of advanced centrilobular emphysema. 2. There is also bronchial wall thickening that is most prominent in the lower lobes. This may all be chronic. Acute bronchial inflammation/infection should be considered in the proper clinical setting. 3. No convincing pneumonia. 4. 1 cm irregular nodular opacity in the left upper lobe. Consider one of the following in 3 months for  both low-risk and high-risk individuals: (a) repeat chest CT, (b) follow-up PET-CT, or (c) tissue sampling. This recommendation follows the consensus statement: Guidelines for Management of Incidental Pulmonary Nodules Detected on CT Images: From the Fleischner Society 2017; Radiology 2017; 284:228-243. Since this may be atelectasis or inflammatory in origin, follow-up chest CT in 3 months is favored. 5. Mild enlargement of the pulmonary arteries consistent with pulmonary hypertension. Electronically Signed   By: Lajean Manes M.D.   On: 11/02/2016 15:08   Dg Chest Port 1 View  Result Date: 11/02/2016 CLINICAL DATA:  Confirm line placement EXAM: PORTABLE CHEST 1 VIEW COMPARISON:  Chest CT from earlier today FINDINGS: Right upper extremity PICC with tip at the SVC level. Otherwise no change in the chest from CT earlier today.  Hyperinflation and generalized interstitial opacities. Chronic cardiomegaly. Stable mediastinal contours. IMPRESSION: New right upper extremity PICC with tip at the SVC. Electronically Signed   By: Monte Fantasia M.D.   On: 11/02/2016 17:25   Dg Chest Port 1 View  Result Date: 11/01/2016 CLINICAL DATA:  Worsening shortness of breath. Wheezing. Hypertension. Hyponatremia. EXAM: PORTABLE CHEST 1 VIEW COMPARISON:  10/30/2016. FINDINGS: Stable enlarged cardiac silhouette. Aortic arch calcifications. The lungs remain hyperexpanded with diffusely prominent interstitial markings. Diffuse osteopenia. IMPRESSION: No acute abnormality.  Stable cardiomegaly and changes of COPD. Electronically Signed   By: Claudie Revering M.D.   On: 11/01/2016 18:03     Medications:   . sodium chloride (hypertonic) 25 mL/hr (11/03/16 1134)   . aspirin EC  81 mg Oral Daily  . carvedilol  25 mg Oral BID WC  . citalopram  10 mg Oral Daily  . cloNIDine  0.1 mg Oral TID  . guaiFENesin  600 mg Oral BID   And  . dextromethorphan  30 mg Oral BID  . docusate sodium  100 mg Oral Daily  . enoxaparin (LOVENOX)  injection  40 mg Subcutaneous Q24H  . loratadine  10 mg Oral Daily  . mometasone-formoterol  2 puff Inhalation BID  . pantoprazole  40 mg Oral Daily  . potassium chloride  20 mEq Oral BID  . raloxifene  60 mg Oral Daily  . ramipril  20 mg Oral Daily  . sodium chloride flush  3 mL Intravenous Q12H  . theophylline  100 mg Oral BID  . tiotropium  18 mcg Inhalation Daily   acetaminophen **OR** acetaminophen, albuterol, labetalol, ondansetron **OR** ondansetron (ZOFRAN) IV, zolpidem  Assessment/ Plan:  Ms. Melanie Huffman is a 82 y.o. white female with COPD/Asthma, left breast cancer status post mastectomy,  hypertension, peripheral vascular disease, history of subdural hematoma, who was admitted to [facility] on 10/30/2016   1. Hyponatremia: started on paxil and may have been taking hydrochlorothiazide instead of hydralazine.  - Continue hypertonic salne - Discontinue SSRI, try another class of anti-depressant.   2. Hypertension: history of poor control. Now with some hypotension. Family thinks her blood pressure is anxiety driven.  - carvedilol, clonidine, ramipril.   3. Hypokalemia - potassium chloride.  - magnesium replaced  4. Acute exacerbation of COPD: continue supportive care.    LOS: Waucoma, Jearlean Demauro 12/4/201712:45 PM

## 2016-11-04 ENCOUNTER — Encounter
Admission: RE | Admit: 2016-11-04 | Discharge: 2016-11-04 | Disposition: A | Discharge status: Home / Self Care | Payer: PPO | Source: Ambulatory Visit | Attending: Internal Medicine | Admitting: Internal Medicine

## 2016-11-04 DIAGNOSIS — E876 Hypokalemia: Secondary | ICD-10-CM | POA: Diagnosis not present

## 2016-11-04 DIAGNOSIS — Z95820 Peripheral vascular angioplasty status with implants and grafts: Secondary | ICD-10-CM | POA: Diagnosis not present

## 2016-11-04 DIAGNOSIS — R262 Difficulty in walking, not elsewhere classified: Secondary | ICD-10-CM | POA: Diagnosis not present

## 2016-11-04 DIAGNOSIS — M810 Age-related osteoporosis without current pathological fracture: Secondary | ICD-10-CM | POA: Diagnosis not present

## 2016-11-04 DIAGNOSIS — E871 Hypo-osmolality and hyponatremia: Secondary | ICD-10-CM | POA: Insufficient documentation

## 2016-11-04 DIAGNOSIS — K219 Gastro-esophageal reflux disease without esophagitis: Secondary | ICD-10-CM | POA: Diagnosis not present

## 2016-11-04 DIAGNOSIS — I10 Essential (primary) hypertension: Secondary | ICD-10-CM | POA: Insufficient documentation

## 2016-11-04 DIAGNOSIS — J449 Chronic obstructive pulmonary disease, unspecified: Secondary | ICD-10-CM | POA: Diagnosis not present

## 2016-11-04 DIAGNOSIS — Z79810 Long term (current) use of selective estrogen receptor modulators (SERMs): Secondary | ICD-10-CM | POA: Diagnosis not present

## 2016-11-04 DIAGNOSIS — F329 Major depressive disorder, single episode, unspecified: Secondary | ICD-10-CM | POA: Diagnosis not present

## 2016-11-04 DIAGNOSIS — E222 Syndrome of inappropriate secretion of antidiuretic hormone: Secondary | ICD-10-CM | POA: Diagnosis not present

## 2016-11-04 DIAGNOSIS — M6281 Muscle weakness (generalized): Secondary | ICD-10-CM | POA: Diagnosis not present

## 2016-11-04 DIAGNOSIS — Z9012 Acquired absence of left breast and nipple: Secondary | ICD-10-CM | POA: Diagnosis not present

## 2016-11-04 DIAGNOSIS — R41841 Cognitive communication deficit: Secondary | ICD-10-CM | POA: Diagnosis not present

## 2016-11-04 DIAGNOSIS — Z853 Personal history of malignant neoplasm of breast: Secondary | ICD-10-CM | POA: Diagnosis not present

## 2016-11-04 DIAGNOSIS — Z7951 Long term (current) use of inhaled steroids: Secondary | ICD-10-CM | POA: Diagnosis not present

## 2016-11-04 DIAGNOSIS — Z7401 Bed confinement status: Secondary | ICD-10-CM | POA: Diagnosis not present

## 2016-11-04 DIAGNOSIS — Z9181 History of falling: Secondary | ICD-10-CM | POA: Diagnosis not present

## 2016-11-04 DIAGNOSIS — I739 Peripheral vascular disease, unspecified: Secondary | ICD-10-CM | POA: Diagnosis not present

## 2016-11-04 LAB — SODIUM
Sodium: 128 mmol/L — ABNORMAL LOW (ref 135–145)
Sodium: 126 mmol/L — ABNORMAL LOW (ref 135–145)

## 2016-11-04 MED ORDER — CLONIDINE HCL 0.1 MG PO TABS: 0.1000 mg | ORAL_TABLET | Freq: Two times a day (BID) | ORAL | 0 refills | Status: DC

## 2016-11-04 MED ORDER — GUAIFENESIN ER 600 MG PO TB12: 600.0000 mg | ORAL_TABLET | Freq: Two times a day (BID) | ORAL | 0 refills | Status: DC

## 2016-11-04 MED ORDER — MIRTAZAPINE 15 MG PO TABS: 7.5000 mg | ORAL_TABLET | Freq: Every day | ORAL | Status: DC

## 2016-11-04 MED ORDER — MIRTAZAPINE 7.5 MG PO TABS: 7.5000 mg | ORAL_TABLET | Freq: Every day | ORAL | 0 refills | Status: DC

## 2016-11-04 NOTE — Care Management Important Message (Signed)
Important Message  Patient Details  Name: NORAA SCHAUMANN MRN: [id number] Date of Birth: 06-22-1928   Medicare Important Message Given:  Yes    Katrina Stack, RN 11/04/2016, 2:28 PM

## 2016-11-04 NOTE — Clinical Social Work Note (Signed)
Clinical Social Work Assessment  Patient Details  Name: Melanie Huffman MRN: [id number] Date of Birth: 1928-04-11  Date of referral:  11/04/16               Reason for consult:  Facility Placement                Permission sought to share information with:  Facility Sport and exercise psychologist, Family Supports Permission granted to share information::  Yes, Verbal Permission Granted  Name::     Gauri, Chew Daughter [phone]   Agency::  SNF admissions  Relationship::     Contact Information:     Housing/Transportation Living arrangements for the past 2 months:  [place] of Information:  Patient, Adult Children Patient Interpreter Needed:  None Criminal Activity/Legal Involvement Pertinent to Current Situation/Hospitalization:  No - Comment as needed Significant Relationships:  Adult Children Lives with:  Self Do you feel safe going back to the place where you live?  No Need for family participation in patient care:  No (Coment)  Care giving concerns:  Patient and daughter feel she needs some short term rehab before she is able to go back home.   Social Worker assessment / plan:  Patient is a 82 year old female who lives alone.  Patient is alert and oriented x4 and able to express her feelings.  Patient states she has been to rehab in the past, MSW reminded patient and her daughter about what to expect at SNF and what the process is for going to SNF for short term rehab.  Patient and daughter were explained how insurance will pay for her stay and what to expect with SNF placement process.  Patient and family did not express any other questions or concerns and gave MSW permission to fax out to [facility].  Employment status:  Retired Nurse, adult PT Recommendations:  [place] / Referral to community resources:  [place]  Patient/Family's Response to care:  Patietn and family  agreeable to going to SNF for short term rehab.  Patient/Family's Understanding of and Emotional Response to Diagnosis, Current Treatment, and Prognosis:  Patient expressed she would rather go back home, but is hopeful she will not have to be at [facility] for very long.  Emotional Assessment Appearance:  Appears stated age Attitude/Demeanor/Rapport:    Affect (typically observed):  Appropriate, Calm, Stable Orientation:  Oriented to Self, Oriented to Place, Oriented to  Time, Oriented to Situation Alcohol / Substance use:  Not Applicable Psych involvement (Current and /or in the community):  No (Comment)  Discharge Needs  Concerns to be addressed:  Lack of Support Readmission within the last 30 days:  No Current discharge risk:  Lives alone Barriers to Discharge:  No Barriers Identified   Ross Ludwig 11/04/2016, 5:21 PM

## 2016-11-04 NOTE — Clinical Social Work Placement (Signed)
   CLINICAL SOCIAL WORK PLACEMENT  NOTE  Date:  11/04/2016  Patient Details  Name: Melanie Huffman MRN: [id number] Date of Birth: 02-24-1928  Clinical Social Work is seeking post-discharge placement for this patient at the [place] level of care (*CSW will initial, date and re-position this form in  chart as items are completed):  Yes   Patient/family provided with [place] Work Department's list of facilities offering this level of care within the geographic area requested by the patient (or if unable, by the patient's family).  Yes   Patient/family informed of their freedom to choose among providers that offer the needed level of care, that participate in Medicare, Medicaid or managed care program needed by the patient, have an available bed and are willing to accept the patient.  Yes   Patient/family informed of [place]'s ownership interest in [facility] and [facility], as well as of the fact that they are under no obligation to receive care at these facilities.  PASRR submitted to EDS on 11/04/16     PASRR number received on 11/04/16     Existing PASRR number confirmed on       FL2 transmitted to all facilities in geographic area requested by pt/family on 11/04/16     FL2 transmitted to all facilities within larger geographic area on       Patient informed that his/her managed care company has contracts with or will negotiate with certain facilities, including the following:        Yes   Patient/family informed of bed offers received.  Patient chooses bed at [facility]     Physician recommends and patient chooses bed at      Patient to be transferred to [facility] on 11/04/16.  Patient to be transferred to facility by [facility]     Patient family notified on 11/04/16 of transfer.  Name of family member notified:  [place] daughter was at bedside.     PHYSICIAN       Additional Comment:     _______________________________________________ Ross Ludwig 11/04/2016, 5:31 PM

## 2016-11-04 NOTE — Progress Notes (Addendum)
Physical Therapy Treatment Patient Details Name: Melanie Huffman MRN: [id number] DOB: [birth year] Today's Date: 11/04/2016    History of Present Illness 82 yo female with onset of SOB and low Na+ was admitted, repleted and will be expecting home alone.  PMHx:  SOB, COPD, HTN, smoker, breast CA, asthma, SDH traumatic    PT Comments    Pt is up to walk with PT using IV pole, a bit confused about how to navigate in her room and how to determine assistance needed for trip to [place].  Pt is expected to go to inpt treatment and her thought is she is going home.  Will continue on with therapy as ordered to increase her safety and continue to prepare for snf transition, then home.  Follow Up Recommendations  SNF     Equipment Recommendations  Rolling walker with 5" wheels    Recommendations for Other Services       Precautions / Restrictions Precautions Precautions: Fall Precaution Comments: limited self awareness  Restrictions Weight Bearing Restrictions: No    Mobility  Bed Mobility Overal bed mobility: Needs Assistance Bed Mobility: Supine to Sit     Supine to sit: Min guard;Min assist;HOB elevated     General bed mobility comments: Pt is up to bedside with help to slide to EOB  Transfers Overall transfer level: Needs assistance Equipment used: 1 person hand held assist (IV pole) Transfers: Sit to/from [employer] Transfers Sit to Stand: Min assist         General transfer comment: decreased safety awareness   Ambulation/Gait Ambulation/Gait assistance: Min assist Ambulation Distance (Feet): 45 Feet Assistive device: 1 person hand held assist (IV pole) Gait Pattern/deviations: Step-through pattern;Decreased stride length;Trunk flexed Gait velocity: reduced Gait velocity interpretation: Below normal speed for age/gender     Stairs            Wheelchair Mobility    Modified Rankin (Stroke Patients Only)       Balance                                     Cognition Arousal/Alertness: Awake/alert Behavior During Therapy: Flat affect Overall Cognitive Status: Within Functional Limits for tasks assessed       Memory: Decreased recall of precautions              Exercises General Exercises - Lower Extremity Ankle Circles/Pumps: AROM;10 reps;Seated Quad Sets: AROM;10 reps;Seated Hip ABduction/ADduction: AROM;Both;10 reps    General Comments General comments (skin integrity, edema, etc.): Pt is trying to manage trip to [place] without help but talked with her about safety      Pertinent Vitals/Pain Pain Assessment: No/denies pain    Home Living                      Prior Function            PT Goals (current goals can now be found in the care plan section) Acute Rehab PT Goals Patient Stated Goal: to get home when able PT Goal Formulation: With patient Progress towards PT goals: Progressing toward goals    Frequency    Min 2X/week      PT Plan Current plan remains appropriate    Co-evaluation             End of Session Equipment Utilized During Treatment: Gait belt;Oxygen Activity Tolerance: Patient tolerated treatment well;Patient limited by  fatigue Patient left: in chair;with call bell/phone within reach;with chair alarm set     Time: [phone] PT Time Calculation (min) (ACUTE ONLY): 26 min  Charges:  $Gait Training: 8-22 mins $Therapeutic Exercise: 8-22 mins                    G Codes:      Ramond Dial November 16, 2016, 12:31 PM   Mee Hives, PT MS Acute Rehab Dept. Number: [place] and [place]

## 2016-11-04 NOTE — NC FL2 (Signed)
[place] LEVEL OF CARE SCREENING TOOL     IDENTIFICATION  Patient Name: Melanie Huffman Birthdate: 28-Jun-1928 Sex: female Admission Date (Current Location): 10/30/2016  [place] and [place] Number:  Engineering geologist and Address:  [facility], [address] [place], [place] [address]      Provider Number: [id number]  Attending Physician Name and Address:  Fritzi Mandes, MD  Relative Name and Phone Number:  [facility] Daughter [phone] or Francene Boyers [id number]  [phone]     Current Level of Care: Hospital Recommended Level of Care: [place] Prior Approval Number:    Date Approved/Denied:   PASRR Number: [id number]  Discharge Plan: SNF    Current Diagnoses: Patient Active Problem List   Diagnosis Date Noted  . Uncontrolled hypertension 10/31/2016  . PVD (peripheral vascular disease) ([place]) 10/31/2016  . COPD (chronic obstructive pulmonary disease) ([place]) 10/31/2016  . GERD (gastroesophageal reflux disease) 10/31/2016  . Hyponatremia 07/10/2016  . Dehydration 07/10/2016  . Acute renal insufficiency 07/10/2016  . Hypokalemia 07/10/2016  . Cellulitis of leg, left 07/09/2016  . Patellar fracture 04/11/2015  . Subdural hematoma (HCC) 07/26/2014    Orientation RESPIRATION BLADDER Height & Weight     Self, Time, Situation, Place  O2 Continent Weight: 112 lb 6.4 oz (51 kg) Height:  5\' 4"  (162.6 cm)  BEHAVIORAL SYMPTOMS/MOOD NEUROLOGICAL BOWEL NUTRITION STATUS      Continent Diet (Cardiac Diet)  AMBULATORY STATUS COMMUNICATION OF NEEDS Skin   Limited Assist Verbally Normal                       Personal Care Assistance Level of Assistance  Bathing, Feeding, Dressing Bathing Assistance: Limited assistance Feeding assistance: Independent Dressing Assistance: Limited assistance     Functional Limitations Info  Sight, Speech, Hearing Sight Info: Adequate Hearing Info:  Adequate Speech Info: Adequate    SPECIAL CARE FACTORS FREQUENCY                       Contractures Contractures Info: Not present    Additional Factors Info  Psychotropic, Allergies   Allergies Info: AMLODIPINE, CODEINE, SULFA ANTIBIOTICS  Psychotropic Info: citalopram (CELEXA) tablet 10 mg         Current Medications (11/04/2016):  This is the current hospital active medication list Current Facility-Administered Medications  Medication Dose Route Frequency Provider Last Rate Last Dose  . acetaminophen (TYLENOL) tablet 650 mg  650 mg Oral Q6H PRN Lance Coon, MD       Or  . acetaminophen (TYLENOL) suppository 650 mg  650 mg Rectal Q6H PRN Lance Coon, MD      . albuterol (PROVENTIL) (2.5 MG/3ML) 0.083% nebulizer solution 2.5 mg  2.5 mg Nebulization Q6H PRN Fritzi Mandes, MD   2.5 mg at 11/04/16 1106  . aspirin EC tablet 81 mg  81 mg Oral Daily Lance Coon, MD   81 mg at 11/04/16 [id number]  . carvedilol (COREG) tablet 25 mg  25 mg Oral BID WC Lance Coon, MD   25 mg at 11/04/16 [id number]  . citalopram (CELEXA) tablet 10 mg  10 mg Oral Daily Fritzi Mandes, MD   10 mg at 11/04/16 0811  . cloNIDine (CATAPRES) tablet 0.1 mg  0.1 mg Oral TID Lance Coon, MD   0.1 mg at 11/04/16 [id number]  . guaiFENesin (MUCINEX) 12 hr tablet 600 mg  600 mg Oral BID Lance Coon, MD   600 mg at 11/04/16  0812   And  . dextromethorphan (DELSYM) 30 MG/5ML liquid 30 mg  30 mg Oral BID Lance Coon, MD   30 mg at 11/04/16 0813  . docusate sodium (COLACE) capsule 100 mg  100 mg Oral Daily Fritzi Mandes, MD   100 mg at 11/04/16 0810  . enoxaparin (LOVENOX) injection 40 mg  40 mg Subcutaneous Q24H Lance Coon, MD   40 mg at 11/03/16 2205  . labetalol (NORMODYNE,TRANDATE) injection 10 mg  10 mg Intravenous Q2H PRN Lance Coon, MD      . loratadine (CLARITIN) tablet 10 mg  10 mg Oral Daily Lance Coon, MD   10 mg at 11/04/16 [id number]  . mometasone-formoterol (DULERA) 200-5 MCG/ACT inhaler 2 puff  2 puff Inhalation BID Lance Coon, MD   2 puff at 11/04/16 0809  . ondansetron (ZOFRAN) tablet 4 mg  4 mg Oral Q6H PRN Lance Coon, MD       Or  . ondansetron [facility]) injection 4 mg  4 mg Intravenous Q6H PRN Lance Coon, MD      . pantoprazole (PROTONIX) EC tablet 40 mg  40 mg Oral Daily Lance Coon, MD   40 mg at 11/04/16 [id number]  . polyethylene glycol (MIRALAX / GLYCOLAX) packet 17 g  17 g Oral Daily Fritzi Mandes, MD   17 g at 11/04/16 0815  . potassium chloride SA (K-DUR,KLOR-CON) CR tablet 20 mEq  20 mEq Oral BID Max Sane, MD   20 mEq at 11/04/16 [id number]  . raloxifene (EVISTA) tablet 60 mg  60 mg Oral Daily Lance Coon, MD   60 mg at 11/04/16 [id number]  . ramipril (ALTACE) capsule 20 mg  20 mg Oral Daily Lance Coon, MD   20 mg at 11/04/16 [id number]  . sodium chloride flush (NS) 0.9 % injection 3 mL  3 mL Intravenous Q12H Lance Coon, MD   3 mL at 11/04/16 0815  . theophylline (THEO-24) 24 hr capsule 100 mg  100 mg Oral BID Lance Coon, MD   100 mg at 11/03/16 2208  . tiotropium (SPIRIVA) inhalation capsule 18 mcg  18 mcg Inhalation Daily Lance Coon, MD   18 mcg at 11/04/16 0810  . zolpidem (AMBIEN) tablet 5 mg  5 mg Oral QHS PRN Max Sane, MD   5 mg at 11/03/16 2204     Discharge Medications: Please see discharge summary for a list of discharge medications.  Relevant Imaging Results:  Relevant Lab Results:   Additional Information SSN [id number]  Ross Ludwig

## 2016-11-04 NOTE — Discharge Summary (Signed)
[facility] [place] NAME: Melanie Huffman    MR#:  [id number]  DATE OF BIRTH:  [birth year]  DATE OF ADMISSION:  10/30/2016 ADMITTING PHYSICIAN: Lance Coon, MD  DATE OF DISCHARGE: 11/04/16  PRIMARY CARE PHYSICIAN: Dion Body, MD    ADMISSION DIAGNOSIS:  Hyponatremia [E87.1] Secondary hypertension [I15.9]  DISCHARGE DIAGNOSIS:  Acute hyponatremia due to SIADH  COPD flare/mild interstitial edema-improved gen weakness SECONDARY DIAGNOSIS:   Past Medical History:  Diagnosis Date  . Asthma   . Breast cancer ([place]) 1991   mastectomy left  . Cancer ([place]) 1991   L Breast  . COPD (chronic obstructive pulmonary disease) ([place])   . Heart murmur   . Hypertension   . Peripheral vascular disease ([place])   . Shortness of breath dyspnea   . Traumatic subdural hematoma ([place])     HOSPITAL COURSE:  82 y.o.femalewho presents with an episode of acute shortness of breath. Patient has had significantly increased blood pressure in the recent past which had been difficult to control , Requiring ED and urgent care visits  * Hyponatremia - Strong suspicion for hyponatremia being due to thiazide use and recent increase in her dosage, although she is somewhat confused about which medication she is taking and exact dosing. (I have a feeling she has been taking lot more hydrochlorothiazide than Hydralazine as she indicated she was taking 3 times a day, HCTZ).  -118---121---124-124--118---119---hypertonic saline---123---128--126 -nephrology consult appreciated--->appears SIADH   -stop paxil, start remeron since celexa making sleepy -CT chest shows emphysema and 1 cm left UL mass. Will have pt see oncology as out pt  * Hypokalemia/hypomagnesemia - repleted - could be due to diuretic use and dehydration  *Hypertension - continue home meds.  much better control  * COPD (chronic obstructive pulmonary disease) ([facility]) - continue home meds and  inhalers, she was hypoxic, so has been placed on 2 L oxygen  *GERD (gastroesophageal reflux disease) - home dose PPI  PT recommends rehab Pt will benefit from rehab.  D/w dter  CONSULTS OBTAINED:  Treatment Team:  Anthonette Legato, MD  DRUG ALLERGIES:   Allergies  Allergen Reactions  . Amlodipine Swelling     [Onset: 08/26/2013]  . Codeine Nausea Only  . Sulfa Antibiotics Rash and Nausea Only    DISCHARGE MEDICATIONS:   Current Discharge Medication List    START taking these medications   Details  guaiFENesin (MUCINEX) 600 MG 12 hr tablet Take 1 tablet (600 mg total) by mouth 2 (two) times daily. Qty: 14 tablet, Refills: 0    mirtazapine (REMERON) 7.5 MG tablet Take 1 tablet (7.5 mg total) by mouth at bedtime. Qty: 30 tablet, Refills: 0      CONTINUE these medications which have CHANGED   Details  cloNIDine (CATAPRES) 0.1 MG tablet Take 1 tablet (0.1 mg total) by mouth 2 (two) times daily. Qty: 30 tablet, Refills: 0      CONTINUE these medications which have NOT CHANGED   Details  acetaminophen (TYLENOL) 325 MG tablet Take 2 tablets (650 mg total) by mouth every 6 (six) hours as needed for mild pain (or Fever >/= 101). Qty: 30 tablet, Refills: 0    Ascorbic Acid (VITAMIN C PO) Take 1 capsule by mouth daily.    aspirin EC 81 MG tablet Take 81 mg by mouth daily.    Calcium Carbonate-Vitamin D (CALCIUM + D PO) Take 1 tablet by mouth 2 (two) times daily.    carvedilol (COREG)  25 MG tablet Take 25 mg by mouth 2 (two) times daily with a meal.    Fluticasone-Salmeterol (ADVAIR DISKUS) 250-50 MCG/DOSE AEPB Inhale 1 puff into the lungs 2 (two) times daily.    Multiple Vitamins-Minerals (CENTRUM SILVER PO) Take 1 tablet by mouth daily.    omeprazole (PRILOSEC) 40 MG capsule Take 40 mg by mouth daily.     potassium chloride SA (K-DUR,KLOR-CON) 20 MEQ tablet Take 20 mEq by mouth 2 (two) times daily.     raloxifene (EVISTA) 60 MG tablet Take 60 mg by mouth daily.     ramipril (ALTACE) 10 MG capsule Take 20 mg by mouth daily.     tiotropium (SPIRIVA) 18 MCG inhalation capsule Place 18 mcg into inhaler and inhale daily.         If you experience worsening of your admission symptoms, develop shortness of breath, life threatening emergency, suicidal or homicidal thoughts you must seek medical attention immediately by calling 911 or calling your MD immediately  if symptoms less severe.  You Must read complete instructions/literature along with all the possible adverse reactions/side effects for all the Medicines you take and that have been prescribed to you. Take any new Medicines after you have completely understood and accept all the possible adverse reactions/side effects.   Please note  You were cared for by a hospitalist during your hospital stay. If you have any questions about your discharge medications or the care you received while you were in the hospital after you are discharged, you can call the unit and asked to speak with the hospitalist on call if the hospitalist that took care of you is not available. Once you are discharged, your primary care physician will handle any further medical issues. Please note that NO REFILLS for any discharge medications will be authorized once you are discharged, as it is imperative that you return to your primary care physician (or establish a relationship with a primary care physician if you do not have one) for your aftercare needs so that they can reassess your need for medications and monitor your lab values. Today   SUBJECTIVE   Sleepy earlier. Feeling better now  VITAL SIGNS:  Blood pressure 116/63, pulse (!) 57, temperature 98 F (36.7 C), temperature source Oral, resp. rate 16, height 5\' 4"  (1.626 m), weight 51 kg (112 lb 6.4 oz), SpO2 96 %.  I/O:   Intake/Output Summary (Last 24 hours) at 11/04/16 1435 Last data filed at 11/04/16 1340  Gross per 24 hour  Intake             1180 ml  Output               450 ml  Net              730 ml    PHYSICAL EXAMINATION:  GENERAL:  82 y.o.-year-old patient lying in the bed with no acute distress. Chronically ill EYES: Pupils equal, round, reactive to light and accommodation. No scleral icterus. Extraocular muscles intact.  HEENT: Head atraumatic, normocephalic. Oropharynx and nasopharynx clear.  NECK:  Supple, no jugular venous distention. No thyroid enlargement, no tenderness.  LUNGS: distant breath sounds bilaterally, no wheezing, rales,rhonchi or crepitation. No use of accessory muscles of respiration.  CARDIOVASCULAR: S1, S2 normal. No murmurs, rubs, or gallops.  ABDOMEN: Soft, non-tender, non-distended. Bowel sounds present. No organomegaly or mass.  EXTREMITIES: No pedal edema, cyanosis, or clubbing.  NEUROLOGIC: Cranial nerves II through XII are intact. Muscle strength 5/5 in  all extremities. Sensation intact. Gait not checked.  PSYCHIATRIC: The patient is alert and oriented x 3.  SKIN: No obvious rash, lesion, or ulcer. brusies  DATA REVIEW:   CBC   Recent Labs Lab 11/02/16 1027  WBC 5.9  HGB 12.0  HCT 34.5*  PLT 117*    Chemistries   Recent Labs Lab 11/01/16 0454  11/02/16 1027  11/04/16 0756  NA 124*  < > 120*  < > 126*  K 4.2  --  4.3  --   --   CL 96*  --  90*  --   --   CO2 25  --  25  --   --   GLUCOSE 99  --  142*  --   --   BUN 9  --  10  --   --   CREATININE 0.53  --  0.65  --   --   CALCIUM 7.6*  --  7.9*  --   --   MG 2.1  --   --   --   --   < > = values in this interval not displayed.  Microbiology Results   No results found for this or any previous visit (from the past 240 hour(s)).  RADIOLOGY:  Dg Chest Port 1 View  Result Date: 11/02/2016 CLINICAL DATA:  Confirm line placement EXAM: PORTABLE CHEST 1 VIEW COMPARISON:  Chest CT from earlier today FINDINGS: Right upper extremity PICC with tip at the SVC level. Otherwise no change in the chest from CT earlier today. Hyperinflation and generalized  interstitial opacities. Chronic cardiomegaly. Stable mediastinal contours. IMPRESSION: New right upper extremity PICC with tip at the SVC. Electronically Signed   By: Monte Fantasia M.D.   On: 11/02/2016 17:25     Management plans discussed with the patient, family and they are in agreement.  CODE STATUS:     Code Status Orders        Start     Ordered   10/31/16 0237  Full code  Continuous     10/31/16 0236    Code Status History    Date Active Date Inactive Code Status Order ID Comments User Context   07/09/2016  3:58 PM 07/10/2016  7:14 PM Full Code [id number]  Henreitta Leber, MD Inpatient   04/12/2015  7:49 PM 04/16/2015  6:18 PM Full Code [id number]  Hessie Knows, MD Inpatient   04/11/2015 12:52 PM 04/12/2015  7:49 PM Full Code [id number]  Hessie Knows, MD Inpatient   07/26/2014  4:45 AM 07/26/2014  7:34 PM Full Code [id number]  Ashok Pall, MD Inpatient    Advance Directive Documentation   Flowsheet Row Most Recent Value  Type of Advance Directive  Healthcare Power of Attorney, Living will  Pre-existing out of facility DNR order (yellow form or pink MOST form)  No data  "MOST" Form in Place?  No data      TOTAL TIME TAKING CARE OF THIS PATIENT: 40 minutes.    Kamika Goodloe M.D on 11/04/2016 at 2:35 PM  Between 7am to 6pm - Pager - [phone] After 6pm go to [email or web address] - password [email or web address] [facility]  Office  [phone]  CC: Primary care physician; Dion Body, MD

## 2016-11-04 NOTE — Progress Notes (Signed)
IVs and tele removed. Discharge instructions placed in patient packet. Report called to [facility] place and EMS will be transporting patient to facility.

## 2016-11-04 NOTE — Clinical Social Work Note (Addendum)
Patient needs short term rehab, MSW faxed patients information to SNFs awaiting bed offers.  MSW presented bed offers to patient and her daughter and they chose [facility].  MSW contacted [facility] who said they can accept patient today.  MSW contacted insurance company and received authorization number [id number].  Patient to be d/c'ed today to [facility].  Patient and family agreeable to plans will transport via ems RN to call report to [facility]. Norval Morton, MSW [phone]  Mon-Fri 8a-4:30p 11/04/2016 12:43 PM

## 2016-11-04 NOTE — Progress Notes (Signed)
[facility]  ROUNDING NOTE   Subjective:   Daughter at bedside.  Na 126  Objective:  Vital signs in last 24 hours:  Temp:  [98 F (36.7 C)-98.3 F (36.8 C)] 98 F (36.7 C) (12/05 1206) Pulse Rate:  [54-69] 57 (12/05 1206) Resp:  [16-18] 16 (12/05 1206) BP: (112-159)/(47-71) 116/63 (12/05 1206) SpO2:  [94 %-99 %] 96 % (12/05 1206)  Weight change:  Filed Weights   10/30/16 1628 10/31/16 0236  Weight: 53.1 kg (117 lb) 51 kg (112 lb 6.4 oz)    Intake/Output: I/O last 3 completed shifts: In: 1040.8 [P.O.:940; I.V.:100.8] Out: 1300 [Urine:1250; Stool:50]   Intake/Output this shift:  Total I/O In: 360 [P.O.:360] Out: -   Physical Exam: General: NAD, laying in bed  Head: Normocephalic, atraumatic. Moist oral mucosal membranes  Eyes: Anicteric, PERRL  Neck: Supple, trachea midline  Lungs:  Clear to auscultation  Heart: Regular rate and rhythm  Abdomen:  Soft, nontender,   Extremities: no peripheral edema.  Neurologic: Nonfocal, moving all four extremities  Skin: No lesions       Basic Metabolic Panel:  Recent Labs Lab 10/30/16 1631 10/30/16 2148 10/31/16 0401 11/01/16 0454  11/02/16 1027  11/03/16 0930 11/03/16 1521 11/03/16 2127 11/04/16 0112 11/04/16 0756  NA 118* 121* 124* 124*  < > 120*  < > 123* 122* 124* 128* 126*  K 3.9 3.4* 2.9* 4.2  --  4.3  --   --   --   --   --   --   CL 86* 88* 89* 96*  --  90*  --   --   --   --   --   --   CO2 22 26 25 25   --  25  --   --   --   --   --   --   GLUCOSE 128* 118* 124* 99  --  142*  --   --   --   --   --   --   BUN 9 7 5* 9  --  10  --   --   --   --   --   --   CREATININE 0.48 0.49 0.47 0.53  --  0.65  --   --   --   --   --   --   CALCIUM 8.4* 8.1* 8.4* 7.6*  --  7.9*  --   --   --   --   --   --   MG  --   --  1.5* 2.1  --   --   --   --   --   --   --   --   < > = values in this interval not displayed.  Liver Function Tests: No results for input(s): AST, ALT, ALKPHOS, BILITOT, PROT,  ALBUMIN in the last 168 hours. No results for input(s): LIPASE, AMYLASE in the last 168 hours. No results for input(s): AMMONIA in the last 168 hours.  CBC:  Recent Labs Lab 10/30/16 1631 10/31/16 0401 11/01/16 0454 11/02/16 1027  WBC 4.0 6.2 4.1 5.9  HGB 13.6 14.4 12.0 12.0  HCT 39.6 41.5 34.3* 34.5*  MCV 88.8 88.0 88.1 88.4  PLT 127* 129* 95* 117*    Cardiac Enzymes: No results for input(s): CKTOTAL, CKMB, CKMBINDEX, TROPONINI in the last 168 hours.  BNP: Invalid input(s): POCBNP  CBG: No results for input(s): GLUCAP in the last 168 hours.  Microbiology: Results for  orders placed or performed during the hospital encounter of 07/09/16  MRSA PCR Screening     Status: None   Collection Time: 07/10/16  7:45 AM  Result Value Ref Range Status   MRSA by PCR NEGATIVE NEGATIVE Final    Comment:        The GeneXpert MRSA Assay (FDA approved for NASAL specimens only), is one component of a comprehensive MRSA colonization surveillance program. It is not intended to diagnose MRSA infection nor to guide or monitor treatment for MRSA infections.     Coagulation Studies: No results for input(s): LABPROT, INR in the last 72 hours.  Urinalysis: No results for input(s): COLORURINE, LABSPEC, PHURINE, GLUCOSEU, HGBUR, BILIRUBINUR, KETONESUR, PROTEINUR, UROBILINOGEN, NITRITE, LEUKOCYTESUR in the last 72 hours.  Invalid input(s): APPERANCEUR    Imaging: Ct Chest Wo Contrast  Result Date: 11/02/2016 CLINICAL DATA:  PMHx of Asthma, left breast cancer status post mastectomy, COPD, hypertension, peripheral vascular disease, history of subdural hematoma, who was admitted to [facility] on 10/30/2016 for evaluation of shortness of breath. Patient had been seen by her primary care physician recently for upper respiratory infection. The patient has been having progressive shortness of breath. We are now asked evaluate her for worsening hyponatremia--per MD note EXAM: CT CHEST WITHOUT CONTRAST  TECHNIQUE: Multidetector CT imaging of the chest was performed following the standard protocol without IV contrast. COMPARISON:  Chest radiograph, 11/01/2016. FINDINGS: Cardiovascular: Heart is mildly enlarged. There are mild coronary artery calcifications. The aorta is normal in caliber. Atherosclerotic calcifications are noted along the thoracic aorta and at the origin of the branch vessels. There is mild enlargement of the right and left pulmonary arteries both measuring 2.8 cm. Mediastinum/Nodes: No mediastinal or hilar masses. No discrete enlarged lymph nodes. Mild dilation of the esophagus. Trachea is unremarkable. Lungs/Pleura: Small bilateral pleural effusions. Advanced changes of centrilobular emphysema. There is bronchial wall thickening most evident in the lower lobes, seen to a lesser degree in the upper lobes and right middle lobe. Mild lower lung zone interstitial thickening. There is an irregular focal area of opacity in the posterior left upper lobe adjacent to the oblique fissure, centered on image 62, series 3, measuring an average of 1 cm in size. In the posterior, dependent lower lobes, there is hazy and reticular type opacities, which is likely atelectasis. Right greater than left apical pleural parenchymal scarring is noted. No pneumothorax. Upper Abdomen: No acute abnormality. Musculoskeletal: Old sternal fracture. Bones are demineralized. No osteoblastic or osteolytic lesions. Changes from a left mastectomy.  No chest wall mass. IMPRESSION: 1. Small bilateral pleural effusions, mild dependent lower lobe atelectasis and mild interstitial thickening. Consider mild congestive heart failure superimposed on changes of advanced centrilobular emphysema. 2. There is also bronchial wall thickening that is most prominent in the lower lobes. This may all be chronic. Acute bronchial inflammation/infection should be considered in the proper clinical setting. 3. No convincing pneumonia. 4. 1 cm irregular  nodular opacity in the left upper lobe. Consider one of the following in 3 months for both low-risk and high-risk individuals: (a) repeat chest CT, (b) follow-up PET-CT, or (c) tissue sampling. This recommendation follows the consensus statement: Guidelines for Management of Incidental Pulmonary Nodules Detected on CT Images: From the Fleischner Society 2017; Radiology 2017; 284:228-243. Since this may be atelectasis or inflammatory in origin, follow-up chest CT in 3 months is favored. 5. Mild enlargement of the pulmonary arteries consistent with pulmonary hypertension. Electronically Signed   By: Dedra Skeens.D.  On: 11/02/2016 15:08   Dg Chest Port 1 View  Result Date: 11/02/2016 CLINICAL DATA:  Confirm line placement EXAM: PORTABLE CHEST 1 VIEW COMPARISON:  Chest CT from earlier today FINDINGS: Right upper extremity PICC with tip at the SVC level. Otherwise no change in the chest from CT earlier today. Hyperinflation and generalized interstitial opacities. Chronic cardiomegaly. Stable mediastinal contours. IMPRESSION: New right upper extremity PICC with tip at the SVC. Electronically Signed   By: Monte Fantasia M.D.   On: 11/02/2016 17:25     Medications:    . aspirin EC  81 mg Oral Daily  . carvedilol  25 mg Oral BID WC  . citalopram  10 mg Oral Daily  . cloNIDine  0.1 mg Oral TID  . guaiFENesin  600 mg Oral BID   And  . dextromethorphan  30 mg Oral BID  . docusate sodium  100 mg Oral Daily  . enoxaparin (LOVENOX) injection  40 mg Subcutaneous Q24H  . loratadine  10 mg Oral Daily  . mometasone-formoterol  2 puff Inhalation BID  . pantoprazole  40 mg Oral Daily  . polyethylene glycol  17 g Oral Daily  . potassium chloride  20 mEq Oral BID  . raloxifene  60 mg Oral Daily  . ramipril  20 mg Oral Daily  . sodium chloride flush  3 mL Intravenous Q12H  . theophylline  100 mg Oral BID  . tiotropium  18 mcg Inhalation Daily   acetaminophen **OR** acetaminophen, albuterol, labetalol,  ondansetron **OR** ondansetron (ZOFRAN) IV, zolpidem  Assessment/ Plan:  Ms. Melanie Huffman is a 82 y.o. white female with COPD/Asthma, left breast cancer status post mastectomy,  hypertension, peripheral vascular disease, history of subdural hematoma, who was admitted to [facility] on 10/30/2016   1. Hyponatremia: started on paxil and may have been taking hydrochlorothiazide instead of hydralazine.  - Discontinue hypertonic salne - Discontinue SSRI, try another class of anti-depressant.  - fluid restriction - will consider salt tabs  2. Hypertension: history of poor control. Currently at goal.  Suspect her blood pressure is anxiety driven.  - carvedilol, clonidine, ramipril.   3. Hypokalemia - potassium chloride.  - magnesium replaced  4. Acute exacerbation of COPD: continue supportive care.    LOS: [place], Leomar Westberg 12/5/201712:13 PM

## 2016-11-05 DIAGNOSIS — F329 Major depressive disorder, single episode, unspecified: Secondary | ICD-10-CM | POA: Diagnosis not present

## 2016-11-05 DIAGNOSIS — I10 Essential (primary) hypertension: Secondary | ICD-10-CM | POA: Diagnosis not present

## 2016-11-05 DIAGNOSIS — E440 Moderate protein-calorie malnutrition: Secondary | ICD-10-CM | POA: Diagnosis not present

## 2016-11-05 DIAGNOSIS — E871 Hypo-osmolality and hyponatremia: Secondary | ICD-10-CM | POA: Diagnosis not present

## 2016-11-05 DIAGNOSIS — J449 Chronic obstructive pulmonary disease, unspecified: Secondary | ICD-10-CM | POA: Diagnosis not present

## 2016-11-05 LAB — CBC
HEMATOCRIT: 31.8 % — AB (ref 35.0–47.0)
HEMOGLOBIN: 11.1 g/dL — AB (ref 12.0–16.0)
MCH: 31 pg (ref 26.0–34.0)
MCHC: 35.1 g/dL (ref 32.0–36.0)
MCV: 88.4 fL (ref 80.0–100.0)
Platelets: 134 10*3/uL — ABNORMAL LOW (ref 150–440)
RBC: 3.6 MIL/uL — AB (ref 3.80–5.20)
RDW: 12.9 % (ref 11.5–14.5)
WBC: 4 10*3/uL (ref 3.6–11.0)

## 2016-11-05 LAB — COMPREHENSIVE METABOLIC PANEL
ALBUMIN: 2.5 g/dL — AB (ref 3.5–5.0)
ALK PHOS: 139 U/L — AB (ref 38–126)
ALT: 82 U/L — AB (ref 14–54)
AST: 73 U/L — ABNORMAL HIGH (ref 15–41)
Anion gap: 4 — ABNORMAL LOW (ref 5–15)
BILIRUBIN TOTAL: 0.5 mg/dL (ref 0.3–1.2)
CALCIUM: 8.6 mg/dL — AB (ref 8.9–10.3)
CO2: 30 mmol/L (ref 22–32)
CREATININE: 0.47 mg/dL (ref 0.44–1.00)
Chloride: 93 mmol/L — ABNORMAL LOW (ref 101–111)
GFR calc Af Amer: >60 mL/min (ref >60)
GFR calc non Af Amer: >60 mL/min (ref >60)
GLUCOSE: 88 mg/dL (ref 65–99)
Potassium: 4.1 mmol/L (ref 3.5–5.1)
SODIUM: 127 mmol/L — AB (ref 135–145)
Total Protein: 5.2 g/dL — ABNORMAL LOW (ref 6.5–8.1)

## 2016-11-05 LAB — VITAMIN B12: Vitamin B-12: 1474 pg/mL — ABNORMAL HIGH (ref 180–914)

## 2016-11-05 NOTE — Progress Notes (Signed)
Patient has been discharged to [facility]. Per Dr. Juleen China, patient should be on a fluid restriction of 1558mL/day.

## 2016-11-06 LAB — VITAMIN D 25 HYDROXY (VIT D DEFICIENCY, FRACTURES): Vit D, 25-Hydroxy: 49.3 ng/mL (ref 30.0–100.0)

## 2016-11-08 DIAGNOSIS — R0902 Hypoxemia: Secondary | ICD-10-CM | POA: Diagnosis not present

## 2016-11-08 DIAGNOSIS — R0989 Other specified symptoms and signs involving the circulatory and respiratory systems: Secondary | ICD-10-CM | POA: Diagnosis not present

## 2016-11-08 DIAGNOSIS — R062 Wheezing: Secondary | ICD-10-CM | POA: Diagnosis not present

## 2016-11-09 DIAGNOSIS — R918 Other nonspecific abnormal finding of lung field: Secondary | ICD-10-CM | POA: Insufficient documentation

## 2016-11-09 NOTE — Progress Notes (Signed)
[place]  [phone] [phone]) [phone]  ID: Melanie Huffman OB: 1928/08/06  MR#: [id number]  [id number]  Patient Care Team: Dion Body, MD as PCP - General (Family Medicine)  CHIEF COMPLAINT: Left upper lobe lung mass.  INTERVAL HISTORY: Patient is an 82 year old female who was recently admitted to the hospital with worsening shortness of breath. Subsequent workup included a CT scan which revealed a left upper lobe lung mass highly suspicious for underlying malignancy. Currently she continues to feel weak and fatigued but this is improved since admission. She also has persistent shortness of breath. She has no neurologic complaints. She denies any recent fevers. She denies any chest pain, cough, or hemoptysis. She has a good appetite and denies weight loss. She has no nausea, vomiting, constipation, or diarrhea. She has no urinary complaints. Patient otherwise feels well and offers no further specific complaints.  REVIEW OF SYSTEMS:   Review of Systems  Constitutional: Positive for malaise/fatigue. Negative for weight loss.  Respiratory: Positive for shortness of breath. Negative for cough and hemoptysis.   Cardiovascular: Negative.  Negative for chest pain and leg swelling.  Gastrointestinal: Negative.  Negative for abdominal pain.  Genitourinary: Negative.   Musculoskeletal: Negative.   Neurological: Positive for weakness.  Psychiatric/Behavioral: Negative.  The patient is not nervous/anxious.     As per HPI. Otherwise, a complete review of systems is negative.  PAST MEDICAL HISTORY: Past Medical History:  Diagnosis Date  . Asthma   . Breast cancer ([place]) 1991   mastectomy left  . Cancer ([place]) 1991   L Breast  . COPD (chronic obstructive pulmonary disease) ([place])   . Heart murmur   . Hypertension   . Peripheral vascular disease ([place])   . Shortness of breath dyspnea   . Traumatic subdural hematoma ([facility])     PAST SURGICAL  HISTORY: Past Surgical History:  Procedure Laterality Date  . ABDOMINAL HYSTERECTOMY     Partial  . APPENDECTOMY    . BREAST BIOPSY Right    1980 and 1970's  . BREAST SURGERY  1991   L Mastectomy  . MASTECTOMY    . ORIF PATELLA Left 04/12/2015   Procedure: OPEN REDUCTION INTERNAL (ORIF) FIXATION PATELLA;  Surgeon: Hessie Knows, MD;  Location: [facility];  Service: Orthopedics;  Laterality: Left;  . PERIPHERAL VASCULAR CATHETERIZATION Left 04/17/2015   Procedure: Lower Extremity Angiography;  Surgeon: Katha Cabal, MD;  Location: [place] [facility];  Service: Cardiovascular;  Laterality: Left;  . PERIPHERAL VASCULAR CATHETERIZATION  04/17/2015   Procedure: Lower Extremity Intervention;  Surgeon: Katha Cabal, MD;  Location: [place] [facility];  Service: Cardiovascular;;  . TONSILLECTOMY      FAMILY HISTORY: Family History  Problem Relation Age of Onset  . CVA Mother   . Kidney disease Mother   . Heart disease Father   . Kidney disease Cousin 64  . Kidney disease Cousin 61  . Breast cancer Neg Hx     ADVANCED DIRECTIVES (Y/N):  N  HEALTH MAINTENANCE: Social History  Substance Use Topics  . Smoking status: Former Smoker    Packs/day: 0.50    Years: 20.00    Types: Cigarettes    Quit date: 01/26/1979  . Smokeless tobacco: Never Used  . Alcohol use No     Colonoscopy:  PAP:  Bone density:  Lipid panel:  Allergies  Allergen Reactions  . Amlodipine Swelling     [Onset: 08/26/2013]  . Codeine Nausea Only  . Sulfa Antibiotics  Rash and Nausea Only    Current Outpatient Prescriptions  Medication Sig Dispense Refill  . acetaminophen (TYLENOL) 325 MG tablet Take 2 tablets (650 mg total) by mouth every 6 (six) hours as needed for mild pain (or Fever >/= 101). 30 tablet 0  . Ascorbic Acid (VITAMIN C PO) Take 1 capsule by mouth daily.    Marland Kitchen aspirin EC 81 MG tablet Take 81 mg by mouth daily.    . Calcium Carbonate-Vitamin D (CALCIUM + D PO) Take 1 tablet by mouth 2  (two) times daily.    . carvedilol (COREG) 25 MG tablet Take 25 mg by mouth 2 (two) times daily with a meal.    . cloNIDine (CATAPRES) 0.1 MG tablet Take 1 tablet (0.1 mg total) by mouth 2 (two) times daily. 30 tablet 0  . Fluticasone-Salmeterol (ADVAIR DISKUS) 250-50 MCG/DOSE AEPB Inhale 1 puff into the lungs 2 (two) times daily.    Marland Kitchen guaiFENesin (MUCINEX) 600 MG 12 hr tablet Take 1 tablet (600 mg total) by mouth 2 (two) times daily. 14 tablet 0  . mirtazapine (REMERON) 7.5 MG tablet Take 1 tablet (7.5 mg total) by mouth at bedtime. 30 tablet 0  . Multiple Vitamins-Minerals (CENTRUM SILVER PO) Take 1 tablet by mouth daily.    Marland Kitchen omeprazole (PRILOSEC) 40 MG capsule Take 40 mg by mouth daily.     . potassium chloride SA (K-DUR,KLOR-CON) 20 MEQ tablet Take 20 mEq by mouth 2 (two) times daily.     . predniSONE (DELTASONE) 20 MG tablet Take 20 mg by mouth daily with breakfast.    . raloxifene (EVISTA) 60 MG tablet Take 60 mg by mouth daily.    Marland Kitchen tiotropium (SPIRIVA) 18 MCG inhalation capsule Place 18 mcg into inhaler and inhale daily.     . traZODone (DESYREL) 50 MG tablet Take 50 mg by mouth at bedtime as needed for sleep.     No current facility-administered medications for this visit.     OBJECTIVE: Vitals:   11/10/16 1557  BP: (!) 151/98  Pulse: 67  Resp: 18  Temp: 98.6 F (37 C)     Body mass index is 20.25 kg/m.    ECOG FS:1 - Symptomatic but completely ambulatory  General: Well-developed, well-nourished, no acute distress. Eyes: Pink conjunctiva, anicteric sclera. HEENT: Normocephalic, moist mucous membranes, clear oropharnyx. Lungs: scattered wheezing throughout. Heart: Regular rate and rhythm. No rubs, murmurs, or gallops. Abdomen: Soft, nontender, nondistended. No organomegaly noted, normoactive bowel sounds. Musculoskeletal: No edema, cyanosis, or clubbing. Neuro: Alert, answering all questions appropriately. Cranial nerves grossly intact. Skin: No rashes or petechiae  noted. Psych: Normal affect. Lymphatics: No cervical, calvicular, axillary or inguinal LAD.   LAB RESULTS:  Lab Results  Component Value Date   NA 130 (L) 11/11/2016   K 3.9 11/11/2016   CL 95 (L) 11/11/2016   CO2 30 11/11/2016   GLUCOSE 80 11/11/2016   BUN 20 11/11/2016   CREATININE 0.52 11/11/2016   CALCIUM 8.5 (L) 11/11/2016   PROT 5.2 (L) 11/05/2016   ALBUMIN 2.5 (L) 11/05/2016   AST 73 (H) 11/05/2016   ALT 82 (H) 11/05/2016   ALKPHOS 139 (H) 11/05/2016   BILITOT 0.5 11/05/2016   GFRNONAA >60 11/11/2016   GFRAA >60 11/11/2016    Lab Results  Component Value Date   WBC 4.0 11/05/2016   NEUTROABS 16.1 (H) 07/01/2016   HGB 11.1 (L) 11/05/2016   HCT 31.8 (L) 11/05/2016   MCV 88.4 11/05/2016   PLT 134 (L)  11/05/2016     STUDIES: Dg Chest 2 View  Result Date: 10/30/2016 CLINICAL DATA:  Hypertension. EXAM: CHEST  2 VIEW COMPARISON:  10/13/2016. FINDINGS: Mediastinum and hilar structures normal. Cardiomegaly with normal pulmonary vascularity. No focal infiltrate noted. No pleural effusion or pneumothorax. Left mastectomy. No acute or focal bony abnormality. Degenerative changes thoracic spine. IMPRESSION: 1.  No acute cardiopulmonary disease.  Stable cardiomegaly. 2. Left mastectomy . Electronically Signed   By: Marcello Moores  Register   On: 10/30/2016 17:43   Ct Chest Wo Contrast  Result Date: 11/02/2016 CLINICAL DATA:  PMHx of Asthma, left breast cancer status post mastectomy, COPD, hypertension, peripheral vascular disease, history of subdural hematoma, who was admitted to [facility] on 10/30/2016 for evaluation of shortness of breath. Patient had been seen by her primary care physician recently for upper respiratory infection. The patient has been having progressive shortness of breath. We are now asked evaluate her for worsening hyponatremia--per MD note EXAM: CT CHEST WITHOUT CONTRAST TECHNIQUE: Multidetector CT imaging of the chest was performed following the standard protocol  without IV contrast. COMPARISON:  Chest radiograph, 11/01/2016. FINDINGS: Cardiovascular: Heart is mildly enlarged. There are mild coronary artery calcifications. The aorta is normal in caliber. Atherosclerotic calcifications are noted along the thoracic aorta and at the origin of the branch vessels. There is mild enlargement of the right and left pulmonary arteries both measuring 2.8 cm. Mediastinum/Nodes: No mediastinal or hilar masses. No discrete enlarged lymph nodes. Mild dilation of the esophagus. Trachea is unremarkable. Lungs/Pleura: Small bilateral pleural effusions. Advanced changes of centrilobular emphysema. There is bronchial wall thickening most evident in the lower lobes, seen to a lesser degree in the upper lobes and right middle lobe. Mild lower lung zone interstitial thickening. There is an irregular focal area of opacity in the posterior left upper lobe adjacent to the oblique fissure, centered on image 62, series 3, measuring an average of 1 cm in size. In the posterior, dependent lower lobes, there is hazy and reticular type opacities, which is likely atelectasis. Right greater than left apical pleural parenchymal scarring is noted. No pneumothorax. Upper Abdomen: No acute abnormality. Musculoskeletal: Old sternal fracture. Bones are demineralized. No osteoblastic or osteolytic lesions. Changes from a left mastectomy.  No chest wall mass. IMPRESSION: 1. Small bilateral pleural effusions, mild dependent lower lobe atelectasis and mild interstitial thickening. Consider mild congestive heart failure superimposed on changes of advanced centrilobular emphysema. 2. There is also bronchial wall thickening that is most prominent in the lower lobes. This may all be chronic. Acute bronchial inflammation/infection should be considered in the proper clinical setting. 3. No convincing pneumonia. 4. 1 cm irregular nodular opacity in the left upper lobe. Consider one of the following in 3 months for both  low-risk and high-risk individuals: (a) repeat chest CT, (b) follow-up PET-CT, or (c) tissue sampling. This recommendation follows the consensus statement: Guidelines for Management of Incidental Pulmonary Nodules Detected on CT Images: From the Fleischner Society 2017; Radiology 2017; 284:228-243. Since this may be atelectasis or inflammatory in origin, follow-up chest CT in 3 months is favored. 5. Mild enlargement of the pulmonary arteries consistent with pulmonary hypertension. Electronically Signed   By: Lajean Manes M.D.   On: 11/02/2016 15:08   Dg Chest Port 1 View  Result Date: 11/02/2016 CLINICAL DATA:  Confirm line placement EXAM: PORTABLE CHEST 1 VIEW COMPARISON:  Chest CT from earlier today FINDINGS: Right upper extremity PICC with tip at the SVC level. Otherwise no change in the chest from  CT earlier today. Hyperinflation and generalized interstitial opacities. Chronic cardiomegaly. Stable mediastinal contours. IMPRESSION: New right upper extremity PICC with tip at the SVC. Electronically Signed   By: Monte Fantasia M.D.   On: 11/02/2016 17:25   Dg Chest Port 1 View  Result Date: 11/01/2016 CLINICAL DATA:  Worsening shortness of breath. Wheezing. Hypertension. Hyponatremia. EXAM: PORTABLE CHEST 1 VIEW COMPARISON:  10/30/2016. FINDINGS: Stable enlarged cardiac silhouette. Aortic arch calcifications. The lungs remain hyperexpanded with diffusely prominent interstitial markings. Diffuse osteopenia. IMPRESSION: No acute abnormality.  Stable cardiomegaly and changes of COPD. Electronically Signed   By: Claudie Revering M.D.   On: 11/01/2016 18:03   Dg Chest Portable 1 View  Result Date: 10/13/2016 CLINICAL DATA:  Upper abdominal plain over the past 2-3 days. EXAM: PORTABLE CHEST 1 VIEW COMPARISON:  Single-view of the chest 04/12/2015. FINDINGS: The chest is hyperexpanded but the lungs are clear. Heart size is upper normal. No pneumothorax or pleural effusion. No focal bony abnormality. IMPRESSION:  No acute disease. Electronically Signed   By: Inge Rise M.D.   On: 10/13/2016 18:33    ASSESSMENT: Left upper lobe lung mass  PLAN:    1. Left upper lobe lung mass:CT scan results reviewed independently and reported as above with 1 cm nodule highly suspicious for underlying malignancy. After lengthy discussion with the patient, she does not wish to have any interventions at this time and has declined further evaluation with PET scan. Patient states she is too weak to undergo any procedures. It was agreed upon to repeat CT scan in 3 months to assess for interval change.  2. Thrombocytopenia: Mild, monitor. 3. Hypertension:Patient's blood pressure  Monitor.  Approximately 45 minutes was spent in discussion of which greater than 50% was consultation.  Patient expressed understanding and was in agreement with this plan. She also understands that She can call clinic at any time with any questions, concerns, or complaints.   No matching staging information was found for the patient.  Lloyd Huger, MD   11/12/2016 11:23 AM

## 2016-11-10 ENCOUNTER — Inpatient Hospital Stay: Payer: PPO | Attending: Oncology | Admitting: Oncology

## 2016-11-10 VITALS — BP 151/98 | HR 67 | Temp 98.6°F | Resp 18 | Wt 117.9 lb

## 2016-11-10 DIAGNOSIS — E871 Hypo-osmolality and hyponatremia: Secondary | ICD-10-CM | POA: Insufficient documentation

## 2016-11-10 DIAGNOSIS — J449 Chronic obstructive pulmonary disease, unspecified: Secondary | ICD-10-CM

## 2016-11-10 DIAGNOSIS — R0602 Shortness of breath: Secondary | ICD-10-CM | POA: Diagnosis not present

## 2016-11-10 DIAGNOSIS — J90 Pleural effusion, not elsewhere classified: Secondary | ICD-10-CM | POA: Diagnosis not present

## 2016-11-10 DIAGNOSIS — Z87891 Personal history of nicotine dependence: Secondary | ICD-10-CM | POA: Insufficient documentation

## 2016-11-10 DIAGNOSIS — R531 Weakness: Secondary | ICD-10-CM | POA: Diagnosis not present

## 2016-11-10 DIAGNOSIS — Z9012 Acquired absence of left breast and nipple: Secondary | ICD-10-CM | POA: Insufficient documentation

## 2016-11-10 DIAGNOSIS — Z7982 Long term (current) use of aspirin: Secondary | ICD-10-CM | POA: Insufficient documentation

## 2016-11-10 DIAGNOSIS — R5383 Other fatigue: Secondary | ICD-10-CM | POA: Insufficient documentation

## 2016-11-10 DIAGNOSIS — Z853 Personal history of malignant neoplasm of breast: Secondary | ICD-10-CM | POA: Diagnosis not present

## 2016-11-10 DIAGNOSIS — R918 Other nonspecific abnormal finding of lung field: Secondary | ICD-10-CM | POA: Diagnosis not present

## 2016-11-10 DIAGNOSIS — I739 Peripheral vascular disease, unspecified: Secondary | ICD-10-CM | POA: Diagnosis not present

## 2016-11-10 DIAGNOSIS — Z7952 Long term (current) use of systemic steroids: Secondary | ICD-10-CM | POA: Diagnosis not present

## 2016-11-10 DIAGNOSIS — I10 Essential (primary) hypertension: Secondary | ICD-10-CM | POA: Diagnosis not present

## 2016-11-10 DIAGNOSIS — M8580 Other specified disorders of bone density and structure, unspecified site: Secondary | ICD-10-CM | POA: Insufficient documentation

## 2016-11-10 DIAGNOSIS — Z87898 Personal history of other specified conditions: Secondary | ICD-10-CM | POA: Diagnosis not present

## 2016-11-10 DIAGNOSIS — D696 Thrombocytopenia, unspecified: Secondary | ICD-10-CM | POA: Insufficient documentation

## 2016-11-10 DIAGNOSIS — I517 Cardiomegaly: Secondary | ICD-10-CM | POA: Insufficient documentation

## 2016-11-10 DIAGNOSIS — R011 Cardiac murmur, unspecified: Secondary | ICD-10-CM | POA: Diagnosis not present

## 2016-11-10 DIAGNOSIS — Z79899 Other long term (current) drug therapy: Secondary | ICD-10-CM | POA: Insufficient documentation

## 2016-11-10 DIAGNOSIS — Z87820 Personal history of traumatic brain injury: Secondary | ICD-10-CM | POA: Diagnosis not present

## 2016-11-10 NOTE — Progress Notes (Signed)
New evaluation for lung mass. Offers no complaints. 

## 2016-11-11 ENCOUNTER — Non-Acute Institutional Stay (SKILLED_NURSING_FACILITY): Payer: PPO | Admitting: Gerontology

## 2016-11-11 DIAGNOSIS — R911 Solitary pulmonary nodule: Secondary | ICD-10-CM | POA: Diagnosis not present

## 2016-11-11 DIAGNOSIS — J449 Chronic obstructive pulmonary disease, unspecified: Secondary | ICD-10-CM

## 2016-11-11 DIAGNOSIS — E871 Hypo-osmolality and hyponatremia: Secondary | ICD-10-CM | POA: Diagnosis not present

## 2016-11-11 DIAGNOSIS — I10 Essential (primary) hypertension: Secondary | ICD-10-CM | POA: Diagnosis not present

## 2016-11-11 LAB — BASIC METABOLIC PANEL
Anion gap: 5 (ref 5–15)
BUN: 20 mg/dL (ref 6–20)
CALCIUM: 8.5 mg/dL — AB (ref 8.9–10.3)
CO2: 30 mmol/L (ref 22–32)
CREATININE: 0.52 mg/dL (ref 0.44–1.00)
Chloride: 95 mmol/L — ABNORMAL LOW (ref 101–111)
GFR calc Af Amer: >60 mL/min (ref >60)
GFR calc non Af Amer: >60 mL/min (ref >60)
GLUCOSE: 80 mg/dL (ref 65–99)
Potassium: 3.9 mmol/L (ref 3.5–5.1)
Sodium: 130 mmol/L — ABNORMAL LOW (ref 135–145)

## 2016-11-13 DIAGNOSIS — E871 Hypo-osmolality and hyponatremia: Secondary | ICD-10-CM | POA: Diagnosis not present

## 2016-11-13 LAB — BASIC METABOLIC PANEL
Anion gap: 3 — ABNORMAL LOW (ref 5–15)
BUN: 19 mg/dL (ref 6–20)
CHLORIDE: 97 mmol/L — AB (ref 101–111)
CO2: 31 mmol/L (ref 22–32)
CREATININE: 0.68 mg/dL (ref 0.44–1.00)
Calcium: 8.5 mg/dL — ABNORMAL LOW (ref 8.9–10.3)
GFR calc non Af Amer: >60 mL/min (ref >60)
Glucose, Bld: 89 mg/dL (ref 65–99)
Potassium: 3.9 mmol/L (ref 3.5–5.1)
SODIUM: 131 mmol/L — AB (ref 135–145)

## 2016-11-20 ENCOUNTER — Non-Acute Institutional Stay (SKILLED_NURSING_FACILITY): Payer: PPO | Admitting: Gerontology

## 2016-11-20 DIAGNOSIS — E871 Hypo-osmolality and hyponatremia: Secondary | ICD-10-CM | POA: Diagnosis not present

## 2016-11-20 DIAGNOSIS — J449 Chronic obstructive pulmonary disease, unspecified: Secondary | ICD-10-CM

## 2016-11-27 NOTE — Progress Notes (Signed)
Location:      Place of Service:  SNF (31) Provider:  Toni Arthurs, NP-C  Dion Body, MD  Patient Care Team: Dion Body, MD as PCP - General (Family Medicine)  Extended Emergency Contact Information Primary Emergency Contact: Melanie Huffman Address: [address] [place] Phone: [phone] Work Phone: [phone] Relation: Daughter Secondary Emergency Contact: Melanie Huffman [place] Phone: [phone] Mobile Phone: [phone] Relation: Friend  Code Status:  Full Goals of care: Advanced Directive information Advanced Directives 11/10/2016  Does Patient Have a Medical Advance Directive? Yes  Type of Paramedic of [place];Living will  Does patient want to make changes to medical advance directive? -  Copy of [place] in Chart? No - copy requested  Would patient like information on creating a medical advance directive? -     Chief Complaint  Patient presents with  . Acute Visit    HPI:  Pt is a 82 y.o. female seen today for an acute visit for COPD exacerbation. Patient complained of cough with intermittent expectoration of brown sputum. Patient has history of COPD. Afebrile. Patient denies chest pain, shortness of breath. Patient denies having oxygen at home. Patient is anxious to be weaned off of the oxygen to increase her mobility. Vital signs stable. No other complaints.    Past Medical History:  Diagnosis Date  . Asthma   . Breast cancer ([place]) 1991   mastectomy left  . Cancer ([place]) 1991   L Breast  . COPD (chronic obstructive pulmonary disease) ([place])   . Heart murmur   . Hypertension   . Peripheral vascular disease ([place])   . Shortness of breath dyspnea   . Traumatic subdural hematoma [facility])    Past Surgical History:  Procedure Laterality Date  . ABDOMINAL HYSTERECTOMY     Partial  . APPENDECTOMY    . BREAST BIOPSY Right    1980 and  1970's  . BREAST SURGERY  1991   L Mastectomy  . MASTECTOMY    . ORIF PATELLA Left 04/12/2015   Procedure: OPEN REDUCTION INTERNAL (ORIF) FIXATION PATELLA;  Surgeon: Hessie Knows, MD;  Location: [facility] ORS;  Service: Orthopedics;  Laterality: Left;  . PERIPHERAL VASCULAR CATHETERIZATION Left 04/17/2015   Procedure: Lower Extremity Angiography;  Surgeon: Katha Cabal, MD;  Location: [place] [facility];  Service: Cardiovascular;  Laterality: Left;  . PERIPHERAL VASCULAR CATHETERIZATION  04/17/2015   Procedure: Lower Extremity Intervention;  Surgeon: Katha Cabal, MD;  Location: [place] [facility];  Service: Cardiovascular;;  . TONSILLECTOMY      Allergies  Allergen Reactions  . Amlodipine Swelling     [Onset: 08/26/2013]  . Codeine Nausea Only  . Sulfa Antibiotics Rash and Nausea Only    Allergies as of 11/11/2016      Reactions   Amlodipine Swelling    [Onset: 08/26/2013]   Codeine Nausea Only   Sulfa Antibiotics Rash, Nausea Only      Medication List       Accurate as of 11/11/16 11:59 PM. Always use your most recent med list.          acetaminophen 325 MG tablet Commonly known as:  TYLENOL Take 2 tablets (650 mg total) by mouth every 6 (six) hours as needed for mild pain (or Fever >/= 101).   ADVAIR DISKUS 250-50 MCG/DOSE Aepb Generic drug:  Fluticasone-Salmeterol Inhale 1 puff into the lungs 2 (two)  times daily.   aspirin EC 81 MG tablet Take 81 mg by mouth daily.   CALCIUM + D PO Take 1 tablet by mouth 2 (two) times daily.   carvedilol 25 MG tablet Commonly known as:  COREG Take 25 mg by mouth 2 (two) times daily with a meal.   CENTRUM SILVER PO Take 1 tablet by mouth daily.   cloNIDine 0.1 MG tablet Commonly known as:  CATAPRES Take 1 tablet (0.1 mg total) by mouth 2 (two) times daily.   guaiFENesin 600 MG 12 hr tablet Commonly known as:  MUCINEX Take 1 tablet (600 mg total) by mouth 2 (two) times daily.   mirtazapine 7.5 MG  tablet Commonly known as:  REMERON Take 1 tablet (7.5 mg total) by mouth at bedtime.   omeprazole 40 MG capsule Commonly known as:  PRILOSEC Take 40 mg by mouth daily.   potassium chloride SA 20 MEQ tablet Commonly known as:  K-DUR,KLOR-CON Take 20 mEq by mouth 2 (two) times daily.   predniSONE 20 MG tablet Commonly known as:  DELTASONE Take 20 mg by mouth daily with breakfast.   raloxifene 60 MG tablet Commonly known as:  EVISTA Take 60 mg by mouth daily.   tiotropium 18 MCG inhalation capsule Commonly known as:  SPIRIVA Place 18 mcg into inhaler and inhale daily.   traZODone 50 MG tablet Commonly known as:  DESYREL Take 50 mg by mouth at bedtime as needed for sleep.   VITAMIN C PO Take 1 capsule by mouth daily.       Review of Systems  Constitutional: Negative for activity change, appetite change, chills, diaphoresis and fever.  HENT: Positive for congestion. Negative for sneezing, sore throat, trouble swallowing and voice change.   Respiratory: Positive for cough. Negative for apnea, choking, chest tightness, shortness of breath and wheezing.   Cardiovascular: Negative for chest pain, palpitations and leg swelling.  Gastrointestinal: Negative for abdominal distention, abdominal pain, constipation, diarrhea and nausea.  Genitourinary: Negative for difficulty urinating, dysuria, frequency and urgency.  Musculoskeletal: Negative for back pain, gait problem and myalgias. Arthralgias: typical arthritis.  Skin: Negative for color change, pallor, rash and wound.  Neurological: Negative for dizziness, tremors, syncope, speech difficulty, weakness, numbness and headaches.  Psychiatric/Behavioral: Negative for agitation and behavioral problems.  All other systems reviewed and are negative.   There is no immunization history for the selected administration types on file for this patient. Pertinent  Health Maintenance Due  Topic Date Due  . DEXA SCAN  08/09/1995  . PNA vac  Low Risk Adult (1 of 2 - PCV13) 08/09/1995  . INFLUENZA VACCINE  07/01/2016   No flowsheet data found. Functional Status Survey:    Vitals:   11/11/16 0545  BP: (!) 159/74  Pulse: 66  Resp: 20  Temp: 98.1 F (36.7 C)  SpO2: 94%  Weight: 122 lb 3.2 oz (55.4 kg)   Body mass index is 20.98 kg/m. Physical Exam  Constitutional: She is oriented to person, place, and time. Vital signs are normal. She appears well-developed and well-nourished. She is active and cooperative. She does not appear ill. No distress. Nasal cannula in place.  HENT:  Head: Normocephalic and atraumatic.  Mouth/Throat: Uvula is midline, oropharynx is clear and moist and mucous membranes are normal. Mucous membranes are not pale, not dry and not cyanotic.  Eyes: Conjunctivae, EOM and lids are normal. Pupils are equal, round, and reactive to light.  Neck: Trachea normal, normal range of motion and full passive  range of motion without pain. Neck supple. No JVD present. No tracheal deviation, no edema and no erythema present. No thyromegaly present.  Cardiovascular: Normal rate, regular rhythm, normal heart sounds, intact distal pulses and normal pulses.  Exam reveals no gallop and no distant heart sounds.   Pulmonary/Chest: Effort normal. No accessory muscle usage. No respiratory distress. She has no wheezes. She has no rhonchi. She has rales in the right lower field and the left lower field. She exhibits no tenderness.  Abdominal: Normal appearance and bowel sounds are normal. She exhibits no distension and no ascites. There is no tenderness.  Musculoskeletal: Normal range of motion. She exhibits no edema or tenderness.  Expected osteoarthritis, stiffness  Neurological: She is alert and oriented to person, place, and time. She has normal strength.  Skin: Skin is warm, dry and intact. She is not diaphoretic. No cyanosis. No pallor. Nails show no clubbing.  Psychiatric: She has a normal mood and affect. Her speech is  normal and behavior is normal. Judgment and thought content normal. Cognition and memory are normal.  Nursing note and vitals reviewed.   Labs reviewed:  Recent Labs  10/31/16 0401 11/01/16 0454  11/05/16 0620 11/11/16 0645 11/13/16 0630  NA 124* 124*  < > 127* 130* 131*  K 2.9* 4.2  < > 4.1 3.9 3.9  CL 89* 96*  < > 93* 95* 97*  CO2 25 25  < > _0 GLUCOSE 124* 99  < > 88 80 89  BUN 5* 9  < > <5* 20 19  CREATININE 0.47 0.53  < > 0.47 0.52 0.68  CALCIUM 8.4* 7.6*  < > 8.6* 8.5* 8.5*  MG 1.5* 2.1  --   --   --   --   < > = values in this interval not displayed.  Recent Labs  10/13/16 1429 10/20/16 1935 11/05/16 0620  AST 33 31 73*  ALT 16 21 82*  ALKPHOS 48 56 139*  BILITOT 0.7 0.8 0.5  PROT 7.0 6.5 5.2*  ALBUMIN 3.9 3.9 2.5*    Recent Labs  07/01/16 1219  11/01/16 0454 11/02/16 1027 11/05/16 0620  WBC 17.6*  < > 4.1 5.9 4.0  NEUTROABS 16.1*  --   --   --   --   HGB 13.4  < > 12.0 12.0 11.1*  HCT 37.6  < > 34.3* 34.5* 31.8*  MCV 90.2  < > 88.1 88.4 88.4  PLT 133*  < > 95* 117* 134*  < > = values in this interval not displayed. Lab Results  Component Value Date   TSH 2.780 11/01/2016   No results found for: HGBA1C No results found for: CHOL, HDL, LDLCALC, LDLDIRECT, TRIG, CHOLHDL  Significant Diagnostic Results in last 30 days:  Dg Chest 2 View  Result Date: 10/30/2016 CLINICAL DATA:  Hypertension. EXAM: CHEST  2 VIEW COMPARISON:  10/13/2016. FINDINGS: Mediastinum and hilar structures normal. Cardiomegaly with normal pulmonary vascularity. No focal infiltrate noted. No pleural effusion or pneumothorax. Left mastectomy. No acute or focal bony abnormality. Degenerative changes thoracic spine. IMPRESSION: 1.  No acute cardiopulmonary disease.  Stable cardiomegaly. 2. Left mastectomy . Electronically Signed   By: Marcello Moores  Register   On: 10/30/2016 17:43   Ct Chest Wo Contrast  Result Date: 11/02/2016 CLINICAL DATA:  PMHx of Asthma, left breast cancer  status post mastectomy, COPD, hypertension, peripheral vascular disease, history of subdural hematoma, who was [facility] to [facility] 10/30/2016 for evaluation of shortness of  breath. Patient had been seen by her primary care physician recently for upper respiratory infection. The patient has been having progressive shortness of breath. We are now asked evaluate her for worsening hyponatremia--per MD note EXAM: CT CHEST WITHOUT CONTRAST TECHNIQUE: Multidetector CT imaging of the chest was performed following the standard protocol without IV contrast. COMPARISON:  Chest radiograph, 11/01/2016. FINDINGS: Cardiovascular: Heart is mildly enlarged. There are mild coronary artery calcifications. The aorta is normal in caliber. Atherosclerotic calcifications are noted along the thoracic aorta and at the origin of the branch vessels. There is mild enlargement of the right and left pulmonary arteries both measuring 2.8 cm. Mediastinum/Nodes: No mediastinal or hilar masses. No discrete enlarged lymph nodes. Mild dilation of the esophagus. Trachea is unremarkable. Lungs/Pleura: Small bilateral pleural effusions. Advanced changes of centrilobular emphysema. There is bronchial wall thickening most evident in the lower lobes, seen to a lesser degree in the upper lobes and right middle lobe. Mild lower lung zone interstitial thickening. There is an irregular focal area of opacity in the posterior left upper lobe adjacent to the oblique fissure, centered on image 62, series 3, measuring an average of 1 cm in size. In the posterior, dependent lower lobes, there is hazy and reticular type opacities, which is likely atelectasis. Right greater than left apical pleural parenchymal scarring is noted. No pneumothorax. Upper Abdomen: No acute abnormality. Musculoskeletal: Old sternal fracture. Bones are demineralized. No osteoblastic or osteolytic lesions. Changes from a left mastectomy.  No chest wall mass. IMPRESSION: 1. Small bilateral  pleural effusions, mild dependent lower lobe atelectasis and mild interstitial thickening. Consider mild congestive heart failure superimposed on changes of advanced centrilobular emphysema. 2. There is also bronchial wall thickening that is most prominent in the lower lobes. This may all be chronic. Acute bronchial inflammation/infection should be considered in the proper clinical setting. 3. No convincing pneumonia. 4. 1 cm irregular nodular opacity in the left upper lobe. Consider one of the following in 3 months for both low-risk and high-risk individuals: (a) repeat chest CT, (b) follow-up PET-CT, or (c) tissue sampling. This recommendation follows the consensus statement: Guidelines for Management of Incidental Pulmonary Nodules Detected on CT Images: From the Fleischner Society 2017; Radiology 2017; 284:228-243. Since this may be atelectasis or inflammatory in origin, follow-up chest CT in 3 months is favored. 5. Mild enlargement of the pulmonary arteries consistent with pulmonary hypertension. Electronically Signed   By: Lajean Manes M.D.   On: 11/02/2016 15:08   Dg Chest Port 1 View  Result Date: 11/02/2016 CLINICAL DATA:  Confirm line placement EXAM: PORTABLE CHEST 1 VIEW COMPARISON:  Chest CT from earlier today FINDINGS: Right upper extremity PICC with tip at the SVC level. Otherwise no change in the chest from CT earlier today. Hyperinflation and generalized interstitial opacities. Chronic cardiomegaly. Stable mediastinal contours. IMPRESSION: New right upper extremity PICC with tip at the SVC. Electronically Signed   By: Monte Fantasia M.D.   On: 11/02/2016 17:25   Dg Chest Port 1 View  Result Date: 11/01/2016 CLINICAL DATA:  Worsening shortness of breath. Wheezing. Hypertension. Hyponatremia. EXAM: PORTABLE CHEST 1 VIEW COMPARISON:  10/30/2016. FINDINGS: Stable enlarged cardiac silhouette. Aortic arch calcifications. The lungs remain hyperexpanded with diffusely prominent interstitial  markings. Diffuse osteopenia. IMPRESSION: No acute abnormality.  Stable cardiomegaly and changes of COPD. Electronically Signed   By: Claudie Revering M.D.   On: 11/01/2016 18:03    Assessment/Plan 1. Chronic obstructive pulmonary disease, unspecified COPD type (HCC)  Azithromycin 250 mg  tablets-2 tablets today, then 1 tablet daily 4 days  Wean off O2 as tolerated  Family/ staff Communication:   Total Time:  Documentation:  Face to Face:  Family/Phone:   Labs/tests ordered:  Met B  Medication list reviewed and assessed for continued appropriateness.  Vikki Ports, NP-C Geriatrics [facility] [phone], [place] [phone] Cell Phone (Mon-Fri 8am-5pm):  [phone] On Call:  [phone] & follow prompts after 5pm & weekends Office Phone:  [phone] Office Fax:  [phone]

## 2016-11-27 NOTE — Progress Notes (Signed)
Location:      Place of Service:  SNF (31) Provider:  Toni Arthurs, NP-C  Dion Body, MD  Patient Care Team: Dion Body, MD as PCP - General (Family Medicine)  Extended Emergency Contact Information Primary Emergency Contact: [place],Rosemary Address: [address] of [place] Phone: [phone] Work Phone: [phone] Relation: Daughter Secondary Emergency Contact: Ladona Horns [place] of [place] Phone: [phone] Mobile Phone: [phone] Relation: Friend  Code Status:  Full Goals of care: Advanced Directive information Advanced Directives 11/10/2016  Does Patient Have a Medical Advance Directive? Yes  Type of Paramedic of [place];Living will  Does patient want to make changes to medical advance directive? -  Copy of [place] in Chart? No - copy requested  Would patient like information on creating a medical advance directive? -     Chief Complaint  Patient presents with  . Discharge Note    HPI:  Pt is a 82 y.o. female seen today for Discharge evaluation visit for COPD exacerbation and SIADH. Patient complained of cough with intermittent expectoration of brown sputum. Patient has history of COPD. Afebrile. Patient denies chest pain, shortness of breath. Patient denies having oxygen at home. Patient was able to be weaned off of the oxygen to increase her mobility. Z- pack given. Hyponatremia moving towards resolution. Patient verbalizes understanding to continue to drink fluids with sodium such as Gatorade. Vital signs stable. No other complaints.    Past Medical History:  Diagnosis Date  . Asthma   . Breast cancer ([place]) 1991   mastectomy left  . Cancer ([place]) 1991   L Breast  . COPD (chronic obstructive pulmonary disease) ([place])   . Heart murmur   . Hypertension   . Peripheral vascular disease ([place])   . Shortness of breath dyspnea   . Traumatic subdural  hematoma [facility])    Past Surgical History:  Procedure Laterality Date  . ABDOMINAL HYSTERECTOMY     Partial  . APPENDECTOMY    . BREAST BIOPSY Right    1980 and 1970's  . BREAST SURGERY  1991   L Mastectomy  . MASTECTOMY    . ORIF PATELLA Left 04/12/2015   Procedure: OPEN REDUCTION INTERNAL (ORIF) FIXATION PATELLA;  Surgeon: Hessie Knows, MD;  Location: [facility] ORS;  Service: Orthopedics;  Laterality: Left;  . PERIPHERAL VASCULAR CATHETERIZATION Left 04/17/2015   Procedure: Lower Extremity Angiography;  Surgeon: Katha Cabal, MD;  Location: [place] [facility];  Service: Cardiovascular;  Laterality: Left;  . PERIPHERAL VASCULAR CATHETERIZATION  04/17/2015   Procedure: Lower Extremity Intervention;  Surgeon: Katha Cabal, MD;  Location: [place] [facility];  Service: Cardiovascular;;  . TONSILLECTOMY      Allergies  Allergen Reactions  . Amlodipine Swelling     [Onset: 08/26/2013]  . Codeine Nausea Only  . Sulfa Antibiotics Rash and Nausea Only    Allergies as of 11/20/2016      Reactions   Amlodipine Swelling    [Onset: 08/26/2013]   Codeine Nausea Only   Sulfa Antibiotics Rash, Nausea Only      Medication List       Accurate as of 11/20/16 11:59 PM. Always use your most recent med list.          acetaminophen 325 MG tablet Commonly known as:  TYLENOL Take 2 tablets (650 mg total) by mouth every 6 (six) hours as needed for mild pain (or  Fever >/= 101).   ADVAIR DISKUS 250-50 MCG/DOSE Aepb Generic drug:  Fluticasone-Salmeterol Inhale 1 puff into the lungs 2 (two) times daily.   aspirin EC 81 MG tablet Take 81 mg by mouth daily.   CALCIUM + D PO Take 1 tablet by mouth 2 (two) times daily.   carvedilol 25 MG tablet Commonly known as:  COREG Take 25 mg by mouth 2 (two) times daily with a meal.   CENTRUM SILVER PO Take 1 tablet by mouth daily.   cloNIDine 0.1 MG tablet Commonly known as:  CATAPRES Take 1 tablet (0.1 mg total) by mouth 2 (two) times  daily.   guaiFENesin 600 MG 12 hr tablet Commonly known as:  MUCINEX Take 1 tablet (600 mg total) by mouth 2 (two) times daily.   mirtazapine 7.5 MG tablet Commonly known as:  REMERON Take 1 tablet (7.5 mg total) by mouth at bedtime.   omeprazole 40 MG capsule Commonly known as:  PRILOSEC Take 40 mg by mouth daily.   potassium chloride SA 20 MEQ tablet Commonly known as:  K-DUR,KLOR-CON Take 20 mEq by mouth 2 (two) times daily.   predniSONE 20 MG tablet Commonly known as:  DELTASONE Take 20 mg by mouth daily with breakfast.   raloxifene 60 MG tablet Commonly known as:  EVISTA Take 60 mg by mouth daily.   tiotropium 18 MCG inhalation capsule Commonly known as:  SPIRIVA Place 18 mcg into inhaler and inhale daily.   traZODone 50 MG tablet Commonly known as:  DESYREL Take 50 mg by mouth at bedtime as needed for sleep.   VITAMIN C PO Take 1 capsule by mouth daily.       Review of Systems  Constitutional: Negative for activity change, appetite change, chills, diaphoresis and fever.  HENT: Positive for congestion. Negative for sneezing, sore throat, trouble swallowing and voice change.   Respiratory: Positive for cough. Negative for apnea, choking, chest tightness, shortness of breath and wheezing.   Cardiovascular: Negative for chest pain, palpitations and leg swelling.  Gastrointestinal: Negative for abdominal distention, abdominal pain, constipation, diarrhea and nausea.  Genitourinary: Negative for difficulty urinating, dysuria, frequency and urgency.  Musculoskeletal: Negative for back pain, gait problem and myalgias. Arthralgias: typical arthritis.  Skin: Negative for color change, pallor, rash and wound.  Neurological: Negative for dizziness, tremors, syncope, speech difficulty, weakness, numbness and headaches.  Psychiatric/Behavioral: Negative for agitation and behavioral problems.  All other systems reviewed and are negative.   There is no immunization history  for the selected administration types on file for this patient. Pertinent  Health Maintenance Due  Topic Date Due  . DEXA SCAN  08/09/1995  . PNA vac Low Risk Adult (1 of 2 - PCV13) 08/09/1995  . INFLUENZA VACCINE  07/01/2016   No flowsheet data found. Functional Status Survey:    Vitals:   11/20/16 0542  BP: 123/71  Pulse: 63  Resp: 20  Temp: 97.9 F (36.6 C)  SpO2: 94%   There is no height or weight on file to calculate BMI. Physical Exam  Constitutional: She is oriented to person, place, and time. Vital signs are normal. She appears well-developed and well-nourished. She is active and cooperative. She does not appear ill. No distress. Nasal cannula in place.  HENT:  Head: Normocephalic and atraumatic.  Mouth/Throat: Uvula is midline, oropharynx is clear and moist and mucous membranes are normal. Mucous membranes are not pale, not dry and not cyanotic.  Eyes: Conjunctivae, EOM and lids are normal. Pupils  are equal, round, and reactive to light.  Neck: Trachea normal, normal range of motion and full passive range of motion without pain. Neck supple. No JVD present. No tracheal deviation, no edema and no erythema present. No thyromegaly present.  Cardiovascular: Normal rate, regular rhythm, normal heart sounds, intact distal pulses and normal pulses.  Exam reveals no gallop and no distant heart sounds.   Pulmonary/Chest: Effort normal. No accessory muscle usage. No respiratory distress. She has no wheezes. She has no rhonchi. She has rales in the right lower field and the left lower field. She exhibits no tenderness.  Abdominal: Normal appearance and bowel sounds are normal. She exhibits no distension and no ascites. There is no tenderness.  Musculoskeletal: Normal range of motion. She exhibits no edema or tenderness.  Expected osteoarthritis, stiffness  Neurological: She is alert and oriented to person, place, and time. She has normal strength.  Skin: Skin is warm, dry and intact.  She is not diaphoretic. No cyanosis. No pallor. Nails show no clubbing.  Psychiatric: She has a normal mood and affect. Her speech is normal and behavior is normal. Judgment and thought content normal. Cognition and memory are normal.  Nursing note and vitals reviewed.   Labs reviewed:  Recent Labs  10/31/16 0401 11/01/16 0454  11/05/16 0620 11/11/16 0645 11/13/16 0630  NA 124* 124*  < > 127* 130* 131*  K 2.9* 4.2  < > 4.1 3.9 3.9  CL 89* 96*  < > 93* 95* 97*  CO2 25 25  < > 30 30 31   GLUCOSE 124* 99  < > 88 80 89  BUN 5* 9  < > <5* 20 19  CREATININE 0.47 0.53  < > 0.47 0.52 0.68  CALCIUM 8.4* 7.6*  < > 8.6* 8.5* 8.5*  MG 1.5* 2.1  --   --   --   --   < > = values in this interval not displayed.  Recent Labs  10/13/16 1429 10/20/16 1935 11/05/16 0620  AST 33 31 73*  ALT 16 21 82*  ALKPHOS 48 56 139*  BILITOT 0.7 0.8 0.5  PROT 7.0 6.5 5.2*  ALBUMIN 3.9 3.9 2.5*    Recent Labs  07/01/16 1219  11/01/16 0454 11/02/16 1027 11/05/16 0620  WBC 17.6*  < > 4.1 5.9 4.0  NEUTROABS 16.1*  --   --   --   --   HGB 13.4  < > 12.0 12.0 11.1*  HCT 37.6  < > 34.3* 34.5* 31.8*  MCV 90.2  < > 88.1 88.4 88.4  PLT 133*  < > 95* 117* 134*  < > = values in this interval not displayed. Lab Results  Component Value Date   TSH 2.780 11/01/2016   No results found for: HGBA1C No results found for: CHOL, HDL, LDLCALC, LDLDIRECT, TRIG, CHOLHDL  Significant Diagnostic Results in last 30 days:  Dg Chest 2 View  Result Date: 10/30/2016 CLINICAL DATA:  Hypertension. EXAM: CHEST  2 VIEW COMPARISON:  10/13/2016. FINDINGS: Mediastinum and hilar structures normal. Cardiomegaly with normal pulmonary vascularity. No focal infiltrate noted. No pleural effusion or pneumothorax. Left mastectomy. No acute or focal bony abnormality. Degenerative changes thoracic spine. IMPRESSION: 1.  No acute cardiopulmonary disease.  Stable cardiomegaly. 2. Left mastectomy . Electronically Signed   By: Marcello Moores   Register   On: 10/30/2016 17:43   Ct Chest Wo Contrast  Result Date: 11/02/2016 CLINICAL DATA:  PMHx of Asthma, left breast cancer status post mastectomy, COPD, hypertension, peripheral  vascular disease, history of subdural hematoma, who was admitted to [facility] 10/30/2016 for evaluation of shortness of breath. Patient had been seen by her primary care physician recently for upper respiratory infection. The patient has been having progressive shortness of breath. We are now asked evaluate her for worsening hyponatremia--per MD note EXAM: CT CHEST WITHOUT CONTRAST TECHNIQUE: Multidetector CT imaging of the chest was performed following the standard protocol without IV contrast. COMPARISON:  Chest radiograph, 11/01/2016. FINDINGS: Cardiovascular: Heart is mildly enlarged. There are mild coronary artery calcifications. The aorta is normal in caliber. Atherosclerotic calcifications are noted along the thoracic aorta and at the origin of the branch vessels. There is mild enlargement of the right and left pulmonary arteries both measuring 2.8 cm. Mediastinum/Nodes: No mediastinal or hilar masses. No discrete enlarged lymph nodes. Mild dilation of the esophagus. Trachea is unremarkable. Lungs/Pleura: Small bilateral pleural effusions. Advanced changes of centrilobular emphysema. There is bronchial wall thickening most evident in the lower lobes, seen to a lesser degree in the upper lobes and right middle lobe. Mild lower lung zone interstitial thickening. There is an irregular focal area of opacity in the posterior left upper lobe adjacent to the oblique fissure, centered on image 62, series 3, measuring an average of 1 cm in size. In the posterior, dependent lower lobes, there is hazy and reticular type opacities, which is likely atelectasis. Right greater than left apical pleural parenchymal scarring is noted. No pneumothorax. Upper Abdomen: No acute abnormality. Musculoskeletal: Old sternal fracture. Bones are  demineralized. No osteoblastic or osteolytic lesions. Changes from a left mastectomy.  No chest wall mass. IMPRESSION: 1. Small bilateral pleural effusions, mild dependent lower lobe atelectasis and mild interstitial thickening. Consider mild congestive heart failure superimposed on changes of advanced centrilobular emphysema. 2. There is also bronchial wall thickening that is most prominent in the lower lobes. This may all be chronic. Acute bronchial inflammation/infection should be considered in the proper clinical setting. 3. No convincing pneumonia. 4. 1 cm irregular nodular opacity in the left upper lobe. Consider one of the following in 3 months for both low-risk and high-risk individuals: (a) repeat chest CT, (b) follow-up PET-CT, or (c) tissue sampling. This recommendation follows the consensus statement: Guidelines for Management of Incidental Pulmonary Nodules Detected on CT Images: From the Fleischner Society 2017; Radiology 2017; 284:228-243. Since this may be atelectasis or inflammatory in origin, follow-up chest CT in 3 months is favored. 5. Mild enlargement of the pulmonary arteries consistent with pulmonary hypertension. Electronically Signed   By: Lajean Manes M.D.   On: 11/02/2016 15:08   Dg Chest Port 1 View  Result Date: 11/02/2016 CLINICAL DATA:  Confirm line placement EXAM: PORTABLE CHEST 1 VIEW COMPARISON:  Chest CT from earlier today FINDINGS: Right upper extremity PICC with tip at the SVC level. Otherwise no change in the chest from CT earlier today. Hyperinflation and generalized interstitial opacities. Chronic cardiomegaly. Stable mediastinal contours. IMPRESSION: New right upper extremity PICC with tip at the SVC. Electronically Signed   By: Monte Fantasia M.D.   On: 11/02/2016 17:25   Dg Chest Port 1 View  Result Date: 11/01/2016 CLINICAL DATA:  Worsening shortness of breath. Wheezing. Hypertension. Hyponatremia. EXAM: PORTABLE CHEST 1 VIEW COMPARISON:  10/30/2016. FINDINGS:  Stable enlarged cardiac silhouette. Aortic arch calcifications. The lungs remain hyperexpanded with diffusely prominent interstitial markings. Diffuse osteopenia. IMPRESSION: No acute abnormality.  Stable cardiomegaly and changes of COPD. Electronically Signed   By: Claudie Revering M.D.   On: 11/01/2016  18:03    Assessment/Plan 1. Chronic obstructive pulmonary disease, unspecified COPD type ([place])  Incentive spirometer  Wean off O2 as tolerated  Continue to work with PT/OT for strengthening due to deconditioning  Continue exercises as taught by PT OT  2. Hyponatremia  Resolved  Family/ staff Communication:   Total Time:  Documentation:  Face to Face:  Family/Phone:  Patient is being discharged with the following home health services:  None  Patient is being discharged with the following durable medical equipment:  None  Patient has been advised to f/u with their PCP in 1-2 weeks to bring them up to date on their rehab stay.  Social services at facility was responsible for arranging this appointment.  Pt was provided with a 30 day supply of prescriptions for medications and refills must be obtained from their PCP.  For controlled substances, a more limited supply may be provided adequate until PCP appointment only.  Labs/tests ordered:  None, per PCP  Medication list reviewed and assessed for continued appropriateness.  Vikki Ports, NP-C Geriatrics [facility] [phone], [place] [address] Cell Phone (Mon-Fri 8am-5pm):  [phone] On Call:  [phone] & follow prompts after 5pm & weekends Office Phone:  [phone] Office Fax:  [phone]

## 2016-11-28 DIAGNOSIS — Z8639 Personal history of other endocrine, nutritional and metabolic disease: Secondary | ICD-10-CM | POA: Diagnosis not present

## 2016-11-28 DIAGNOSIS — J441 Chronic obstructive pulmonary disease with (acute) exacerbation: Secondary | ICD-10-CM | POA: Diagnosis not present

## 2016-11-28 DIAGNOSIS — W548XXA Other contact with dog, initial encounter: Secondary | ICD-10-CM | POA: Diagnosis not present

## 2016-11-28 DIAGNOSIS — S81811A Laceration without foreign body, right lower leg, initial encounter: Secondary | ICD-10-CM | POA: Diagnosis not present

## 2016-12-09 ENCOUNTER — Ambulatory Visit
Admission: RE | Admit: 2016-12-09 | Discharge: 2016-12-09 | Disposition: A | Discharge status: Home / Self Care | Payer: PPO | Source: Ambulatory Visit | Attending: Physician Assistant | Admitting: Physician Assistant

## 2016-12-09 ENCOUNTER — Other Ambulatory Visit: Payer: Self-pay | Admitting: Physician Assistant

## 2016-12-09 DIAGNOSIS — M79661 Pain in right lower leg: Secondary | ICD-10-CM

## 2016-12-09 DIAGNOSIS — M7989 Other specified soft tissue disorders: Principal | ICD-10-CM

## 2016-12-09 DIAGNOSIS — R2241 Localized swelling, mass and lump, right lower limb: Secondary | ICD-10-CM | POA: Diagnosis not present

## 2016-12-09 DIAGNOSIS — M79604 Pain in right leg: Secondary | ICD-10-CM | POA: Diagnosis not present

## 2016-12-11 DIAGNOSIS — R911 Solitary pulmonary nodule: Secondary | ICD-10-CM | POA: Diagnosis not present

## 2016-12-11 DIAGNOSIS — R0609 Other forms of dyspnea: Secondary | ICD-10-CM | POA: Diagnosis not present

## 2016-12-11 DIAGNOSIS — J449 Chronic obstructive pulmonary disease, unspecified: Secondary | ICD-10-CM | POA: Diagnosis not present

## 2016-12-11 DIAGNOSIS — R0602 Shortness of breath: Secondary | ICD-10-CM | POA: Diagnosis not present

## 2016-12-23 DIAGNOSIS — I10 Essential (primary) hypertension: Secondary | ICD-10-CM | POA: Diagnosis not present

## 2016-12-23 DIAGNOSIS — E871 Hypo-osmolality and hyponatremia: Secondary | ICD-10-CM | POA: Diagnosis not present

## 2016-12-23 DIAGNOSIS — E876 Hypokalemia: Secondary | ICD-10-CM | POA: Diagnosis not present

## 2017-01-28 DIAGNOSIS — B351 Tinea unguium: Secondary | ICD-10-CM | POA: Diagnosis not present

## 2017-01-28 DIAGNOSIS — M79674 Pain in right toe(s): Secondary | ICD-10-CM | POA: Diagnosis not present

## 2017-01-28 DIAGNOSIS — M79675 Pain in left toe(s): Secondary | ICD-10-CM | POA: Diagnosis not present

## 2017-02-03 DIAGNOSIS — I10 Essential (primary) hypertension: Secondary | ICD-10-CM | POA: Diagnosis not present

## 2017-02-03 DIAGNOSIS — E871 Hypo-osmolality and hyponatremia: Secondary | ICD-10-CM | POA: Diagnosis not present

## 2017-02-03 DIAGNOSIS — E876 Hypokalemia: Secondary | ICD-10-CM | POA: Diagnosis not present

## 2017-02-08 NOTE — Progress Notes (Signed)
[place]  [phone] [phone]  ID: Elwanda [place] OB: 08/26/1928  MR#: [id number]  AYT#:[id number]  Patient Care Team: Dion Body, MD as PCP - General (Family Medicine)  CHIEF COMPLAINT: Left upper lobe lung mass.  INTERVAL HISTORY: Patient returns to clinic today for further evaluation and discussion of her imaging results. She currently feels well and is back to her baseline. She has no neurologic complaints. She denies any recent fevers. She denies any chest pain, cough, or hemoptysis. She has a good appetite and denies weight loss. She has no nausea, vomiting, constipation, or diarrhea. She has no urinary complaints. Patient offers no specific complaints today.  REVIEW OF SYSTEMS:   Review of Systems  Constitutional: Negative for fever, malaise/fatigue and weight loss.  Respiratory: Positive for shortness of breath. Negative for cough and hemoptysis.   Cardiovascular: Negative.  Negative for chest pain and leg swelling.  Gastrointestinal: Negative.  Negative for abdominal pain.  Genitourinary: Negative.   Musculoskeletal: Negative.   Neurological: Negative.  Negative for weakness.  Psychiatric/Behavioral: Negative.  The patient is not nervous/anxious.     As per HPI. Otherwise, a complete review of systems is negative.  PAST MEDICAL HISTORY: Past Medical History:  Diagnosis Date  . Asthma   . Breast cancer ([place]) 1991   mastectomy left  . Cancer ([place]) 1991   L Breast  . COPD (chronic obstructive pulmonary disease) ([place])   . Heart murmur   . Hypertension   . Peripheral vascular disease ([place])   . Shortness of breath dyspnea   . Traumatic subdural hematoma ([facility])     PAST SURGICAL HISTORY: Past Surgical History:  Procedure Laterality Date  . ABDOMINAL HYSTERECTOMY     Partial  . APPENDECTOMY    . BREAST BIOPSY Right    1980 and 1970's  . BREAST SURGERY  1991   L Mastectomy  . MASTECTOMY    . ORIF PATELLA Left  04/12/2015   Procedure: OPEN REDUCTION INTERNAL (ORIF) FIXATION PATELLA;  Surgeon: Hessie Knows, MD;  Location: [facility] ORS;  Service: Orthopedics;  Laterality: Left;  . PERIPHERAL VASCULAR CATHETERIZATION Left 04/17/2015   Procedure: Lower Extremity Angiography;  Surgeon: Katha Cabal, MD;  Location: [facility];  Service: Cardiovascular;  Laterality: Left;  . PERIPHERAL VASCULAR CATHETERIZATION  04/17/2015   Procedure: Lower Extremity Intervention;  Surgeon: Katha Cabal, MD;  Location: [facility];  Service: Cardiovascular;;  . TONSILLECTOMY      FAMILY HISTORY: Family History  Problem Relation Age of Onset  . CVA Mother   . Kidney disease Mother   . Heart disease Father   . Kidney disease Cousin 73  . Kidney disease Cousin 9  . Breast cancer Neg Hx     ADVANCED DIRECTIVES (Y/N):  N  HEALTH MAINTENANCE: Social History  Substance Use Topics  . Smoking status: Former Smoker    Packs/day: 0.50    Years: 20.00    Types: Cigarettes    Quit date: 01/26/1979  . Smokeless tobacco: Never Used  . Alcohol use No     Colonoscopy:  PAP:  Bone density:  Lipid panel:  Allergies  Allergen Reactions  . Amlodipine Swelling     [Onset: 08/26/2013]  . Codeine Nausea Only  . Sulfa Antibiotics Rash and Nausea Only    Current Outpatient Prescriptions  Medication Sig Dispense Refill  . acetaminophen (TYLENOL) 325 MG tablet Take 2 tablets (650 mg total) by mouth every 6 (six) hours as  needed for mild pain (or Fever >/= 101). 30 tablet 0  . Ascorbic Acid (VITAMIN C PO) Take 1 capsule by mouth daily.    Marland Kitchen aspirin EC 81 MG tablet Take 81 mg by mouth daily.    . Calcium Carbonate-Vitamin D (CALCIUM + D PO) Take 1 tablet by mouth 2 (two) times daily.    . carvedilol (COREG) 25 MG tablet Take 25 mg by mouth 2 (two) times daily with a meal.    . cloNIDine (CATAPRES) 0.1 MG tablet Take 1 tablet (0.1 mg total) by mouth 2 (two) times daily. 30 tablet 0  .  Fluticasone-Salmeterol (ADVAIR DISKUS) 250-50 MCG/DOSE AEPB Inhale 1 puff into the lungs 2 (two) times daily.    Marland Kitchen guaiFENesin (MUCINEX) 600 MG 12 hr tablet Take 1 tablet (600 mg total) by mouth 2 (two) times daily. 14 tablet 0  . mirtazapine (REMERON) 7.5 MG tablet Take 1 tablet (7.5 mg total) by mouth at bedtime. 30 tablet 0  . Multiple Vitamins-Minerals (CENTRUM SILVER PO) Take 1 tablet by mouth daily.    Marland Kitchen omeprazole (PRILOSEC) 40 MG capsule Take 40 mg by mouth daily.     . potassium chloride SA (K-DUR,KLOR-CON) 20 MEQ tablet Take 20 mEq by mouth 2 (two) times daily.     . predniSONE (DELTASONE) 20 MG tablet Take 20 mg by mouth daily with breakfast.    . raloxifene (EVISTA) 60 MG tablet Take 60 mg by mouth daily.    Marland Kitchen tiotropium (SPIRIVA) 18 MCG inhalation capsule Place 18 mcg into inhaler and inhale daily.     . traZODone (DESYREL) 50 MG tablet Take 50 mg by mouth at bedtime as needed for sleep.     No current facility-administered medications for this visit.     OBJECTIVE: Vitals:   02/10/17 1447  BP: (!) 149/81  Pulse: 61  Resp: 18  Temp: 97.3 F (36.3 C)     Body mass index is 20.81 kg/m.    ECOG FS:1 - Symptomatic but completely ambulatory  General: Well-developed, well-nourished, no acute distress. Eyes: Pink conjunctiva, anicteric sclera. Lungs: scattered wheezing throughout. Heart: Regular rate and rhythm. No rubs, murmurs, or gallops. Abdomen: Soft, nontender, nondistended. No organomegaly noted, normoactive bowel sounds. Musculoskeletal: No edema, cyanosis, or clubbing. Neuro: Alert, answering all questions appropriately. Cranial nerves grossly intact. Skin: No rashes or petechiae noted. Psych: Normal affect.   LAB RESULTS:  Lab Results  Component Value Date   NA 132 (L) 02/09/2017   K 4.5 02/09/2017   CL 97 (L) 02/09/2017   CO2 27 02/09/2017   GLUCOSE 109 (H) 02/09/2017   BUN 11 02/09/2017   CREATININE 0.48 02/09/2017   CALCIUM 8.7 (L) 02/09/2017   PROT  6.3 (L) 02/09/2017   ALBUMIN 3.6 02/09/2017   AST 64 (H) 02/09/2017   ALT 61 (H) 02/09/2017   ALKPHOS 65 02/09/2017   BILITOT 1.0 02/09/2017   GFRNONAA >60 02/09/2017   GFRAA >60 02/09/2017    Lab Results  Component Value Date   WBC 4.7 02/09/2017   NEUTROABS 3.4 02/09/2017   HGB 12.1 02/09/2017   HCT 35.4 02/09/2017   MCV 89.3 02/09/2017   PLT 140 (L) 02/09/2017     STUDIES: Ct Chest W Contrast  Result Date: 02/09/2017 CLINICAL DATA:  Followup left upper lobe pulmonary nodule. Personal history of left breast carcinoma. EXAM: CT CHEST WITH CONTRAST TECHNIQUE: Multidetector CT imaging of the chest was performed during intravenous contrast administration. CONTRAST:  65mL ISOVUE-300 IOPAMIDOL (ISOVUE-300) INJECTION  61% COMPARISON:  11/02/2016 FINDINGS: Cardiovascular: No acute findings. Stable cardiomegaly without pericardial effusion. Aortic and coronary artery atherosclerosis. Mediastinum/Nodes: No masses or pathologically enlarged lymph nodes identified. Previous left mastectomy, without axillary lymphadenopathy. Lungs/Pleura: Moderate to severe emphysema again demonstrated. Previously seen nodule in the posterior lingula has resolved since previous study, consistent with resolving inflammatory or infectious etiology. No suspicious pulmonary nodules or masses are identified. Small bilateral pleural effusions show no significant change. Upper Abdomen:  Unremarkable. Musculoskeletal:  No suspicious bone lesions. IMPRESSION: Interval resolution of lingular pulmonary nodule, consistent with resolving inflammatory or infectious etiology. No evidence of metastatic disease within the thorax. Stable moderate to severe emphysema. Stable small bilateral pleural effusions. Electronically Signed   By: Earle Gell M.D.   On: 02/09/2017 15:25    ASSESSMENT: Left upper lobe lung mass  PLAN:    1. Left upper lobe lung mass: CT scan results from February 09, 2017 reviewed independently and reported as  above with complete resolution of left upper lobe lung mass. No further intervention is needed at this time. Patient does not require any additional repeat imaging. No follow-up has been scheduled. 2. Thrombocytopenia: Mild, monitor. 3. Hypertension:Patient's blood pressure  Monitor.   Patient expressed understanding and was in agreement with this plan. She also understands that She can call clinic at any time with any questions, concerns, or complaints.    Lloyd Huger, MD   02/13/2017 9:26 AM

## 2017-02-09 ENCOUNTER — Inpatient Hospital Stay: Payer: PPO

## 2017-02-09 ENCOUNTER — Ambulatory Visit: Admission: RE | Admit: 2017-02-09 | Payer: PPO | Source: Ambulatory Visit

## 2017-02-09 ENCOUNTER — Ambulatory Visit
Admission: RE | Admit: 2017-02-09 | Discharge: 2017-02-09 | Disposition: A | Discharge status: Home / Self Care | Payer: PPO | Source: Ambulatory Visit | Attending: Oncology | Admitting: Oncology

## 2017-02-09 DIAGNOSIS — D696 Thrombocytopenia, unspecified: Secondary | ICD-10-CM | POA: Insufficient documentation

## 2017-02-09 DIAGNOSIS — Z79899 Other long term (current) drug therapy: Secondary | ICD-10-CM | POA: Insufficient documentation

## 2017-02-09 DIAGNOSIS — R011 Cardiac murmur, unspecified: Secondary | ICD-10-CM | POA: Diagnosis not present

## 2017-02-09 DIAGNOSIS — J90 Pleural effusion, not elsewhere classified: Secondary | ICD-10-CM | POA: Insufficient documentation

## 2017-02-09 DIAGNOSIS — R0602 Shortness of breath: Secondary | ICD-10-CM | POA: Insufficient documentation

## 2017-02-09 DIAGNOSIS — Z7982 Long term (current) use of aspirin: Secondary | ICD-10-CM | POA: Insufficient documentation

## 2017-02-09 DIAGNOSIS — I739 Peripheral vascular disease, unspecified: Secondary | ICD-10-CM | POA: Insufficient documentation

## 2017-02-09 DIAGNOSIS — I10 Essential (primary) hypertension: Secondary | ICD-10-CM | POA: Diagnosis not present

## 2017-02-09 DIAGNOSIS — J439 Emphysema, unspecified: Secondary | ICD-10-CM | POA: Diagnosis not present

## 2017-02-09 DIAGNOSIS — Z803 Family history of malignant neoplasm of breast: Secondary | ICD-10-CM | POA: Diagnosis not present

## 2017-02-09 DIAGNOSIS — I517 Cardiomegaly: Secondary | ICD-10-CM | POA: Diagnosis not present

## 2017-02-09 DIAGNOSIS — R918 Other nonspecific abnormal finding of lung field: Secondary | ICD-10-CM | POA: Insufficient documentation

## 2017-02-09 DIAGNOSIS — Z87820 Personal history of traumatic brain injury: Secondary | ICD-10-CM | POA: Insufficient documentation

## 2017-02-09 DIAGNOSIS — Z90722 Acquired absence of ovaries, bilateral: Secondary | ICD-10-CM | POA: Insufficient documentation

## 2017-02-09 DIAGNOSIS — R911 Solitary pulmonary nodule: Secondary | ICD-10-CM | POA: Diagnosis not present

## 2017-02-09 DIAGNOSIS — Z87891 Personal history of nicotine dependence: Secondary | ICD-10-CM | POA: Insufficient documentation

## 2017-02-09 DIAGNOSIS — Z90710 Acquired absence of both cervix and uterus: Secondary | ICD-10-CM | POA: Insufficient documentation

## 2017-02-09 DIAGNOSIS — Z9012 Acquired absence of left breast and nipple: Secondary | ICD-10-CM | POA: Insufficient documentation

## 2017-02-09 DIAGNOSIS — Z853 Personal history of malignant neoplasm of breast: Secondary | ICD-10-CM | POA: Insufficient documentation

## 2017-02-09 DIAGNOSIS — J449 Chronic obstructive pulmonary disease, unspecified: Secondary | ICD-10-CM | POA: Insufficient documentation

## 2017-02-09 LAB — CBC WITH DIFFERENTIAL/PLATELET
Basophils Absolute: 0 10*3/uL (ref 0–0.1)
Basophils Relative: 1 %
EOS ABS: 0.1 10*3/uL (ref 0–0.7)
EOS PCT: 1 %
HCT: 35.4 % (ref 35.0–47.0)
Hemoglobin: 12.1 g/dL (ref 12.0–16.0)
LYMPHS ABS: 0.8 10*3/uL — AB (ref 1.0–3.6)
Lymphocytes Relative: 17 %
MCH: 30.5 pg (ref 26.0–34.0)
MCHC: 34.1 g/dL (ref 32.0–36.0)
MCV: 89.3 fL (ref 80.0–100.0)
MONO ABS: 0.4 10*3/uL (ref 0.2–0.9)
Monocytes Relative: 8 %
Neutro Abs: 3.4 10*3/uL (ref 1.4–6.5)
Neutrophils Relative %: 73 %
PLATELETS: 140 10*3/uL — AB (ref 150–440)
RBC: 3.97 MIL/uL (ref 3.80–5.20)
RDW: 14.3 % (ref 11.5–14.5)
WBC: 4.7 10*3/uL (ref 3.6–11.0)

## 2017-02-09 LAB — COMPREHENSIVE METABOLIC PANEL
ALT: 61 U/L — AB (ref 14–54)
ANION GAP: 8 (ref 5–15)
AST: 64 U/L — ABNORMAL HIGH (ref 15–41)
Albumin: 3.6 g/dL (ref 3.5–5.0)
Alkaline Phosphatase: 65 U/L (ref 38–126)
BUN: 11 mg/dL (ref 6–20)
CHLORIDE: 97 mmol/L — AB (ref 101–111)
CO2: 27 mmol/L (ref 22–32)
CREATININE: 0.48 mg/dL (ref 0.44–1.00)
Calcium: 8.7 mg/dL — ABNORMAL LOW (ref 8.9–10.3)
GFR calc non Af Amer: >60 mL/min (ref >60)
Glucose, Bld: 109 mg/dL — ABNORMAL HIGH (ref 65–99)
POTASSIUM: 4.5 mmol/L (ref 3.5–5.1)
SODIUM: 132 mmol/L — AB (ref 135–145)
Total Bilirubin: 1 mg/dL (ref 0.3–1.2)
Total Protein: 6.3 g/dL — ABNORMAL LOW (ref 6.5–8.1)

## 2017-02-09 MED ORDER — IOPAMIDOL (ISOVUE-300) INJECTION 61%
75.0000 mL | Freq: Once | INTRAVENOUS | Status: AC | PRN
  Administered 2017-02-09: 75 mL via INTRAVENOUS

## 2017-02-10 ENCOUNTER — Inpatient Hospital Stay: Payer: PPO | Attending: Oncology | Admitting: Oncology

## 2017-02-10 VITALS — BP 149/81 | HR 61 | Temp 97.3°F | Resp 18 | Wt 121.3 lb

## 2017-02-10 DIAGNOSIS — I517 Cardiomegaly: Secondary | ICD-10-CM

## 2017-02-10 DIAGNOSIS — R918 Other nonspecific abnormal finding of lung field: Secondary | ICD-10-CM

## 2017-02-10 DIAGNOSIS — R0602 Shortness of breath: Secondary | ICD-10-CM

## 2017-02-10 DIAGNOSIS — J449 Chronic obstructive pulmonary disease, unspecified: Secondary | ICD-10-CM | POA: Diagnosis not present

## 2017-02-10 DIAGNOSIS — Z9012 Acquired absence of left breast and nipple: Secondary | ICD-10-CM

## 2017-02-10 DIAGNOSIS — D696 Thrombocytopenia, unspecified: Secondary | ICD-10-CM | POA: Diagnosis not present

## 2017-02-10 DIAGNOSIS — J90 Pleural effusion, not elsewhere classified: Secondary | ICD-10-CM

## 2017-02-10 DIAGNOSIS — Z803 Family history of malignant neoplasm of breast: Secondary | ICD-10-CM

## 2017-02-10 DIAGNOSIS — Z87820 Personal history of traumatic brain injury: Secondary | ICD-10-CM

## 2017-02-10 DIAGNOSIS — I739 Peripheral vascular disease, unspecified: Secondary | ICD-10-CM | POA: Diagnosis not present

## 2017-02-10 DIAGNOSIS — Z853 Personal history of malignant neoplasm of breast: Secondary | ICD-10-CM

## 2017-02-10 DIAGNOSIS — Z7982 Long term (current) use of aspirin: Secondary | ICD-10-CM

## 2017-02-10 DIAGNOSIS — Z90710 Acquired absence of both cervix and uterus: Secondary | ICD-10-CM

## 2017-02-10 DIAGNOSIS — Z79899 Other long term (current) drug therapy: Secondary | ICD-10-CM

## 2017-02-10 DIAGNOSIS — I10 Essential (primary) hypertension: Secondary | ICD-10-CM

## 2017-02-10 DIAGNOSIS — Z90722 Acquired absence of ovaries, bilateral: Secondary | ICD-10-CM

## 2017-02-10 DIAGNOSIS — Z87891 Personal history of nicotine dependence: Secondary | ICD-10-CM

## 2017-02-10 DIAGNOSIS — R011 Cardiac murmur, unspecified: Secondary | ICD-10-CM

## 2017-02-10 NOTE — Progress Notes (Signed)
Patient states she continues to have SOB.  She is seeing a pulmonologist on Thursday.  Patient states her appetite has been decreased also. Patient here today for CT results.

## 2017-02-12 DIAGNOSIS — J449 Chronic obstructive pulmonary disease, unspecified: Secondary | ICD-10-CM | POA: Diagnosis not present

## 2017-02-12 DIAGNOSIS — R0609 Other forms of dyspnea: Secondary | ICD-10-CM | POA: Diagnosis not present

## 2017-02-20 ENCOUNTER — Other Ambulatory Visit: Payer: PPO

## 2017-03-03 DIAGNOSIS — I10 Essential (primary) hypertension: Secondary | ICD-10-CM | POA: Diagnosis not present

## 2017-03-03 DIAGNOSIS — K219 Gastro-esophageal reflux disease without esophagitis: Secondary | ICD-10-CM | POA: Diagnosis not present

## 2017-03-03 DIAGNOSIS — F411 Generalized anxiety disorder: Secondary | ICD-10-CM | POA: Diagnosis not present

## 2017-03-03 DIAGNOSIS — E871 Hypo-osmolality and hyponatremia: Secondary | ICD-10-CM | POA: Diagnosis not present

## 2017-03-09 DIAGNOSIS — I10 Essential (primary) hypertension: Secondary | ICD-10-CM | POA: Diagnosis not present

## 2017-03-09 DIAGNOSIS — E871 Hypo-osmolality and hyponatremia: Secondary | ICD-10-CM | POA: Diagnosis not present

## 2017-05-07 DIAGNOSIS — M79674 Pain in right toe(s): Secondary | ICD-10-CM | POA: Diagnosis not present

## 2017-05-07 DIAGNOSIS — M79675 Pain in left toe(s): Secondary | ICD-10-CM | POA: Diagnosis not present

## 2017-05-07 DIAGNOSIS — B351 Tinea unguium: Secondary | ICD-10-CM | POA: Diagnosis not present

## 2017-05-26 DIAGNOSIS — M25561 Pain in right knee: Secondary | ICD-10-CM | POA: Diagnosis not present

## 2017-05-26 DIAGNOSIS — M25562 Pain in left knee: Secondary | ICD-10-CM | POA: Diagnosis not present

## 2017-05-28 DIAGNOSIS — J449 Chronic obstructive pulmonary disease, unspecified: Secondary | ICD-10-CM | POA: Diagnosis not present

## 2017-06-30 DIAGNOSIS — E871 Hypo-osmolality and hyponatremia: Secondary | ICD-10-CM | POA: Diagnosis not present

## 2017-06-30 DIAGNOSIS — I10 Essential (primary) hypertension: Secondary | ICD-10-CM | POA: Diagnosis not present

## 2017-07-07 DIAGNOSIS — K644 Residual hemorrhoidal skin tags: Secondary | ICD-10-CM | POA: Diagnosis not present

## 2017-07-07 DIAGNOSIS — F411 Generalized anxiety disorder: Secondary | ICD-10-CM | POA: Diagnosis not present

## 2017-07-07 DIAGNOSIS — I10 Essential (primary) hypertension: Secondary | ICD-10-CM | POA: Diagnosis not present

## 2017-07-07 DIAGNOSIS — K219 Gastro-esophageal reflux disease without esophagitis: Secondary | ICD-10-CM | POA: Diagnosis not present

## 2017-07-16 DIAGNOSIS — H43813 Vitreous degeneration, bilateral: Secondary | ICD-10-CM | POA: Diagnosis not present

## 2017-07-27 DIAGNOSIS — B351 Tinea unguium: Secondary | ICD-10-CM | POA: Diagnosis not present

## 2017-07-27 DIAGNOSIS — M79675 Pain in left toe(s): Secondary | ICD-10-CM | POA: Diagnosis not present

## 2017-07-27 DIAGNOSIS — M79674 Pain in right toe(s): Secondary | ICD-10-CM | POA: Diagnosis not present

## 2017-07-27 DIAGNOSIS — L600 Ingrowing nail: Secondary | ICD-10-CM | POA: Diagnosis not present

## 2017-07-30 ENCOUNTER — Other Ambulatory Visit: Payer: Self-pay | Admitting: Family Medicine

## 2017-07-30 DIAGNOSIS — Z1231 Encounter for screening mammogram for malignant neoplasm of breast: Secondary | ICD-10-CM

## 2017-07-30 DIAGNOSIS — J449 Chronic obstructive pulmonary disease, unspecified: Secondary | ICD-10-CM | POA: Diagnosis not present

## 2017-08-11 DIAGNOSIS — J069 Acute upper respiratory infection, unspecified: Secondary | ICD-10-CM | POA: Diagnosis not present

## 2017-08-11 DIAGNOSIS — I10 Essential (primary) hypertension: Secondary | ICD-10-CM | POA: Diagnosis not present

## 2017-08-11 DIAGNOSIS — J449 Chronic obstructive pulmonary disease, unspecified: Secondary | ICD-10-CM | POA: Diagnosis not present

## 2017-08-31 ENCOUNTER — Ambulatory Visit (INDEPENDENT_AMBULATORY_CARE_PROVIDER_SITE_OTHER): Payer: PPO | Admitting: Vascular Surgery

## 2017-08-31 ENCOUNTER — Encounter (INDEPENDENT_AMBULATORY_CARE_PROVIDER_SITE_OTHER): Payer: PPO

## 2017-08-31 ENCOUNTER — Other Ambulatory Visit (INDEPENDENT_AMBULATORY_CARE_PROVIDER_SITE_OTHER): Payer: Self-pay | Admitting: Vascular Surgery

## 2017-08-31 ENCOUNTER — Other Ambulatory Visit: Payer: Self-pay | Admitting: *Deleted

## 2017-08-31 DIAGNOSIS — I7090 Unspecified atherosclerosis: Secondary | ICD-10-CM

## 2017-08-31 NOTE — Patient Outreach (Signed)
[place] [place] [facility]) Care Management  08/31/2017  NEKESHIA LENHARDT 1928/03/04 [id number]  Telephone Screen  Referral Date: 08/31/17 Referral Source: Nurse Call Center Report Referral Reason: Caller states she is needing to know if Calcium is bad for older people? Insurance: HTA  Outreach attempt #1 to patient. No answer. RN CM left HIPAA compliant message along with contact info.    Plan: RN CM will contact patient with one week.   [name], RN, BSN, MHA/MSL, [place] Telephonic Care Manager Coordinator [facility] Direct Phone: [phone] Toll Free: [phone] Fax: [phone]

## 2017-09-01 ENCOUNTER — Other Ambulatory Visit: Payer: Self-pay | Admitting: *Deleted

## 2017-09-01 NOTE — Patient Outreach (Addendum)
[place] [facility]) Care Management  09/01/2017  Melanie Huffman 06/23/1928 [id number]  Telephone Screen  Referral Date: 08/31/17 Referral Source: Nurse Call Center Referral Reason: Caller states she is needing to know if Calcium is bad for older people? Insurance:HTA   Outreach attempt to patient. HIPAA identifiers verified with patient. Patient voiced having concerns regarding continuing to take her "Calcium and low dose Aspirin". Patient stated, she heard through her friends that "Calcium and Aspirin can be bad for older people". She discussed how she heard the same information about Aspirin on the TV. RN CM [place]) explained to patient that she will contact Dr. Raylene Miyamoto office and relay her concerns. Patient agreeable to plan.    Plan: RN CM will contact patient's primary MD's office.   [name], RN, BSN, MHA/MSL, [place] Telephonic Care Manager Coordinator [facility] Direct Phone: [phone] Toll Free: [phone] Fax: [phone]

## 2017-09-01 NOTE — Patient Outreach (Signed)
[place] [facility]) Care Management  09/01/2017  Melanie Huffman February 21, 1928 [id number]  Care Coordination:  Incoming call from Wells Guiles, RN at Dr. Raylene Miyamoto office. Wells Guiles reported, she spoke with Dr. Netty Starring regarding patient's concerns. New orders were prescribed for patient to stop Aspirin and to take Calcium with Vitamin D (600 mg/400 IU) two tablets per day, per Wells Guiles. RN CM clarified orders with Wells Guiles.  Plan: RN CM will contact patient with newly prescribed orders.    Updated (09/01/17 at 1041) RN CM notified patient of new orders. RN CM asked patient to write down the new orders (stop taking Aspirin and to take Calcium with Vitamin D (600 mg/400 IU) two tablets per day given by Wells Guiles, RN. Patient stated, she was taking Calcium with Vitamin D (600 mg/800 IU) two times daily. Patient verbalized being thankful for RN CM providing assistance with clarifying the medication orders.   Plan: RN CM advised patient to contact RNCM for any needs or concerns. RN CM advised patient to alert MD for any changes in conditions.  RN CM will notify [facility] CM administrative assistant regarding case closure.    [name], RN, BSN, MHA/MSL, [place] Telephonic Care Manager Coordinator [facility] Direct Phone: [phone] Toll Free: [phone] Fax: [phone]

## 2017-09-01 NOTE — Patient Outreach (Signed)
[place] [facility]) Care Management  09/01/2017  Melanie Huffman Oct 17, 1928 [id number]  Care Coordination:  RN CM contacted Dr. Raylene Miyamoto office and left a message with Olivia Mackie. RN CM informed Olivia Mackie about patient's concerns regarding continuing to take her Calcium and Aspirin. Olivia Mackie stated, she will pass the message to Dr. Raylene Miyamoto nurse.    Plan: RN CM will contact patient after patient concerns are addressed by MD/RN.  [name], RN, BSN, MHA/MSL, [place] Telephonic Care Manager Coordinator [facility] Direct Phone: [phone] Toll Free: [phone] Fax: [phone]

## 2017-09-02 ENCOUNTER — Other Ambulatory Visit (INDEPENDENT_AMBULATORY_CARE_PROVIDER_SITE_OTHER): Payer: Self-pay | Admitting: Vascular Surgery

## 2017-09-02 DIAGNOSIS — I7090 Unspecified atherosclerosis: Secondary | ICD-10-CM

## 2017-09-02 DIAGNOSIS — I739 Peripheral vascular disease, unspecified: Secondary | ICD-10-CM

## 2017-09-03 ENCOUNTER — Ambulatory Visit (INDEPENDENT_AMBULATORY_CARE_PROVIDER_SITE_OTHER): Payer: PPO | Admitting: Vascular Surgery

## 2017-09-03 ENCOUNTER — Ambulatory Visit (INDEPENDENT_AMBULATORY_CARE_PROVIDER_SITE_OTHER): Payer: PPO

## 2017-09-03 ENCOUNTER — Encounter (INDEPENDENT_AMBULATORY_CARE_PROVIDER_SITE_OTHER): Payer: Self-pay | Admitting: Vascular Surgery

## 2017-09-03 ENCOUNTER — Encounter (INDEPENDENT_AMBULATORY_CARE_PROVIDER_SITE_OTHER): Payer: Self-pay

## 2017-09-03 ENCOUNTER — Other Ambulatory Visit (INDEPENDENT_AMBULATORY_CARE_PROVIDER_SITE_OTHER): Payer: Self-pay | Admitting: Vascular Surgery

## 2017-09-03 VITALS — BP 173/82 | HR 60 | Resp 16 | Ht 65.0 in | Wt 123.0 lb

## 2017-09-03 DIAGNOSIS — I739 Peripheral vascular disease, unspecified: Secondary | ICD-10-CM | POA: Diagnosis not present

## 2017-09-03 DIAGNOSIS — M7989 Other specified soft tissue disorders: Secondary | ICD-10-CM

## 2017-09-03 DIAGNOSIS — M79661 Pain in right lower leg: Secondary | ICD-10-CM | POA: Diagnosis not present

## 2017-09-03 DIAGNOSIS — J449 Chronic obstructive pulmonary disease, unspecified: Secondary | ICD-10-CM | POA: Diagnosis not present

## 2017-09-03 DIAGNOSIS — K219 Gastro-esophageal reflux disease without esophagitis: Secondary | ICD-10-CM | POA: Diagnosis not present

## 2017-09-03 DIAGNOSIS — I10 Essential (primary) hypertension: Secondary | ICD-10-CM

## 2017-09-03 DIAGNOSIS — I7090 Unspecified atherosclerosis: Secondary | ICD-10-CM

## 2017-09-03 NOTE — Progress Notes (Signed)
MRN : [id number]  Melanie Huffman is a 83 y.o. (January 01, 1928) female who presents with chief complaint of  Chief Complaint  Patient presents with  . Follow-up    76yr. abi BLE ART DUP  .  History of Present Illness: The patient returns to the office for followup and review of the noninvasive studies. There have been no interval changes in lower extremity symptoms. No interval shortening of the patient's claudication distance or development of rest pain symptoms. No new ulcers or wounds have occurred since the last visit.  As a secondary issue that is new today the patient notes that her legs have gotten tremendously more swollen over the last week or 2. She is not really attesting to significant pain. She denies fever or chills. She denies trauma. She states she has not been increasingly short of breath. She does have water pills (she is not sure which one and it is not listed) that she uses when necessary she did take one last night but has not seen any significant decrease in her edema. Interestingly, she notes the right leg is significantly more affected than the left. There is no past history of DVT PE or superficial thrombophlebitis.  The patient denies amaurosis fugax or recent TIA symptoms. There are no recent neurological changes noted. The patient denies recent episodes of angina or shortness of breath.   ABI Rt=1.14 and Lt=1.07  Duplex ultrasound of the venous system is negative for DVT bilaterally  No outpatient prescriptions have been marked as taking for the 09/03/17 encounter (Office Visit) with Delana Meyer, Dolores Lory, MD.    Past Medical History:  Diagnosis Date  . Asthma   . Breast cancer ([place]) 1991   mastectomy left  . Cancer ([place]) 1991   L Breast  . COPD (chronic obstructive pulmonary disease) ([place])   . Heart murmur   . Hypertension   . Peripheral vascular disease ([place])   . Shortness of breath dyspnea   . Traumatic subdural hematoma [facility])     Past Surgical History:    Procedure Laterality Date  . ABDOMINAL HYSTERECTOMY     Partial  . APPENDECTOMY    . BREAST BIOPSY Right    1980 and 1970's  . BREAST SURGERY  1991   L Mastectomy  . MASTECTOMY    . ORIF PATELLA Left 04/12/2015   Procedure: OPEN REDUCTION INTERNAL (ORIF) FIXATION PATELLA;  Surgeon: Hessie Knows, MD;  Location: [facility] ORS;  Service: Orthopedics;  Laterality: Left;  . PERIPHERAL VASCULAR CATHETERIZATION Left 04/17/2015   Procedure: Lower Extremity Angiography;  Surgeon: Katha Cabal, MD;  Location: [place] [facility];  Service: Cardiovascular;  Laterality: Left;  . PERIPHERAL VASCULAR CATHETERIZATION  04/17/2015   Procedure: Lower Extremity Intervention;  Surgeon: Katha Cabal, MD;  Location: [place] [facility];  Service: Cardiovascular;;  . TONSILLECTOMY      Social History Social History  Substance Use Topics  . Smoking status: Former Smoker    Packs/day: 0.50    Years: 20.00    Types: Cigarettes    Quit date: 01/26/1979  . Smokeless tobacco: Never Used  . Alcohol use No    Family History Family History  Problem Relation Age of Onset  . CVA Mother   . Kidney disease Mother   . Heart disease Father   . Kidney disease Cousin 51  . Kidney disease Cousin 18  . Breast cancer Neg Hx     Allergies  Allergen Reactions  . Amlodipine Swelling     [  Onset: 08/26/2013]  . Codeine Nausea Only  . Sulfa Antibiotics Rash and Nausea Only     REVIEW OF SYSTEMS (Negative unless checked)  Constitutional: [] Weight loss  [] Fever  [] Chills Cardiac: [] Chest pain   [] Chest pressure   [] Palpitations   [] Shortness of breath when laying flat   [] Shortness of breath with exertion. Vascular:  [] Pain in legs with walking   [] Pain in legs at rest  [] History of DVT   [] Phlebitis   [x] Swelling in legs   [] Varicose veins   [] Non-healing ulcers Pulmonary:   [] Uses home oxygen   [] Productive cough   [] Hemoptysis   [] Wheeze  [] COPD   [] Asthma Neurologic:  [] Dizziness   [] Seizures    [] History of stroke   [] History of TIA  [] Aphasia   [] Vissual changes   [] Weakness or numbness in arm   [] Weakness or numbness in leg Musculoskeletal:   [] Joint swelling   [x] Joint pain   [] Low back pain Hematologic:  [x] Easy bruising  [] Easy bleeding   [] Hypercoagulable state   [] Anemic Gastrointestinal:  [] Diarrhea   [] Vomiting  [] Gastroesophageal reflux/heartburn   [] Difficulty swallowing. Genitourinary:  [] Chronic kidney disease   [] Difficult urination  [] Frequent urination   [] Blood in urine Skin:  [] Rashes   [] Ulcers  Psychological:  [] History of anxiety   []  History of major depression.  Physical Examination  Vitals:   09/03/17 1037  BP: (!) 173/82  Pulse: 60  Resp: 16  Weight: 55.8 kg (123 lb)  Height: 5\' 5"  (1.651 m)   Body mass index is 20.47 kg/m. Gen: WD/WN, NAD Head: [place]/AT, No temporalis wasting.  Ear/Nose/Throat: Hearing grossly intact, nares w/o erythema or drainage Eyes: PER, EOMI, sclera nonicteric.  Neck: Supple, no large masses.   Pulmonary:  Good air movement, no audible wheezing bilaterally, no use of accessory muscles.  Cardiac: RRR, no JVD Vascular: There is 4+ pitting edema with cobblestoning of her skin on the right leg this extends from the dorsum of the foot to the knee level. There is significant ecchymoses in the mid shin. At this time there are no ulcerations or weeping from any source. On the left there is 2-3+ pitting edema with mild cobblestoning. There is no erythema or induration or warmth associated with her lower extremity edema Vessel Right Left  Radial Palpable Palpable  PT Not Palpable Not Palpable  DP Not Palpable Not Palpable  Gastrointestinal: Non-distended. No guarding/no peritoneal signs.  Musculoskeletal: M/S 5/5 throughout.  No deformity or atrophy.  Neurologic: CN 2-12 intact. Symmetrical.  Speech is fluent. Motor exam as listed above. Psychiatric: Judgment intact, Mood & affect appropriate for pt's clinical situation. Dermatologic:  Moderate venous rashes no ulcers noted.  No changes consistent with cellulitis. Lymph : No lichenification or skin changes of chronic lymphedema.  CBC Lab Results  Component Value Date   WBC 4.7 02/09/2017   HGB 12.1 02/09/2017   HCT 35.4 02/09/2017   MCV 89.3 02/09/2017   PLT 140 (L) 02/09/2017    BMET    Component Value Date/Time   NA 132 (L) 02/09/2017 1230   NA 135 (L) 10/29/2014 1443   K 4.5 02/09/2017 1230   K 3.4 (L) 10/29/2014 1443   CL 97 (L) 02/09/2017 1230   CL 100 10/29/2014 1443   CO2 27 02/09/2017 1230   CO2 27 10/29/2014 1443   GLUCOSE 109 (H) 02/09/2017 1230   GLUCOSE 123 (H) 10/29/2014 1443   BUN 11 02/09/2017 1230   BUN 12 10/29/2014 1443   CREATININE 0.48  02/09/2017 1230   CREATININE 0.68 10/29/2014 1443   CALCIUM 8.7 (L) 02/09/2017 1230   CALCIUM 8.6 10/29/2014 1443   GFRNONAA >60 02/09/2017 1230   GFRNONAA >60 10/29/2014 1443   GFRNONAA >60 07/26/2014 0034   GFRAA >60 02/09/2017 1230   GFRAA >60 10/29/2014 1443   GFRAA >60 07/26/2014 0034   CrCl cannot be calculated (Patient's most recent lab result is older than the maximum 21 days allowed.).  COAG Lab Results  Component Value Date   INR 1.00 04/11/2015   INR 1.15 07/26/2014   INR 1.0 07/26/2014    Radiology No results found.  Assessment/Plan 1. PVD (peripheral vascular disease) ([place])  Recommend:  The patient has evidence of atherosclerosis of the lower extremities with claudication.  The patient does not voice lifestyle limiting changes at this point in time.  Noninvasive studies do not suggest clinically significant change.  No invasive studies, angiography or surgery at this time The patient should continue walking and begin a more formal exercise program.  The patient should continue antiplatelet therapy and aggressive treatment of the lipid abnormalities  No changes in the patient's medications at this time  The patient should continue wearing graduated compression socks  10-15 mmHg strength to control the mild edema.   - VAS [place] ABI WITH/WO TBI; Future  2. Swelling of limb Although she has had some swelling in the past there is clearly been fairly acute change. On initial examination was concerned regarding DVT especially since one leg appears more significantly affected than the other. Stat duplex ultrasound of the venous system was negative for DVT bilaterally. There may be some superficial thrombophlebitis of the small saphenous vein on the left but this is insignificant clinically.  Given these findings I have contacted Dr. Vaughan Basta lungs office and arranged for an appointment for her to be seen at 4:15 PM today.   A total of 30 minutes was spent with this patient and greater than 50% was spent in counseling and coordination of care with the patient.  Discussion included the treatment options for vascular disease including indications for surgery and intervention.  Also discussed is the appropriate timing of treatment.  In addition medical therapy was discussed.  3. Chronic obstructive pulmonary disease, unspecified COPD type ([place]) Continue pulmonary medications and aerosols as already ordered, these medications have been reviewed and there are no changes at this time.   4. Hypertension Continue antihypertensive medications as already ordered, these medications have been reviewed and there are no changes at this time.   5. Gastroesophageal reflux disease without esophagitis Continue PPI as already ordered, these medications have been reviewed and there are no changes at this time.     Hortencia Pilar, MD  09/03/2017 12:27 PM

## 2017-09-15 ENCOUNTER — Ambulatory Visit
Admission: RE | Admit: 2017-09-15 | Discharge: 2017-09-15 | Disposition: A | Discharge status: Home / Self Care | Payer: PPO | Source: Ambulatory Visit | Attending: Family Medicine | Admitting: Family Medicine

## 2017-09-15 DIAGNOSIS — Z1231 Encounter for screening mammogram for malignant neoplasm of breast: Secondary | ICD-10-CM | POA: Diagnosis not present

## 2017-09-22 DIAGNOSIS — J449 Chronic obstructive pulmonary disease, unspecified: Secondary | ICD-10-CM | POA: Diagnosis not present

## 2017-10-15 DIAGNOSIS — Z0000 Encounter for general adult medical examination without abnormal findings: Secondary | ICD-10-CM | POA: Diagnosis not present

## 2017-10-30 DIAGNOSIS — R1319 Other dysphagia: Secondary | ICD-10-CM | POA: Diagnosis not present

## 2017-11-02 ENCOUNTER — Other Ambulatory Visit: Payer: Self-pay | Admitting: Family Medicine

## 2017-11-02 DIAGNOSIS — B351 Tinea unguium: Secondary | ICD-10-CM | POA: Diagnosis not present

## 2017-11-02 DIAGNOSIS — M79675 Pain in left toe(s): Secondary | ICD-10-CM | POA: Diagnosis not present

## 2017-11-02 DIAGNOSIS — R1319 Other dysphagia: Secondary | ICD-10-CM

## 2017-11-02 DIAGNOSIS — M79674 Pain in right toe(s): Secondary | ICD-10-CM | POA: Diagnosis not present

## 2017-11-20 ENCOUNTER — Ambulatory Visit
Admission: RE | Admit: 2017-11-20 | Discharge: 2017-11-20 | Disposition: A | Discharge status: Home / Self Care | Payer: PPO | Source: Ambulatory Visit | Attending: Family Medicine | Admitting: Family Medicine

## 2017-11-20 DIAGNOSIS — R1312 Dysphagia, oropharyngeal phase: Secondary | ICD-10-CM

## 2017-11-20 DIAGNOSIS — R1319 Other dysphagia: Secondary | ICD-10-CM

## 2017-11-20 DIAGNOSIS — R1310 Dysphagia, unspecified: Secondary | ICD-10-CM | POA: Diagnosis not present

## 2017-11-20 DIAGNOSIS — T17308A Unspecified foreign body in larynx causing other injury, initial encounter: Secondary | ICD-10-CM | POA: Diagnosis not present

## 2017-11-20 NOTE — Therapy (Signed)
[place], [place], [address] Phone: [phone]   Fax:     Modified Barium Swallow  Patient Details  Name: Melanie Huffman MRN: [id number] Date of Birth: 08-18-1928 No Data Recorded  Encounter Date: 11/20/2017  End of Session - 11/20/17 1347    Visit Number  1    Number of Visits  1    Date for SLP Re-Evaluation  11/20/17    SLP Start Time  1245    SLP Stop Time   1345    SLP Time Calculation (min)  60 min    Activity Tolerance  Patient tolerated treatment well       Past Medical History:  Diagnosis Date  . Asthma   . Breast cancer ([place]) 1991   mastectomy left  . Cancer ([place]) 1991   L Breast  . COPD (chronic obstructive pulmonary disease) ([place])   . Heart murmur   . Hypertension   . Peripheral vascular disease ([place])   . Shortness of breath dyspnea   . Traumatic subdural hematoma [facility])     Past Surgical History:  Procedure Laterality Date  . ABDOMINAL HYSTERECTOMY     Partial  . APPENDECTOMY    . BREAST BIOPSY Right    1980 and 1970's  . BREAST SURGERY  1991   L Mastectomy  . MASTECTOMY    . ORIF PATELLA Left 04/12/2015   Procedure: OPEN REDUCTION INTERNAL (ORIF) FIXATION PATELLA;  Surgeon: Hessie Knows, MD;  Location: [facility] ORS;  Service: Orthopedics;  Laterality: Left;  . PERIPHERAL VASCULAR CATHETERIZATION Left 04/17/2015   Procedure: Lower Extremity Angiography;  Surgeon: Katha Cabal, MD;  Location: [place] [facility];  Service: Cardiovascular;  Laterality: Left;  . PERIPHERAL VASCULAR CATHETERIZATION  04/17/2015   Procedure: Lower Extremity Intervention;  Surgeon: Katha Cabal, MD;  Location: [place] [facility];  Service: Cardiovascular;;  . TONSILLECTOMY      There were no vitals filed for this visit.     Subjective: Patient behavior: (alertness, ability to follow instructions, etc.): the patient is alert, able to express her swallowing symptoms, and to  follow directions  Chief complaint:  Patient reports episode of regurgitation after an unusually large meal and large pills sticking in her throat   Objective:  Radiological Procedure: A videoflouroscopic evaluation of oral-preparatory, reflex initiation, and pharyngeal phases of the swallow was performed; as well as a screening of the upper esophageal phase.  I. POSTURE: Upright in MBS chair  II. VIEW: Lateral  III. COMPENSATORY STRATEGIES: Small sips- no laryngeal penetration  IV. BOLUSES ADMINISTERED:   Thin Liquid: 2 small sips, 3 rapid consecutive gulps   Nectar-thick Liquid: 1 moderate sip   Honey-thick Liquid: DNT   Puree: 2 teaspoon presentations   Mechanical Soft: 1/4 graham cracker in applesauce   Barium tablet  V. RESULTS OF EVALUATION: A. ORAL PREPARATORY PHASE: (The lips, tongue, and velum are observed for strength and coordination)       **Overall Severity Rating: Within normal limits  B. SWALLOW INITIATION/REFLEX: (The reflex is normal if "triggered" by the time the bolus reached the base of the tongue)  **Overall Severity Rating: Mild- triggers at the valleculae   C. PHARYNGEAL PHASE: (Pharyngeal function is normal if the bolus shows rapid, smooth, and continuous transit through the pharynx and there is no pharyngeal residue after the swallow)  **Overall Severity Rating: Within normal limits  D. LARYNGEAL PENETRATION: (Material entering into the laryngeal inlet/vestibule  but not aspirated) Transient- with large and rapid gulps of thin liquid  E. ASPIRATION: None  F. ESOPHAGEAL PHASE: (Screening of the upper esophagus): Did not get a good view of the cervical esophagus  ASSESSMENT:  PLAN/RECOMMENDATIONS:   A. Diet: Regular- soften and moisten as needed for comfort   B. Swallowing Precautions: Continue to crush large pills, small bites/sips, alternate liquids and solids, eat smaller more frequent meals   C. Recommended consultation to: GI   D.  Therapy recommendations: ST is not indicated at this time   E. Results and recommendations were discussed with the patient immediately following the study and the final report routed to the referring MD.    Dysphagia, oropharyngeal phase  Intermittent dysphagia - Plan: DG OP Swallowing Func-Medicare/Speech Path, DG OP Swallowing Func-Medicare/Speech Path  G-Codes - 12-05-2017 1348    Functional Assessment Tool Used  MBSS, clinical judgment    Functional Limitations  Swallowing    Swallow Current Status (Z6629)  At least 1 percent but less than 20 percent impaired, limited or restricted    Swallow Goal Status (U7654)  At least 1 percent but less than 20 percent impaired, limited or restricted    Swallow Discharge Status [phone])  At least 1 percent but less than 20 percent impaired, limited or restricted           Problem List Patient Active Problem List   Diagnosis Date Noted  . Swelling of limb 09/03/2017  . Mass of upper lobe of left lung 11/09/2016  . Uncontrolled hypertension 10/31/2016  . PVD (peripheral vascular disease) ([place]) 10/31/2016  . COPD (chronic obstructive pulmonary disease) ([place]) 10/31/2016  . GERD (gastroesophageal reflux disease) 10/31/2016  . Hyponatremia 07/10/2016  . Dehydration 07/10/2016  . Acute renal insufficiency 07/10/2016  . Hypokalemia 07/10/2016  . Cellulitis of leg, left 07/09/2016  . Patellar fracture 04/11/2015  . Subdural hematoma ([place]) 07/26/2014   Leroy Sea, MS/CCC- SLP  [name] 12/05/17, 1:49 PM  [facility] [place], [place], [address] Phone: [phone]   Fax:     Name: Melanie Huffman MRN: [id number] Date of Birth: August 31, 1928

## 2017-11-21 DIAGNOSIS — J441 Chronic obstructive pulmonary disease with (acute) exacerbation: Secondary | ICD-10-CM | POA: Diagnosis not present

## 2017-12-01 DIAGNOSIS — M1990 Unspecified osteoarthritis, unspecified site: Secondary | ICD-10-CM

## 2017-12-01 HISTORY — DX: Unspecified osteoarthritis, unspecified site: M19.90

## 2017-12-03 DIAGNOSIS — K644 Residual hemorrhoidal skin tags: Secondary | ICD-10-CM | POA: Diagnosis not present

## 2017-12-03 DIAGNOSIS — I10 Essential (primary) hypertension: Secondary | ICD-10-CM | POA: Diagnosis not present

## 2017-12-03 DIAGNOSIS — K219 Gastro-esophageal reflux disease without esophagitis: Secondary | ICD-10-CM | POA: Diagnosis not present

## 2017-12-09 DIAGNOSIS — I10 Essential (primary) hypertension: Secondary | ICD-10-CM | POA: Diagnosis not present

## 2017-12-09 DIAGNOSIS — Z0000 Encounter for general adult medical examination without abnormal findings: Secondary | ICD-10-CM | POA: Diagnosis not present

## 2017-12-09 DIAGNOSIS — E871 Hypo-osmolality and hyponatremia: Secondary | ICD-10-CM | POA: Diagnosis not present

## 2017-12-29 DIAGNOSIS — R1310 Dysphagia, unspecified: Secondary | ICD-10-CM | POA: Diagnosis not present

## 2017-12-29 DIAGNOSIS — K219 Gastro-esophageal reflux disease without esophagitis: Secondary | ICD-10-CM | POA: Diagnosis not present

## 2017-12-29 DIAGNOSIS — R197 Diarrhea, unspecified: Secondary | ICD-10-CM | POA: Diagnosis not present

## 2017-12-31 ENCOUNTER — Other Ambulatory Visit: Payer: Self-pay | Admitting: Student

## 2017-12-31 DIAGNOSIS — J449 Chronic obstructive pulmonary disease, unspecified: Secondary | ICD-10-CM | POA: Diagnosis not present

## 2017-12-31 DIAGNOSIS — R1310 Dysphagia, unspecified: Secondary | ICD-10-CM

## 2017-12-31 DIAGNOSIS — R0609 Other forms of dyspnea: Secondary | ICD-10-CM | POA: Diagnosis not present

## 2018-01-07 ENCOUNTER — Ambulatory Visit (HOSPITAL_COMMUNITY): Admission: RE | Admit: 2018-01-07 | Payer: PPO | Source: Ambulatory Visit

## 2018-01-07 ENCOUNTER — Ambulatory Visit
Admission: RE | Admit: 2018-01-07 | Discharge: 2018-01-07 | Disposition: A | Discharge status: Home / Self Care | Payer: PPO | Source: Ambulatory Visit | Attending: Student | Admitting: Student

## 2018-01-07 DIAGNOSIS — K219 Gastro-esophageal reflux disease without esophagitis: Secondary | ICD-10-CM | POA: Diagnosis not present

## 2018-01-07 DIAGNOSIS — K449 Diaphragmatic hernia without obstruction or gangrene: Secondary | ICD-10-CM | POA: Diagnosis not present

## 2018-01-07 DIAGNOSIS — R1310 Dysphagia, unspecified: Secondary | ICD-10-CM | POA: Insufficient documentation

## 2018-01-27 IMAGING — DX DG CHEST 1V PORT
1 series · 1 of 1 positions shown · non-contrast
Comparison: Single-view of the chest 04/12/2015.

CLINICAL DATA: Upper abdominal plain over the past 2-3 days.

EXAM:
PORTABLE CHEST 1 VIEW

[chest ap]
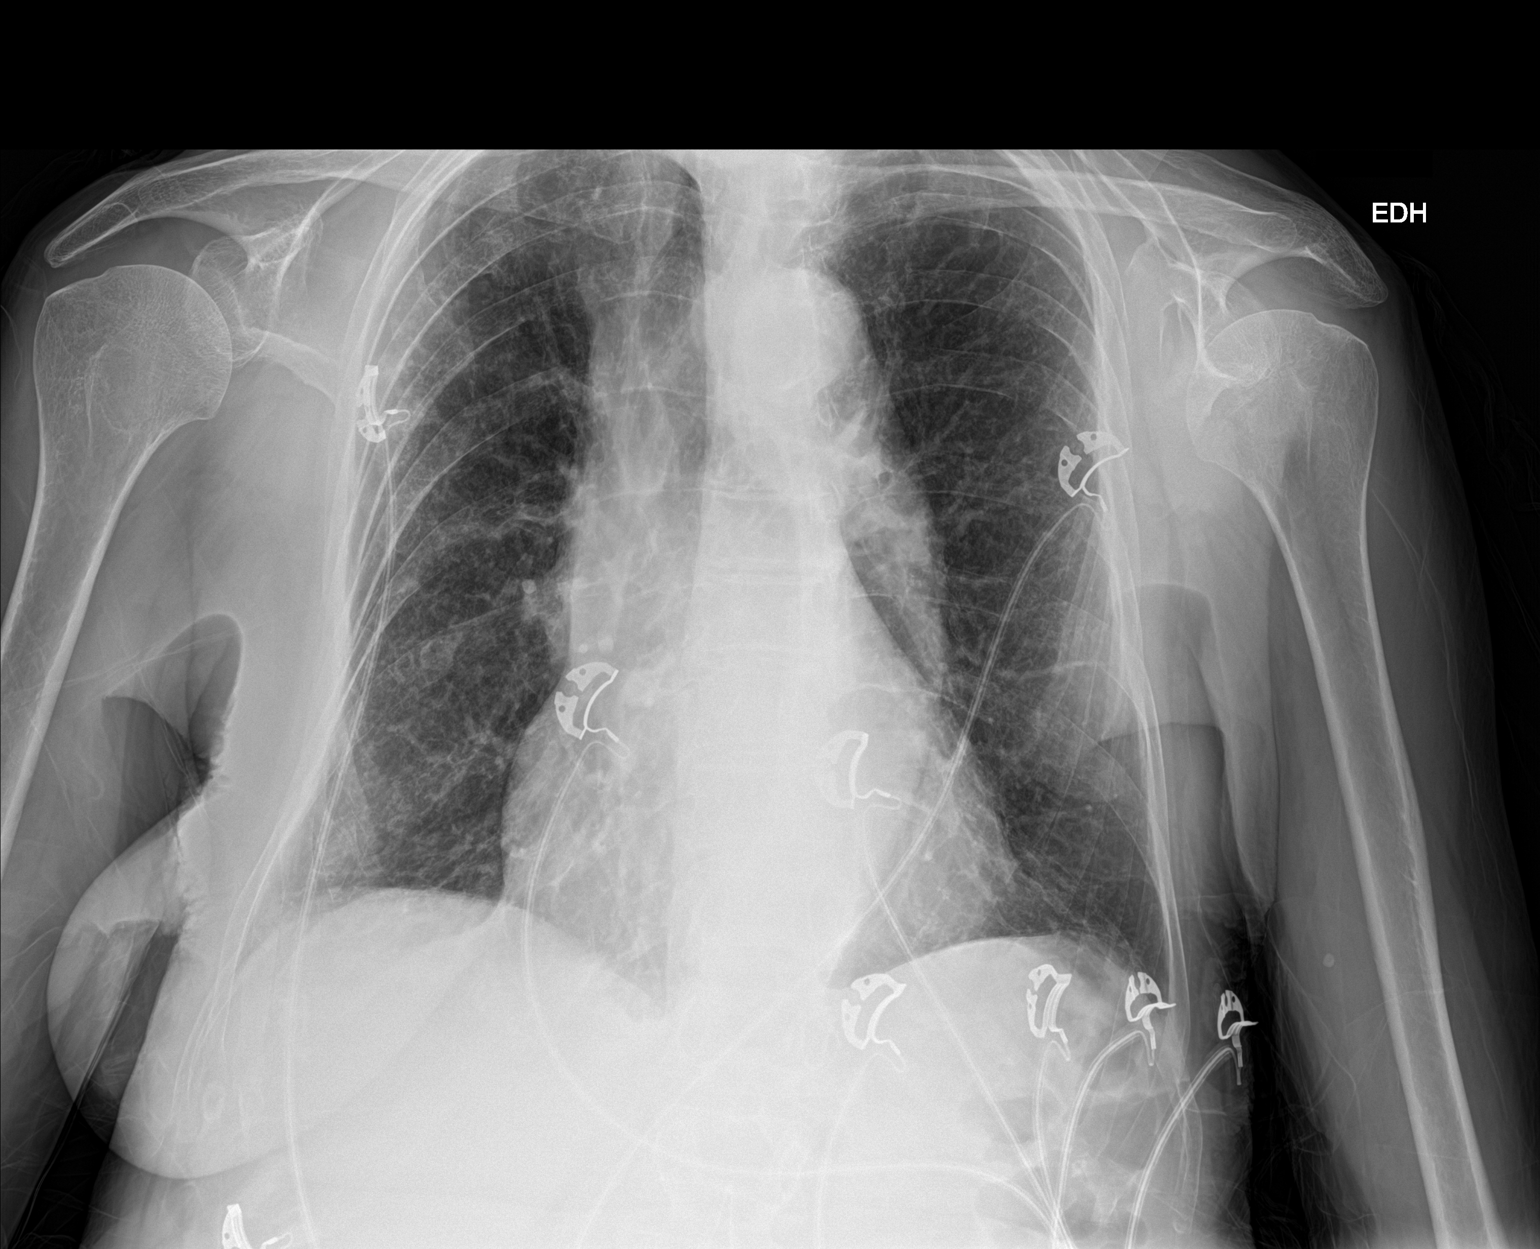

[1 of 1 positions shown; findings below may reference images not displayed]

FINDINGS: The chest is hyperexpanded but the lungs are clear. Heart size is
upper normal. No pneumothorax or pleural effusion. No focal bony
abnormality.
IMPRESSION: No acute disease.

## 2018-02-15 DIAGNOSIS — M79675 Pain in left toe(s): Secondary | ICD-10-CM | POA: Diagnosis not present

## 2018-02-15 DIAGNOSIS — L600 Ingrowing nail: Secondary | ICD-10-CM | POA: Diagnosis not present

## 2018-02-15 DIAGNOSIS — M79674 Pain in right toe(s): Secondary | ICD-10-CM | POA: Diagnosis not present

## 2018-02-15 DIAGNOSIS — B351 Tinea unguium: Secondary | ICD-10-CM | POA: Diagnosis not present

## 2018-02-16 IMAGING — CT CT CHEST W/O CM
2 of 4 series · 13 of 36 positions shown, 16 images · non-contrast
Comparison: Chest radiograph, 11/01/2016.

CLINICAL DATA: PMHx of Asthma, left breast cancer status post
mastectomy, COPD, hypertension, peripheral vascular disease, history
of subdural hematoma, who was admitted to [HOSPITAL] on 10/30/2016 for
evaluation of shortness of breath. Patient had been seen by her
primary care physician recently for upper respiratory infection. The
patient has been having progressive shortness of breath. We are now
asked evaluate her for worsening Alsharef Attas note

EXAM:
CT CHEST WITHOUT CONTRAST
TECHNIQUE: Multidetector CT imaging of the chest was performed following the
standard protocol without IV contrast.

[Series 5: coronal · coronal · 0.59mm/px · 3 of 119 slices shown]
[im 24/119  lung]
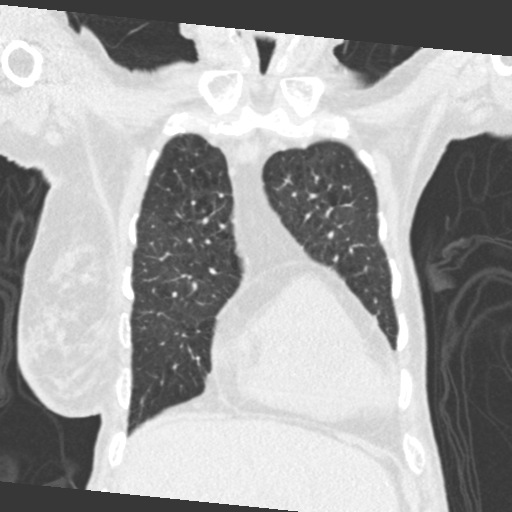
[im 48/119  lung]
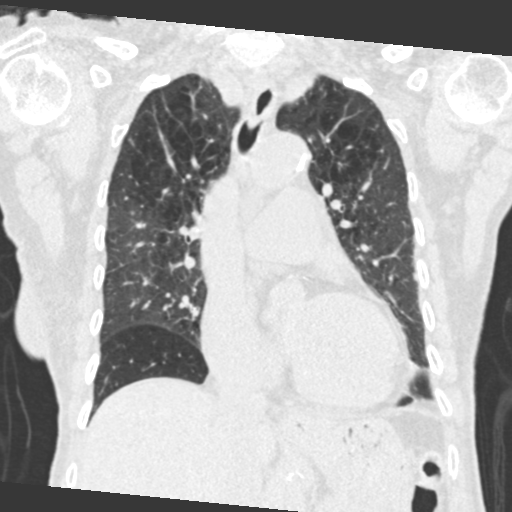
[im 71/119  lung]
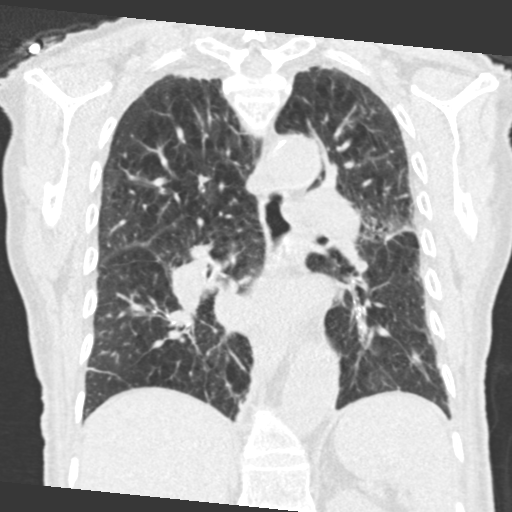

[Series 7: true axial · axial · 0.50mm/px · z∈[-865,-608]mm · 10 of 151 slices shown, 13 images]
[im 11/151  mediastinal]
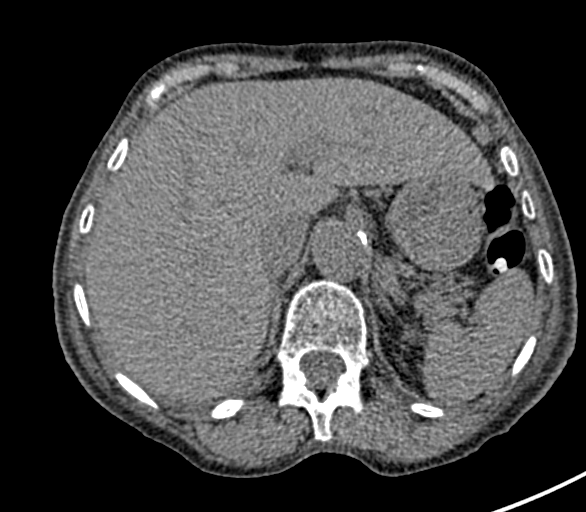
[im 11/151  lung]
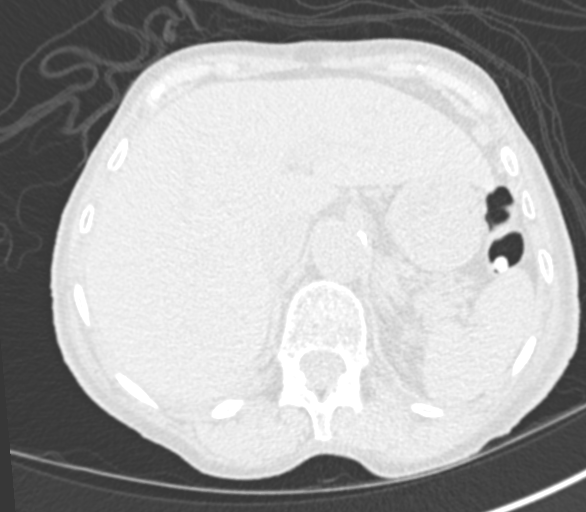
[im 22/151  lung]
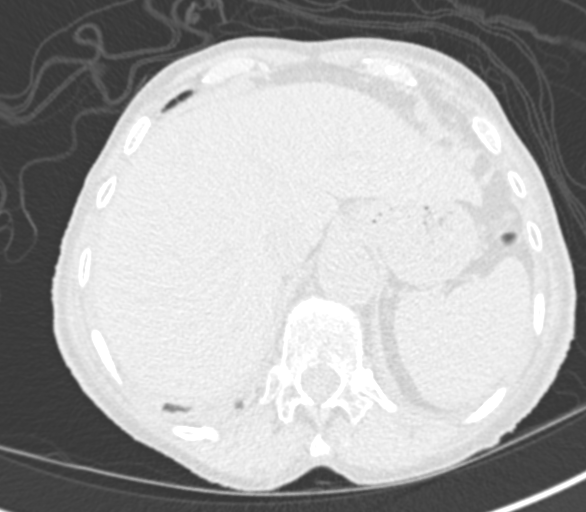
[im 43/151  lung]
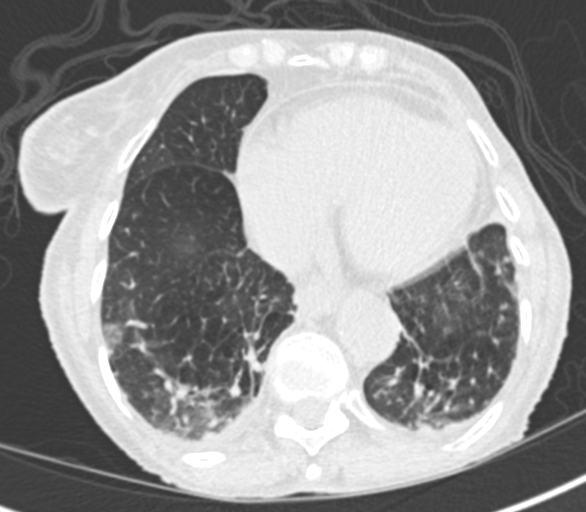
[im 54/151  lung]
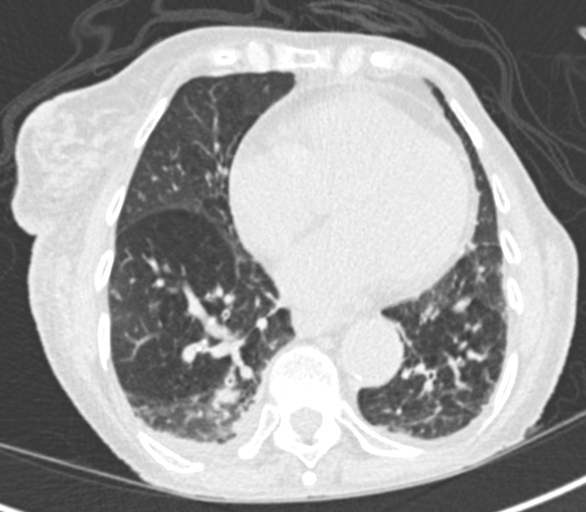
[im 65/151  mediastinal]
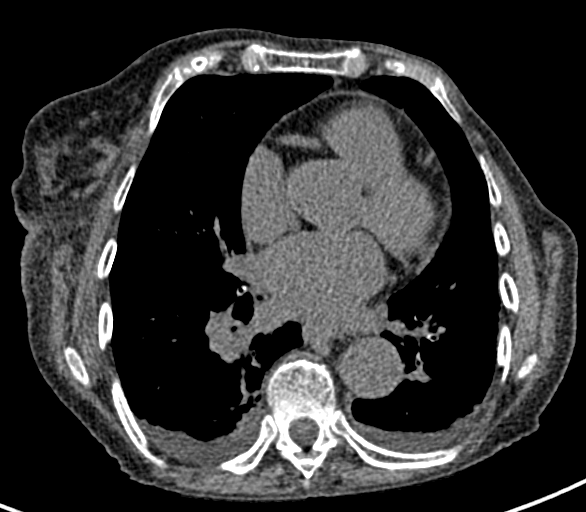
[im 65/151  lung]
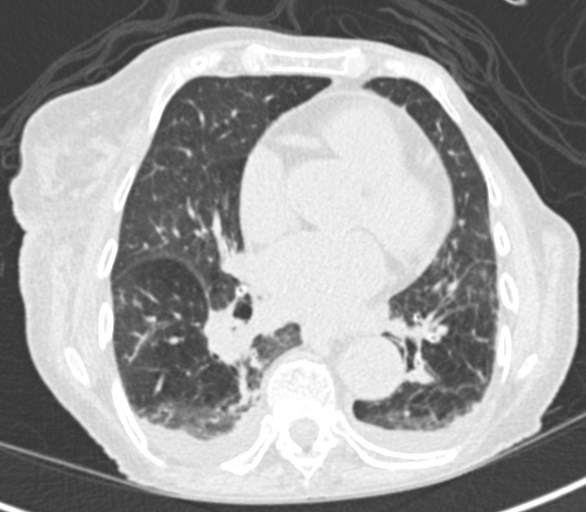
[im 86/151  lung]
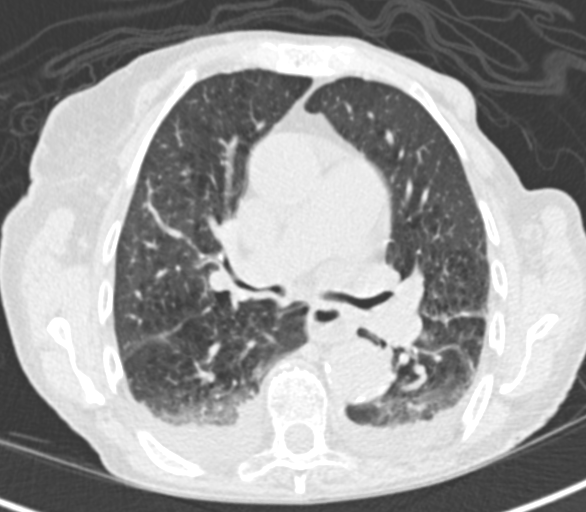
[im 97/151  lung]
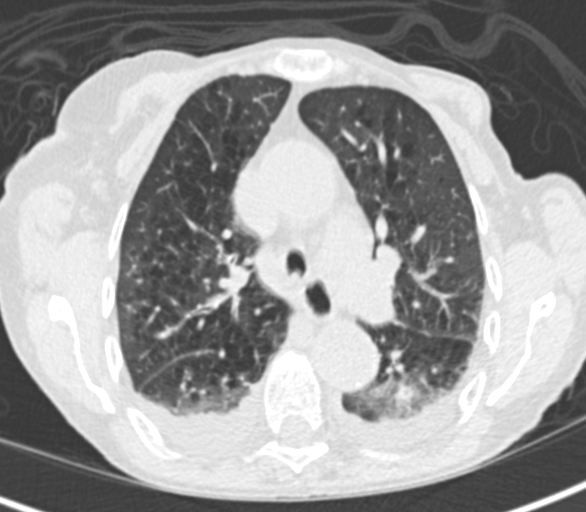
[im 108/151  lung]
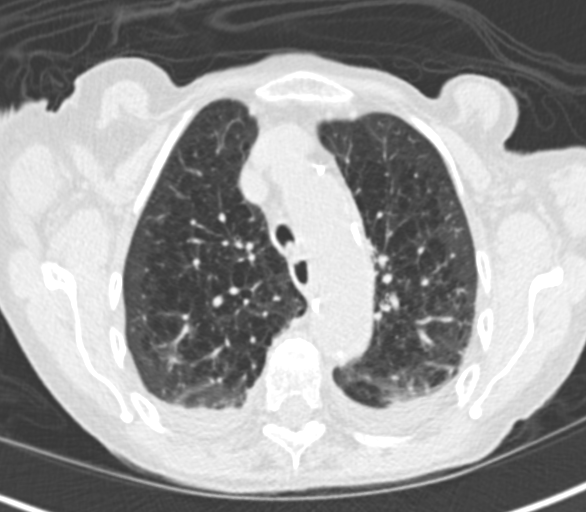
[im 129/151  mediastinal]
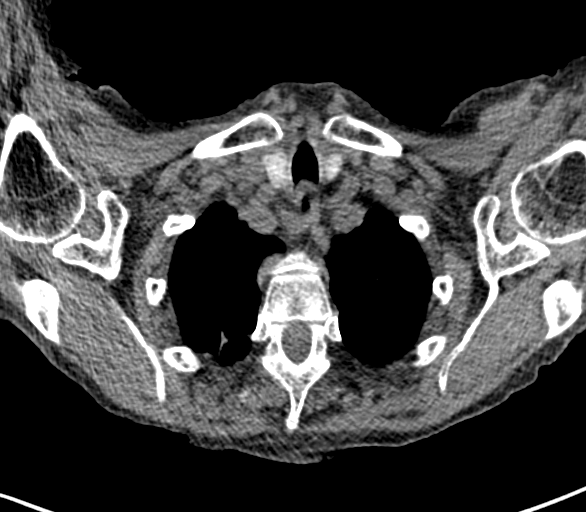
[im 129/151  lung]
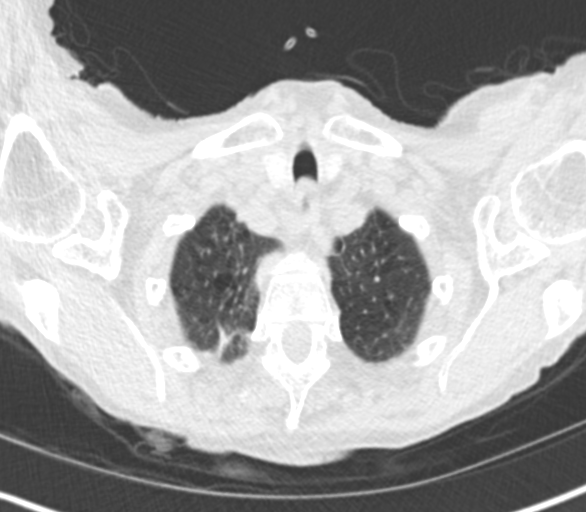
[im 140/151  lung]
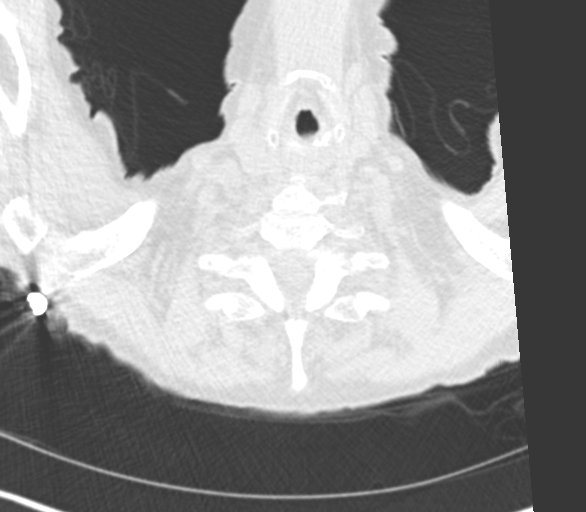

[13 of 36 positions shown; findings below may reference images not displayed]

FINDINGS: Cardiovascular: Heart is mildly enlarged. There are mild coronary
artery calcifications. The aorta is normal in caliber.
Atherosclerotic calcifications are noted along the thoracic aorta
and at the origin of the branch vessels. There is mild enlargement
of the right and left pulmonary arteries both measuring 2.8 cm.

Mediastinum/Nodes: No mediastinal or hilar masses. No discrete
enlarged lymph nodes. Mild dilation of the esophagus. Trachea is
unremarkable.

Lungs/Pleura: Small bilateral pleural effusions. Advanced changes of
centrilobular emphysema. There is bronchial wall thickening most
evident in the lower lobes, seen to a lesser degree in the upper
lobes and right middle lobe. Mild lower lung zone interstitial
thickening. There is an irregular focal area of opacity in the
posterior left upper lobe adjacent to the oblique fissure, centered
on image 62, series 3, measuring an average of 1 cm in size. In the
posterior, dependent lower lobes, there is hazy and reticular type
opacities, which is likely atelectasis. Right greater than left
apical pleural parenchymal scarring is noted. No pneumothorax.

Upper Abdomen: No acute abnormality.

Musculoskeletal: Old sternal fracture. Bones are demineralized. No
osteoblastic or osteolytic lesions.

Changes from a left mastectomy.  No chest wall mass.
IMPRESSION: 1. Small bilateral pleural effusions, mild dependent lower lobe
atelectasis and mild interstitial thickening. Consider mild
congestive heart failure superimposed on changes of advanced
centrilobular emphysema.
2. There is also bronchial wall thickening that is most prominent in
the lower lobes. This may all be chronic. Acute bronchial
inflammation/infection should be considered in the proper clinical
setting.
3. No convincing pneumonia.
4. 1 cm irregular nodular opacity in the left upper lobe. Consider
one of the following in 3 months for both low-risk and high-risk
individuals: (a) repeat chest CT, (b) follow-up PET-CT, or (c)
tissue sampling. This recommendation follows the consensus
statement: Guidelines for Management of Incidental Pulmonary Nodules
Detected on CT Images: From the [HOSPITAL] 8369; Radiology
8369; [DATE]. Since this may be atelectasis or inflammatory in
origin, follow-up chest CT in 3 months is favored.
5. Mild enlargement of the pulmonary arteries consistent with
pulmonary hypertension.

## 2018-03-07 ENCOUNTER — Emergency Department: Payer: PPO

## 2018-03-07 ENCOUNTER — Inpatient Hospital Stay
Admission: EM | Admit: 2018-03-07 | Discharge: 2018-03-09 | DRG: 291 | Disposition: A | Discharge status: Home / Self Care | Payer: PPO | Attending: Internal Medicine | Admitting: Internal Medicine

## 2018-03-07 ENCOUNTER — Encounter: Payer: Self-pay | Admitting: Emergency Medicine

## 2018-03-07 DIAGNOSIS — I5023 Acute on chronic systolic (congestive) heart failure: Secondary | ICD-10-CM | POA: Diagnosis present

## 2018-03-07 DIAGNOSIS — E876 Hypokalemia: Secondary | ICD-10-CM | POA: Diagnosis not present

## 2018-03-07 DIAGNOSIS — I509 Heart failure, unspecified: Secondary | ICD-10-CM | POA: Diagnosis not present

## 2018-03-07 DIAGNOSIS — I10 Essential (primary) hypertension: Secondary | ICD-10-CM | POA: Diagnosis not present

## 2018-03-07 DIAGNOSIS — I739 Peripheral vascular disease, unspecified: Secondary | ICD-10-CM | POA: Diagnosis present

## 2018-03-07 DIAGNOSIS — Z853 Personal history of malignant neoplasm of breast: Secondary | ICD-10-CM

## 2018-03-07 DIAGNOSIS — R51 Headache: Secondary | ICD-10-CM | POA: Diagnosis present

## 2018-03-07 DIAGNOSIS — Z885 Allergy status to narcotic agent status: Secondary | ICD-10-CM

## 2018-03-07 DIAGNOSIS — I160 Hypertensive urgency: Secondary | ICD-10-CM | POA: Diagnosis present

## 2018-03-07 DIAGNOSIS — I110 Hypertensive heart disease with heart failure: Principal | ICD-10-CM | POA: Diagnosis present

## 2018-03-07 DIAGNOSIS — I6782 Cerebral ischemia: Secondary | ICD-10-CM | POA: Diagnosis not present

## 2018-03-07 DIAGNOSIS — J439 Emphysema, unspecified: Secondary | ICD-10-CM | POA: Diagnosis present

## 2018-03-07 DIAGNOSIS — K219 Gastro-esophageal reflux disease without esophagitis: Secondary | ICD-10-CM | POA: Diagnosis not present

## 2018-03-07 DIAGNOSIS — I5021 Acute systolic (congestive) heart failure: Secondary | ICD-10-CM | POA: Diagnosis not present

## 2018-03-07 DIAGNOSIS — J9601 Acute respiratory failure with hypoxia: Secondary | ICD-10-CM | POA: Diagnosis not present

## 2018-03-07 DIAGNOSIS — Z87891 Personal history of nicotine dependence: Secondary | ICD-10-CM

## 2018-03-07 DIAGNOSIS — Z7982 Long term (current) use of aspirin: Secondary | ICD-10-CM | POA: Diagnosis not present

## 2018-03-07 DIAGNOSIS — Z9012 Acquired absence of left breast and nipple: Secondary | ICD-10-CM | POA: Diagnosis not present

## 2018-03-07 DIAGNOSIS — Z882 Allergy status to sulfonamides status: Secondary | ICD-10-CM | POA: Diagnosis not present

## 2018-03-07 DIAGNOSIS — J449 Chronic obstructive pulmonary disease, unspecified: Secondary | ICD-10-CM | POA: Diagnosis present

## 2018-03-07 DIAGNOSIS — Z888 Allergy status to other drugs, medicaments and biological substances status: Secondary | ICD-10-CM

## 2018-03-07 DIAGNOSIS — Z7951 Long term (current) use of inhaled steroids: Secondary | ICD-10-CM

## 2018-03-07 DIAGNOSIS — Z8679 Personal history of other diseases of the circulatory system: Secondary | ICD-10-CM

## 2018-03-07 LAB — BASIC METABOLIC PANEL
Anion gap: 7 (ref 5–15)
BUN: 11 mg/dL (ref 6–20)
CALCIUM: 8.9 mg/dL (ref 8.9–10.3)
CO2: 28 mmol/L (ref 22–32)
Chloride: 97 mmol/L — ABNORMAL LOW (ref 101–111)
Creatinine, Ser: 0.75 mg/dL (ref 0.44–1.00)
GFR calc Af Amer: >60 mL/min (ref >60)
GLUCOSE: 116 mg/dL — AB (ref 65–99)
Potassium: 3.8 mmol/L (ref 3.5–5.1)
Sodium: 132 mmol/L — ABNORMAL LOW (ref 135–145)

## 2018-03-07 LAB — CBC
HEMATOCRIT: 43 % (ref 35.0–47.0)
Hemoglobin: 14.4 g/dL (ref 12.0–16.0)
MCH: 30.4 pg (ref 26.0–34.0)
MCHC: 33.6 g/dL (ref 32.0–36.0)
MCV: 90.5 fL (ref 80.0–100.0)
Platelets: 133 10*3/uL — ABNORMAL LOW (ref 150–440)
RBC: 4.75 MIL/uL (ref 3.80–5.20)
RDW: 13.5 % (ref 11.5–14.5)
WBC: 5.1 10*3/uL (ref 3.6–11.0)

## 2018-03-07 LAB — TROPONIN I: Troponin I: <0.03 ng/mL (ref <0.03)

## 2018-03-07 MED ORDER — FUROSEMIDE 10 MG/ML IJ SOLN
20.0000 mg | Freq: Once | INTRAMUSCULAR | Status: AC
  Administered 2018-03-07: 20 mg via INTRAVENOUS
  Filled 2018-03-07: qty 4

## 2018-03-07 MED ORDER — IPRATROPIUM-ALBUTEROL 0.5-2.5 (3) MG/3ML IN SOLN
RESPIRATORY_TRACT | Status: AC
  Administered 2018-03-07: 3 mL via RESPIRATORY_TRACT
  Filled 2018-03-07: qty 3

## 2018-03-07 MED ORDER — ALBUTEROL SULFATE (2.5 MG/3ML) 0.083% IN NEBU
INHALATION_SOLUTION | RESPIRATORY_TRACT | Status: AC
  Administered 2018-03-07: 5 mg via RESPIRATORY_TRACT
  Filled 2018-03-07: qty 6

## 2018-03-07 MED ORDER — ALBUTEROL SULFATE (2.5 MG/3ML) 0.083% IN NEBU
5.0000 mg | INHALATION_SOLUTION | Freq: Once | RESPIRATORY_TRACT | Status: AC
  Administered 2018-03-07: 5 mg via RESPIRATORY_TRACT

## 2018-03-07 MED ORDER — IPRATROPIUM-ALBUTEROL 0.5-2.5 (3) MG/3ML IN SOLN
3.0000 mL | Freq: Once | RESPIRATORY_TRACT | Status: AC
  Administered 2018-03-07: 3 mL via RESPIRATORY_TRACT

## 2018-03-07 NOTE — H&P (Signed)
[facility] NAME: Melanie Huffman    MR#:  [id number]  DATE OF BIRTH:  [birth year]  DATE OF ADMISSION:  03/07/2018  PRIMARY CARE PHYSICIAN: Dion Body, MD   REQUESTING/REFERRING PHYSICIAN: Joni Fears, MD  CHIEF COMPLAINT:   Chief Complaint  Patient presents with  . Hypertension    HISTORY OF PRESENT ILLNESS:  Melanie Huffman  is a 84 y.o. female who presents with acutely elevated blood pressure, and subsequent shortness of breath.  Patient states that she came in because she noticed that her blood pressure was in the 016W systolic.  She has been feeling short of breath throughout the day.  Her blood pressure has been elevated over the weekend, and she has been in communication with her primary care physician and taking some extra antihypertensives, to which it has previously responded.  However, today her blood pressure was not responding to additional medications so she came to the ED.  Here she is found to have mild vascular congestion on chest x-ray, suggesting perhaps some acute CHF, and persistently elevated blood pressure despite antihypertensives in the ED.  Hospitalist were called for admission  PAST MEDICAL HISTORY:   Past Medical History:  Diagnosis Date  . Asthma   . Breast cancer ([place]) 1991   mastectomy left  . Cancer ([place]) 1991   L Breast  . COPD (chronic obstructive pulmonary disease) ([place])   . Heart murmur   . Hypertension   . Peripheral vascular disease ([place])   . Shortness of breath dyspnea   . Traumatic subdural hematoma (H[facility])      PAST SURGICAL HISTORY:   Past Surgical History:  Procedure Laterality Date  . ABDOMINAL HYSTERECTOMY     Partial  . APPENDECTOMY    . BREAST BIOPSY Right    1980 and 1970's  . BREAST SURGERY  1991   L Mastectomy  . MASTECTOMY    . ORIF PATELLA Left 04/12/2015   Procedure: OPEN REDUCTION INTERNAL (ORIF) FIXATION PATELLA;  Surgeon: Hessie Knows, MD;  Location: [facility]  ORS;  Service: Orthopedics;  Laterality: Left;  . PERIPHERAL VASCULAR CATHETERIZATION Left 04/17/2015   Procedure: Lower Extremity Angiography;  Surgeon: Katha Cabal, MD;  Location: [place] [facility];  Service: Cardiovascular;  Laterality: Left;  . PERIPHERAL VASCULAR CATHETERIZATION  04/17/2015   Procedure: Lower Extremity Intervention;  Surgeon: Katha Cabal, MD;  Location: [place] [facility];  Service: Cardiovascular;;  . TONSILLECTOMY       SOCIAL HISTORY:   Social History   Tobacco Use  . Smoking status: Former Smoker    Packs/day: 0.50    Years: 20.00    Pack years: 10.00    Types: Cigarettes    Last attempt to quit: 01/26/1979    Years since quitting: 39.1  . Smokeless tobacco: Never Used  Substance Use Topics  . Alcohol use: No     FAMILY HISTORY:   Family History  Problem Relation Age of Onset  . CVA Mother   . Kidney disease Mother   . Heart disease Father   . Kidney disease Cousin 34  . Kidney disease Cousin 36  . Breast cancer Neg Hx      DRUG ALLERGIES:   Allergies  Allergen Reactions  . Amlodipine Swelling     [Onset: 08/26/2013]  . Codeine Nausea Only  . Sulfa Antibiotics Rash and Nausea Only    MEDICATIONS AT HOME:   Prior to Admission medications   Medication  Sig Start Date End Date Taking? Authorizing Provider  acetaminophen (TYLENOL) 325 MG tablet Take 2 tablets (650 mg total) by mouth every 6 (six) hours as needed for mild pain (or Fever >/= 101). 04/16/15   Reche Dixon, PA-C  Ascorbic Acid (VITAMIN C PO) Take 1 capsule by mouth daily.    [provider]  aspirin EC 81 MG tablet Take 81 mg by mouth daily.    [provider]  Calcium Carbonate-Vitamin D (CALCIUM + D PO) Take 1 tablet by mouth 2 (two) times daily.    [provider]  carvedilol (COREG) 25 MG tablet Take 25 mg by mouth 2 (two) times daily with a meal.    [provider]  cloNIDine (CATAPRES) 0.1 MG tablet Take 1 tablet (0.1 mg  total) by mouth 2 (two) times daily. 11/04/16 11/04/17  Fritzi Mandes, MD  Fluticasone-Salmeterol (ADVAIR DISKUS) 250-50 MCG/DOSE AEPB Inhale 1 puff into the lungs 2 (two) times daily. 06/12/16   [provider]  guaiFENesin (MUCINEX) 600 MG 12 hr tablet Take 1 tablet (600 mg total) by mouth 2 (two) times daily. 11/04/16   Fritzi Mandes, MD  mirtazapine (REMERON) 7.5 MG tablet Take 1 tablet (7.5 mg total) by mouth at bedtime. 11/04/16   Fritzi Mandes, MD  Multiple Vitamins-Minerals (CENTRUM SILVER PO) Take 1 tablet by mouth daily.    [provider]  omeprazole (PRILOSEC) 40 MG capsule Take 40 mg by mouth daily.     [provider]  potassium chloride SA (K-DUR,KLOR-CON) 20 MEQ tablet Take 20 mEq by mouth 2 (two) times daily.     [provider]  predniSONE (DELTASONE) 20 MG tablet Take 20 mg by mouth daily with breakfast.    [provider]  raloxifene (EVISTA) 60 MG tablet Take 60 mg by mouth daily.    [provider]  tiotropium (SPIRIVA) 18 MCG inhalation capsule Place 18 mcg into inhaler and inhale daily.     [provider]  traZODone (DESYREL) 50 MG tablet Take 50 mg by mouth at bedtime as needed for sleep.    [provider]    REVIEW OF SYSTEMS:  Review of Systems  Constitutional: Negative for chills, fever, malaise/fatigue and weight loss.  HENT: Negative for ear pain, hearing loss and tinnitus.   Eyes: Negative for blurred vision, double vision, pain and redness.  Respiratory: Positive for shortness of breath. Negative for cough and hemoptysis.   Cardiovascular: Negative for chest pain, palpitations, orthopnea and leg swelling.  Gastrointestinal: Negative for abdominal pain, constipation, diarrhea, nausea and vomiting.  Genitourinary: Negative for dysuria, frequency and hematuria.  Musculoskeletal: Negative for back pain, joint pain and neck pain.  Skin:       No acne, rash, or lesions  Neurological: Negative for  dizziness, tremors, focal weakness and weakness.  Endo/Heme/Allergies: Negative for polydipsia. Does not bruise/bleed easily.  Psychiatric/Behavioral: Negative for depression. The patient is not nervous/anxious and does not have insomnia.      VITAL SIGNS:   Vitals:   03/07/18 2215 03/07/18 2239 03/07/18 2245 03/07/18 2248  BP:      Pulse: 81 85 72 76  Resp:      Temp:      SpO2: 95% (!) 88% 92% 95%  Weight:      Height:       Wt Readings from Last 3 Encounters:  03/07/18 52.2 kg (115 lb)  09/03/17 55.8 kg (123 lb)  02/10/17 55 kg (121 lb 4.1 oz)  PHYSICAL EXAMINATION:  Physical Exam  Vitals reviewed. Constitutional: She is oriented to person, place, and time. She appears well-developed and well-nourished. No distress.  HENT:  Head: Normocephalic and atraumatic.  Mouth/Throat: Oropharynx is clear and moist.  Eyes: Pupils are equal, round, and reactive to light. Conjunctivae and EOM are normal. No scleral icterus.  Neck: Normal range of motion. Neck supple. No JVD present. No thyromegaly present.  Cardiovascular: Normal rate, regular rhythm and intact distal pulses. Exam reveals no gallop and no friction rub.  No murmur heard. Respiratory: Effort normal. No respiratory distress. She has no wheezes. She has rales (Fine, bilateral).  GI: Soft. Bowel sounds are normal. She exhibits no distension. There is no tenderness.  Musculoskeletal: Normal range of motion. She exhibits no edema.  No arthritis, no gout  Lymphadenopathy:    She has no cervical adenopathy.  Neurological: She is alert and oriented to person, place, and time. No cranial nerve deficit.  No dysarthria, no aphasia  Skin: Skin is warm and dry. No rash noted. No erythema.  Psychiatric: She has a normal mood and affect. Her behavior is normal. Judgment and thought content normal.    LABORATORY PANEL:   CBC Recent Labs  Lab 03/07/18 2016  WBC 5.1  HGB 14.4  HCT 43.0  PLT 133*    ------------------------------------------------------------------------------------------------------------------  Chemistries  Recent Labs  Lab 03/07/18 2016  NA 132*  K 3.8  CL 97*  CO2 28  GLUCOSE 116*  BUN 11  CREATININE 0.75  CALCIUM 8.9   ------------------------------------------------------------------------------------------------------------------  Cardiac Enzymes Recent Labs  Lab 03/07/18 2016  TROPONINI <0.03   ------------------------------------------------------------------------------------------------------------------  RADIOLOGY:  Dg Chest 2 View  Result Date: 03/07/2018 CLINICAL DATA:  Hypertension and dyspnea EXAM: CHEST - 2 VIEW COMPARISON:  11/02/2016 CXR FINDINGS: Pulmonary vascular engorgement consistent with mild CHF with stable cardiomegaly and aortic atherosclerosis. Kerley B-lines are noted bilaterally. No effusion or pneumothorax. No acute osseous abnormality. Degenerative changes are seen along the dorsal spine. IMPRESSION: Cardiomegaly with mild CHF. Aortic atherosclerosis without aneurysm. Electronically Signed   By: Ashley Royalty M.D.   On: 03/07/2018 21:10   Ct Head Wo Contrast  Result Date: 03/07/2018 CLINICAL DATA:  Worsening hypertension since Friday. EXAM: CT HEAD WITHOUT CONTRAST TECHNIQUE: Contiguous axial images were obtained from the base of the skull through the vertex without intravenous contrast. COMPARISON:  07/25/2014 FINDINGS: Brain: Left parafalcine calcification is identified at site of suspected parafalcine hemorrhage in 2015. Findings favor a calcifying parafalcine meningioma measuring approximately 6 mm in diameter. No localized edema. No midline shift. Sulcal and ventricular prominence consistent with atrophy with chronic small vessel ischemia. No acute intra-axial hemorrhage, midline shift or edema. No extra-axial fluid collections. Fourth ventricle and basal cisterns are midline. Vascular: No hyperdense vessel sign. Skull: No  skull fracture or suspicious osseous lesions. Sinuses/Orbits: Clear bilateral mastoids. Intact orbits. Bilateral cataract extractions. No acute sinus disease. Other: None IMPRESSION: Atrophy with chronic small vessel ischemia. No acute intracranial abnormality. Left parafalcine calcification at site of suspected prior parafalcine hemorrhage is now more likely to have represented a parafalcine calcifying meningioma in retrospect, now measuring 6 mm. Electronically Signed   By: Ashley Royalty M.D.   On: 03/07/2018 22:51    EKG:   Orders placed or performed during the hospital encounter of 03/07/18  . ED EKG within 10 minutes  . ED EKG within 10 minutes  . EKG 12-Lead  . EKG 12-Lead    IMPRESSION AND PLAN:  Principal Problem:  Accelerated hypertension -continue home meds, additional antihypertensives for blood pressure goal less than 160/100 tonight. Active Problems:   Acute systolic CHF (congestive heart failure) (HCC) -IV Lasix given in the ED, will get an echocardiogram in the morning, patient does not have a prior history of chronic heart failure, and I suspect this may all be acute heart failure due to her blood pressure   PVD (peripheral vascular disease) ([place]) -continue home meds   COPD (chronic obstructive pulmonary disease) (HCC) -home dose inhalers   GERD (gastroesophageal reflux disease) -home dose PPI  Chart review performed and case discussed with ED provider. Labs, imaging and/or ECG reviewed by provider and discussed with patient/family. Management plans discussed with the patient and/or family.  DVT PROPHYLAXIS: SubQ lovenox  GI PROPHYLAXIS: PPI  ADMISSION STATUS: Inpatient  CODE STATUS: Full Code Status History    Date Active Date Inactive Code Status Order ID Comments User Context   10/31/2016 0237 11/04/2016 2008 Full Code [id number]  Lance Coon, MD Inpatient   07/09/2016 1558 07/10/2016 1914 Full Code [id number]  Henreitta Leber, MD Inpatient   04/12/2015 1949 04/16/2015  1818 Full Code [id number]  Hessie Knows, MD Inpatient   04/11/2015 1252 04/12/2015 1949 Full Code [id number]  Hessie Knows, MD Inpatient   07/26/2014 0445 07/26/2014 1934 Full Code [id number]  Ashok Pall, MD Inpatient      TOTAL TIME TAKING CARE OF THIS PATIENT: 45 minutes.   Jannifer Franklin, Elijio Staples [place]va 03/07/2018, 11:23 PM  [facility]  Office  [phone]  CC: Primary care physician; Dion Body, MD  Note:  This document was prepared using Dragon voice recognition software and may include unintentional dictation errors.

## 2018-03-07 NOTE — ED Notes (Signed)
Patient removed her oxygen to use restroom. Patient placed back on oxygen via [place]. Patient's oxygen saturation had dropped to 89%. Patient again asked to hit call bell and wait for assistance. Patient verbalized understanding.

## 2018-03-07 NOTE — ED Notes (Signed)
Patient transported to X-ray 

## 2018-03-07 NOTE — ED Notes (Signed)
Patient called out and stated she felt short of breath. Put patient on 2L [place] because she was desating to 88%

## 2018-03-07 NOTE — ED Notes (Signed)
Patient transported to 235

## 2018-03-07 NOTE — ED Notes (Signed)
Patient up to toilet to use restroom. Patient became SOB. RN assessed patient's lung sounds. Patient had increased expiratory wheezes from prior exam. Patient's oxygen saturation decreased to 80% on RA. Patient placed on 2L [place]. RN notified MD Joni Fears. MD ordered 1 duoneb and 2 albuterol breathing treatments. Medications administered. Patient's oxygen saturation currently 98%. RN will continue to monitor.

## 2018-03-07 NOTE — ED Provider Notes (Signed)
[facility] Emergency Department Provider Note  ____________________________________________  Time seen: Approximately 8:58 PM  I have reviewed the triage vital signs and the nursing notes.   HISTORY  Chief Complaint Hypertension    HPI Melanie Huffman is a 84 y.o. female with a history of hypertension who comes to the ED due to elevated blood pressure.she reports compliance with her blood pressure medications. Denies any chest pain shortness of breath back pain or abdominal pain. No dizziness or syncope. She does however have a gradual onset mild headache that is generalized for the past 3 days in the setting of her elevated blood pressures.no aggravating or alleviating factors. Headache is nonradiating and constant.not thunderclap  Review of electronic medical record shows she does have a history of subdural hematoma.    Past Medical History:  Diagnosis Date  . Asthma   . Breast cancer ([place]) 1991   mastectomy left  . Cancer ([place]) 1991   L Breast  . COPD (chronic obstructive pulmonary disease) ([place])   . Heart murmur   . Hypertension   . Peripheral vascular disease ([place])   . Shortness of breath dyspnea   . Traumatic subdural hematoma [facility])      Patient Active Problem List   Diagnosis Date Noted  . Swelling of limb 09/03/2017  . Mass of upper lobe of left lung 11/09/2016  . Uncontrolled hypertension 10/31/2016  . PVD (peripheral vascular disease) ([place]) 10/31/2016  . COPD (chronic obstructive pulmonary disease) ([place]) 10/31/2016  . GERD (gastroesophageal reflux disease) 10/31/2016  . Hyponatremia 07/10/2016  . Dehydration 07/10/2016  . Acute renal insufficiency 07/10/2016  . Hypokalemia 07/10/2016  . Cellulitis of leg, left 07/09/2016  . Patellar fracture 04/11/2015  . Subdural hematoma ([place]) 07/26/2014     Past Surgical History:  Procedure Laterality Date  . ABDOMINAL HYSTERECTOMY     Partial  . APPENDECTOMY    . BREAST BIOPSY Right    1980 and 1970's  . BREAST SURGERY  1991   L Mastectomy  . MASTECTOMY    . ORIF PATELLA Left 04/12/2015   Procedure: OPEN REDUCTION INTERNAL (ORIF) FIXATION PATELLA;  Surgeon: Hessie Knows, MD;  Location: [facility] ORS;  Service: Orthopedics;  Laterality: Left;  . PERIPHERAL VASCULAR CATHETERIZATION Left 04/17/2015   Procedure: Lower Extremity Angiography;  Surgeon: Katha Cabal, MD;  Location: [place] CV LAB;  Service: Cardiovascular;  Laterality: Left;  . PERIPHERAL VASCULAR CATHETERIZATION  04/17/2015   Procedure: Lower Extremity Intervention;  Surgeon: Katha Cabal, MD;  Location: [place] [facility];  Service: Cardiovascular;;  . TONSILLECTOMY       Prior to Admission medications   Medication Sig Start Date End Date Taking? Authorizing Provider  acetaminophen (TYLENOL) 325 MG tablet Take 2 tablets (650 mg total) by mouth every 6 (six) hours as needed for mild pain (or Fever >/= 101). 04/16/15   Reche Dixon, PA-C  Ascorbic Acid (VITAMIN C PO) Take 1 capsule by mouth daily.    [provider]  aspirin EC 81 MG tablet Take 81 mg by mouth daily.    [provider]  Calcium Carbonate-Vitamin D (CALCIUM + D PO) Take 1 tablet by mouth 2 (two) times daily.    [provider]  carvedilol (COREG) 25 MG tablet Take 25 mg by mouth 2 (two) times daily with a meal.    [provider]  cloNIDine (CATAPRES) 0.1 MG tablet Take 1 tablet (0.1 mg total) by mouth 2 (two) times daily. 11/04/16 11/04/17  Fritzi Mandes, MD  Fluticasone-Salmeterol (ADVAIR DISKUS) 250-50 MCG/DOSE AEPB Inhale 1 puff into the lungs 2 (two) times daily. 06/12/16   [provider]  guaiFENesin (MUCINEX) 600 MG 12 hr tablet Take 1 tablet (600 mg total) by mouth 2 (two) times daily. 11/04/16   Fritzi Mandes, MD  mirtazapine (REMERON) 7.5 MG tablet Take 1 tablet (7.5 mg total) by mouth at bedtime. 11/04/16   Fritzi Mandes, MD  Multiple Vitamins-Minerals (CENTRUM SILVER PO) Take 1 tablet by mouth  daily.    [provider]  omeprazole (PRILOSEC) 40 MG capsule Take 40 mg by mouth daily.     [provider]  potassium chloride SA (K-DUR,KLOR-CON) 20 MEQ tablet Take 20 mEq by mouth 2 (two) times daily.     [provider]  predniSONE (DELTASONE) 20 MG tablet Take 20 mg by mouth daily with breakfast.    [provider]  raloxifene (EVISTA) 60 MG tablet Take 60 mg by mouth daily.    [provider]  tiotropium (SPIRIVA) 18 MCG inhalation capsule Place 18 mcg into inhaler and inhale daily.     [provider]  traZODone (DESYREL) 50 MG tablet Take 50 mg by mouth at bedtime as needed for sleep.    [provider]     Allergies Amlodipine; Codeine; and Sulfa antibiotics   Family History  Problem Relation Age of Onset  . CVA Mother   . Kidney disease Mother   . Heart disease Father   . Kidney disease Cousin 7  . Kidney disease Cousin 65  . Breast cancer Neg Hx     Social History Social History   Tobacco Use  . Smoking status: Former Smoker    Packs/day: 0.50    Years: 20.00    Pack years: 10.00    Types: Cigarettes    Last attempt to quit: 01/26/1979    Years since quitting: 39.1  . Smokeless tobacco: Never Used  Substance Use Topics  . Alcohol use: No  . Drug use: No    Review of Systems  Constitutional:   No fever or chills.  ENT:   No sore throat. No rhinorrhea. Cardiovascular:   No chest pain or syncope. Respiratory:   No dyspnea or cough. Gastrointestinal:   Negative for abdominal pain, vomiting and diarrhea.  Musculoskeletal:   Negative for focal pain or swelling All other systems reviewed and are negative except as documented above in ROS and HPI.  ____________________________________________   PHYSICAL EXAM:  VITAL SIGNS: ED Triage Vitals [03/07/18 2013]  Enc Vitals Group     BP (!) 221/105     Pulse Rate 76     Resp 18     Temp 98 F (36.7 C)     Temp src      SpO2 94 %     Weight  115 lb (52.2 kg)     Height 5' 4.5" (1.638 m)     Head Circumference      Peak Flow      Pain Score 0     Pain Loc      Pain Edu?      Excl. in [place]?     Vital signs reviewed, nursing assessments reviewed.   Constitutional:   Alert and oriented. Well appearing and in no distress. Eyes:   Conjunctivae are normal. EOMI. PERRL. ENT      Head:   Normocephalic and atraumatic.      Nose:   No congestion/rhinnorhea.  Mouth/Throat:   MMM, no pharyngeal erythema. No peritonsillar mass.       Neck:   No meningismus. Full ROM. Hematological/Lymphatic/Immunilogical:   No cervical lymphadenopathy. Cardiovascular:   RRR. Symmetric bilateral radial and DP pulses.  No murmurs.  Respiratory:   Normal respiratory effort without tachypnea/retractions. Breath sounds are clear and equal bilaterally. No wheezes/rales/rhonchi. Gastrointestinal:   Soft and nontender. Non distended. There is no CVA tenderness.  No rebound, rigidity, or guarding. Genitourinary:   deferred Musculoskeletal:   Normal range of motion in all extremities. No joint effusions.  No lower extremity tenderness.  No edema. Neurologic:   Normal speech and language.  Motor grossly intact. No acute focal neurologic deficits are appreciated.  Skin:    Skin is warm, dry and intact. No rash noted.  No petechiae, purpura, or bullae.  ____________________________________________    LABS (pertinent positives/negatives) (all labs ordered are listed, but only abnormal results are displayed) Labs Reviewed  BASIC METABOLIC PANEL - Abnormal; Notable for the following components:      Result Value   Sodium 132 (*)    Chloride 97 (*)    Glucose, Bld 116 (*)    All other components within normal limits  CBC - Abnormal; Notable for the following components:   Platelets 133 (*)    All other components within normal limits  TROPONIN I   ____________________________________________   EKG  interpreted by me Sinus rhythm rate of 75,  normal axis and intervals, LVH with associated repolarization abnormality. I will ST segments and T waves.  ____________________________________________    RADIOLOGY  Dg Chest 2 View  Result Date: 03/07/2018 CLINICAL DATA:  Hypertension and dyspnea EXAM: CHEST - 2 VIEW COMPARISON:  11/02/2016 CXR FINDINGS: Pulmonary vascular engorgement consistent with mild CHF with stable cardiomegaly and aortic atherosclerosis. Kerley B-lines are noted bilaterally. No effusion or pneumothorax. No acute osseous abnormality. Degenerative changes are seen along the dorsal spine. IMPRESSION: Cardiomegaly with mild CHF. Aortic atherosclerosis without aneurysm. Electronically Signed   By: Ashley Royalty M.D.   On: 03/07/2018 21:10   Ct Head Wo Contrast  Result Date: 03/07/2018 CLINICAL DATA:  Worsening hypertension since Friday. EXAM: CT HEAD WITHOUT CONTRAST TECHNIQUE: Contiguous axial images were obtained from the base of the skull through the vertex without intravenous contrast. COMPARISON:  07/25/2014 FINDINGS: Brain: Left parafalcine calcification is identified at site of suspected parafalcine hemorrhage in 2015. Findings favor a calcifying parafalcine meningioma measuring approximately 6 mm in diameter. No localized edema. No midline shift. Sulcal and ventricular prominence consistent with atrophy with chronic small vessel ischemia. No acute intra-axial hemorrhage, midline shift or edema. No extra-axial fluid collections. Fourth ventricle and basal cisterns are midline. Vascular: No hyperdense vessel sign. Skull: No skull fracture or suspicious osseous lesions. Sinuses/Orbits: Clear bilateral mastoids. Intact orbits. Bilateral cataract extractions. No acute sinus disease. Other: None IMPRESSION: Atrophy with chronic small vessel ischemia. No acute intracranial abnormality. Left parafalcine calcification at site of suspected prior parafalcine hemorrhage is now more likely to have represented a parafalcine calcifying  meningioma in retrospect, now measuring 6 mm. Electronically Signed   By: Ashley Royalty M.D.   On: 03/07/2018 22:51    ____________________________________________   PROCEDURES Procedures  ____________________________________________  DIFFERENTIAL DIAGNOSIS   cerebral hemorrhage, essential hypertension  CLINICAL IMPRESSION / ASSESSMENT AND PLAN / ED COURSE  Pertinent labs & imaging results that were available during my care of the patient were reviewed by me and considered in my medical decision making (see chart for  details).    patient well appearing no acute distress, presents with minor symptoms but due to her age and medical history, we'll get a CT scan of the head to evaluate for intracerebral hemorrhage in the setting of her severe hypertension. If unremarkable, patient will need to be managed with titration of her antihypertensives. Plan for close follow-up with her primary care doctor if she is able to be discharged home. Patient does state that she plans to call their clinic tomorrow for immediate follow-up. Other vital signs, exam, EKG all reassuring.  Clinical Course as of Mar 07 2257  Nancy Fetter Mar 07, 2018  2208 Pt had severe hypoxia to 80% on RA when ambulating in the room to the toilet. Will admit for respiratory failure. Doubt PE - she is wheezing and with cxr findings of vasc. Congestion and kerley B lines, hypertension and hypoxia are CHF related. Will give iv lasix and admit for further diuresis and oxygen support.    [PS]  2256 Ct head reveals no acute findings. No evidence of ICH.  Case d/w hospitalist for further mgmt of CHF exac / pulm edema and hypoxia.    [PS]    Clinical Course User Index [PS] Carrie Mew, MD     ____________________________________________   FINAL CLINICAL IMPRESSION(S) / ED DIAGNOSES    Final diagnoses:  Hypertensive urgency  Acute on chronic congestive heart failure, unspecified heart failure type ([place])  Acute respiratory  failure with hypoxia [facility])     ED Discharge Orders    None      Portions of this note were generated with dragon dictation software. Dictation errors may occur despite best attempts at proofreading.    Carrie Mew, MD 03/07/18 2259

## 2018-03-07 NOTE — ED Notes (Signed)
Patient called out reporting increased SOB. Patient had removed her oxygen against previous instructions from this RN. Patient's oxygen saturation was 88% on RA. Patient placed back on 2L [place]. Patient's oxygen saturation currently 95%. Patient was instructed not to remove oxygen tubing. Patient verbalized understanding of these instructions. MD Joni Fears. RN will continue to monitor.

## 2018-03-07 NOTE — ED Triage Notes (Addendum)
Patient states that she has a history of hypertension. Patient states that her bp is normally well controlled. Patient states that her bp has been increasing since Friday. Patient states that today her bp was 224/119 at home. Patient states that she has also had an increase in shortness of breath that started today. Patient denies chest pain.

## 2018-03-08 ENCOUNTER — Other Ambulatory Visit: Payer: Self-pay

## 2018-03-08 ENCOUNTER — Inpatient Hospital Stay
Admit: 2018-03-08 | Discharge: 2018-03-08 | Disposition: A | Discharge status: Home / Self Care | Payer: PPO | Attending: Internal Medicine | Admitting: Internal Medicine

## 2018-03-08 LAB — CBC
HEMATOCRIT: 42.9 % (ref 35.0–47.0)
HEMOGLOBIN: 14.3 g/dL (ref 12.0–16.0)
MCH: 30.7 pg (ref 26.0–34.0)
MCHC: 33.4 g/dL (ref 32.0–36.0)
MCV: 91.7 fL (ref 80.0–100.0)
Platelets: 133 10*3/uL — ABNORMAL LOW (ref 150–440)
RBC: 4.68 MIL/uL (ref 3.80–5.20)
RDW: 13.7 % (ref 11.5–14.5)
WBC: 5.1 10*3/uL (ref 3.6–11.0)

## 2018-03-08 LAB — BASIC METABOLIC PANEL
ANION GAP: 8 (ref 5–15)
BUN: 10 mg/dL (ref 6–20)
CO2: 32 mmol/L (ref 22–32)
Calcium: 8.9 mg/dL (ref 8.9–10.3)
Chloride: 97 mmol/L — ABNORMAL LOW (ref 101–111)
Creatinine, Ser: 0.72 mg/dL (ref 0.44–1.00)
GFR calc Af Amer: >60 mL/min (ref >60)
Glucose, Bld: 108 mg/dL — ABNORMAL HIGH (ref 65–99)
POTASSIUM: 3.2 mmol/L — AB (ref 3.5–5.1)
SODIUM: 137 mmol/L (ref 135–145)

## 2018-03-08 MED ORDER — ONDANSETRON HCL 4 MG/2ML IJ SOLN: 4.0000 mg | Freq: Four times a day (QID) | INTRAMUSCULAR | Status: DC | PRN

## 2018-03-08 MED ORDER — ASPIRIN EC 81 MG PO TBEC
81.0000 mg | DELAYED_RELEASE_TABLET | Freq: Every day | ORAL | Status: DC
  Administered 2018-03-08: 81 mg via ORAL
  Filled 2018-03-08 (×2): qty 1

## 2018-03-08 MED ORDER — TRAZODONE HCL 50 MG PO TABS
50.0000 mg | ORAL_TABLET | Freq: Every evening | ORAL | Status: DC | PRN
  Administered 2018-03-08: 50 mg via ORAL
  Filled 2018-03-08: qty 1

## 2018-03-08 MED ORDER — TIOTROPIUM BROMIDE MONOHYDRATE 18 MCG IN CAPS
18.0000 ug | ORAL_CAPSULE | Freq: Every day | RESPIRATORY_TRACT | Status: DC
  Administered 2018-03-08 – 2018-03-09 (×2): 18 ug via RESPIRATORY_TRACT
  Filled 2018-03-08: qty 5

## 2018-03-08 MED ORDER — MIRTAZAPINE 15 MG PO TABS
7.5000 mg | ORAL_TABLET | Freq: Every day | ORAL | Status: DC
  Administered 2018-03-08 (×2): 7.5 mg via ORAL
  Filled 2018-03-08 (×2): qty 1

## 2018-03-08 MED ORDER — PREDNISONE 20 MG PO TABS
20.0000 mg | ORAL_TABLET | Freq: Every day | ORAL | Status: DC
  Administered 2018-03-08 – 2018-03-09 (×2): 20 mg via ORAL
  Filled 2018-03-08 (×2): qty 1

## 2018-03-08 MED ORDER — ENOXAPARIN SODIUM 40 MG/0.4ML ~~LOC~~ SOLN
40.0000 mg | SUBCUTANEOUS | Status: DC
  Administered 2018-03-08: 40 mg via SUBCUTANEOUS
  Filled 2018-03-08: qty 0.4

## 2018-03-08 MED ORDER — DIPHENHYDRAMINE HCL 25 MG PO CAPS
25.0000 mg | ORAL_CAPSULE | Freq: Every evening | ORAL | Status: DC | PRN
Start: 2018-03-08 — End: 2018-03-09
  Administered 2018-03-09: 25 mg via ORAL
  Filled 2018-03-08: qty 1

## 2018-03-08 MED ORDER — ONDANSETRON HCL 4 MG PO TABS: 4.0000 mg | ORAL_TABLET | Freq: Four times a day (QID) | ORAL | Status: DC | PRN

## 2018-03-08 MED ORDER — CLONIDINE HCL 0.1 MG PO TABS
0.1000 mg | ORAL_TABLET | Freq: Two times a day (BID) | ORAL | Status: DC
  Administered 2018-03-08 – 2018-03-09 (×4): 0.1 mg via ORAL
  Filled 2018-03-08 (×4): qty 1

## 2018-03-08 MED ORDER — PANTOPRAZOLE SODIUM 40 MG PO TBEC
40.0000 mg | DELAYED_RELEASE_TABLET | Freq: Every day | ORAL | Status: DC
  Administered 2018-03-08 – 2018-03-09 (×2): 40 mg via ORAL
  Filled 2018-03-08 (×2): qty 1

## 2018-03-08 MED ORDER — ACETAMINOPHEN 650 MG RE SUPP: 650.0000 mg | Freq: Four times a day (QID) | RECTAL | Status: DC | PRN

## 2018-03-08 MED ORDER — LABETALOL HCL 5 MG/ML IV SOLN
10.0000 mg | INTRAVENOUS | Status: DC | PRN
  Administered 2018-03-08 – 2018-03-09 (×2): 10 mg via INTRAVENOUS
  Filled 2018-03-08 (×2): qty 4

## 2018-03-08 MED ORDER — POTASSIUM CHLORIDE CRYS ER 20 MEQ PO TBCR
40.0000 meq | EXTENDED_RELEASE_TABLET | Freq: Once | ORAL | Status: AC
  Administered 2018-03-08: 40 meq via ORAL
  Filled 2018-03-08: qty 2

## 2018-03-08 MED ORDER — CARVEDILOL 25 MG PO TABS
25.0000 mg | ORAL_TABLET | Freq: Two times a day (BID) | ORAL | Status: DC
  Administered 2018-03-08 – 2018-03-09 (×3): 25 mg via ORAL
  Filled 2018-03-08 (×3): qty 1

## 2018-03-08 MED ORDER — ACETAMINOPHEN 325 MG PO TABS
650.0000 mg | ORAL_TABLET | Freq: Four times a day (QID) | ORAL | Status: DC | PRN
  Administered 2018-03-08: 650 mg via ORAL
  Filled 2018-03-08: qty 2

## 2018-03-08 MED ORDER — MOMETASONE FURO-FORMOTEROL FUM 200-5 MCG/ACT IN AERO
2.0000 | INHALATION_SPRAY | Freq: Two times a day (BID) | RESPIRATORY_TRACT | Status: DC
  Administered 2018-03-08 – 2018-03-09 (×3): 2 via RESPIRATORY_TRACT
  Filled 2018-03-08: qty 8.8

## 2018-03-08 NOTE — Progress Notes (Addendum)
Pt states that trazodone didn't helped with sleep. BP at 1946 was at 152/98 scheduled clonidine was given. At 2338 pt BP was at 160/71. Doctor Jannifer Franklin was notified. Awaiting callback.  Update (1147pm): Doctor Jannifer Franklin ordered benadryl capsule  as needed for sleep and give PRN labetalol for BP of 160/71. Will continue to monitor

## 2018-03-08 NOTE — Plan of Care (Signed)

## 2018-03-08 NOTE — Progress Notes (Signed)
*  PRELIMINARY RESULTS* Echocardiogram 2D Echocardiogram has been performed.  Melanie Huffman 03/08/2018, 3:05 PM

## 2018-03-08 NOTE — Progress Notes (Signed)
SATURATION QUALIFICATIONS: (This note is used to comply with regulatory documentation for home oxygen)  Patient Saturations on Room Air at Rest  95%  Patient Saturations on Room Air while Ambulating = 88 %  Patient Saturations on  2Liters of oxygen while Ambulating = 95 %  Please briefly explain why patient needs home oxygen: 

## 2018-03-08 NOTE — Plan of Care (Signed)
  Problem: Clinical Measurements: Goal: Ability to maintain clinical measurements within normal limits will improve Outcome: Progressing Goal: Will remain free from infection Outcome: Progressing   Problem: Pain Managment: Goal: General experience of comfort will improve Outcome: Progressing   Problem: Safety: Goal: Ability to remain free from injury will improve Outcome: Progressing   

## 2018-03-08 NOTE — Progress Notes (Signed)
[place] at [facility]                                                                                                                                                                                  Patient Demographics   Melanie Huffman, is a 84 y.o. female, DOB - 1928/10/09, OHY:[id number]  Admit date - 03/07/2018   Admitting Physician Lance Coon, MD  Outpatient Primary MD for the patient is Dion Body, MD   LOS - 1  Subjective: Patient seen and evaluated today Currently on oxygen via nasal cannula  patient does not use home oxygen On ambulation her oxygen saturation drops Blood pressure better controlled  no complaints of any chest pain No palpitations, headache and blurry vision  Review of Systems:   CONSTITUTIONAL: No documented fever. No fatigue, weakness. No weight gain, no weight loss.  EYES: No blurry or double vision.  ENT: No tinnitus. No postnasal drip. No redness of the oropharynx.  RESPIRATORY: No cough, no wheeze, no hemoptysis.  Has some shortness of breath  CARDIOVASCULAR: No chest pain. No orthopnea. No palpitations. No syncope.  GASTROINTESTINAL: No nausea, no vomiting or diarrhea. No abdominal pain. No melena or hematochezia.  GENITOURINARY: No dysuria or hematuria.  ENDOCRINE: No polyuria or nocturia. No heat or cold intolerance.  HEMATOLOGY: No anemia. No bruising. No bleeding.  INTEGUMENTARY: No rashes. No lesions.  MUSCULOSKELETAL: No arthritis. No swelling. No gout.  NEUROLOGIC: No numbness, tingling, or ataxia. No seizure-type activity.  PSYCHIATRIC: No anxiety. No insomnia. No ADD.    Vitals:   Vitals:   03/08/18 1129 03/08/18 1534 03/08/18 1537 03/08/18 1538  BP: (!) 120/57     Pulse: 62 80 68 67  Resp:      Temp:      TempSrc:      SpO2: 91% (!) 88% 94% 95%  Weight:      Height:        Wt Readings from Last 3 Encounters:  03/08/18 51 kg (112 lb 6.4 oz)  09/03/17 55.8 kg (123 lb)  02/10/17 55 kg (121  lb 4.1 oz)     Intake/Output Summary (Last 24 hours) at 03/08/2018 1607 Last data filed at 03/08/2018 1352 Gross per 24 hour  Intake 600 ml  Output 1250 ml  Net -650 ml    Physical Exam:   GENERAL: Pleasant-appearing female patient in no apparent distress.  HEAD, EYES, EARS, NOSE AND THROAT: Atraumatic, normocephalic. Extraocular muscles are intact. Pupils equal and reactive to light. Sclerae anicteric. No conjunctival injection. No oro-pharyngeal erythema.  NECK: Supple. There is no jugular venous distention. No bruits, no lymphadenopathy, no thyromegaly.  HEART: Regular rate  and rhythm,. No murmurs, no rubs, no clicks.  LUNGS: Clear to auscultation bilaterally. No rales or rhonchi. No wheezes.  ABDOMEN: Soft, flat, nontender, nondistended. Has good bowel sounds. No hepatosplenomegaly appreciated.  EXTREMITIES: No evidence of any cyanosis, clubbing, or peripheral edema.  +2 pedal and radial pulses bilaterally.  NEUROLOGIC: The patient is alert, awake, and oriented x3 with no focal motor or sensory deficits appreciated bilaterally.  SKIN: Moist and warm with no rashes appreciated.  Psych: Not anxious, depressed LN: No inguinal LN enlargement    Antibiotics   Anti-infectives (From admission, onward)   None      Medications   Scheduled Meds: . aspirin EC  81 mg Oral Daily  . carvedilol  25 mg Oral BID WC  . cloNIDine  0.1 mg Oral BID  . enoxaparin (LOVENOX) injection  40 mg Subcutaneous Q24H  . mirtazapine  7.5 mg Oral QHS  . mometasone-formoterol  2 puff Inhalation BID  . pantoprazole  40 mg Oral Daily  . predniSONE  20 mg Oral Q breakfast  . tiotropium  18 mcg Inhalation Daily   Continuous Infusions: PRN Meds:.acetaminophen **OR** acetaminophen, labetalol, ondansetron **OR** ondansetron (ZOFRAN) IV, traZODone   Data Review:   Micro Results No results found for this or any previous visit (from the past 240 hour(s)).  Radiology Reports Dg Chest 2 View  Result  Date: 03/07/2018 CLINICAL DATA:  Hypertension and dyspnea EXAM: CHEST - 2 VIEW COMPARISON:  11/02/2016 CXR FINDINGS: Pulmonary vascular engorgement consistent with mild CHF with stable cardiomegaly and aortic atherosclerosis. Kerley B-lines are noted bilaterally. No effusion or pneumothorax. No acute osseous abnormality. Degenerative changes are seen along the dorsal spine. IMPRESSION: Cardiomegaly with mild CHF. Aortic atherosclerosis without aneurysm. Electronically Signed   By: Ashley Royalty M.D.   On: 03/07/2018 21:10   Ct Head Wo Contrast  Result Date: 03/07/2018 CLINICAL DATA:  Worsening hypertension since Friday. EXAM: CT HEAD WITHOUT CONTRAST TECHNIQUE: Contiguous axial images were obtained from the base of the skull through the vertex without intravenous contrast. COMPARISON:  07/25/2014 FINDINGS: Brain: Left parafalcine calcification is identified at site of suspected parafalcine hemorrhage in 2015. Findings favor a calcifying parafalcine meningioma measuring approximately 6 mm in diameter. No localized edema. No midline shift. Sulcal and ventricular prominence consistent with atrophy with chronic small vessel ischemia. No acute intra-axial hemorrhage, midline shift or edema. No extra-axial fluid collections. Fourth ventricle and basal cisterns are midline. Vascular: No hyperdense vessel sign. Skull: No skull fracture or suspicious osseous lesions. Sinuses/Orbits: Clear bilateral mastoids. Intact orbits. Bilateral cataract extractions. No acute sinus disease. Other: None IMPRESSION: Atrophy with chronic small vessel ischemia. No acute intracranial abnormality. Left parafalcine calcification at site of suspected prior parafalcine hemorrhage is now more likely to have represented a parafalcine calcifying meningioma in retrospect, now measuring 6 mm. Electronically Signed   By: Ashley Royalty M.D.   On: 03/07/2018 22:51     CBC Recent Labs  Lab 03/07/18 2016 03/08/18 0513  WBC 5.1 5.1  HGB 14.4 14.3   HCT 43.0 42.9  PLT 133* 133*  MCV 90.5 91.7  MCH 30.4 30.7  MCHC 33.6 33.4  RDW 13.5 13.7    Chemistries  Recent Labs  Lab 03/07/18 2016 03/08/18 0513  NA 132* 137  K 3.8 3.2*  CL 97* 97*  CO2 28 32  GLUCOSE 116* 108*  BUN 11 10  CREATININE 0.75 0.72  CALCIUM 8.9 8.9   ------------------------------------------------------------------------------------------------------------------ estimated creatinine clearance is 39.9 mL/min (by C-G formula  based on SCr of 0.72 mg/dL). ------------------------------------------------------------------------------------------------------------------ No results for input(s): HGBA1C in the last 72 hours. ------------------------------------------------------------------------------------------------------------------ No results for input(s): CHOL, HDL, LDLCALC, TRIG, CHOLHDL, LDLDIRECT in the last 72 hours. ------------------------------------------------------------------------------------------------------------------ No results for input(s): TSH, T4TOTAL, T3FREE, THYROIDAB in the last 72 hours.  Invalid input(s): FREET3 ------------------------------------------------------------------------------------------------------------------ No results for input(s): VITAMINB12, FOLATE, FERRITIN, TIBC, IRON, RETICCTPCT in the last 72 hours.  Coagulation profile No results for input(s): INR, PROTIME in the last 168 hours.  No results for input(s): DDIMER in the last 72 hours.  Cardiac Enzymes Recent Labs  Lab 03/07/18 2016  TROPONINI <0.03   ------------------------------------------------------------------------------------------------------------------ Invalid input(s): POCBNP    Assessment & Plan   84 year old elderly female patient with history of peripheral vascular disease, COPD, GERD was admitted to telemetry for elevated blood pressure and a new onset heart failure.  1.  Uncontrolled hypertension Blood pressure better  controlled with medications Continue oral coreg, oral clonidine  2.  Acute congestive heart failure appears systolic in etiology Follow-up echocardiogram report to check LV function  3. Hypoxia on ambulation Will try to wean oxygen  4. COPD stable  5. DVT prophylaxis with [place] lovenox 40 mg daily      Code Status Orders  (From admission, onward)        Start     Ordered   03/08/18 0009  Full code  Continuous     03/08/18 0008    Code Status History    Date Active Date Inactive Code Status Order ID Comments User Context   10/31/2016 0237 11/04/2016 2008 Full Code [id number]  Lance Coon, MD Inpatient   07/09/2016 1558 07/10/2016 1914 Full Code [id number]  Henreitta Leber, MD Inpatient   04/12/2015 1949 04/16/2015 1818 Full Code [id number]  Hessie Knows, MD Inpatient   04/11/2015 1252 04/12/2015 1949 Full Code [id number]  Hessie Knows, MD Inpatient   07/26/2014 0445 07/26/2014 1934 Full Code [id number]  Ashok Pall, MD Inpatient    Advance Directive Documentation     Most Recent Value  Type of Advance Directive  Living will, Healthcare Power of Attorney  Pre-existing out of facility DNR order (yellow form or pink MOST form)  -  "MOST" Form in Place?  -      Time Spent in minutes   35  Greater than 50% of time spent in care coordination and counseling patient regarding the condition and plan of care.   Saundra Shelling M.D on 03/08/2018 at 4:07 PM  Between 7am to 6pm - Pager - [phone]  After 6pm go to [email or web address] - Proofreader  [employer]   Office  [phone]

## 2018-03-08 NOTE — Care Management (Signed)
patient resides in [place]- the independent living section on the grounds of [place] [facility].  She is admitted with CHF nd also has underlying copd- which is currently stble.  Oxygen is acute and today she did qualify for continuous oxygen.  These results will expire in 48 hours

## 2018-03-09 LAB — POTASSIUM: Potassium: 3.6 mmol/L (ref 3.5–5.1)

## 2018-03-09 MED ORDER — LISINOPRIL 20 MG PO TABS
20.0000 mg | ORAL_TABLET | Freq: Every day | ORAL | Status: DC
  Administered 2018-03-09: 20 mg via ORAL
  Filled 2018-03-09: qty 1

## 2018-03-09 MED ORDER — ASPIRIN EC 81 MG PO TBEC: 81.0000 mg | DELAYED_RELEASE_TABLET | Freq: Every day | ORAL | 0 refills | Status: DC

## 2018-03-09 MED ORDER — POTASSIUM CHLORIDE CRYS ER 20 MEQ PO TBCR: 20.0000 meq | EXTENDED_RELEASE_TABLET | Freq: Every day | ORAL | 0 refills | Status: DC

## 2018-03-09 MED ORDER — NITROGLYCERIN 2 % TD OINT
0.5000 [in_us] | TOPICAL_OINTMENT | Freq: Once | TRANSDERMAL | Status: AC
  Administered 2018-03-09: 0.5 [in_us] via TOPICAL
  Filled 2018-03-09: qty 1

## 2018-03-09 MED ORDER — FUROSEMIDE 20 MG PO TABS: 20.0000 mg | ORAL_TABLET | Freq: Every day | ORAL | 0 refills | Status: DC

## 2018-03-09 NOTE — Progress Notes (Addendum)
Pt BP was at 168/68 HR 72 after administration of PRN Labetalol. Doctor Marcille Blanco was page and ordered nitroglycerin 0.5 inch STAT once.  Update 0244: Pt BP went down to 153/70 and HR 71 after administration of Nitroglycerin.

## 2018-03-09 NOTE — Discharge Summary (Signed)
Melanie Huffman at [facility], 84 y.o., DOB [birth year], MRN [id number]. Admission date: 03/07/2018 Discharge Date 03/09/2018 Primary MD Dion Body, MD Admitting Physician Lance Coon, MD  Admission Diagnosis   1.  Accelerated hypertension 2.  Acute on chronic systolic heart failure exacerbation 3.  Peripheral vascular disease 4.  Emphysema 5 GERD.  Discharge Diagnosis       1.  Hypertension better controlled 2.  Systolic heart failure exacerbation resolved 3.  Emphysema 4 GERD. 5.  Peripheral vascular disease  84 year old very elderly female patient with history of hypertension, emphysema, GERD, peripheral vascular disease, systolic heart failure was admitted on 03/07/2018 for elevated blood pressure and shortness of breath patient blood pressure was more than.  154 mmHg systolic.  Earlier today in the emergency room chest x-ray showed a pulmonary vascular congestion.  She was admitted to telemetry for control of blood pressure and IV Lasix was given for diuresis.  She was worked up with echocardiogram in the hospital.  He also had a CT head which showed no acute abnormality. troponin was negative.  Her potassium was low which was supplemented in the hospital.  Patient will be discharged home on ACE inhibitor, potassium supplements, oral Lasix, betablocker.  Blood pressure medications optimized.  Patient will follow up with cardiology as outpatient.  Patient hemodynamically stable will be discharged home.  Consults  None  Significant Tests:  See full reports for all details    Dg Chest 2 View  Result Date: 03/07/2018 CLINICAL DATA:  Hypertension and dyspnea EXAM: CHEST - 2 VIEW COMPARISON:  11/02/2016 CXR FINDINGS: Pulmonary vascular engorgement consistent with mild CHF with stable cardiomegaly and aortic atherosclerosis. Kerley B-lines are noted bilaterally. No effusion or pneumothorax. No acute osseous abnormality. Degenerative changes are seen  along the dorsal spine. IMPRESSION: Cardiomegaly with mild CHF. Aortic atherosclerosis without aneurysm. Electronically Signed   By: Ashley Royalty M.D.   On: 03/07/2018 21:10   Ct Head Wo Contrast  Result Date: 03/07/2018 CLINICAL DATA:  Worsening hypertension since Friday. EXAM: CT HEAD WITHOUT CONTRAST TECHNIQUE: Contiguous axial images were obtained from the base of the skull through the vertex without intravenous contrast. COMPARISON:  07/25/2014 FINDINGS: Brain: Left parafalcine calcification is identified at site of suspected parafalcine hemorrhage in 2015. Findings favor a calcifying parafalcine meningioma measuring approximately 6 mm in diameter. No localized edema. No midline shift. Sulcal and ventricular prominence consistent with atrophy with chronic small vessel ischemia. No acute intra-axial hemorrhage, midline shift or edema. No extra-axial fluid collections. Fourth ventricle and basal cisterns are midline. Vascular: No hyperdense vessel sign. Skull: No skull fracture or suspicious osseous lesions. Sinuses/Orbits: Clear bilateral mastoids. Intact orbits. Bilateral cataract extractions. No acute sinus disease. Other: None IMPRESSION: Atrophy with chronic small vessel ischemia. No acute intracranial abnormality. Left parafalcine calcification at site of suspected prior parafalcine hemorrhage is now more likely to have represented a parafalcine calcifying meningioma in retrospect, now measuring 6 mm. Electronically Signed   By: Ashley Royalty M.D.   On: 03/07/2018 22:51       Today   Subjective:   Melanie Huffman 84 year old pleasant female patient in no acute distress Seen and evaluated in the day of discharge No chest pain Was weaned off oxygen No shortness of breath No palpitations  Objective:   Blood pressure (!) 159/89, pulse 69, temperature 98.6 F (37 C), temperature source Oral, resp. rate 18, height 5\' 4"  (1.626 m), weight 51.3 kg (113 lb 1.6 oz), SpO2  91 %.  .  Intake/Output  Summary (Last 24 hours) at 03/09/2018 1433 Last data filed at 03/09/2018 1028 Gross per 24 hour  Intake 600 ml  Output 1900 ml  Net -1300 ml    Exam VITAL SIGNS: Blood pressure (!) 159/89, pulse 69, temperature 98.6 F (37 C), temperature source Oral, resp. rate 18, height 5\' 4"  (1.626 m), weight 51.3 kg (113 lb 1.6 oz), SpO2 91 %.  GENERAL:  84 y.o.-year-old patient lying in the bed with no acute distress.  EYES: Pupils equal, round, reactive to light and accommodation. No scleral icterus. Extraocular muscles intact.  HEENT: Head atraumatic, normocephalic. Oropharynx and nasopharynx clear.  NECK:  Supple, no jugular venous distention. No thyroid enlargement, no tenderness.  LUNGS: Normal breath sounds bilaterally, no wheezing, rales,rhonchi or crepitation. No use of accessory muscles of respiration.  CARDIOVASCULAR: S1, S2 normal. No murmurs, rubs, or gallops.  ABDOMEN: Soft, nontender, nondistended. Bowel sounds present. No organomegaly or mass.  EXTREMITIES: No pedal edema, cyanosis, or clubbing.  NEUROLOGIC: Cranial nerves II through XII are intact. Muscle strength 5/5 in all extremities. Sensation intact. Gait not checked.  PSYCHIATRIC: The patient is alert and oriented x 3.  SKIN: No obvious rash, lesion, or ulcer.   Data Review     CBC w Diff:  Lab Results  Component Value Date   WBC 5.1 03/08/2018   HGB 14.3 03/08/2018   HGB 13.0 10/29/2014   HCT 42.9 03/08/2018   HCT 39.7 10/29/2014   PLT 133 (L) 03/08/2018   PLT 152 10/29/2014   LYMPHOPCT 17 02/09/2017   MONOPCT 8 02/09/2017   EOSPCT 1 02/09/2017   BASOPCT 1 02/09/2017   CMP:  Lab Results  Component Value Date   NA 137 03/08/2018   NA 135 (L) 10/29/2014   K 3.6 03/09/2018   K 3.4 (L) 10/29/2014   CL 97 (L) 03/08/2018   CL 100 10/29/2014   CO2 32 03/08/2018   CO2 27 10/29/2014   BUN 10 03/08/2018   BUN 12 10/29/2014   CREATININE 0.72 03/08/2018   CREATININE 0.68 10/29/2014   PROT 6.3 (L) 02/09/2017    PROT 5.9 (L) 10/29/2014   ALBUMIN 3.6 02/09/2017   ALBUMIN 3.4 10/29/2014   BILITOT 1.0 02/09/2017   BILITOT 0.5 10/29/2014   ALKPHOS 65 02/09/2017   ALKPHOS 60 10/29/2014   AST 64 (H) 02/09/2017   AST 23 10/29/2014   ALT 61 (H) 02/09/2017   ALT 19 10/29/2014  .  Micro Results No results found for this or any previous visit (from the past 240 hour(s)).      Code Status Orders  (From admission, onward)        Start     Ordered   03/08/18 0009  Full code  Continuous     03/08/18 0008    Code Status History    Date Active Date Inactive Code Status Order ID Comments User Context   10/31/2016 0237 11/04/2016 2008 Full Code [id number]  Lance Coon, MD Inpatient   07/09/2016 1558 07/10/2016 1914 Full Code [id number]  Henreitta Leber, MD Inpatient   04/12/2015 1949 04/16/2015 1818 Full Code [id number]  Hessie Knows, MD Inpatient   04/11/2015 1252 04/12/2015 1949 Full Code [id number]  Hessie Knows, MD Inpatient   07/26/2014 0445 07/26/2014 1934 Full Code [id number]  Ashok Pall, MD Inpatient    Advance Directive Documentation     Most Recent Value  Type of Advance Directive  Living will, Healthcare Power of [place]  Pre-existing out of facility DNR order (yellow form or pink MOST form)  -  "MOST" Form in Place?  -          Follow-up Information    Dion Body, MD. Go on 03/15/2018.   Specialty:  Family Medicine Why:  Appointment Time: 1:45pm Contact information: [place] [address] [phone]        Teodoro Spray, MD. Go on 03/24/2018.   Specialty:  Cardiology Why:  Appointment Time: 2:30pm Contact information: [place] [address] [phone]           Discharge Medications   Allergies as of 03/09/2018      Reactions   Amlodipine Swelling    [Onset: 08/26/2013]   Codeine Nausea Only   Sulfa Antibiotics Rash, Nausea Only      Medication List    STOP taking these medications   guaiFENesin 600 MG  12 hr tablet Commonly known as:  MUCINEX   mirtazapine 7.5 MG tablet Commonly known as:  REMERON     TAKE these medications   acetaminophen 325 MG tablet Commonly known as:  TYLENOL Take 2 tablets (650 mg total) by mouth every 6 (six) hours as needed for mild pain (or Fever >/= 101).   ADVAIR DISKUS 250-50 MCG/DOSE Aepb Generic drug:  Fluticasone-Salmeterol Inhale 1 puff into the lungs 2 (two) times daily.   albuterol 108 (90 Base) MCG/ACT inhaler Commonly known as:  PROVENTIL HFA;VENTOLIN HFA Inhale 2 puffs into the lungs every 6 (six) hours as needed for wheezing or shortness of breath.   aspirin EC 81 MG tablet Take 1 tablet (81 mg total) by mouth daily.   CALCIUM + D PO Take 1 tablet by mouth 2 (two) times daily.   carvedilol 25 MG tablet Commonly known as:  COREG Take 25 mg by mouth 2 (two) times daily with a meal.   CENTRUM SILVER PO Take 1 tablet by mouth daily.   cloNIDine 0.1 MG tablet Commonly known as:  CATAPRES Take 1 tablet (0.1 mg total) by mouth 2 (two) times daily.   furosemide 20 MG tablet Commonly known as:  LASIX Take 1 tablet (20 mg total) by mouth daily.   omeprazole 20 MG capsule Commonly known as:  PRILOSEC Take 40 mg by mouth daily.   potassium chloride SA 20 MEQ tablet Commonly known as:  K-DUR,KLOR-CON Take 1 tablet (20 mEq total) by mouth daily. What changed:  when to take this   raloxifene 60 MG tablet Commonly known as:  EVISTA Take 60 mg by mouth daily.   ramipril 10 MG capsule Commonly known as:  ALTACE Take 20 mg by mouth every morning.   tiotropium 18 MCG inhalation capsule Commonly known as:  SPIRIVA Place 18 mcg into inhaler and inhale daily.   VITAMIN C PO Take 1 capsule by mouth daily.          Total Time in preparing paper work, data evaluation and todays exam - 35 minutes  Saundra Shelling M.D on 03/09/2018 at 2:33 PM [place]  [phone]

## 2018-03-09 NOTE — Care Management Note (Signed)
Case Management Note  Patient Details  Name: Melanie Huffman MRN: [id number] Date of Birth: [birth year]  Subjective/Objective:                    Action/Plan:   Expected Discharge Date:  03/09/18               Expected Discharge Plan:  Home/Self Care  In-House Referral:     Discharge planning Services     Post Acute Care Choice:    Choice offered to:     DME Arranged:    DME Agency:     HH Arranged:  Patient Refused [place] Agency:     Status of Service:  Completed, signed off  If discussed at [employer] of Stay Meetings, dates discussed:    Additional Comments:  Beverly Sessions, RN 03/09/2018, 5:02 PM

## 2018-03-09 NOTE — Progress Notes (Signed)
A & O. Ambulated around the nurses station on room air and tolerated it well.Takes meds ok. BP improved. IV and tele removed. Prescriptions given to pt. Discharge instructions given to pt. Pt has no further concenrs at this time.

## 2018-03-10 DIAGNOSIS — Z85828 Personal history of other malignant neoplasm of skin: Secondary | ICD-10-CM | POA: Diagnosis not present

## 2018-03-10 DIAGNOSIS — Z08 Encounter for follow-up examination after completed treatment for malignant neoplasm: Secondary | ICD-10-CM | POA: Diagnosis not present

## 2018-03-10 DIAGNOSIS — I872 Venous insufficiency (chronic) (peripheral): Secondary | ICD-10-CM | POA: Diagnosis not present

## 2018-03-15 DIAGNOSIS — I10 Essential (primary) hypertension: Secondary | ICD-10-CM | POA: Diagnosis not present

## 2018-03-15 DIAGNOSIS — I119 Hypertensive heart disease without heart failure: Secondary | ICD-10-CM | POA: Diagnosis not present

## 2018-03-15 DIAGNOSIS — I160 Hypertensive urgency: Secondary | ICD-10-CM | POA: Diagnosis not present

## 2018-03-15 DIAGNOSIS — I700 Atherosclerosis of aorta: Secondary | ICD-10-CM | POA: Diagnosis not present

## 2018-03-22 ENCOUNTER — Ambulatory Visit: Payer: PPO | Admitting: Family

## 2018-03-22 LAB — ECHOCARDIOGRAM COMPLETE
HEIGHTINCHES: 64 in
Weight: 1798.4 oz

## 2018-03-24 DIAGNOSIS — J449 Chronic obstructive pulmonary disease, unspecified: Secondary | ICD-10-CM | POA: Diagnosis not present

## 2018-03-24 DIAGNOSIS — I700 Atherosclerosis of aorta: Secondary | ICD-10-CM | POA: Diagnosis not present

## 2018-03-24 DIAGNOSIS — I10 Essential (primary) hypertension: Secondary | ICD-10-CM | POA: Diagnosis not present

## 2018-03-24 DIAGNOSIS — K644 Residual hemorrhoidal skin tags: Secondary | ICD-10-CM | POA: Diagnosis not present

## 2018-03-24 DIAGNOSIS — I519 Heart disease, unspecified: Secondary | ICD-10-CM | POA: Diagnosis not present

## 2018-03-31 DIAGNOSIS — I10 Essential (primary) hypertension: Secondary | ICD-10-CM

## 2018-03-31 DIAGNOSIS — R0902 Hypoxemia: Secondary | ICD-10-CM

## 2018-03-31 HISTORY — DX: Hypoxemia: R09.02

## 2018-03-31 HISTORY — DX: Essential (primary) hypertension: I10

## 2018-04-02 ENCOUNTER — Ambulatory Visit: Payer: PPO | Admitting: Family

## 2018-04-28 DIAGNOSIS — I700 Atherosclerosis of aorta: Secondary | ICD-10-CM | POA: Diagnosis not present

## 2018-04-28 DIAGNOSIS — I10 Essential (primary) hypertension: Secondary | ICD-10-CM | POA: Diagnosis not present

## 2018-05-02 ENCOUNTER — Other Ambulatory Visit: Payer: Self-pay

## 2018-05-02 ENCOUNTER — Emergency Department: Payer: PPO

## 2018-05-02 ENCOUNTER — Encounter: Payer: Self-pay | Admitting: Emergency Medicine

## 2018-05-02 ENCOUNTER — Emergency Department
Admission: EM | Admit: 2018-05-02 | Discharge: 2018-05-03 | Disposition: A | Discharge status: Home / Self Care | Payer: PPO | Attending: Emergency Medicine | Admitting: Emergency Medicine

## 2018-05-02 DIAGNOSIS — S52611A Displaced fracture of right ulna styloid process, initial encounter for closed fracture: Secondary | ICD-10-CM | POA: Diagnosis not present

## 2018-05-02 DIAGNOSIS — Y998 Other external cause status: Secondary | ICD-10-CM | POA: Insufficient documentation

## 2018-05-02 DIAGNOSIS — Z87891 Personal history of nicotine dependence: Secondary | ICD-10-CM | POA: Diagnosis not present

## 2018-05-02 DIAGNOSIS — S8992XA Unspecified injury of left lower leg, initial encounter: Secondary | ICD-10-CM | POA: Diagnosis present

## 2018-05-02 DIAGNOSIS — S52601A Unspecified fracture of lower end of right ulna, initial encounter for closed fracture: Secondary | ICD-10-CM | POA: Insufficient documentation

## 2018-05-02 DIAGNOSIS — Z853 Personal history of malignant neoplasm of breast: Secondary | ICD-10-CM | POA: Insufficient documentation

## 2018-05-02 DIAGNOSIS — J449 Chronic obstructive pulmonary disease, unspecified: Secondary | ICD-10-CM | POA: Diagnosis not present

## 2018-05-02 DIAGNOSIS — S52591A Other fractures of lower end of right radius, initial encounter for closed fracture: Secondary | ICD-10-CM | POA: Diagnosis not present

## 2018-05-02 DIAGNOSIS — Y9301 Activity, walking, marching and hiking: Secondary | ICD-10-CM | POA: Insufficient documentation

## 2018-05-02 DIAGNOSIS — W010XXA Fall on same level from slipping, tripping and stumbling without subsequent striking against object, initial encounter: Secondary | ICD-10-CM | POA: Diagnosis not present

## 2018-05-02 DIAGNOSIS — I10 Essential (primary) hypertension: Secondary | ICD-10-CM | POA: Insufficient documentation

## 2018-05-02 DIAGNOSIS — S82042A Displaced comminuted fracture of left patella, initial encounter for closed fracture: Secondary | ICD-10-CM | POA: Diagnosis not present

## 2018-05-02 DIAGNOSIS — Y929 Unspecified place or not applicable: Secondary | ICD-10-CM | POA: Insufficient documentation

## 2018-05-02 DIAGNOSIS — W19XXXA Unspecified fall, initial encounter: Secondary | ICD-10-CM

## 2018-05-02 DIAGNOSIS — S82002A Unspecified fracture of left patella, initial encounter for closed fracture: Secondary | ICD-10-CM | POA: Diagnosis not present

## 2018-05-02 DIAGNOSIS — S52501A Unspecified fracture of the lower end of right radius, initial encounter for closed fracture: Secondary | ICD-10-CM | POA: Diagnosis not present

## 2018-05-02 MED ORDER — OXYCODONE-ACETAMINOPHEN 5-325 MG PO TABS: 1.0000 | ORAL_TABLET | Freq: Three times a day (TID) | ORAL | 0 refills | Status: DC | PRN

## 2018-05-02 MED ORDER — BUPIVACAINE HCL (PF) 0.5 % IJ SOLN
INTRAMUSCULAR | Status: AC
  Administered 2018-05-02: 30 mL
  Filled 2018-05-02: qty 30

## 2018-05-02 MED ORDER — BUPIVACAINE HCL (PF) 0.5 % IJ SOLN
30.0000 mL | Freq: Once | INTRAMUSCULAR | Status: AC
  Administered 2018-05-02: 30 mL

## 2018-05-02 MED ORDER — ONDANSETRON 4 MG PO TBDP: 4.0000 mg | ORAL_TABLET | Freq: Three times a day (TID) | ORAL | 0 refills | Status: DC | PRN

## 2018-05-02 MED ORDER — LIDOCAINE HCL (PF) 1 % IJ SOLN
INTRAMUSCULAR | Status: AC
  Administered 2018-05-02: 10 mL via INTRADERMAL
  Filled 2018-05-02: qty 15

## 2018-05-02 MED ORDER — LIDOCAINE HCL (PF) 1 % IJ SOLN
10.0000 mL | Freq: Once | INTRAMUSCULAR | Status: AC
  Administered 2018-05-02: 10 mL via INTRADERMAL

## 2018-05-02 MED ORDER — HYDROCODONE-ACETAMINOPHEN 5-325 MG PO TABS
1.0000 | ORAL_TABLET | Freq: Once | ORAL | Status: AC
  Administered 2018-05-02: 1 via ORAL
  Filled 2018-05-02: qty 1

## 2018-05-02 MED ORDER — ONDANSETRON 4 MG PO TBDP
4.0000 mg | ORAL_TABLET | Freq: Once | ORAL | Status: AC
  Administered 2018-05-02: 4 mg via ORAL
  Filled 2018-05-02: qty 1

## 2018-05-02 MED ORDER — OXYCODONE-ACETAMINOPHEN 5-325 MG PO TABS
2.0000 | ORAL_TABLET | Freq: Once | ORAL | Status: AC
  Administered 2018-05-03: 2 via ORAL
  Filled 2018-05-02: qty 2

## 2018-05-02 NOTE — ED Notes (Signed)
Patient with X-ray att

## 2018-05-02 NOTE — ED Notes (Signed)
ED Provider at bedside. 

## 2018-05-02 NOTE — ED Provider Notes (Signed)
[facility] Emergency Department Provider Note       Time seen: ----------------------------------------- 9:34 PM on 05/02/2018 -----------------------------------------   I have reviewed the triage vital signs and the nursing notes.  HISTORY   Chief Complaint Fall    HPI Melanie Huffman is a 84 y.o. female with a history of asthma, breast cancer, COPD, hypertension, peripheral vascular disease who presents to the ED for mechanical fall.  Patient states she tripped and fell over a hose.  She is complaining of right wrist pain as well as left knee pain.  She has pain when she walks but she states she is able to walk.  She denies any headache, head injury, neck pain, chest pain, abdominal pain or other complaints.  She did have some abrasions noted to both knees and her right wrist  Past Medical History:  Diagnosis Date  . Asthma   . Breast cancer ([place]) 1991   mastectomy left  . Cancer ([place]) 1991   L Breast  . COPD (chronic obstructive pulmonary disease) ([place])   . Heart murmur   . Hypertension   . Peripheral vascular disease ([place])   . Shortness of breath dyspnea   . Traumatic subdural hematoma [facility])     Patient Active Problem List   Diagnosis Date Noted  . Accelerated hypertension 03/07/2018  . Acute systolic CHF (congestive heart failure) ([place]) 03/07/2018  . Swelling of limb 09/03/2017  . Mass of upper lobe of left lung 11/09/2016  . Uncontrolled hypertension 10/31/2016  . PVD (peripheral vascular disease) ([place]) 10/31/2016  . COPD (chronic obstructive pulmonary disease) ([place]) 10/31/2016  . GERD (gastroesophageal reflux disease) 10/31/2016  . Hyponatremia 07/10/2016  . Dehydration 07/10/2016  . Acute renal insufficiency 07/10/2016  . Hypokalemia 07/10/2016  . Cellulitis of leg, left 07/09/2016  . Patellar fracture 04/11/2015  . Subdural hematoma ([place]) 07/26/2014    Past Surgical History:  Procedure Laterality Date  . ABDOMINAL HYSTERECTOMY      Partial  . APPENDECTOMY    . BREAST BIOPSY Right    1980 and 1970's  . BREAST SURGERY  1991   L Mastectomy  . MASTECTOMY    . ORIF PATELLA Left 04/12/2015   Procedure: OPEN REDUCTION INTERNAL (ORIF) FIXATION PATELLA;  Surgeon: Hessie Knows, MD;  Location: [facility] ORS;  Service: Orthopedics;  Laterality: Left;  . PERIPHERAL VASCULAR CATHETERIZATION Left 04/17/2015   Procedure: Lower Extremity Angiography;  Surgeon: Katha Cabal, MD;  Location: [place] CV LAB;  Service: Cardiovascular;  Laterality: Left;  . PERIPHERAL VASCULAR CATHETERIZATION  04/17/2015   Procedure: Lower Extremity Intervention;  Surgeon: Katha Cabal, MD;  Location: [place] CV LAB;  Service: Cardiovascular;;  . TONSILLECTOMY      Allergies Amlodipine; Codeine; and Sulfa antibiotics  Social History Social History   Tobacco Use  . Smoking status: Former Smoker    Packs/day: 0.50    Years: 20.00    Pack years: 10.00    Types: Cigarettes    Last attempt to quit: 01/26/1979    Years since quitting: 39.2  . Smokeless tobacco: Never Used  Substance Use Topics  . Alcohol use: No  . Drug use: No   Review of Systems Constitutional: Negative for fever. Cardiovascular: Negative for chest pain. Respiratory: Negative for shortness of breath. Gastrointestinal: Negative for abdominal pain, vomiting and diarrhea. Musculoskeletal: Positive for right wrist and left knee pain Skin: Positive for abrasions over her knees and right wrist Neurological: Negative for headaches, focal weakness or numbness.  All systems negative/normal/unremarkable except as stated in the HPI  ____________________________________________   PHYSICAL EXAM:  VITAL SIGNS: ED Triage Vitals  Enc Vitals Group     BP 05/02/18 2118 (!) 192/110     Pulse Rate 05/02/18 2118 73     Resp 05/02/18 2118 20     Temp 05/02/18 2118 97.7 F (36.5 C)     Temp Source 05/02/18 2118 Oral     SpO2 05/02/18 2118 90 %     Weight 05/02/18  2107 120 lb (54.4 kg)     Height 05/02/18 2107 5\' 4"  (1.626 m)     Head Circumference --      Peak Flow --      Pain Score 05/02/18 2107 10     Pain Loc --      Pain Edu? --      Excl. in [place]? --    Constitutional: Alert and oriented. Well appearing and in no distress. Eyes: Conjunctivae are normal. Normal extraocular movements. ENT   Head: Normocephalic and atraumatic.   Nose: No congestion/rhinnorhea.   Mouth/Throat: Mucous membranes are moist.   Neck: No stridor. Cardiovascular: Normal rate, regular rhythm. No murmurs, rubs, or gallops. Respiratory: Normal respiratory effort without tachypnea nor retractions. Breath sounds are clear and equal bilaterally. No wheezes/rales/rhonchi. Gastrointestinal: Soft and nontender. Normal bowel sounds Musculoskeletal: Obvious deformity and swelling noted of the right wrist, swelling noted to the left knee with ecchymosis Neurologic:  Normal speech and language. No gross focal neurologic deficits are appreciated.  Skin:  Skin is warm, dry with skin tear over the right knee laterally and right wrist dorsomedially ____________________________________________  ED COURSE:  As part of my medical decision making, I reviewed the following data within the [place] History obtained from family if available, nursing notes, old chart and ekg, as well as notes from prior ED visits. Patient presented for mechanical fall, we will assess with imaging as indicated at this time.   Marland KitchenSplint Application Date/Time: 4/0/9811 11:17 PM Performed by: Earleen Newport, MD Authorized by: Earleen Newport, MD   Consent:    Consent obtained:  Verbal   Consent given by:  Patient Pre-procedure details:    Sensation:  Normal Procedure details:    Laterality:  Right   Location:  Wrist   Wrist:  R wrist   Splint type:  Sugar tong   Supplies:  Ortho-Glass Post-procedure details:    Pain:  Improved   Sensation:  Normal   Patient  tolerance of procedure:  Tolerated well, no immediate complications Comments:     Hematoma block was used with approximately 15 cc of 1% lidocaine without epinephrine with bupivacaine mixed as well.   ____________________________________________   RADIOLOGY Images were viewed by me  Right wrist x-ray, left knee x-ray IMPRESSION: 1. Comminuted and significantly displaced fracture of the distal radial metaphysis, with shortening, more than 1/2 shaft width dorsal displacement and dorsal angulation. Displaced fracture lines extend to the radiocarpal joint. 2. Somewhat prominent displaced ulnar styloid fragment. IMPRESSION: Mildly comminuted horizontal fracture through the patella, extending just superior to the inferior patellar hardware. Small knee joint effusion noted.  ____________________________________________  DIFFERENTIAL DIAGNOSIS   Fracture, contusion, dislocation, abrasion  FINAL ASSESSMENT AND PLAN  Distal radius fracture, ulnar styloid fracture, patella fracture   Plan: The patient had presented for mechanical fall. Patient's imaging did reveal fractures as dictated above.  I did use a hematoma block to better reduce the right wrist.  It appears  better reduce clinically.  I discussed case with orthopedics and Dr. Rudene Christians had operated on her her knee in the past.  He feels like most of the patellar findings are chronic and not acute.  She is able to ambulate.  We placed her in a sugar tong type splint for her wrist and forearm and placed her in a sling.  She will follow-up with Dr. Rudene Christians in the morning.   Laurence Aly, MD   Note: This note was generated in part or whole with voice recognition software. Voice recognition is usually quite accurate but there are transcription errors that can and very often do occur. I apologize for any typographical errors that were not detected and corrected.     Earleen Newport, MD 05/02/18 [phone]

## 2018-05-02 NOTE — ED Triage Notes (Signed)
Pt arrives POV to triage with c/o fall around 2015. Pt has noted deformity to right wrist at this time.

## 2018-05-03 ENCOUNTER — Encounter
Admission: RE | Admit: 2018-05-03 | Discharge: 2018-05-03 | Disposition: A | Discharge status: Home / Self Care | Payer: PPO | Source: Ambulatory Visit | Attending: Orthopedic Surgery | Admitting: Orthopedic Surgery

## 2018-05-03 DIAGNOSIS — I509 Heart failure, unspecified: Secondary | ICD-10-CM | POA: Diagnosis not present

## 2018-05-03 DIAGNOSIS — Z853 Personal history of malignant neoplasm of breast: Secondary | ICD-10-CM | POA: Diagnosis not present

## 2018-05-03 DIAGNOSIS — Z79810 Long term (current) use of selective estrogen receptor modulators (SERMs): Secondary | ICD-10-CM | POA: Diagnosis not present

## 2018-05-03 DIAGNOSIS — S52571A Other intraarticular fracture of lower end of right radius, initial encounter for closed fracture: Secondary | ICD-10-CM | POA: Diagnosis not present

## 2018-05-03 DIAGNOSIS — Z7982 Long term (current) use of aspirin: Secondary | ICD-10-CM | POA: Diagnosis not present

## 2018-05-03 DIAGNOSIS — M1990 Unspecified osteoarthritis, unspecified site: Secondary | ICD-10-CM | POA: Diagnosis not present

## 2018-05-03 DIAGNOSIS — M810 Age-related osteoporosis without current pathological fracture: Secondary | ICD-10-CM | POA: Diagnosis not present

## 2018-05-03 DIAGNOSIS — K219 Gastro-esophageal reflux disease without esophagitis: Secondary | ICD-10-CM | POA: Diagnosis not present

## 2018-05-03 DIAGNOSIS — J449 Chronic obstructive pulmonary disease, unspecified: Secondary | ICD-10-CM | POA: Diagnosis not present

## 2018-05-03 DIAGNOSIS — Z882 Allergy status to sulfonamides status: Secondary | ICD-10-CM | POA: Diagnosis not present

## 2018-05-03 DIAGNOSIS — Z01810 Encounter for preprocedural cardiovascular examination: Secondary | ICD-10-CM | POA: Diagnosis not present

## 2018-05-03 DIAGNOSIS — Z87891 Personal history of nicotine dependence: Secondary | ICD-10-CM | POA: Diagnosis not present

## 2018-05-03 DIAGNOSIS — W1839XA Other fall on same level, initial encounter: Secondary | ICD-10-CM | POA: Diagnosis not present

## 2018-05-03 DIAGNOSIS — Z01812 Encounter for preprocedural laboratory examination: Secondary | ICD-10-CM | POA: Diagnosis not present

## 2018-05-03 DIAGNOSIS — S82042A Displaced comminuted fracture of left patella, initial encounter for closed fracture: Secondary | ICD-10-CM | POA: Diagnosis not present

## 2018-05-03 DIAGNOSIS — I10 Essential (primary) hypertension: Secondary | ICD-10-CM | POA: Diagnosis not present

## 2018-05-03 DIAGNOSIS — Z79899 Other long term (current) drug therapy: Secondary | ICD-10-CM | POA: Diagnosis not present

## 2018-05-03 DIAGNOSIS — I110 Hypertensive heart disease with heart failure: Secondary | ICD-10-CM | POA: Diagnosis not present

## 2018-05-03 DIAGNOSIS — Y92007 Garden or yard of unspecified non-institutional (private) residence as the place of occurrence of the external cause: Secondary | ICD-10-CM | POA: Diagnosis not present

## 2018-05-03 DIAGNOSIS — Z7951 Long term (current) use of inhaled steroids: Secondary | ICD-10-CM | POA: Diagnosis not present

## 2018-05-03 DIAGNOSIS — Y9389 Activity, other specified: Secondary | ICD-10-CM | POA: Diagnosis not present

## 2018-05-03 DIAGNOSIS — S52611A Displaced fracture of right ulna styloid process, initial encounter for closed fracture: Secondary | ICD-10-CM | POA: Diagnosis not present

## 2018-05-03 DIAGNOSIS — S52501A Unspecified fracture of the lower end of right radius, initial encounter for closed fracture: Secondary | ICD-10-CM | POA: Diagnosis not present

## 2018-05-03 DIAGNOSIS — Z885 Allergy status to narcotic agent status: Secondary | ICD-10-CM | POA: Diagnosis not present

## 2018-05-03 HISTORY — DX: Unspecified dementia, unspecified severity, without behavioral disturbance, psychotic disturbance, mood disturbance, and anxiety: F03.90

## 2018-05-03 HISTORY — DX: Unspecified osteoarthritis, unspecified site: M19.90

## 2018-05-03 HISTORY — DX: Hypoxemia: R09.02

## 2018-05-03 HISTORY — DX: Heart failure, unspecified: I50.9

## 2018-05-03 HISTORY — DX: Gastro-esophageal reflux disease without esophagitis: K21.9

## 2018-05-03 HISTORY — DX: Emphysema, unspecified: J43.9

## 2018-05-03 HISTORY — DX: Other dysphagia: R13.19

## 2018-05-03 HISTORY — DX: Personal history of other diseases of the digestive system: Z87.19

## 2018-05-03 LAB — BASIC METABOLIC PANEL
ANION GAP: 9 (ref 5–15)
BUN: 11 mg/dL (ref 6–20)
CHLORIDE: 93 mmol/L — AB (ref 101–111)
CO2: 28 mmol/L (ref 22–32)
Calcium: 9.5 mg/dL (ref 8.9–10.3)
Creatinine, Ser: 0.68 mg/dL (ref 0.44–1.00)
GFR calc Af Amer: >60 mL/min (ref >60)
GFR calc non Af Amer: >60 mL/min (ref >60)
GLUCOSE: 140 mg/dL — AB (ref 65–99)
POTASSIUM: 4.5 mmol/L (ref 3.5–5.1)
Sodium: 130 mmol/L — ABNORMAL LOW (ref 135–145)

## 2018-05-03 LAB — CBC
HEMATOCRIT: 39.8 % (ref 35.0–47.0)
Hemoglobin: 13.6 g/dL (ref 12.0–16.0)
MCH: 31.6 pg (ref 26.0–34.0)
MCHC: 34.3 g/dL (ref 32.0–36.0)
MCV: 92.2 fL (ref 80.0–100.0)
Platelets: 122 10*3/uL — ABNORMAL LOW (ref 150–440)
RBC: 4.31 MIL/uL (ref 3.80–5.20)
RDW: 13.6 % (ref 11.5–14.5)
WBC: 5.8 10*3/uL (ref 3.6–11.0)

## 2018-05-03 MED ORDER — CEFAZOLIN SODIUM-DEXTROSE 2-4 GM/100ML-% IV SOLN
2.0000 g | Freq: Once | INTRAVENOUS | Status: AC
  Administered 2018-05-04: 2 g via INTRAVENOUS

## 2018-05-03 NOTE — Patient Instructions (Signed)
Your procedure is scheduled on: Tuesday, June 4TH  Report to [place]. DO NOT STOP ON THE FIRST FLOOR         TO REGISTER  PLEASE ARRIVE IN THE SAME DAY SURGERY AREA AT 3:30 PM  Remember: Instructions that are not followed completely may result in serious medical risk,  up to and including death, or upon the discretion of your surgeon and anesthesiologist your  surgery may need to be rescheduled.     _X__ 1. Do not eat food after midnight the night before your procedure.                 No gum chewing or hard candies.                   YOU MAY HAVE A LIGHT BREAKFAST UP  UNTIL 8 AM.                  You may drink clear liquids up to 2 hours                 before you are scheduled to arrive for your surgery- THAT IS UP UNTIL 1:30 PM YOU MAY [place].                  DO not drink clear liquids within 2 hours of the start of your surgery.                  Clear Liquids include:  water, apple juice without pulp, clear carbohydrate                 drink such as Clearfast of Gatorade, Black Coffee or Tea (Do not add                 anything to coffee or tea).  __X__2.  On the morning of surgery brush your teeth with toothpaste and water,                    you may rinse your mouth with mouthwash if you wish.                        Do not swallow anY  toothpaste of mouthwash.     _X__ 3.  No Alcohol for 24 hours before or after surgery.   _X__ 4.  Do Not Smoke or use e-cigarettes For 24 Hours Prior to Your Surgery.                 Do not use any chewable tobacco products for at least 6 hours prior to                 surgery.  ____  5.  Bring all medications with you on the day of surgery if instructed.   ____  6.  Notify your doctor if there is any change in your medical condition      (cold, fever, infections).     Do not wear jewelry, make-up, hairpins, clips or nail polish. Do not wear  lotions, powders, or perfumes. You may wear deodorant. Do not shave 48 hours prior to surgery. Men may shave face and neck. Do not bring valuables to the hospital.    [facility] is not responsible for any belongings  or valuables.  Contacts, dentures or bridgework may not be worn into surgery. Leave your suitcase in the car. After surgery it may be brought to your room. For patients admitted to the hospital, discharge time is determined by your treatment team.   Patients discharged the day of surgery will not be allowed to drive home.   Please read over the following fact sheets that you were given:   PREPARING FOR SURGERY  ____ Take these medicines the morning of surgery with A SIP OF WATER:    1. SPIRIVA  2. ADVAIR  3.  HYDRALAZINE  (TAKE A DOSE TONIGHT AS WELL)  4. COREG/CARVEDILOL  (TAKE A DOSE TONIGHT AS WELL)  5. CLONIDINE  6. ALTACE             7. EVISTA  ____ Fleet Enema (as directed)   __X__ Use SAGE WIPES as directed  __X__ Use inhalers on the day of surgery  __X__ Stop ASPIRIN TODAY. DO NOT TAKE ON THE MORNING OF SURGERY  __X__ Stop Anti-inflammatories TODAY.   __X__ Stop supplements until after surgery.                 THIS INCLUDES CALCIUM / MULTIVITS / VITAMIN C.  DO NOT TAKE TONIGHT OR                    TOMORROW  ____ Bring C-Pap to the hospital.     DO NOT TAKE LASIX AND POTASSIUM IN THE MORNING OR TONIGHT.  WEAR A LOOSE FITTING SHIRT TO BE ABLE TO FIT A CAST THRU THE SLEEVE               **YOU MAY TAKE TYLENOL AT ANY TIME PRIOR TO SURGERY**                        DO NOT TAKE MORE THAN 4000MG  IN 24 HOUR WINDOW OF TIME   IF YOU TAKE PERCOCET TONIGHT PLEASE HAVE SOMETHING IN YOUR STOMACH FIRST.

## 2018-05-03 NOTE — OR Nursing (Addendum)
Reviewed medical history with Dr. Ronelle Nigh and her current respiratory status. O2 sat during preadmit interview remained 84-91%. Patient does not use oxygen at home. Did not present with any SOB but sat in a wheelchair for duration of interview. Per Dr. Ronelle Nigh, patient needs a clearance from Dr. Fleming(pulmonary) prior to surgery tomorrow. Request for clearance faxed to Dr. Raul Del and to Dr. Rudene Christians office (for an [place]).

## 2018-05-04 ENCOUNTER — Ambulatory Visit: Payer: PPO

## 2018-05-04 ENCOUNTER — Encounter: Payer: Self-pay | Admitting: Anesthesiology

## 2018-05-04 ENCOUNTER — Ambulatory Visit: Payer: PPO | Admitting: Anesthesiology

## 2018-05-04 ENCOUNTER — Encounter
Admission: RE | Disposition: A | Discharge status: Home / Self Care | Payer: Self-pay | Source: Ambulatory Visit | Attending: Orthopedic Surgery

## 2018-05-04 ENCOUNTER — Other Ambulatory Visit: Payer: Self-pay

## 2018-05-04 ENCOUNTER — Ambulatory Visit
Admission: RE | Admit: 2018-05-04 | Discharge: 2018-05-04 | Disposition: A | Discharge status: Home / Self Care | Payer: PPO | Source: Ambulatory Visit | Attending: Orthopedic Surgery | Admitting: Orthopedic Surgery

## 2018-05-04 DIAGNOSIS — Z87891 Personal history of nicotine dependence: Secondary | ICD-10-CM | POA: Insufficient documentation

## 2018-05-04 DIAGNOSIS — Z853 Personal history of malignant neoplasm of breast: Secondary | ICD-10-CM | POA: Insufficient documentation

## 2018-05-04 DIAGNOSIS — S52611A Displaced fracture of right ulna styloid process, initial encounter for closed fracture: Secondary | ICD-10-CM | POA: Insufficient documentation

## 2018-05-04 DIAGNOSIS — I5021 Acute systolic (congestive) heart failure: Secondary | ICD-10-CM | POA: Diagnosis not present

## 2018-05-04 DIAGNOSIS — Z01818 Encounter for other preprocedural examination: Secondary | ICD-10-CM | POA: Diagnosis not present

## 2018-05-04 DIAGNOSIS — Z01812 Encounter for preprocedural laboratory examination: Secondary | ICD-10-CM | POA: Insufficient documentation

## 2018-05-04 DIAGNOSIS — J449 Chronic obstructive pulmonary disease, unspecified: Secondary | ICD-10-CM | POA: Diagnosis not present

## 2018-05-04 DIAGNOSIS — M1990 Unspecified osteoarthritis, unspecified site: Secondary | ICD-10-CM | POA: Diagnosis not present

## 2018-05-04 DIAGNOSIS — Z9889 Other specified postprocedural states: Secondary | ICD-10-CM

## 2018-05-04 DIAGNOSIS — Z7982 Long term (current) use of aspirin: Secondary | ICD-10-CM | POA: Insufficient documentation

## 2018-05-04 DIAGNOSIS — E871 Hypo-osmolality and hyponatremia: Secondary | ICD-10-CM | POA: Diagnosis not present

## 2018-05-04 DIAGNOSIS — M810 Age-related osteoporosis without current pathological fracture: Secondary | ICD-10-CM | POA: Insufficient documentation

## 2018-05-04 DIAGNOSIS — Z79810 Long term (current) use of selective estrogen receptor modulators (SERMs): Secondary | ICD-10-CM | POA: Insufficient documentation

## 2018-05-04 DIAGNOSIS — W1839XA Other fall on same level, initial encounter: Secondary | ICD-10-CM | POA: Insufficient documentation

## 2018-05-04 DIAGNOSIS — I110 Hypertensive heart disease with heart failure: Secondary | ICD-10-CM | POA: Diagnosis not present

## 2018-05-04 DIAGNOSIS — S62101A Fracture of unspecified carpal bone, right wrist, initial encounter for closed fracture: Secondary | ICD-10-CM | POA: Diagnosis not present

## 2018-05-04 DIAGNOSIS — E860 Dehydration: Secondary | ICD-10-CM | POA: Diagnosis not present

## 2018-05-04 DIAGNOSIS — S52571A Other intraarticular fracture of lower end of right radius, initial encounter for closed fracture: Secondary | ICD-10-CM | POA: Diagnosis not present

## 2018-05-04 DIAGNOSIS — Z79899 Other long term (current) drug therapy: Secondary | ICD-10-CM | POA: Insufficient documentation

## 2018-05-04 DIAGNOSIS — Z8781 Personal history of (healed) traumatic fracture: Secondary | ICD-10-CM

## 2018-05-04 DIAGNOSIS — K219 Gastro-esophageal reflux disease without esophagitis: Secondary | ICD-10-CM | POA: Diagnosis not present

## 2018-05-04 DIAGNOSIS — S52591D Other fractures of lower end of right radius, subsequent encounter for closed fracture with routine healing: Secondary | ICD-10-CM | POA: Diagnosis not present

## 2018-05-04 DIAGNOSIS — Z7951 Long term (current) use of inhaled steroids: Secondary | ICD-10-CM | POA: Insufficient documentation

## 2018-05-04 DIAGNOSIS — F0390 Unspecified dementia without behavioral disturbance: Secondary | ICD-10-CM | POA: Diagnosis not present

## 2018-05-04 DIAGNOSIS — Z885 Allergy status to narcotic agent status: Secondary | ICD-10-CM | POA: Insufficient documentation

## 2018-05-04 DIAGNOSIS — I739 Peripheral vascular disease, unspecified: Secondary | ICD-10-CM | POA: Diagnosis not present

## 2018-05-04 DIAGNOSIS — E876 Hypokalemia: Secondary | ICD-10-CM | POA: Diagnosis not present

## 2018-05-04 DIAGNOSIS — I509 Heart failure, unspecified: Secondary | ICD-10-CM | POA: Insufficient documentation

## 2018-05-04 DIAGNOSIS — Z882 Allergy status to sulfonamides status: Secondary | ICD-10-CM | POA: Insufficient documentation

## 2018-05-04 DIAGNOSIS — Z01810 Encounter for preprocedural cardiovascular examination: Secondary | ICD-10-CM | POA: Insufficient documentation

## 2018-05-04 DIAGNOSIS — K449 Diaphragmatic hernia without obstruction or gangrene: Secondary | ICD-10-CM | POA: Diagnosis not present

## 2018-05-04 DIAGNOSIS — Y92007 Garden or yard of unspecified non-institutional (private) residence as the place of occurrence of the external cause: Secondary | ICD-10-CM | POA: Insufficient documentation

## 2018-05-04 DIAGNOSIS — Y9389 Activity, other specified: Secondary | ICD-10-CM | POA: Insufficient documentation

## 2018-05-04 HISTORY — PX: ORIF WRIST FRACTURE: SHX2133

## 2018-05-04 SURGERY — OPEN REDUCTION INTERNAL FIXATION (ORIF) WRIST FRACTURE
Anesthesia: General | Site: Wrist | Laterality: Right | Wound class: "Clean "

## 2018-05-04 MED ORDER — PROPOFOL 10 MG/ML IV BOLUS
INTRAVENOUS | Status: AC
  Filled 2018-05-04: qty 20

## 2018-05-04 MED ORDER — DEXAMETHASONE SODIUM PHOSPHATE 10 MG/ML IJ SOLN
INTRAMUSCULAR | Status: AC
  Filled 2018-05-04: qty 1

## 2018-05-04 MED ORDER — GLYCOPYRROLATE 0.2 MG/ML IJ SOLN
INTRAMUSCULAR | Status: AC
  Filled 2018-05-04: qty 1

## 2018-05-04 MED ORDER — FAMOTIDINE 20 MG PO TABS
ORAL_TABLET | ORAL | Status: AC
  Filled 2018-05-04: qty 1

## 2018-05-04 MED ORDER — ONDANSETRON HCL 4 MG/2ML IJ SOLN
INTRAMUSCULAR | Status: DC | PRN
  Administered 2018-05-04: 4 mg via INTRAVENOUS

## 2018-05-04 MED ORDER — SODIUM CHLORIDE FLUSH 0.9 % IV SOLN
INTRAVENOUS | Status: AC
  Filled 2018-05-04: qty 10

## 2018-05-04 MED ORDER — CEFAZOLIN SODIUM-DEXTROSE 2-4 GM/100ML-% IV SOLN
INTRAVENOUS | Status: AC
  Filled 2018-05-04: qty 100

## 2018-05-04 MED ORDER — PHENYLEPHRINE HCL 10 MG/ML IJ SOLN
INTRAMUSCULAR | Status: DC | PRN
  Administered 2018-05-04: 50 ug via INTRAVENOUS

## 2018-05-04 MED ORDER — PROMETHAZINE HCL 25 MG/ML IJ SOLN
6.2500 mg | Freq: Once | INTRAMUSCULAR | Status: AC
  Administered 2018-05-04: 6.25 mg via INTRAVENOUS

## 2018-05-04 MED ORDER — FENTANYL CITRATE (PF) 100 MCG/2ML IJ SOLN
INTRAMUSCULAR | Status: DC | PRN
  Administered 2018-05-04 (×2): 25 ug via INTRAVENOUS
  Administered 2018-05-04: 50 ug via INTRAVENOUS

## 2018-05-04 MED ORDER — GLYCOPYRROLATE 0.2 MG/ML IJ SOLN
INTRAMUSCULAR | Status: DC | PRN
  Administered 2018-05-04: 0.2 mg via INTRAVENOUS

## 2018-05-04 MED ORDER — ONDANSETRON HCL 4 MG/2ML IJ SOLN
INTRAMUSCULAR | Status: AC
  Filled 2018-05-04: qty 2

## 2018-05-04 MED ORDER — LACTATED RINGERS IV SOLN
INTRAVENOUS | Status: DC
  Administered 2018-05-04: 16:00:00 via INTRAVENOUS

## 2018-05-04 MED ORDER — FENTANYL CITRATE (PF) 100 MCG/2ML IJ SOLN
INTRAMUSCULAR | Status: AC
  Administered 2018-05-04: 25 ug via INTRAVENOUS
  Filled 2018-05-04: qty 2

## 2018-05-04 MED ORDER — PROMETHAZINE HCL 25 MG/ML IJ SOLN
INTRAMUSCULAR | Status: AC
  Administered 2018-05-04: 6.25 mg via INTRAVENOUS
  Filled 2018-05-04: qty 1

## 2018-05-04 MED ORDER — FENTANYL CITRATE (PF) 100 MCG/2ML IJ SOLN
25.0000 ug | INTRAMUSCULAR | Status: DC | PRN
  Administered 2018-05-04 (×5): 25 ug via INTRAVENOUS

## 2018-05-04 MED ORDER — ONDANSETRON HCL 4 MG/2ML IJ SOLN
INTRAMUSCULAR | Status: AC
  Administered 2018-05-04: 4 mg via INTRAVENOUS
  Filled 2018-05-04: qty 2

## 2018-05-04 MED ORDER — ONDANSETRON HCL 4 MG/2ML IJ SOLN
4.0000 mg | Freq: Once | INTRAMUSCULAR | Status: AC | PRN
  Administered 2018-05-04: 4 mg via INTRAVENOUS

## 2018-05-04 MED ORDER — PROPOFOL 10 MG/ML IV BOLUS
INTRAVENOUS | Status: DC | PRN
  Administered 2018-05-04: 30 mg via INTRAVENOUS
  Administered 2018-05-04: 80 mg via INTRAVENOUS

## 2018-05-04 MED ORDER — FAMOTIDINE 20 MG PO TABS
20.0000 mg | ORAL_TABLET | Freq: Once | ORAL | Status: AC
  Administered 2018-05-04: 20 mg via ORAL

## 2018-05-04 MED ORDER — NEOMYCIN-POLYMYXIN B GU 40-200000 IR SOLN
Status: DC | PRN
  Administered 2018-05-04: 2 mL

## 2018-05-04 MED ORDER — FENTANYL CITRATE (PF) 100 MCG/2ML IJ SOLN
INTRAMUSCULAR | Status: AC
  Filled 2018-05-04: qty 2

## 2018-05-04 MED ORDER — DEXAMETHASONE SODIUM PHOSPHATE 10 MG/ML IJ SOLN
INTRAMUSCULAR | Status: DC | PRN
  Administered 2018-05-04: 10 mg via INTRAVENOUS

## 2018-05-04 SURGICAL SUPPLY — 40 items
BANDAGE ACE 4X5 VEL STRL LF (GAUZE/BANDAGES/DRESSINGS) ×3 IMPLANT
BANDAGE ELASTIC 4 LF NS (GAUZE/BANDAGES/DRESSINGS) ×3 IMPLANT
CANISTER SUCT 1200ML W/VALVE (MISCELLANEOUS) ×3 IMPLANT
CHLORAPREP W/TINT 26ML (MISCELLANEOUS) ×3 IMPLANT
CUFF TOURN 18 STER (MISCELLANEOUS) ×2 IMPLANT
DRAPE FLUOR MINI C-ARM 54X84 (DRAPES) ×3 IMPLANT
DRSG EMULSION OIL 3X8 NADH (GAUZE/BANDAGES/DRESSINGS) ×2 IMPLANT
ELECT CAUTERY BLADE 6.4 (BLADE) ×3 IMPLANT
ELECT REM PT RETURN 9FT ADLT (ELECTROSURGICAL) ×3
ELECTRODE REM PT RTRN 9FT ADLT (ELECTROSURGICAL) ×1 IMPLANT
GAUZE PETRO XEROFOAM 1X8 (MISCELLANEOUS) ×3 IMPLANT
GAUZE SPONGE 4X4 12PLY STRL (GAUZE/BANDAGES/DRESSINGS) ×3 IMPLANT
GAUZE XEROFORM 4X4 STRL (GAUZE/BANDAGES/DRESSINGS) ×2 IMPLANT
GLOVE SURG SYN 9.0  PF PI (GLOVE) ×2
GLOVE SURG SYN 9.0 PF PI (GLOVE) ×1 IMPLANT
GOWN SRG 2XL LVL 4 RGLN SLV (GOWNS) ×1 IMPLANT
GOWN STRL NON-REIN 2XL LVL4 (GOWNS) ×2
GOWN STRL REUS W/ TWL LRG LVL3 (GOWN DISPOSABLE) ×1 IMPLANT
GOWN STRL REUS W/TWL LRG LVL3 (GOWN DISPOSABLE) ×2
KIT TURNOVER KIT A (KITS) ×3 IMPLANT
NDL FILTER BLUNT 18X1 1/2 (NEEDLE) ×1 IMPLANT
NEEDLE FILTER BLUNT 18X 1/2SAF (NEEDLE) ×2
NEEDLE FILTER BLUNT 18X1 1/2 (NEEDLE) ×1 IMPLANT
NS IRRIG 500ML POUR BTL (IV SOLUTION) ×3 IMPLANT
PACK EXTREMITY ARMC (MISCELLANEOUS) ×3 IMPLANT
PAD CAST CTTN 4X4 STRL (SOFTGOODS) ×1 IMPLANT
PADDING CAST COTTON 4X4 STRL (SOFTGOODS) ×2
PEG SUBCHONDRAL SMOOTH 2.0X16 (Peg) ×2 IMPLANT
PEG SUBCHONDRAL SMOOTH 2.0X22 (Peg) ×4 IMPLANT
PEG SUBCHONDRAL SMOOTH 2.0X24 (Peg) ×6 IMPLANT
PLATE SHORT 21.6X48.9 NRRW RT (Plate) ×2 IMPLANT
SCREW BN 12X3.5XNS CORT TI (Screw) IMPLANT
SCREW CORT 3.5X10 LNG (Screw) ×4 IMPLANT
SCREW CORT 3.5X12 (Screw) ×2 IMPLANT
SPLINT CAST 1 STEP 3X12 (MISCELLANEOUS) ×3 IMPLANT
SUT ETHILON 4-0 (SUTURE) ×2
SUT ETHILON 4-0 FS2 18XMFL BLK (SUTURE) ×1
SUT VICRYL 3-0 27IN (SUTURE) ×3 IMPLANT
SUTURE ETHLN 4-0 FS2 18XMF BLK (SUTURE) ×1 IMPLANT
SYR 3ML LL SCALE MARK (SYRINGE) ×3 IMPLANT

## 2018-05-04 NOTE — Discharge Instructions (Addendum)
Work on finger range of motion is much as possible.  Keep arm elevated to get swelling down.  Okay to work on elbow range of motion as well.  If having any problems call [place]   1) The drugs that you were given will stay in your system until tomorrow so for the next 24 hours you should not:  A) Drive an automobile B) Make any legal decisions C) Drink any alcoholic beverage   2) You may resume regular meals tomorrow.  Today it is better to start with liquids and gradually work up to solid foods.  You may eat anything you prefer, but it is better to start with liquids, then soup and crackers, and gradually work up to solid foods.   3) Please notify your doctor immediately if you have any unusual bleeding, trouble breathing, redness and pain at the surgery site, drainage, fever, or pain not relieved by medication.    4) Additional Instructions:    Please contact your physician with any problems or Same Day Surgery at [phone], Monday through Friday 6 am to 4 pm, or [place] at [facility] number at [phone].

## 2018-05-04 NOTE — H&P (Signed)
Reviewed paper H+P, will be scanned into chart. No changes noted.  

## 2018-05-04 NOTE — Transfer of Care (Signed)
Immediate Anesthesia Transfer of Care Note  Patient: Melanie Huffman  Procedure(s) Performed: OPEN REDUCTION INTERNAL FIXATION (ORIF) WRIST FRACTURE (Right Wrist)  Patient Location: PACU  Anesthesia Type:General  Level of Consciousness: drowsy and patient cooperative  Airway & Oxygen Therapy: Patient Spontanous Breathing and Patient connected to nasal cannula oxygen  Post-op Assessment: Report given to RN and Post -op Vital signs reviewed and stable  Post vital signs: Reviewed and stable  Last Vitals:  Vitals Value Taken Time  BP 112/62 05/04/2018  6:37 PM  Temp 37.2 C 05/04/2018  6:37 PM  Pulse 71 05/04/2018  6:41 PM  Resp 17 05/04/2018  6:41 PM  SpO2 96 % 05/04/2018  6:41 PM  Vitals shown include unvalidated device data.  Last Pain:  Vitals:   05/04/18 1837  TempSrc:   PainSc: Asleep         Complications: No apparent anesthesia complications

## 2018-05-04 NOTE — Op Note (Signed)
05/04/2018  6:36 PM  PATIENT:  Melanie Huffman  84 y.o. female  PRE-OPERATIVE DIAGNOSIS:  WRIST FRACTURE RIGHT comminuted intra-articular 3 fragments  POST-OPERATIVE DIAGNOSIS:  WRIST FRACTURE RIGHT same  PROCEDURE:  Procedure(s): OPEN REDUCTION INTERNAL FIXATION (ORIF) WRIST FRACTURE (Right)  SURGEON: Laurene Footman, MD  ASSISTANTS: None  ANESTHESIA:   general  EBL:  Total I/O In: -  Out: 10 [Blood:10]  BLOOD ADMINISTERED:none  DRAINS: none   LOCAL MEDICATIONS USED:  NONE  SPECIMEN:  No Specimen  DISPOSITION OF SPECIMEN:  N/A  COUNTS:  YES  TOURNIQUET:  * Missing tourniquet times found for documented tourniquets in log: [id number] *90 minutes at 250 mmHg  IMPLANTS: Biomet short narrow DVR with multiple pegs and screws  DICTATION: .Dragon Dictation patient brought the operating room and after adequate anesthesia was obtained the right arm was prepped and draped you sterile fashion.  After patient verification timeout procedures were completed tourniquet was raised.  A volar approach was made centered over the FCR tendon with the tendon sheath incised and the tendon retracted radially protecting the radial artery and vessels the pronator had been partially torn off the bone distally and it off the shaft and this was elevated further proximally to provide exposure the fracture and was the volar surface of the radius for plating.  Traction had been applied at the start of the surgery and this helped restore length but the dorsal tilt remained because of this distal first technique was utilized with the plate applied to the distal fragment and multiple pegs placed then bring the plate down to the shaft essentially anatomic alignment was obtained 3 cortical screws were placed in traction was removed and the fracture appeared stable under fluoroscopic views with no penetration into the joint.  The wound was thoroughly irrigated and tourniquet let down the wound is closed with 3-0 Vicryl  subcutaneously and 4-0 nylon for the skin followed by Xeroform 4 x 4's web roll and Ace wrap along with Adaptic around the forearm with she had some skin tears and had oozing from the skin.  A volar splint was applied after the web roll  PLAN OF CARE: Discharge to home after PACU  PATIENT DISPOSITION:  PACU - hemodynamically stable.

## 2018-05-04 NOTE — Anesthesia Post-op Follow-up Note (Signed)
Anesthesia QCDR form completed.        

## 2018-05-04 NOTE — Pre-Procedure Instructions (Signed)
SPOKE WITH ASHLEY AT DR Raul Del. PATIENT BEING SEEN 05/06/18. NOTIFIED CASEY AT DR [place] FAXED COPY OF CLEARANCE REQUEST TO [place]. PATIENT SURGERY NOW 05/11/18

## 2018-05-04 NOTE — Anesthesia Preprocedure Evaluation (Signed)
Anesthesia Evaluation  Patient identified by MRN, date of birth, ID band Patient awake    Reviewed: Allergy & Precautions, NPO status , Patient's Chart, lab work & pertinent test results, reviewed documented beta blocker date and time   Airway Mallampati: II  TM Distance: >3 FB     Dental  (+) Chipped   Pulmonary shortness of breath, asthma , COPD, former smoker,           Cardiovascular hypertension, Pt. on medications and Pt. on home beta blockers + Peripheral Vascular Disease and +CHF  + Valvular Problems/Murmurs      Neuro/Psych PSYCHIATRIC DISORDERS Dementia    GI/Hepatic hiatal hernia, GERD  Controlled,  Endo/Other    Renal/GU Renal disease     Musculoskeletal  (+) Arthritis ,   Abdominal   Peds  Hematology   Anesthesia Other Findings Denies reflux. Low sats 86-91. Subdural hematoma 5 yrs ago. No surgery needed. Breakfast at 8 am. Echo 55-60. PACs.  Reproductive/Obstetrics                             Anesthesia Physical Anesthesia Plan  ASA: IV  Anesthesia Plan: General   Post-op Pain Management:    Induction: Intravenous  PONV Risk Score and Plan:   Airway Management Planned: LMA  Additional Equipment:   Intra-op Plan:   Post-operative Plan:   Informed Consent: I have reviewed the patients History and Physical, chart, labs and discussed the procedure including the risks, benefits and alternatives for the proposed anesthesia with the patient or authorized representative who has indicated his/her understanding and acceptance.     Plan Discussed with: CRNA  Anesthesia Plan Comments:         Anesthesia Quick Evaluation

## 2018-05-04 NOTE — Anesthesia Procedure Notes (Signed)
Procedure Name: LMA Insertion Date/Time: 05/04/2018 5:41 PM Performed by: Jonna Clark, CRNA Pre-anesthesia Checklist: Patient identified, Patient being monitored, Timeout performed, Emergency Drugs available and Suction available Patient Re-evaluated:Patient Re-evaluated prior to induction Oxygen Delivery Method: Circle system utilized Preoxygenation: Pre-oxygenation with 100% oxygen Induction Type: IV induction Ventilation: Mask ventilation without difficulty LMA: LMA inserted LMA Size: 3.0 Tube type: Oral Number of attempts: 1 Placement Confirmation: positive ETCO2 and breath sounds checked- equal and bilateral Tube secured with: Tape Dental Injury: Teeth and Oropharynx as per pre-operative assessment

## 2018-05-05 ENCOUNTER — Encounter: Payer: Self-pay | Admitting: Orthopedic Surgery

## 2018-05-05 NOTE — Anesthesia Postprocedure Evaluation (Signed)
Anesthesia Post Note  Patient: Melanie Huffman  Procedure(s) Performed: OPEN REDUCTION INTERNAL FIXATION (ORIF) WRIST FRACTURE (Right Wrist)  Patient location during evaluation: PACU Anesthesia Type: General Level of consciousness: awake and alert Pain management: pain level controlled Vital Signs Assessment: post-procedure vital signs reviewed and stable Respiratory status: spontaneous breathing, nonlabored ventilation, respiratory function stable and patient connected to nasal cannula oxygen Cardiovascular status: blood pressure returned to baseline and stable Postop Assessment: no apparent nausea or vomiting Anesthetic complications: no     Last Vitals:  Vitals:   05/04/18 1937 05/04/18 2006  BP: (!) 167/75 (!) 177/97  Pulse: 68 83  Resp: 15 16  Temp: 37.1 C 37.2 C  SpO2: 91% (!) 89%    Last Pain:  Vitals:   05/04/18 2006  TempSrc: Tympanic  PainSc: [place]

## 2018-05-11 DIAGNOSIS — R630 Anorexia: Secondary | ICD-10-CM | POA: Diagnosis not present

## 2018-05-11 DIAGNOSIS — G4700 Insomnia, unspecified: Secondary | ICD-10-CM | POA: Diagnosis not present

## 2018-05-11 DIAGNOSIS — I10 Essential (primary) hypertension: Secondary | ICD-10-CM | POA: Diagnosis not present

## 2018-05-11 DIAGNOSIS — Z9181 History of falling: Secondary | ICD-10-CM | POA: Diagnosis not present

## 2018-05-21 DIAGNOSIS — S52501D Unspecified fracture of the lower end of right radius, subsequent encounter for closed fracture with routine healing: Secondary | ICD-10-CM | POA: Diagnosis not present

## 2018-06-04 ENCOUNTER — Emergency Department: Payer: PPO

## 2018-06-04 ENCOUNTER — Inpatient Hospital Stay
Admission: EM | Admit: 2018-06-04 | Discharge: 2018-06-09 | DRG: 643 | Disposition: A | Discharge status: Home Health | Payer: PPO | Attending: Internal Medicine | Admitting: Internal Medicine

## 2018-06-04 ENCOUNTER — Encounter: Payer: Self-pay | Admitting: Emergency Medicine

## 2018-06-04 ENCOUNTER — Other Ambulatory Visit: Payer: Self-pay

## 2018-06-04 DIAGNOSIS — J9601 Acute respiratory failure with hypoxia: Secondary | ICD-10-CM | POA: Diagnosis not present

## 2018-06-04 DIAGNOSIS — R0902 Hypoxemia: Secondary | ICD-10-CM | POA: Diagnosis not present

## 2018-06-04 DIAGNOSIS — Z87891 Personal history of nicotine dependence: Secondary | ICD-10-CM | POA: Diagnosis not present

## 2018-06-04 DIAGNOSIS — E222 Syndrome of inappropriate secretion of antidiuretic hormone: Principal | ICD-10-CM | POA: Diagnosis present

## 2018-06-04 DIAGNOSIS — R4182 Altered mental status, unspecified: Secondary | ICD-10-CM | POA: Diagnosis not present

## 2018-06-04 DIAGNOSIS — M542 Cervicalgia: Secondary | ICD-10-CM | POA: Diagnosis not present

## 2018-06-04 DIAGNOSIS — Z79899 Other long term (current) drug therapy: Secondary | ICD-10-CM | POA: Diagnosis not present

## 2018-06-04 DIAGNOSIS — Z8249 Family history of ischemic heart disease and other diseases of the circulatory system: Secondary | ICD-10-CM

## 2018-06-04 DIAGNOSIS — E875 Hyperkalemia: Secondary | ICD-10-CM | POA: Diagnosis not present

## 2018-06-04 DIAGNOSIS — Z9012 Acquired absence of left breast and nipple: Secondary | ICD-10-CM

## 2018-06-04 DIAGNOSIS — I5022 Chronic systolic (congestive) heart failure: Secondary | ICD-10-CM | POA: Diagnosis not present

## 2018-06-04 DIAGNOSIS — S0990XA Unspecified injury of head, initial encounter: Secondary | ICD-10-CM | POA: Diagnosis not present

## 2018-06-04 DIAGNOSIS — K219 Gastro-esophageal reflux disease without esophagitis: Secondary | ICD-10-CM | POA: Diagnosis present

## 2018-06-04 DIAGNOSIS — I509 Heart failure, unspecified: Secondary | ICD-10-CM | POA: Diagnosis not present

## 2018-06-04 DIAGNOSIS — J90 Pleural effusion, not elsewhere classified: Secondary | ICD-10-CM | POA: Diagnosis not present

## 2018-06-04 DIAGNOSIS — R55 Syncope and collapse: Secondary | ICD-10-CM | POA: Diagnosis not present

## 2018-06-04 DIAGNOSIS — Z7982 Long term (current) use of aspirin: Secondary | ICD-10-CM | POA: Diagnosis not present

## 2018-06-04 DIAGNOSIS — I739 Peripheral vascular disease, unspecified: Secondary | ICD-10-CM | POA: Diagnosis not present

## 2018-06-04 DIAGNOSIS — Z66 Do not resuscitate: Secondary | ICD-10-CM | POA: Diagnosis present

## 2018-06-04 DIAGNOSIS — I10 Essential (primary) hypertension: Secondary | ICD-10-CM | POA: Diagnosis not present

## 2018-06-04 DIAGNOSIS — I5023 Acute on chronic systolic (congestive) heart failure: Secondary | ICD-10-CM | POA: Diagnosis not present

## 2018-06-04 DIAGNOSIS — Z90710 Acquired absence of both cervix and uterus: Secondary | ICD-10-CM

## 2018-06-04 DIAGNOSIS — R4020 Unspecified coma: Secondary | ICD-10-CM

## 2018-06-04 DIAGNOSIS — I110 Hypertensive heart disease with heart failure: Secondary | ICD-10-CM | POA: Diagnosis not present

## 2018-06-04 DIAGNOSIS — Z853 Personal history of malignant neoplasm of breast: Secondary | ICD-10-CM

## 2018-06-04 DIAGNOSIS — R7989 Other specified abnormal findings of blood chemistry: Secondary | ICD-10-CM | POA: Diagnosis not present

## 2018-06-04 DIAGNOSIS — R296 Repeated falls: Secondary | ICD-10-CM | POA: Diagnosis present

## 2018-06-04 DIAGNOSIS — E871 Hypo-osmolality and hyponatremia: Secondary | ICD-10-CM | POA: Diagnosis not present

## 2018-06-04 DIAGNOSIS — F0390 Unspecified dementia without behavioral disturbance: Secondary | ICD-10-CM | POA: Diagnosis present

## 2018-06-04 DIAGNOSIS — J449 Chronic obstructive pulmonary disease, unspecified: Secondary | ICD-10-CM | POA: Diagnosis present

## 2018-06-04 DIAGNOSIS — Z7951 Long term (current) use of inhaled steroids: Secondary | ICD-10-CM | POA: Diagnosis not present

## 2018-06-04 DIAGNOSIS — J9621 Acute and chronic respiratory failure with hypoxia: Secondary | ICD-10-CM | POA: Diagnosis not present

## 2018-06-04 DIAGNOSIS — I2510 Atherosclerotic heart disease of native coronary artery without angina pectoris: Secondary | ICD-10-CM | POA: Diagnosis not present

## 2018-06-04 DIAGNOSIS — S199XXA Unspecified injury of neck, initial encounter: Secondary | ICD-10-CM | POA: Diagnosis not present

## 2018-06-04 DIAGNOSIS — E8770 Fluid overload, unspecified: Secondary | ICD-10-CM | POA: Diagnosis not present

## 2018-06-04 DIAGNOSIS — R51 Headache: Secondary | ICD-10-CM | POA: Diagnosis not present

## 2018-06-04 DIAGNOSIS — I6522 Occlusion and stenosis of left carotid artery: Secondary | ICD-10-CM | POA: Diagnosis not present

## 2018-06-04 LAB — TSH: TSH: 3.92 u[IU]/mL (ref 0.350–4.500)

## 2018-06-04 LAB — CBC WITH DIFFERENTIAL/PLATELET
BASOS ABS: 0 10*3/uL (ref 0–0.1)
Basophils Relative: 1 %
Eosinophils Absolute: 0.1 10*3/uL (ref 0–0.7)
Eosinophils Relative: 1 %
HEMATOCRIT: 39.1 % (ref 35.0–47.0)
Hemoglobin: 13.3 g/dL (ref 12.0–16.0)
LYMPHS ABS: 1.8 10*3/uL (ref 1.0–3.6)
LYMPHS PCT: 38 %
MCH: 31.8 pg (ref 26.0–34.0)
MCHC: 33.9 g/dL (ref 32.0–36.0)
MCV: 93.7 fL (ref 80.0–100.0)
MONO ABS: 0.4 10*3/uL (ref 0.2–0.9)
MONOS PCT: 9 %
NEUTROS ABS: 2.4 10*3/uL (ref 1.4–6.5)
Neutrophils Relative %: 51 %
Platelets: 176 10*3/uL (ref 150–440)
RBC: 4.17 MIL/uL (ref 3.80–5.20)
RDW: 14.5 % (ref 11.5–14.5)
WBC: 4.7 10*3/uL (ref 3.6–11.0)

## 2018-06-04 LAB — URINALYSIS, COMPLETE (UACMP) WITH MICROSCOPIC
BACTERIA UA: NONE SEEN
BILIRUBIN URINE: NEGATIVE
GLUCOSE, UA: NEGATIVE mg/dL
HGB URINE DIPSTICK: NEGATIVE
Ketones, ur: NEGATIVE mg/dL
LEUKOCYTES UA: NEGATIVE
NITRITE: NEGATIVE
Protein, ur: 30 mg/dL — AB
SPECIFIC GRAVITY, URINE: 1.016 (ref 1.005–1.030)
pH: 5 (ref 5.0–8.0)

## 2018-06-04 LAB — COMPREHENSIVE METABOLIC PANEL
ALT: 39 U/L (ref 0–44)
AST: 36 U/L (ref 15–41)
Albumin: 3.2 g/dL — ABNORMAL LOW (ref 3.5–5.0)
Alkaline Phosphatase: 97 U/L (ref 38–126)
Anion gap: 9 (ref 5–15)
BILIRUBIN TOTAL: 0.8 mg/dL (ref 0.3–1.2)
BUN: 12 mg/dL (ref 8–23)
CALCIUM: 8.5 mg/dL — AB (ref 8.9–10.3)
CHLORIDE: 94 mmol/L — AB (ref 98–111)
CO2: 24 mmol/L (ref 22–32)
Creatinine, Ser: 0.71 mg/dL (ref 0.44–1.00)
Glucose, Bld: 158 mg/dL — ABNORMAL HIGH (ref 70–99)
Potassium: 5.4 mmol/L — ABNORMAL HIGH (ref 3.5–5.1)
Sodium: 127 mmol/L — ABNORMAL LOW (ref 135–145)
TOTAL PROTEIN: 5.8 g/dL — AB (ref 6.5–8.1)

## 2018-06-04 LAB — TROPONIN I
Troponin I: <0.03 ng/mL (ref <0.03)
Troponin I: 0.09 ng/mL (ref <0.03)

## 2018-06-04 MED ORDER — SODIUM CHLORIDE 0.9 % IV SOLN
INTRAVENOUS | Status: DC
  Administered 2018-06-04: 22:00:00 via INTRAVENOUS

## 2018-06-04 MED ORDER — TRAZODONE HCL 50 MG PO TABS
25.0000 mg | ORAL_TABLET | Freq: Every evening | ORAL | Status: DC | PRN
  Administered 2018-06-04 – 2018-06-06 (×2): 25 mg via ORAL
  Filled 2018-06-04 (×2): qty 1

## 2018-06-04 MED ORDER — CARVEDILOL 25 MG PO TABS
25.0000 mg | ORAL_TABLET | Freq: Two times a day (BID) | ORAL | Status: DC
  Administered 2018-06-05 – 2018-06-09 (×9): 25 mg via ORAL
  Filled 2018-06-04 (×9): qty 1

## 2018-06-04 MED ORDER — HYDROCODONE-ACETAMINOPHEN 5-325 MG PO TABS
1.0000 | ORAL_TABLET | ORAL | Status: DC | PRN
  Administered 2018-06-07: 1 via ORAL
  Filled 2018-06-04: qty 1

## 2018-06-04 MED ORDER — RAMIPRIL 10 MG PO CAPS
20.0000 mg | ORAL_CAPSULE | ORAL | Status: DC
  Administered 2018-06-05 – 2018-06-09 (×5): 20 mg via ORAL
  Filled 2018-06-04 (×5): qty 2

## 2018-06-04 MED ORDER — TIOTROPIUM BROMIDE MONOHYDRATE 18 MCG IN CAPS
18.0000 ug | ORAL_CAPSULE | Freq: Every day | RESPIRATORY_TRACT | Status: DC
  Administered 2018-06-05 – 2018-06-09 (×5): 18 ug via RESPIRATORY_TRACT
  Filled 2018-06-04: qty 5

## 2018-06-04 MED ORDER — FLUTICASONE FUROATE-VILANTEROL 100-25 MCG/INH IN AEPB
1.0000 | INHALATION_SPRAY | Freq: Every day | RESPIRATORY_TRACT | Status: DC
  Administered 2018-06-05 – 2018-06-09 (×5): 1 via RESPIRATORY_TRACT
  Filled 2018-06-04: qty 28

## 2018-06-04 MED ORDER — ONDANSETRON HCL 4 MG/2ML IJ SOLN
4.0000 mg | Freq: Four times a day (QID) | INTRAMUSCULAR | Status: DC | PRN
  Filled 2018-06-04: qty 2

## 2018-06-04 MED ORDER — HEPARIN SODIUM (PORCINE) 5000 UNIT/ML IJ SOLN
5000.0000 [IU] | Freq: Three times a day (TID) | INTRAMUSCULAR | Status: DC
  Administered 2018-06-04 – 2018-06-09 (×12): 5000 [IU] via SUBCUTANEOUS
  Filled 2018-06-04 (×13): qty 1

## 2018-06-04 MED ORDER — CLONIDINE HCL 0.1 MG PO TABS
0.1000 mg | ORAL_TABLET | Freq: Two times a day (BID) | ORAL | Status: DC
  Filled 2018-06-04: qty 1

## 2018-06-04 MED ORDER — VITAMIN C 500 MG PO TABS
500.0000 mg | ORAL_TABLET | Freq: Every day | ORAL | Status: DC
  Administered 2018-06-05 – 2018-06-09 (×5): 500 mg via ORAL
  Filled 2018-06-04 (×5): qty 1

## 2018-06-04 MED ORDER — RALOXIFENE HCL 60 MG PO TABS
60.0000 mg | ORAL_TABLET | Freq: Every day | ORAL | Status: DC
  Administered 2018-06-05 – 2018-06-09 (×5): 60 mg via ORAL
  Filled 2018-06-04 (×5): qty 1

## 2018-06-04 MED ORDER — ONDANSETRON 4 MG PO TBDP: 4.0000 mg | ORAL_TABLET | Freq: Three times a day (TID) | ORAL | Status: DC | PRN

## 2018-06-04 MED ORDER — CLONIDINE HCL 0.1 MG PO TABS
0.1000 mg | ORAL_TABLET | Freq: Two times a day (BID) | ORAL | Status: DC
  Administered 2018-06-04 – 2018-06-05 (×2): 0.1 mg via ORAL
  Filled 2018-06-04 (×2): qty 1

## 2018-06-04 MED ORDER — ASPIRIN EC 81 MG PO TBEC
81.0000 mg | DELAYED_RELEASE_TABLET | Freq: Every day | ORAL | Status: DC
  Administered 2018-06-05 – 2018-06-08 (×4): 81 mg via ORAL
  Filled 2018-06-04 (×5): qty 1

## 2018-06-04 MED ORDER — ACETAMINOPHEN 325 MG PO TABS
650.0000 mg | ORAL_TABLET | Freq: Four times a day (QID) | ORAL | Status: DC | PRN
  Administered 2018-06-06 – 2018-06-07 (×2): 650 mg via ORAL
  Filled 2018-06-04 (×2): qty 2

## 2018-06-04 MED ORDER — TEMAZEPAM 7.5 MG PO CAPS: 7.5000 mg | ORAL_CAPSULE | Freq: Every evening | ORAL | Status: DC | PRN

## 2018-06-04 MED ORDER — MOMETASONE FURO-FORMOTEROL FUM 200-5 MCG/ACT IN AERO: 2.0000 | INHALATION_SPRAY | Freq: Two times a day (BID) | RESPIRATORY_TRACT | Status: DC

## 2018-06-04 MED ORDER — ALBUTEROL SULFATE (2.5 MG/3ML) 0.083% IN NEBU
3.0000 mL | INHALATION_SOLUTION | Freq: Four times a day (QID) | RESPIRATORY_TRACT | Status: DC | PRN
  Filled 2018-06-04: qty 3

## 2018-06-04 MED ORDER — ONDANSETRON HCL 4 MG PO TABS: 4.0000 mg | ORAL_TABLET | Freq: Four times a day (QID) | ORAL | Status: DC | PRN

## 2018-06-04 MED ORDER — DOCUSATE SODIUM 100 MG PO CAPS
100.0000 mg | ORAL_CAPSULE | Freq: Two times a day (BID) | ORAL | Status: DC
  Administered 2018-06-04 – 2018-06-08 (×9): 100 mg via ORAL
  Filled 2018-06-04 (×10): qty 1

## 2018-06-04 MED ORDER — CALCIUM CARBONATE-VITAMIN D 500-200 MG-UNIT PO TABS
ORAL_TABLET | Freq: Every day | ORAL | Status: DC
  Administered 2018-06-05 – 2018-06-06 (×2): 1 via ORAL
  Administered 2018-06-07: 09:00:00 via ORAL
  Administered 2018-06-08 – 2018-06-09 (×2): 1 via ORAL
  Filled 2018-06-04 (×5): qty 1

## 2018-06-04 MED ORDER — BISACODYL 5 MG PO TBEC
5.0000 mg | DELAYED_RELEASE_TABLET | Freq: Every day | ORAL | Status: DC | PRN
  Administered 2018-06-06 – 2018-06-08 (×4): 5 mg via ORAL
  Filled 2018-06-04 (×4): qty 1

## 2018-06-04 MED ORDER — SODIUM CHLORIDE 0.9 % IV BOLUS
1000.0000 mL | Freq: Once | INTRAVENOUS | Status: AC
  Administered 2018-06-04: 1000 mL via INTRAVENOUS

## 2018-06-04 MED ORDER — ACETAMINOPHEN 650 MG RE SUPP: 650.0000 mg | Freq: Four times a day (QID) | RECTAL | Status: DC | PRN

## 2018-06-04 MED ORDER — PANTOPRAZOLE SODIUM 40 MG PO TBEC
40.0000 mg | DELAYED_RELEASE_TABLET | Freq: Every day | ORAL | Status: DC
  Administered 2018-06-05 – 2018-06-09 (×5): 40 mg via ORAL
  Filled 2018-06-04 (×6): qty 1

## 2018-06-04 NOTE — ED Notes (Signed)
Cup of water provided to pt. 

## 2018-06-04 NOTE — ED Provider Notes (Signed)
[facility] Emergency Department Provider Note ____________________________________________   First MD Initiated Contact with Patient 06/04/18 1439     (approximate)  I have reviewed the triage vital signs and the nursing notes.   HISTORY  Chief Complaint unresponsive  Level 5 caveat: History of present illness limited due to altered mental status.  HPI Melanie Huffman is a 84 y.o. female with PMH as below who presents with an episode of unresponsiveness.  Per the daughter and son-in-law, the patient was recently treated for an orthopedic injury.  They state that over the last few weeks she has been increasingly weak, and has had several falls over the last several days.  They were taking her to her orthopedist this afternoon, when she suddenly became unresponsive in the car.  They felt like she may have turned somewhat blue.  On arrival to the ED, the patient was unresponsive and was unable to give any further history.  Past Medical History:  Diagnosis Date  . Arthritis 2019   mostly in neck  . Asthma   . Breast cancer ([place]) 1991   mastectomy left  . Cancer ([place]) 1991   L Breast  . CHF (congestive heart failure) ([place])   . COPD (chronic obstructive pulmonary disease) ([place])    hypoxia with resp failure admission in 03/2018  . Dementia    confusion beginning  . Emphysema of lung ([place])   . GERD (gastroesophageal reflux disease)   . Heart murmur   . History of hiatal hernia   . Hypertension 03/2018   recent hypertension urgency ..admitted in april 2019  . Hypoxia 03/2018   O2 saturation runs 86-91% on Room Air.  . Intermittent dysphagia   . Peripheral vascular disease ([place])   . Shortness of breath dyspnea   . Traumatic subdural hematoma [facility])     Patient Active Problem List   Diagnosis Date Noted  . Accelerated hypertension 03/07/2018  . Acute systolic CHF (congestive heart failure) ([place]) 03/07/2018  . Swelling of limb 09/03/2017  . Mass of  upper lobe of left lung 11/09/2016  . Uncontrolled hypertension 10/31/2016  . PVD (peripheral vascular disease) ([place]) 10/31/2016  . COPD (chronic obstructive pulmonary disease) ([place]) 10/31/2016  . GERD (gastroesophageal reflux disease) 10/31/2016  . Hyponatremia 07/10/2016  . Dehydration 07/10/2016  . Acute renal insufficiency 07/10/2016  . Hypokalemia 07/10/2016  . Cellulitis of leg, left 07/09/2016  . Patellar fracture 04/11/2015  . Subdural hematoma ([place]) 07/26/2014    Past Surgical History:  Procedure Laterality Date  . ABDOMINAL HYSTERECTOMY     Partial  . APPENDECTOMY    . BREAST BIOPSY Right    1980 and 1970's  . BREAST SURGERY  1991   L Mastectomy  . EYE SURGERY Bilateral    cataract extraction  . MASTECTOMY    . ORIF PATELLA Left 04/12/2015   Procedure: OPEN REDUCTION INTERNAL (ORIF) FIXATION PATELLA;  Surgeon: Hessie Knows, MD;  Location: [facility] ORS;  Service: Orthopedics;  Laterality: Left;  . ORIF WRIST FRACTURE Right 05/04/2018   Procedure: OPEN REDUCTION INTERNAL FIXATION (ORIF) WRIST FRACTURE;  Surgeon: Hessie Knows, MD;  Location: [facility] ORS;  Service: Orthopedics;  Laterality: Right;  . PERIPHERAL VASCULAR CATHETERIZATION Left 04/17/2015   Procedure: Lower Extremity Angiography;  Surgeon: Katha Cabal, MD;  Location: [place] CV LAB;  Service: Cardiovascular;  Laterality: Left;  . PERIPHERAL VASCULAR CATHETERIZATION  04/17/2015   Procedure: Lower Extremity Intervention;  Surgeon: Katha Cabal, MD;  Location: [place]  CV LAB;  Service: Cardiovascular;;  . TONSILLECTOMY      Prior to Admission medications   Medication Sig Start Date End Date Taking? Authorizing Provider  acetaminophen (TYLENOL) 325 MG tablet Take 2 tablets (650 mg total) by mouth every 6 (six) hours as needed for mild pain (or Fever >/= 101). 04/16/15   Reche Dixon, PA-C  albuterol (PROVENTIL HFA;VENTOLIN HFA) 108 (90 Base) MCG/ACT inhaler Inhale 2 puffs into the lungs every 6 (six)  hours as needed for wheezing or shortness of breath.    [provider]  Ascorbic Acid (VITAMIN C PO) Take 1 capsule by mouth daily.    [provider]  aspirin EC 81 MG tablet Take 1 tablet (81 mg total) by mouth daily. 03/09/18   Saundra Shelling, MD  Calcium Carbonate-Vitamin D (CALCIUM + D PO) Take 1 tablet by mouth 2 (two) times daily.    [provider]  carvedilol (COREG) 25 MG tablet Take 25 mg by mouth 2 (two) times daily with a meal.    [provider]  cloNIDine (CATAPRES) 0.1 MG tablet Take 1 tablet (0.1 mg total) by mouth 2 (two) times daily. 11/04/16 05/04/18  Fritzi Mandes, MD  Fluticasone-Salmeterol (ADVAIR DISKUS) 250-50 MCG/DOSE AEPB Inhale 1 puff into the lungs 2 (two) times daily. 06/12/16   [provider]  furosemide (LASIX) 20 MG tablet Take 1 tablet (20 mg total) by mouth daily. 03/09/18 03/09/19  Saundra Shelling, MD  Multiple Vitamins-Minerals (CENTRUM SILVER PO) Take 1 tablet by mouth daily.    [provider]  omeprazole (PRILOSEC) 20 MG capsule Take 40 mg by mouth daily.     [provider]  ondansetron (ZOFRAN ODT) 4 MG disintegrating tablet Take 1 tablet (4 mg total) by mouth every 8 (eight) hours as needed for nausea or vomiting. 05/02/18   Earleen Newport, MD  oxyCODONE-acetaminophen (PERCOCET) 5-325 MG tablet Take 1-2 tablets by mouth every 8 (eight) hours as needed. 05/02/18   Earleen Newport, MD  potassium chloride SA (K-DUR,KLOR-CON) 20 MEQ tablet Take 1 tablet (20 mEq total) by mouth daily. 03/09/18   Saundra Shelling, MD  raloxifene (EVISTA) 60 MG tablet Take 60 mg by mouth daily.    [provider]  ramipril (ALTACE) 10 MG capsule Take 20 mg by mouth every morning.    [provider]  tiotropium (SPIRIVA) 18 MCG inhalation capsule Place 18 mcg into inhaler and inhale daily.     [provider]    Allergies Amlodipine; Adhesive [tape]; Codeine; Percocet [oxycodone-acetaminophen]; and  Sulfa antibiotics  Family History  Problem Relation Age of Onset  . CVA Mother   . Kidney disease Mother   . Heart disease Father   . Kidney disease Cousin 51  . Kidney disease Cousin 31  . Breast cancer Neg Hx     Social History Social History   Tobacco Use  . Smoking status: Former Smoker    Packs/day: 0.50    Years: 20.00    Pack years: 10.00    Types: Cigarettes    Last attempt to quit: 01/26/1979    Years since quitting: 39.3  . Smokeless tobacco: Never Used  Substance Use Topics  . Alcohol use: No  . Drug use: No    Review of Systems Level 5 caveat: Unable to obtain review of systems due to altered mental status    ____________________________________________   PHYSICAL EXAM:  VITAL SIGNS: ED Triage Vitals  Enc Vitals Group  BP 06/04/18 1428 97/60     Pulse Rate 06/04/18 1428 93     Resp 06/04/18 1428 (!) 22     Temp --      Temp src --      SpO2 06/04/18 1428 99 %     Weight 06/04/18 1429 120 lb (54.4 kg)     Height --      Head Circumference --      Peak Flow --      Pain Score --      Pain Loc --      Pain Edu? --      Excl. in [place]? --     Constitutional: Unresponsive. Eyes: Conjunctivae are normal.  EOMI.  PERRLA. Head: Atraumatic. Nose: No congestion/rhinnorhea. Mouth/Throat: Mucous membranes are somewhat dry.   Neck: Normal range of motion.  No midline spinal tenderness. Cardiovascular: Normal rate, regular rhythm. Grossly normal heart sounds.   Respiratory: Agonal respirations.  Hypoxic. Gastrointestinal: Soft and nontender. No distention.  Genitourinary: No flank tenderness. Musculoskeletal: No lower extremity edema.  Extremities warm and well perfused.  Neurologic: Unresponsive; unable to assess. Skin:  Skin is warm and dry. No rash noted. Psychiatric: Unable to assess.  ____________________________________________   LABS (all labs ordered are listed, but only abnormal results are displayed)  Labs Reviewed  COMPREHENSIVE  METABOLIC PANEL - Abnormal; Notable for the following components:      Result Value   Sodium 127 (*)    Potassium 5.4 (*)    Chloride 94 (*)    Glucose, Bld 158 (*)    Calcium 8.5 (*)    Total Protein 5.8 (*)    Albumin 3.2 (*)    All other components within normal limits  TROPONIN I  CBC WITH DIFFERENTIAL/PLATELET  URINALYSIS, COMPLETE (UACMP) WITH MICROSCOPIC   ____________________________________________  EKG  ED ECG REPORT I, Arta Silence, the attending physician, personally viewed and interpreted this ECG.  Date: 06/04/2018 EKG Time: 1430 Rate: 91 Rhythm: Sinus rhythm with PACs QRS Axis: normal Intervals: normal ST/T Wave abnormalities: normal Narrative Interpretation: no evidence of acute ischemia  ____________________________________________  RADIOLOGY  CT head: Pending CT C-spine: Pending CXR: Pending  ____________________________________________   PROCEDURES  Procedure(s) performed: No  Procedures  Critical Care performed: Yes  CRITICAL CARE Performed by: Arta Silence   Total critical care time: 40 minutes  Critical care time was exclusive of separately billable procedures and treating other patients.  Critical care was necessary to treat or prevent imminent or life-threatening deterioration.  Critical care was time spent personally by me on the following activities: development of treatment plan with patient and/or surrogate as well as nursing, discussions with consultants, evaluation of patient's response to treatment, examination of patient, obtaining history from patient or surrogate, ordering and performing treatments and interventions, ordering and review of laboratory studies, ordering and review of radiographic studies, pulse oximetry and re-evaluation of patient's condition. ____________________________________________   INITIAL IMPRESSION / ASSESSMENT AND PLAN / ED COURSE  Pertinent labs & imaging results that were  available during my care of the patient were reviewed by me and considered in my medical decision making (see chart for details).  84 year old female with PMH as noted above presents with acute onset of altered mental status and unresponsiveness.  The family members report some recent increased weakness and several falls over the last few days, with acute loss of consciousness and unresponsiveness this afternoon while taking her to her orthopedist.  I reviewed the past medical records in  [place]; the patient was most recently seen last month for what was determined to be a mechanical fall.  She was admitted in April of this year for a CHF exacerbation and hypertension.  The patient was brought in by private vehicle.  On ED arrival, she was completely unresponsive and with agonal respirations.  The patient was placed on the monitor and defibrillator pads.  Her O2 saturation was initially in the 60s with poor respiratory effort.  The remainder of the exam is as described above.  We began BVM ventilation, and made preparations to intubate the patient, however when the daughter and son-in-law arrived they confirmed that the patient was DNR/DNI.  Over the course the next few minutes, the patient became alert and was breathing spontaneously with an O2 saturation in the high 90s.  She returned to her baseline mental status over approximately the next 15 minutes.  Overall presentation is most consistent with a syncopal event, however the patient's hypoxia, deep level of unresponsiveness, and her recent increased weakness are concerning for possible precipitating cause such as ICH, ACS, or metabolic derangement.  We will obtain CT head, infection/sepsis work-up, and evaluate for electrolyte abnormality, other metabolic cause, as well as cardiac etiology.  Plan for likely admission when work-up is complete.  ----------------------------------------- 4:06 PM on  06/04/2018 -----------------------------------------  Labs reveal hyponatremia, so I ordered fluids.  Most of the patient's other work-up is still pending.  I discussed the results so far with the patient and her family members.  They agree with the plan to admit, and the patient also will likely need physical therapy and social work evaluations because of her increased weakness and decreased ability to take care of herself at home.  I signed the patient out to the oncoming physician Dr. Jimmye Norman. ____________________________________________   FINAL CLINICAL IMPRESSION(S) / ED DIAGNOSES  Final diagnoses:  Loss of consciousness ([place])  Hypoxia  Hyponatremia      NEW MEDICATIONS STARTED DURING THIS VISIT:  New Prescriptions   No medications on file     Note:  This document was prepared using Dragon voice recognition software and may include unintentional dictation errors.    Arta Silence, MD 06/04/18 [phone]

## 2018-06-04 NOTE — ED Notes (Signed)
This RN EDP Joni Fears and Vicente Males RN witnessed family stating that pt is a DNR, un sure at [facility] but request that the pt not to be intubated at this time. This RN told Siadecki MD family's request.

## 2018-06-04 NOTE — ED Notes (Signed)
Hospitalist to beside.

## 2018-06-04 NOTE — Progress Notes (Signed)
The patient seeks comfort and meaning, during her recent falls and injuries. 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 Her faith is a source of strength and solidity. The patient recognizes that her mobility and activities are limited but believes God can restore her to health. She displayed flashes of memory and insight, then she nodded off several times. Chaplain listened intently offering emotional support. The encounter was closed with prayers for the patient's health and a petition for [employer] and grace.

## 2018-06-04 NOTE — ED Notes (Signed)
Pt alert and talking. Pt not able to remember what happened before she arrived to hospital.

## 2018-06-04 NOTE — ED Notes (Signed)
Patient transported to CT 

## 2018-06-04 NOTE — H&P (Signed)
[facility] NAME: Melanie Huffman    MR#:  [id number]  DATE OF BIRTH:  [birth year]  DATE OF ADMISSION:  06/04/2018  PRIMARY CARE PHYSICIAN: Dion Body, MD   REQUESTING/REFERRING PHYSICIAN: Arta Silence, MD  CHIEF COMPLAINT:   Chief Complaint  Patient presents with  . unresponsive    HISTORY OF PRESENT ILLNESS:  Melanie Huffman  is a 84 y.o. female with multiple medical problem as below presents with an episode of unresponsiveness.  Per the daughter and son-in-law, the patient was recently treated for an orthopedic injury.  They state that over the last few weeks she has been increasingly weak, and has had several falls over the last several days.  They report at least 3 falls in last 48 hours.  Lives in an independent care setting. They were taking her to her orthopedist this afternoon right wrist as she had a plate in there and has had several falls in the last 2 days so they were concerned that she might have injured her wrist she also has had injury on her knees before and so they wanted to make sure there is no other broken bones. when she suddenly became unresponsive in the car.  They felt like she may have turned somewhat blue & might have lost her.  PAST MEDICAL HISTORY:   Past Medical History:  Diagnosis Date  . Arthritis 2019   mostly in neck  . Asthma   . Breast cancer ([place]) 1991   mastectomy left  . Cancer ([place]) 1991   L Breast  . CHF (congestive heart failure) ([place])   . COPD (chronic obstructive pulmonary disease) ([place])    hypoxia with resp failure admission in 03/2018  . Dementia    confusion beginning  . Emphysema of lung ([place])   . GERD (gastroesophageal reflux disease)   . Heart murmur   . History of hiatal hernia   . Hypertension 03/2018   recent hypertension urgency ..admitted in april 2019  . Hypoxia 03/2018   O2 saturation runs 86-91% on Room Air.  . Intermittent dysphagia   . Peripheral  vascular disease ([place])   . Shortness of breath dyspnea   . Traumatic subdural hematoma ([facility])     PAST SURGICAL HISTORY:   Past Surgical History:  Procedure Laterality Date  . ABDOMINAL HYSTERECTOMY     Partial  . APPENDECTOMY    . BREAST BIOPSY Right    1980 and 1970's  . BREAST SURGERY  1991   L Mastectomy  . EYE SURGERY Bilateral    cataract extraction  . MASTECTOMY    . ORIF PATELLA Left 04/12/2015   Procedure: OPEN REDUCTION INTERNAL (ORIF) FIXATION PATELLA;  Surgeon: Hessie Knows, MD;  Location: [facility] ORS;  Service: Orthopedics;  Laterality: Left;  . ORIF WRIST FRACTURE Right 05/04/2018   Procedure: OPEN REDUCTION INTERNAL FIXATION (ORIF) WRIST FRACTURE;  Surgeon: Hessie Knows, MD;  Location: [facility] OR[facility];  Service: Orthopedics;  Laterality: Right;  . PERIPHERAL VASCULAR CATHETERIZATION Left 04/17/2015   Procedure: Lower Extremity Angiography;  Surgeon: Katha Cabal, MD;  Location: [facility];  Service: Cardiovascular;  Laterality: Left;  . PERIPHERAL VASCULAR CATHETERIZATION  04/17/2015   Procedure: Lower Extremity Intervention;  Surgeon: Katha Cabal, MD;  Location: [facility];  Service: Cardiovascular;;  . TONSILLECTOMY      SOCIAL HISTORY:   Social History   Tobacco Use  . Smoking status: Former Smoker  Packs/day: 0.50    Years: 20.00    Pack years: 10.00    Types: Cigarettes    Last attempt to quit: 01/26/1979    Years since quitting: 39.3  . Smokeless tobacco: Never Used  Substance Use Topics  . Alcohol use: No    FAMILY HISTORY:   Family History  Problem Relation Age of Onset  . CVA Mother   . Kidney disease Mother   . Heart disease Father   . Kidney disease Cousin 41  . Kidney disease Cousin 29  . Breast cancer Neg Hx     DRUG ALLERGIES:   Allergies  Allergen Reactions  . Amlodipine Swelling     [Onset: 08/26/2013]feet and neck begin to swell  . Adhesive [Tape] Other (See Comments)    Skin is very thin and tears  easily. Please use paper tape only  . Codeine Nausea Only  . Percocet [Oxycodone-Acetaminophen] Nausea And Vomiting    Patient took on an empty stomach. Good relief but unsettled. Still has not eaten today  . Sulfa Antibiotics Nausea Only and Rash    rash    REVIEW OF SYSTEMS:   Review of Systems  Constitutional: Positive for malaise/fatigue. Negative for chills, fever and weight loss.  HENT: Negative for nosebleeds and sore throat.   Eyes: Negative for blurred vision.  Respiratory: Negative for cough, shortness of breath and wheezing.   Cardiovascular: Negative for chest pain, orthopnea, leg swelling and PND.  Gastrointestinal: Negative for abdominal pain, constipation, diarrhea, heartburn, nausea and vomiting.  Genitourinary: Negative for dysuria and urgency.  Musculoskeletal: Positive for falls and joint pain. Negative for back pain.  Skin: Negative for rash.  Neurological: Positive for dizziness. Negative for speech change, focal weakness and headaches.  Endo/Heme/Allergies: Does not bruise/bleed easily.  Psychiatric/Behavioral: Negative for depression.   MEDICATIONS AT HOME:   Prior to Admission medications   Medication Sig Start Date End Date Taking? Authorizing Provider  acetaminophen (TYLENOL) 325 MG tablet Take 2 tablets (650 mg total) by mouth every 6 (six) hours as needed for mild pain (or Fever >/= 101). 04/16/15   Reche Dixon, PA-C  albuterol (PROVENTIL HFA;VENTOLIN HFA) 108 (90 Base) MCG/ACT inhaler Inhale 2 puffs into the lungs every 6 (six) hours as needed for wheezing or shortness of breath.    [provider]  Ascorbic Acid (VITAMIN C PO) Take 1 capsule by mouth daily.    [provider]  aspirin EC 81 MG tablet Take 1 tablet (81 mg total) by mouth daily. 03/09/18   Saundra Shelling, MD  Calcium Carbonate-Vitamin D (CALCIUM + D PO) Take 1 tablet by mouth 2 (two) times daily.    [provider]  carvedilol (COREG) 25 MG tablet Take 25 mg by  mouth 2 (two) times daily with a meal.    [provider]  cloNIDine (CATAPRES) 0.1 MG tablet Take 1 tablet (0.1 mg total) by mouth 2 (two) times daily. 11/04/16 05/04/18  Fritzi Mandes, MD  Fluticasone-Salmeterol (ADVAIR DISKUS) 250-50 MCG/DOSE AEPB Inhale 1 puff into the lungs 2 (two) times daily. 06/12/16   [provider]  furosemide (LASIX) 20 MG tablet Take 1 tablet (20 mg total) by mouth daily. 03/09/18 03/09/19  Saundra Shelling, MD  Multiple Vitamins-Minerals (CENTRUM SILVER PO) Take 1 tablet by mouth daily.    [provider]  omeprazole (PRILOSEC) 20 MG capsule Take 40 mg by mouth daily.     [provider]  ondansetron (ZOFRAN ODT) 4 MG disintegrating tablet Take  1 tablet (4 mg total) by mouth every 8 (eight) hours as needed for nausea or vomiting. 05/02/18   Earleen Newport, MD  oxyCODONE-acetaminophen (PERCOCET) 5-325 MG tablet Take 1-2 tablets by mouth every 8 (eight) hours as needed. 05/02/18   Earleen Newport, MD  potassium chloride SA (K-DUR,KLOR-CON) 20 MEQ tablet Take 1 tablet (20 mEq total) by mouth daily. 03/09/18   Saundra Shelling, MD  raloxifene (EVISTA) 60 MG tablet Take 60 mg by mouth daily.    [provider]  ramipril (ALTACE) 10 MG capsule Take 20 mg by mouth every morning.    [provider]  tiotropium (SPIRIVA) 18 MCG inhalation capsule Place 18 mcg into inhaler and inhale daily.     [provider]      VITAL SIGNS:  Blood pressure 132/64, pulse 62, resp. rate 15, weight 54.4 kg (120 lb), SpO2 95 %.  PHYSICAL EXAMINATION:  Physical Exam  GENERAL:  84 y.o.-year-old patient lying in the bed with no acute distress.  EYES: Pupils equal, round, reactive to light and accommodation. No scleral icterus. Extraocular muscles intact.  HEENT: Head atraumatic, normocephalic. Oropharynx and nasopharynx clear.  NECK:  Supple, no jugular venous distention. No thyroid enlargement, no tenderness.  LUNGS: Normal breath  sounds bilaterally, no wheezing, rales,rhonchi or crepitation. No use of accessory muscles of respiration.  CARDIOVASCULAR: S1, S2 normal. No murmurs, rubs, or gallops.  ABDOMEN: Soft, nontender, nondistended. Bowel sounds present. No organomegaly or mass.  EXTREMITIES: No pedal edema, cyanosis, or clubbing.  Wrist splint in place on her right hand, both knees don't have any tenderness NEUROLOGIC: Cranial nerves II through XII are intact. Muscle strength 3-4/5 in all extremities. Sensation intact. Gait not checked.  PSYCHIATRIC: The patient is alert and oriented x 3.  SKIN: No obvious rash, lesion, or ulcer.  LABORATORY PANEL:   CBC Recent Labs  Lab 06/04/18 1430  WBC 4.7  HGB 13.3  HCT 39.1  PLT 176   ------------------------------------------------------------------------------------------------------------------  Chemistries  Recent Labs  Lab 06/04/18 1430  NA 127*  K 5.4*  CL 94*  CO2 24  GLUCOSE 158*  BUN 12  CREATININE 0.71  CALCIUM 8.5*  AST 36  ALT 39  ALKPHOS 97  BILITOT 0.8   ------------------------------------------------------------------------------------------------------------------  Cardiac Enzymes Recent Labs  Lab 06/04/18 1430  TROPONINI <0.03   ------------------------------------------------------------------------------------------------------------------  RADIOLOGY:  Ct Head Wo Contrast  Result Date: 06/04/2018 CLINICAL DATA:  [place] today, head and neck pain, denies loss of consciousness, history hypertension, breast cancer, emphysema, former smoker EXAM: CT HEAD WITHOUT CONTRAST CT CERVICAL SPINE WITHOUT CONTRAST TECHNIQUE: Multidetector CT imaging of the head and cervical spine was performed following the standard protocol without intravenous contrast. Multiplanar CT image reconstructions of the cervical spine were also generated. COMPARISON:  03/07/2018 CT head, CT cervical spine 07/25/2014 FINDINGS: CT HEAD FINDINGS Brain: Generalized  atrophy. Normal ventricular morphology. No midline shift or mass effect. Otherwise normal appearance of brain parenchyma. No intracranial hemorrhage, evidence acute infarction, or extra-axial fluid collection. Calcified LEFT parafalcine mass near the vertex 10 mm greatest diameter likely represents a parafalcine meningioma. No additional mass lesions. Vascular: Unremarkable Skull: Demineralized but intact. Small RIGHT supraorbital scalp hematoma. Sinuses/Orbits: Clear Other: N/A CT CERVICAL SPINE FINDINGS Alignment: Normal Skull base and vertebrae: Osseous demineralization. Visualized skull base intact. Vertebral body and disc space heights maintained. No fracture, subluxation or bone destruction. Multilevel facet degenerative changes. Soft tissues and spinal canal: Prevertebral soft tissues normal thickness. Disc levels:  No significant abnormalities  Upper chest: BILATERAL pleural effusions. Compressive atelectasis of the posterior upper lobes. Biapical scarring. Other: N/A IMPRESSION: Generalized atrophy. No acute intracranial abnormalities. 10 mm calcified LEFT parafalcine meningioma. Degenerative facet disease changes cervical spine. No acute cervical spine abnormalities. Electronically Signed   By: Lavonia Dana M.D.   On: 06/04/2018 15:49   Ct Cervical Spine Wo Contrast  Result Date: 06/04/2018 CLINICAL DATA:  [place] today, head and neck pain, denies loss of consciousness, history hypertension, breast cancer, emphysema, former smoker EXAM: CT HEAD WITHOUT CONTRAST CT CERVICAL SPINE WITHOUT CONTRAST TECHNIQUE: Multidetector CT imaging of the head and cervical spine was performed following the standard protocol without intravenous contrast. Multiplanar CT image reconstructions of the cervical spine were also generated. COMPARISON:  03/07/2018 CT head, CT cervical spine 07/25/2014 FINDINGS: CT HEAD FINDINGS Brain: Generalized atrophy. Normal ventricular morphology. No midline shift or mass effect. Otherwise normal  appearance of brain parenchyma. No intracranial hemorrhage, evidence acute infarction, or extra-axial fluid collection. Calcified LEFT parafalcine mass near the vertex 10 mm greatest diameter likely represents a parafalcine meningioma. No additional mass lesions. Vascular: Unremarkable Skull: Demineralized but intact. Small RIGHT supraorbital scalp hematoma. Sinuses/Orbits: Clear Other: N/A CT CERVICAL SPINE FINDINGS Alignment: Normal Skull base and vertebrae: Osseous demineralization. Visualized skull base intact. Vertebral body and disc space heights maintained. No fracture, subluxation or bone destruction. Multilevel facet degenerative changes. Soft tissues and spinal canal: Prevertebral soft tissues normal thickness. Disc levels:  No significant abnormalities Upper chest: BILATERAL pleural effusions. Compressive atelectasis of the posterior upper lobes. Biapical scarring. Other: N/A IMPRESSION: Generalized atrophy. No acute intracranial abnormalities. 10 mm calcified LEFT parafalcine meningioma. Degenerative facet disease changes cervical spine. No acute cervical spine abnormalities. Electronically Signed   By: Lavonia Dana M.D.   On: 06/04/2018 15:49   Dg Chest Portable 1 View  Result Date: 06/04/2018 CLINICAL DATA:  Altered mental status. History of left breast carcinoma EXAM: PORTABLE CHEST 1 VIEW COMPARISON:  March 07, 2018 FINDINGS: There is no appreciable edema or consolidation. Heart is mildly enlarged with pulmonary vascularity normal. No adenopathy. There is aortic atherosclerosis. No pneumothorax. No bone lesions. Status post left mastectomy. IMPRESSION: No edema or consolidation. Mild cardiac enlargement. Aortic atherosclerosis. Status post left mastectomy. Aortic Atherosclerosis (ICD10-I70.0). Electronically Signed   By: Lowella Grip III M.D.   On: 06/04/2018 14:57   IMPRESSION AND PLAN:  23 y f with recurrent falls and syncope/presyncope  *Syncope/presyncope -Likely multifactorial  possibly cardiogenic, severe hyponatremia and mild hyperkalemia could be contributing as well -Her heart rate seems to be fluctuating anywhere from mid 60s to 70s, can consider stopping Coreg -We will check TSH hemoglobin A1c and orthostatic vitals -Check carotid Dopplers -Hold off repeating echo as she just had recently done and showed EF of 55-60% -Monitor on telemetry, consult cardiology -Serial troponins  *Recurrent falls - Multi-factorial, consult physical and occupational therapy -She may need higher level of care at discharge  * Hyponatremia -hold Lasix,. hydrate her with IV fluids and monitor sodium looking back at her labs she did have sodium as low as 118 back in December 2017 but her sodium was normalized in April 2019  *Hyperkalemia -Likely iatrogenic -We will stop potassium replacement and recheck potassium.  No EKG changes     All the records are reviewed and case discussed with ED provider. Management plans discussed with the patient, family (daughter and son-in-law at bedside) and they are in agreement.  CODE STATUS: DNR  TOTAL TIME TAKING CARE OF THIS PATIENT: 78  minutes.    Max Sane M.D on 06/04/2018 at 7:01 PM  Between 7am to 6pm - Pager - [phone]  After 6pm go to [email or web address] - Proofreader  [facility]  Office  [phone]  CC: Primary care physician; Dion Body, MD   Note: This dictation was prepared with Dragon dictation along with smaller phrase technology. Any transcriptional errors that result from this process are unintentional.

## 2018-06-04 NOTE — ED Triage Notes (Addendum)
Pt via pov; arrived unresponsive, breathing with  pulse.

## 2018-06-05 ENCOUNTER — Observation Stay: Payer: PPO

## 2018-06-05 ENCOUNTER — Observation Stay
Admit: 2018-06-05 | Discharge: 2018-06-05 | Disposition: A | Discharge status: Home / Self Care | Payer: PPO | Attending: Cardiovascular Disease | Admitting: Cardiovascular Disease

## 2018-06-05 DIAGNOSIS — R296 Repeated falls: Secondary | ICD-10-CM | POA: Diagnosis not present

## 2018-06-05 DIAGNOSIS — E875 Hyperkalemia: Secondary | ICD-10-CM | POA: Diagnosis not present

## 2018-06-05 DIAGNOSIS — I6522 Occlusion and stenosis of left carotid artery: Secondary | ICD-10-CM | POA: Diagnosis not present

## 2018-06-05 DIAGNOSIS — R55 Syncope and collapse: Secondary | ICD-10-CM | POA: Diagnosis not present

## 2018-06-05 DIAGNOSIS — E871 Hypo-osmolality and hyponatremia: Secondary | ICD-10-CM | POA: Diagnosis not present

## 2018-06-05 LAB — ECHOCARDIOGRAM COMPLETE
HEIGHTINCHES: 65 in
WEIGHTICAEL: 2176 [oz_av]

## 2018-06-05 LAB — CBC
HEMATOCRIT: 34.1 % — AB (ref 35.0–47.0)
Hemoglobin: 11.2 g/dL — ABNORMAL LOW (ref 12.0–16.0)
MCH: 31.3 pg (ref 26.0–34.0)
MCHC: 32.7 g/dL (ref 32.0–36.0)
MCV: 95.9 fL (ref 80.0–100.0)
Platelets: 142 10*3/uL — ABNORMAL LOW (ref 150–440)
RBC: 3.56 MIL/uL — AB (ref 3.80–5.20)
RDW: 15.1 % — ABNORMAL HIGH (ref 11.5–14.5)
WBC: 3.2 10*3/uL — ABNORMAL LOW (ref 3.6–11.0)

## 2018-06-05 LAB — MRSA PCR SCREENING: MRSA by PCR: NEGATIVE

## 2018-06-05 LAB — BASIC METABOLIC PANEL
Anion gap: 4 — ABNORMAL LOW (ref 5–15)
BUN: 12 mg/dL (ref 8–23)
CALCIUM: 8.2 mg/dL — AB (ref 8.9–10.3)
CHLORIDE: 96 mmol/L — AB (ref 98–111)
CO2: 28 mmol/L (ref 22–32)
CREATININE: 0.57 mg/dL (ref 0.44–1.00)
GFR calc Af Amer: >60 mL/min (ref >60)
GFR calc non Af Amer: >60 mL/min (ref >60)
GLUCOSE: 118 mg/dL — AB (ref 70–99)
Potassium: 4.7 mmol/L (ref 3.5–5.1)
Sodium: 128 mmol/L — ABNORMAL LOW (ref 135–145)

## 2018-06-05 LAB — TROPONIN I
TROPONIN I: 0.03 ng/mL — AB (ref <0.03)
Troponin I: 0.04 ng/mL (ref <0.03)

## 2018-06-05 LAB — GLUCOSE, CAPILLARY: Glucose-Capillary: 95 mg/dL (ref 70–99)

## 2018-06-05 LAB — HEMOGLOBIN A1C
Hgb A1c MFr Bld: 5.3 % (ref 4.8–5.6)
MEAN PLASMA GLUCOSE: 105.41 mg/dL

## 2018-06-05 MED ORDER — CLONIDINE HCL 0.1 MG PO TABS: 0.1000 mg | ORAL_TABLET | Freq: Every day | ORAL | Status: DC

## 2018-06-05 NOTE — Evaluation (Signed)
Physical Therapy Evaluation Patient Details Name: Melanie Huffman MRN: [id number] DOB: 10-03-1928 Today's Date: 06/05/2018   History of Present Illness  Patient is an 84 y/o female that presents with syncopal episode and multiple recent falls. Noted to be severely hyponatremic and slightly hyperkalemic.   Clinical Impression  Patient evaluated after recent history of falls, noted to have old R wrist fracture and L knee pain. She was initially noted to have low O2 sats on room air (upper 70s) when discussed with RN, she had recently been weaned. Once on 3L of O2 while ambulating patient was able to maintain sats >85%, though became quite fatigued. She does take short, quick steps and encouraged slower paced longer steps during ambulation. She would likely benefit from HHPT at discharge to continue increasing her independence with ambulation.     Follow Up Recommendations Home health PT    Equipment Recommendations  Rolling walker with 5" wheels    Recommendations for Other Services       Precautions / Restrictions Precautions Precautions: Fall Restrictions Weight Bearing Restrictions: No      Mobility  Bed Mobility Overal bed mobility: Needs Assistance Bed Mobility: Supine to Sit     Supine to sit: Min guard;Min assist     General bed mobility comments: Minimal assistance required for trunkal stability.   Transfers Overall transfer level: Needs assistance Equipment used: 1 person hand held assist Transfers: Sit to/from Stand Sit to Stand: Min guard;Min assist         General transfer comment: Minimal force required to assist with transfer.   Ambulation/Gait Ambulation/Gait assistance: Min guard Gait Distance (Feet): 200 Feet Assistive device: IV Pole Gait Pattern/deviations: WFL(Within Functional Limits)   Gait velocity interpretation: <1.31 ft/sec, indicative of household ambulator General Gait Details: Patient demonstrated decreased stride length bilaterally,  unable to ambulate safely without HHA.   Stairs            Wheelchair Mobility    Modified Rankin (Stroke Patients Only)       Balance Overall balance assessment: Needs assistance;History of Falls Sitting-balance support: Feet supported Sitting balance-Leahy Scale: Good     Standing balance support: Single extremity supported Standing balance-Leahy Scale: Fair                               Pertinent Vitals/Pain Pain Assessment: Faces Faces Pain Scale: Hurts a little bit Pain Location: R wrist  Pain Descriptors / Indicators: Aching Pain Intervention(s): Limited activity within patient's tolerance    Home Living Family/patient expects to be discharged to:: Assisted living Living Arrangements: Alone             Home Equipment: Kasandra Knudsen - single point      Prior Function Level of Independence: Independent with assistive device(s)         Comments: Per patient she has just recently started using a SPC      Hand Dominance   Dominant Hand: Right    Extremity/Trunk Assessment   Upper Extremity Assessment Upper Extremity Assessment: RUE deficits/detail RUE Deficits / Details: Per patient fractured R wrist about 4 weeks ago.     Lower Extremity Assessment Lower Extremity Assessment: LLE deficits/detail LLE Deficits / Details: Per patient, mild L knee pain with ambulation.        Communication   Communication: HOH  Cognition Arousal/Alertness: Awake/alert Behavior During Therapy: WFL for tasks assessed/performed Overall Cognitive Status: Within Functional Limits for tasks assessed  General Comments      Exercises General Exercises - Lower Extremity Long Arc Quad: AROM;Both;15 reps Straight Leg Raises: AROM;10 reps;Both Other Exercises Other Exercises: Performed sit to stand transfer x 3 for 2 sets with rest provided between sets with use of UEs and min A.    Assessment/Plan     PT Assessment Patient needs continued PT services  PT Problem List Decreased mobility;Decreased strength;Cardiopulmonary status limiting activity;Decreased activity tolerance;Decreased balance;Decreased knowledge of use of DME       PT Treatment Interventions Gait training;Therapeutic exercise;Therapeutic activities;DME instruction;Stair training;Balance training;Functional mobility training;Neuromuscular re-education    PT Goals (Current goals can be found in the Care Plan section)  Acute Rehab PT Goals Patient Stated Goal: To return home safely  PT Goal Formulation: With patient Time For Goal Achievement: 06/19/18 Potential to Achieve Goals: Good    Frequency Min 2X/week   Barriers to discharge        Co-evaluation               AM-PAC PT "6 Clicks" Daily Activity  Outcome Measure Difficulty turning over in bed (including adjusting bedclothes, sheets and blankets)?: A Little Difficulty moving from lying on back to sitting on the side of the bed? : A Little Difficulty sitting down on and standing up from a chair with arms (e.g., wheelchair, bedside commode, etc,.)?: A Little Help needed moving to and from a bed to chair (including a wheelchair)?: A Little Help needed walking in hospital room?: A Little Help needed climbing 3-5 steps with a railing? : A Little 6 Click Score: 18    End of Session Equipment Utilized During Treatment: Gait belt Activity Tolerance: Patient tolerated treatment well Patient left: with call bell/phone within reach;in chair;with chair alarm set Nurse Communication: Mobility status PT Visit Diagnosis: Muscle weakness (generalized) (M62.81);Repeated falls (R29.6)    Time: 3559-7416 PT Time Calculation (min) (ACUTE ONLY): 33 min   Charges:   PT Evaluation $PT Eval Moderate Complexity: 1 Mod PT Treatments $Therapeutic Exercise: 8-22 mins   PT G Codes:        Royce Macadamia PT, DPT, CSCS   06/05/2018, 4:42 PM

## 2018-06-05 NOTE — Care Management Obs Status (Signed)
[facility] NOTIFICATION   Patient Details  Name: Melanie Huffman MRN: [id number] Date of Birth: 19-Dec-1927   Medicare Observation Status Notification Given:  Yes    Levis Nazir A Sallyann Kinnaird, RN 06/05/2018, 8:32 AM

## 2018-06-05 NOTE — Care Management Note (Signed)
Case Management Note  Patient Details  Name: MAIANA HENNIGAN MRN: [id number] Date of Birth: 10-26-1928  Subjective/Objective:     Patient admitted to [facility] under observation status for sycope. RNCM consulted on patient to provide MOON letter and complete assessment. Per the patient she lives alone.Closest family member is her daughter Bourque [phone] lives in [place]. She has had some falls lately and reports a mild increase in weakness. Uses a cane but no other DME. Has a private caregiver who comes for multiple hours a day and a family friend who checks on her in the evenings. Patient utilizes the above mentioned support for transport as she recently stopped driving. PCP is Dr Netty Starring with whom she sees regularly. [place] and obtains medications without concern.                 Action/Plan: RNCM to continue to follow for any needs. Patient prefers I speak with her daughter who is from out of town but on the way here. PT pending  Expected Discharge Date:                  Expected Discharge Plan:     In-House Referral:     Discharge planning Services     Post Acute Care Choice:    Choice offered to:     DME Arranged:    DME Agency:     HH Arranged:    HH Agency:     Status of Service:     If discussed at [employer], dates discussed:    Additional Comments:  Latanya Maudlin, RN 06/05/2018, 8:34 AM

## 2018-06-05 NOTE — Consult Note (Signed)
Melanie Huffman is a 84 y.o. female  [id number]  Primary Cardiologist: [facility] cardiology/Dr. Ubaldo Glassing Reason for Consultation: Syncope  HPI: This 84 year old white female with history of orthopedic issues with multiple falls in the past presented to the hospital with frank syncope. Patient's daughter witnessed syncopal episode lasting for 15 minutes associated with changes in color with turning blue.   Review of Systems: No chest pain but had some dizziness prior to this event   Past Medical History:  Diagnosis Date  . Arthritis 2019   mostly in neck  . Asthma   . Breast cancer ([place]) 1991   mastectomy left  . Cancer ([place]) 1991   L Breast  . CHF (congestive heart failure) ([place])   . COPD (chronic obstructive pulmonary disease) ([place])    hypoxia with resp failure admission in 03/2018  . Dementia    confusion beginning  . Emphysema of lung ([place])   . GERD (gastroesophageal reflux disease)   . Heart murmur   . History of hiatal hernia   . Hypertension 03/2018   recent hypertension urgency ..admitted in april 2019  . Hypoxia 03/2018   O2 saturation runs 86-91% on Room Air.  . Intermittent dysphagia   . Peripheral vascular disease ([place])   . Shortness of breath dyspnea   . Traumatic subdural hematoma ([facility])     Medications Prior to Admission  Medication Sig Dispense Refill  . albuterol (PROVENTIL HFA;VENTOLIN HFA) 108 (90 Base) MCG/ACT inhaler Inhale 2 puffs into the lungs every 6 (six) hours as needed for wheezing or shortness of breath.    Marland Kitchen aspirin EC 81 MG tablet Take 1 tablet (81 mg total) by mouth daily. 30 tablet 0  . Calcium Carbonate-Vitamin D3 (CALCIUM 600/VITAMIN D) 600-400 MG-UNIT TABS Take 1 tablet by mouth daily.    . carvedilol (COREG) 25 MG tablet Take 25 mg by mouth 2 (two) times daily with a meal.    . cloNIDine (CATAPRES) 0.1 MG tablet Take 0.1 mg by mouth 2 (two) times daily.    . fluticasone furoate-vilanterol (BREO ELLIPTA) 100-25 MCG/INH AEPB Inhale 1 puff into  the lungs daily.    . furosemide (LASIX) 20 MG tablet Take 1 tablet (20 mg total) by mouth daily. 30 tablet 0  . GLUCOSAMINE HCL PO Take 1 tablet by mouth daily.    . Multiple Vitamins-Minerals (CENTRUM SILVER PO) Take 1 tablet by mouth daily.    Marland Kitchen omeprazole (PRILOSEC) 20 MG capsule Take 20 mg by mouth daily.     . potassium chloride SA (K-DUR,KLOR-CON) 20 MEQ tablet Take 1 tablet (20 mEq total) by mouth daily. 30 tablet 0  . Psyllium (METAMUCIL FIBER PO) Take 5 capsules by mouth 3 (three) times daily.    . raloxifene (EVISTA) 60 MG tablet Take 60 mg by mouth daily.    . ramipril (ALTACE) 10 MG capsule Take 20 mg by mouth every morning.    . temazepam (RESTORIL) 7.5 MG capsule Take 7.5 mg by mouth at bedtime as needed for sleep.    Marland Kitchen tiotropium (SPIRIVA) 18 MCG inhalation capsule Place 18 mcg into inhaler and inhale daily.     . vitamin C (ASCORBIC ACID) 500 MG tablet Take 500 mg by mouth daily.    . ondansetron (ZOFRAN ODT) 4 MG disintegrating tablet Take 1 tablet (4 mg total) by mouth every 8 (eight) hours as needed for nausea or vomiting. (Patient not taking: Reported on 06/04/2018) 20 tablet 0  . oxyCODONE-acetaminophen (PERCOCET) 5-325 MG  tablet Take 1-2 tablets by mouth every 8 (eight) hours as needed. (Patient not taking: Reported on 06/04/2018) 20 tablet 0     . aspirin EC  81 mg Oral Daily  . calcium-vitamin D   Oral Daily  . carvedilol  25 mg Oral BID WC  . cloNIDine  0.1 mg Oral BID  . cloNIDine  0.1 mg Oral BID  . docusate sodium  100 mg Oral BID  . fluticasone furoate-vilanterol  1 puff Inhalation Daily  . heparin  5,000 Units Subcutaneous Q8H  . pantoprazole  40 mg Oral Daily  . raloxifene  60 mg Oral Daily  . ramipril  20 mg Oral [id number]  . tiotropium  18 mcg Inhalation Daily  . vitamin C  500 mg Oral Daily    Infusions: . sodium chloride 50 mL/hr at 06/05/18 0926    Allergies  Allergen Reactions  . Amlodipine Swelling     [Onset: 08/26/2013]feet and neck begin to  swell  . Adhesive [Tape] Other (See Comments)    Skin is very thin and tears easily. Please use paper tape only  . Codeine Nausea Only  . Percocet [Oxycodone-Acetaminophen] Nausea And Vomiting    Patient took on an empty stomach. Good relief but unsettled. Still has not eaten today  . Sulfa Antibiotics Nausea Only and Rash    rash    Social History   Socioeconomic History  . Marital status: Widowed    Spouse name: Not on file  . Number of children: Not on file  . Years of education: Not on file  . Highest education level: Not on file  Occupational History  . Not on file  Social Needs  . Financial resource strain: Not on file  . Food insecurity:    Worry: Not on file    Inability: Not on file  . Transportation needs:    Medical: Not on file    Non-medical: Not on file  Tobacco Use  . Smoking status: Former Smoker    Packs/day: 0.50    Years: 20.00    Pack years: 10.00    Types: Cigarettes    Last attempt to quit: 01/26/1979    Years since quitting: 39.3  . Smokeless tobacco: Never Used  Substance and Sexual Activity  . Alcohol use: No  . Drug use: No  . Sexual activity: Not Currently  Lifestyle  . Physical activity:    Days per week: Not on file    Minutes per session: Not on file  . Stress: Not on file  Relationships  . Social connections:    Talks on phone: Not on file    Gets together: Not on file    Attends religious service: Not on file    Active member of club or organization: Not on file    Attends meetings of clubs or organizations: Not on file    Relationship status: Not on file  . Intimate partner violence:    Fear of current or ex partner: Not on file    Emotionally abused: Not on file    Physically abused: Not on file    Forced sexual activity: Not on file  Other Topics Concern  . Not on file  Social History Narrative  . Not on file    Family History  Problem Relation Age of Onset  . CVA Mother   . Kidney disease Mother   . Heart disease  Father   . Kidney disease Cousin 26  . Kidney disease Cousin 52  .  Breast cancer Neg Hx     PHYSICAL EXAM: Vitals:   06/05/18 0926 06/05/18 0929  BP: 126/64 126/64  Pulse:  64  Resp:  20  Temp:    SpO2:  94%     Intake/Output Summary (Last 24 hours) at 06/05/2018 1130 Last data filed at 06/05/2018 1006 Gross per 24 hour  Intake 240 ml  Output 550 ml  Net -310 ml    General:  Well appearing. No respiratory difficulty HEENT: normal Neck: supple. no JVD. Carotids 2+ bilat; no bruits. No lymphadenopathy or thryomegaly appreciated. Cor: PMI nondisplaced. Regular rate & rhythm. No rubs, gallops or murmurs. Lungs: clear Abdomen: soft, nontender, nondistended. No hepatosplenomegaly. No bruits or masses. Good bowel sounds. Extremities: no cyanosis, clubbing, rash, edema Neuro: alert & oriented x 3, cranial nerves grossly intact. moves all 4 extremities w/o difficulty. Affect pleasant.  ECG:  Sinus rhythm with occasional Mobitz 2 second-degree AV block  Results for orders placed or performed during the hospital encounter of 06/04/18 (from the past 24 hour(s))  Troponin I     Status: None   Collection Time: 06/04/18  2:30 PM  Result Value Ref Range   Troponin I <0.03 <0.03 ng/mL  Comprehensive metabolic panel     Status: Abnormal   Collection Time: 06/04/18  2:30 PM  Result Value Ref Range   Sodium 127 (L) 135 - 145 mmol/L   Potassium 5.4 (H) 3.5 - 5.1 mmol/L   Chloride 94 (L) 98 - 111 mmol/L   CO2 24 22 - 32 mmol/L   Glucose, Bld 158 (H) 70 - 99 mg/dL   BUN 12 8 - 23 mg/dL   Creatinine, Ser 0.71 0.44 - 1.00 mg/dL   Calcium 8.5 (L) 8.9 - 10.3 mg/dL   Total Protein 5.8 (L) 6.5 - 8.1 g/dL   Albumin 3.2 (L) 3.5 - 5.0 g/dL   AST 36 15 - 41 U/L   ALT 39 0 - 44 U/L   Alkaline Phosphatase 97 38 - 126 U/L   Total Bilirubin 0.8 0.3 - 1.2 mg/dL   GFR calc non Af Amer >60 >60 mL/min   GFR calc Af Amer >60 >60 mL/min   Anion gap 9 5 - 15  CBC with Differential     Status: None    Collection Time: 06/04/18  2:30 PM  Result Value Ref Range   WBC 4.7 3.6 - 11.0 K/uL   RBC 4.17 3.80 - 5.20 MIL/uL   Hemoglobin 13.3 12.0 - 16.0 g/dL   HCT 39.1 35.0 - 47.0 %   MCV 93.7 80.0 - 100.0 fL   MCH 31.8 26.0 - 34.0 pg   MCHC 33.9 32.0 - 36.0 g/dL   RDW 14.5 11.5 - 14.5 %   Platelets 176 150 - 440 K/uL   Neutrophils Relative % 51 %   Neutro Abs 2.4 1.4 - 6.5 K/uL   Lymphocytes Relative 38 %   Lymphs Abs 1.8 1.0 - 3.6 K/uL   Monocytes Relative 9 %   Monocytes Absolute 0.4 0.2 - 0.9 K/uL   Eosinophils Relative 1 %   Eosinophils Absolute 0.1 0 - 0.7 K/uL   Basophils Relative 1 %   Basophils Absolute 0.0 0 - 0.1 K/uL  Urinalysis, Complete w Microscopic     Status: Abnormal   Collection Time: 06/04/18  4:59 PM  Result Value Ref Range   Color, Urine YELLOW (A) YELLOW   APPearance CLEAR (A) CLEAR   Specific Gravity, Urine 1.016 1.005 - 1.030  pH 5.0 5.0 - 8.0   Glucose, UA NEGATIVE NEGATIVE mg/dL   Hgb urine dipstick NEGATIVE NEGATIVE   Bilirubin Urine NEGATIVE NEGATIVE   Ketones, ur NEGATIVE NEGATIVE mg/dL   Protein, ur 30 (A) NEGATIVE mg/dL   Nitrite NEGATIVE NEGATIVE   Leukocytes, UA NEGATIVE NEGATIVE   RBC / HPF 0-5 0 - 5 RBC/hpf   WBC, UA 0-5 0 - 5 WBC/hpf   Bacteria, UA NONE SEEN NONE SEEN   Squamous Epithelial / LPF 0-5 0 - 5   Mucus PRESENT    Hyaline Casts, UA PRESENT   TSH     Status: None   Collection Time: 06/04/18  8:55 PM  Result Value Ref Range   TSH 3.920 0.350 - 4.500 uIU/mL  Troponin I     Status: Abnormal   Collection Time: 06/04/18  8:55 PM  Result Value Ref Range   Troponin I 0.09 (HH) <0.03 ng/mL  Hemoglobin A1c     Status: None   Collection Time: 06/04/18  8:55 PM  Result Value Ref Range   Hgb A1c MFr Bld 5.3 4.8 - 5.6 %   Mean Plasma Glucose 105.41 mg/dL  MRSA PCR Screening     Status: None   Collection Time: 06/04/18  9:48 PM  Result Value Ref Range   MRSA by PCR NEGATIVE NEGATIVE  Basic metabolic panel     Status: Abnormal    Collection Time: 06/05/18  2:04 AM  Result Value Ref Range   Sodium 128 (L) 135 - 145 mmol/L   Potassium 4.7 3.5 - 5.1 mmol/L   Chloride 96 (L) 98 - 111 mmol/L   CO2 28 22 - 32 mmol/L   Glucose, Bld 118 (H) 70 - 99 mg/dL   BUN 12 8 - 23 mg/dL   Creatinine, Ser 0.57 0.44 - 1.00 mg/dL   Calcium 8.2 (L) 8.9 - 10.3 mg/dL   GFR calc non Af Amer >60 >60 mL/min   GFR calc Af Amer >60 >60 mL/min   Anion gap 4 (L) 5 - 15  CBC     Status: Abnormal   Collection Time: 06/05/18  2:04 AM  Result Value Ref Range   WBC 3.2 (L) 3.6 - 11.0 K/uL   RBC 3.56 (L) 3.80 - 5.20 MIL/uL   Hemoglobin 11.2 (L) 12.0 - 16.0 g/dL   HCT 34.1 (L) 35.0 - 47.0 %   MCV 95.9 80.0 - 100.0 fL   MCH 31.3 26.0 - 34.0 pg   MCHC 32.7 32.0 - 36.0 g/dL   RDW 15.1 (H) 11.5 - 14.5 %   Platelets 142 (L) 150 - 440 K/uL  Troponin I     Status: Abnormal   Collection Time: 06/05/18  2:04 AM  Result Value Ref Range   Troponin I 0.04 (HH) <0.03 ng/mL  Glucose, capillary     Status: None   Collection Time: 06/05/18  8:09 AM  Result Value Ref Range   Glucose-Capillary 95 70 - 99 mg/dL   Comment 1 Notify RN   Troponin I     Status: Abnormal   Collection Time: 06/05/18  9:51 AM  Result Value Ref Range   Troponin I 0.03 (HH) <0.03 ng/mL   Ct Head Wo Contrast  Result Date: 06/04/2018 CLINICAL DATA:  [place] today, head and neck pain, denies loss of consciousness, history hypertension, breast cancer, emphysema, former smoker EXAM: CT HEAD WITHOUT CONTRAST CT CERVICAL SPINE WITHOUT CONTRAST TECHNIQUE: Multidetector CT imaging of the head and cervical spine was performed following  the standard protocol without intravenous contrast. Multiplanar CT image reconstructions of the cervical spine were also generated. COMPARISON:  03/07/2018 CT head, CT cervical spine 07/25/2014 FINDINGS: CT HEAD FINDINGS Brain: Generalized atrophy. Normal ventricular morphology. No midline shift or mass effect. Otherwise normal appearance of brain parenchyma. No  intracranial hemorrhage, evidence acute infarction, or extra-axial fluid collection. Calcified LEFT parafalcine mass near the vertex 10 mm greatest diameter likely represents a parafalcine meningioma. No additional mass lesions. Vascular: Unremarkable Skull: Demineralized but intact. Small RIGHT supraorbital scalp hematoma. Sinuses/Orbits: Clear Other: N/A CT CERVICAL SPINE FINDINGS Alignment: Normal Skull base and vertebrae: Osseous demineralization. Visualized skull base intact. Vertebral body and disc space heights maintained. No fracture, subluxation or bone destruction. Multilevel facet degenerative changes. Soft tissues and spinal canal: Prevertebral soft tissues normal thickness. Disc levels:  No significant abnormalities Upper chest: BILATERAL pleural effusions. Compressive atelectasis of the posterior upper lobes. Biapical scarring. Other: N/A IMPRESSION: Generalized atrophy. No acute intracranial abnormalities. 10 mm calcified LEFT parafalcine meningioma. Degenerative facet disease changes cervical spine. No acute cervical spine abnormalities. Electronically Signed   By: Lavonia Dana M.D.   On: 06/04/2018 15:49   Ct Cervical Spine Wo Contrast  Result Date: 06/04/2018 CLINICAL DATA:  [place] today, head and neck pain, denies loss of consciousness, history hypertension, breast cancer, emphysema, former smoker EXAM: CT HEAD WITHOUT CONTRAST CT CERVICAL SPINE WITHOUT CONTRAST TECHNIQUE: Multidetector CT imaging of the head and cervical spine was performed following the standard protocol without intravenous contrast. Multiplanar CT image reconstructions of the cervical spine were also generated. COMPARISON:  03/07/2018 CT head, CT cervical spine 07/25/2014 FINDINGS: CT HEAD FINDINGS Brain: Generalized atrophy. Normal ventricular morphology. No midline shift or mass effect. Otherwise normal appearance of brain parenchyma. No intracranial hemorrhage, evidence acute infarction, or extra-axial fluid collection.  Calcified LEFT parafalcine mass near the vertex 10 mm greatest diameter likely represents a parafalcine meningioma. No additional mass lesions. Vascular: Unremarkable Skull: Demineralized but intact. Small RIGHT supraorbital scalp hematoma. Sinuses/Orbits: Clear Other: N/A CT CERVICAL SPINE FINDINGS Alignment: Normal Skull base and vertebrae: Osseous demineralization. Visualized skull base intact. Vertebral body and disc space heights maintained. No fracture, subluxation or bone destruction. Multilevel facet degenerative changes. Soft tissues and spinal canal: Prevertebral soft tissues normal thickness. Disc levels:  No significant abnormalities Upper chest: BILATERAL pleural effusions. Compressive atelectasis of the posterior upper lobes. Biapical scarring. Other: N/A IMPRESSION: Generalized atrophy. No acute intracranial abnormalities. 10 mm calcified LEFT parafalcine meningioma. Degenerative facet disease changes cervical spine. No acute cervical spine abnormalities. Electronically Signed   By: Lavonia Dana M.D.   On: 06/04/2018 15:49   US Carotid Bilateral  Result Date: 06/05/2018 CLINICAL DATA:  Syncopal episode. History of hypertension, CAD and smoking. EXAM: BILATERAL CAROTID DUPLEX ULTRASOUND TECHNIQUE: Pearline Cables scale imaging, color Doppler and duplex ultrasound were performed of bilateral carotid and vertebral arteries in the neck. COMPARISON:  None. FINDINGS: Criteria: Quantification of carotid stenosis is based on velocity parameters that correlate the residual internal carotid diameter with NASCET-based stenosis levels, using the diameter of the distal internal carotid lumen as the denominator for stenosis measurement. The following velocity measurements were obtained: RIGHT ICA:  114/39 cm/sec CCA:  008/67 cm/sec SYSTOLIC ICA/CCA RATIO:  6.19 ECA:  89 cm/sec LEFT ICA:  119/48 cm/sec CCA:  509/32 cm/sec SYSTOLIC ICA/CCA RATIO:  1.0 ECA:  92 cm/sec RIGHT CAROTID ARTERY: There is no grayscale evidence of  significant intimal thickening or atherosclerotic plaque affecting interrogated portions of the right carotid system. There  are no elevated peak systolic velocities within the interrogated course the right internal carotid artery to suggest a hemodynamically significant stenosis. Borderline elevated peak systolic velocity within the distal aspect the right internal carotid artery is felt to be factitious Lea elevated due to sampling at a location of tortuosity. RIGHT VERTEBRAL ARTERY:  Antegrade flow LEFT CAROTID ARTERY: There is no grayscale evidence of significant intimal thickening or atherosclerotic plaque affecting interrogated portions of the left carotid system. There are no elevated peak systolic velocities within the interrogated course of the left internal carotid artery to suggest a hemodynamically significant stenosis. Borderline elevated peak systolic velocity within distal aspect the left internal carotid artery is felt to be factitiously elevated due to sampling at a location of tortuosity. LEFT VERTEBRAL ARTERY:  Antegrade flow IMPRESSION: Unremarkable carotid Doppler ultrasound. Electronically Signed   By: Sandi Mariscal M.D.   On: 06/05/2018 08:16   Dg Chest Portable 1 View  Result Date: 06/04/2018 CLINICAL DATA:  Altered mental status. History of left breast carcinoma EXAM: PORTABLE CHEST 1 VIEW COMPARISON:  March 07, 2018 FINDINGS: There is no appreciable edema or consolidation. Heart is mildly enlarged with pulmonary vascularity normal. No adenopathy. There is aortic atherosclerosis. No pneumothorax. No bone lesions. Status post left mastectomy. IMPRESSION: No edema or consolidation. Mild cardiac enlargement. Aortic atherosclerosis. Status post left mastectomy. Aortic Atherosclerosis (ICD10-I70.0). Electronically Signed   By: Lowella Grip III M.D.   On: 06/04/2018 14:57     ASSESSMENT AND PLAN: Syncopal episode with history of normal left ventricular systolic function on recent  echocardiogram and carotid Doppler unremarkable done on this admission no evidence of CVA or hypoglycemia. Patient does have hyponatremia and hyperkalemia with some hypoxia on presentation. Patient is currently feeling much better and is alert and oriented. Blood pressure systolic was around 97 when she came in and make considers weaning off from clonidine. Hemodynamically appears to be stable and troponin is only mildly elevated in no acute ST changes on EKG. Will get echocardiogram to evaluate wall motion and ejection fraction and agree with stopping Lasix as hyponatremia is improving. She may have had an arrhythmia related to electrolyte disorder. We will watch the patient on monitor closely.  Yaminah Clayborn A

## 2018-06-05 NOTE — Consult Note (Signed)
Date: 06/05/2018                  Patient Name:  Melanie Huffman  MRN: [id number]  DOB: 1928/11/14  Age / Sex: 83 y.o., female         PCP: Dion Body, MD                 Service Requesting Consult: IM/ Loletha Grayer, MD                 Reason for Consult: Hyponatremia            History of Present Illness: Patient is a 84 y.o. female with medical problems of chronic hyponatremia, COPD, hypertension, peripheral vascular disease, congestive heart failure, history of left breast cancer and mastectomy 1991, who was admitted to [facility] on 06/04/2018 for evaluation of syncope.  Family reports increasing weakness over the past several weeks and several falls.  She became unresponsive in the car yesterday afternoon.  In the ER also patient was noted to be unresponsive Best Serum Sodium 137 on 03/08/2018 Admit Na 127, today 128  This morning she feels well.  No shortness of breath.  Currently on nasal cannula oxygen.  Does not use it at home.  Medications: Outpatient medications: Medications Prior to Admission  Medication Sig Dispense Refill Last Dose  . albuterol (PROVENTIL HFA;VENTOLIN HFA) 108 (90 Base) MCG/ACT inhaler Inhale 2 puffs into the lungs every 6 (six) hours as needed for wheezing or shortness of breath.   PRN at PRN  . aspirin EC 81 MG tablet Take 1 tablet (81 mg total) by mouth daily. 30 tablet 0 Past Week at am  . Calcium Carbonate-Vitamin D3 (CALCIUM 600/VITAMIN D) 600-400 MG-UNIT TABS Take 1 tablet by mouth daily.   06/04/2018 at am  . carvedilol (COREG) 25 MG tablet Take 25 mg by mouth 2 (two) times daily with a meal.   06/04/2018 at 0800  . cloNIDine (CATAPRES) 0.1 MG tablet Take 0.1 mg by mouth 2 (two) times daily.   06/04/2018 at am  . fluticasone furoate-vilanterol (BREO ELLIPTA) 100-25 MCG/INH AEPB Inhale 1 puff into the lungs daily.   06/04/2018 at am  . furosemide (LASIX) 20 MG tablet Take 1 tablet (20 mg total) by mouth daily. 30 tablet 0 06/04/2018 at am  . GLUCOSAMINE  HCL PO Take 1 tablet by mouth daily.   06/04/2018 at am  . Multiple Vitamins-Minerals (CENTRUM SILVER PO) Take 1 tablet by mouth daily.   06/04/2018 at am  . omeprazole (PRILOSEC) 20 MG capsule Take 20 mg by mouth daily.    06/04/2018 at am  . potassium chloride SA (K-DUR,KLOR-CON) 20 MEQ tablet Take 1 tablet (20 mEq total) by mouth daily. 30 tablet 0 06/04/2018 at am  . Psyllium (METAMUCIL FIBER PO) Take 5 capsules by mouth 3 (three) times daily.   06/04/2018 at am  . raloxifene (EVISTA) 60 MG tablet Take 60 mg by mouth daily.   06/04/2018 at am  . ramipril (ALTACE) 10 MG capsule Take 20 mg by mouth every morning.   06/04/2018 at am  . temazepam (RESTORIL) 7.5 MG capsule Take 7.5 mg by mouth at bedtime as needed for sleep.   PRN at PRN  . tiotropium (SPIRIVA) 18 MCG inhalation capsule Place 18 mcg into inhaler and inhale daily.    06/04/2018 at am  . vitamin C (ASCORBIC ACID) 500 MG tablet Take 500 mg by mouth daily.   06/04/2018 at am  .  ondansetron (ZOFRAN ODT) 4 MG disintegrating tablet Take 1 tablet (4 mg total) by mouth every 8 (eight) hours as needed for nausea or vomiting. (Patient not taking: Reported on 06/04/2018) 20 tablet 0 Not Taking at Unknown time  . oxyCODONE-acetaminophen (PERCOCET) 5-325 MG tablet Take 1-2 tablets by mouth every 8 (eight) hours as needed. (Patient not taking: Reported on 06/04/2018) 20 tablet 0 Not Taking at Unknown time    Current medications: Current Facility-Administered Medications  Medication Dose Route Frequency Provider Last Rate Last Dose  . 0.9 %  sodium chloride infusion   Intravenous Continuous Loletha Grayer, MD 50 mL/hr at 06/05/18 [phone]    . acetaminophen (TYLENOL) tablet 650 mg  650 mg Oral Q6H PRN Max Sane, MD       Or  . acetaminophen (TYLENOL) suppository 650 mg  650 mg Rectal Q6H PRN Manuella Ghazi, Vipul, MD      . albuterol (PROVENTIL) (2.5 MG/3ML) 0.083% nebulizer solution 3 mL  3 mL Inhalation Q6H PRN Max Sane, MD      . aspirin EC tablet 81 mg  81 mg Oral  Daily Max Sane, MD   81 mg at 06/05/18 0926  . bisacodyl (DULCOLAX) EC tablet 5 mg  5 mg Oral Daily PRN Max Sane, MD      . calcium-vitamin D (OSCAL WITH D) 500-200 MG-UNIT per tablet   Oral Daily Max Sane, MD   1 tablet at 06/05/18 0927  . carvedilol (COREG) tablet 25 mg  25 mg Oral BID WC Max Sane, MD   25 mg at 06/05/18 0927  . cloNIDine (CATAPRES) tablet 0.1 mg  0.1 mg Oral BID Max Sane, MD      . cloNIDine (CATAPRES) tablet 0.1 mg  0.1 mg Oral BID Max Sane, MD   0.1 mg at 06/05/18 0927  . docusate sodium (COLACE) capsule 100 mg  100 mg Oral BID Max Sane, MD   100 mg at 06/05/18 0927  . fluticasone furoate-vilanterol (BREO ELLIPTA) 100-25 MCG/INH 1 puff  1 puff Inhalation Daily Max Sane, MD   1 puff at 06/05/18 0927  . heparin injection 5,000 Units  5,000 Units Subcutaneous Q8H Max Sane, MD   5,000 Units at 06/05/18 [phone]  . HYDROcodone-acetaminophen (NORCO/VICODIN) 5-325 MG per tablet 1-2 tablet  1-2 tablet Oral Q4H PRN Max Sane, MD      . ondansetron (ZOFRAN) tablet 4 mg  4 mg Oral Q6H PRN Max Sane, MD       Or  . ondansetron (ZOFRAN) injection 4 mg  4 mg Intravenous Q6H PRN Max Sane, MD      . pantoprazole (PROTONIX) EC tablet 40 mg  40 mg Oral Daily Max Sane, MD   40 mg at 06/05/18 0927  . raloxifene (EVISTA) tablet 60 mg  60 mg Oral Daily Max Sane, MD   60 mg at 06/05/18 0927  . ramipril (ALTACE) capsule 20 mg  20 mg Oral Blanchard Mane, Vipul, MD   20 mg at 06/05/18 0927  . temazepam (RESTORIL) capsule 7.5 mg  7.5 mg Oral QHS PRN Max Sane, MD      . tiotropium [facility]) inhalation capsule 18 mcg  18 mcg Inhalation Daily Max Sane, MD   18 mcg at 06/05/18 0934  . traZODone (DESYREL) tablet 25 mg  25 mg Oral QHS PRN Max Sane, MD   25 mg at 06/04/18 2145  . vitamin C (ASCORBIC ACID) tablet 500 mg  500 mg Oral Daily Max Sane, MD   500 mg  at 06/05/18 0623      Allergies: Allergies  Allergen Reactions  . Amlodipine Swelling     [Onset:  08/26/2013]feet and neck begin to swell  . Adhesive [Tape] Other (See Comments)    Skin is very thin and tears easily. Please use paper tape only  . Codeine Nausea Only  . Percocet [Oxycodone-Acetaminophen] Nausea And Vomiting    Patient took on an empty stomach. Good relief but unsettled. Still has not eaten today  . Sulfa Antibiotics Nausea Only and Rash    rash      Past Medical History: Past Medical History:  Diagnosis Date  . Arthritis 2019   mostly in neck  . Asthma   . Breast cancer ([place]) 1991   mastectomy left  . Cancer ([place]) 1991   L Breast  . CHF (congestive heart failure) ([place])   . COPD (chronic obstructive pulmonary disease) ([place])    hypoxia with resp failure admission in 03/2018  . Dementia    confusion beginning  . Emphysema of lung ([place])   . GERD (gastroesophageal reflux disease)   . Heart murmur   . History of hiatal hernia   . Hypertension 03/2018   recent hypertension urgency ..admitted in april 2019  . Hypoxia 03/2018   O2 saturation runs 86-91% on Room Air.  . Intermittent dysphagia   . Peripheral vascular disease ([place])   . Shortness of breath dyspnea   . Traumatic subdural hematoma [facility])      Past Surgical History: Past Surgical History:  Procedure Laterality Date  . ABDOMINAL HYSTERECTOMY     Partial  . APPENDECTOMY    . BREAST BIOPSY Right    1980 and 1970's  . BREAST SURGERY  1991   L Mastectomy  . EYE SURGERY Bilateral    cataract extraction  . MASTECTOMY    . ORIF PATELLA Left 04/12/2015   Procedure: OPEN REDUCTION INTERNAL (ORIF) FIXATION PATELLA;  Surgeon: Hessie Knows, MD;  Location: [facility] ORS;  Service: Orthopedics;  Laterality: Left;  . ORIF WRIST FRACTURE Right 05/04/2018   Procedure: OPEN REDUCTION INTERNAL FIXATION (ORIF) WRIST FRACTURE;  Surgeon: Hessie Knows, MD;  Location: [facility] ORS;  Service: Orthopedics;  Laterality: Right;  . PERIPHERAL VASCULAR CATHETERIZATION Left 04/17/2015   Procedure: Lower Extremity Angiography;   Surgeon: Katha Cabal, MD;  Location: [place] [facility];  Service: Cardiovascular;  Laterality: Left;  . PERIPHERAL VASCULAR CATHETERIZATION  04/17/2015   Procedure: Lower Extremity Intervention;  Surgeon: Katha Cabal, MD;  Location: [place] [facility] LAB;  Service: Cardiovascular;;  . TONSILLECTOMY       Family History: Family History  Problem Relation Age of Onset  . CVA Mother   . Kidney disease Mother   . Heart disease Father   . Kidney disease Cousin 100  . Kidney disease Cousin 21  . Breast cancer Neg Hx      Social History: Social History   Socioeconomic History  . Marital status: Widowed    Spouse name: Not on file  . Number of children: Not on file  . Years of education: Not on file  . Highest education level: Not on file  Occupational History  . Not on file  Social Needs  . Financial resource strain: Not on file  . Food insecurity:    Worry: Not on file    Inability: Not on file  . Transportation needs:    Medical: Not on file    Non-medical: Not on file  Tobacco Use  .  Smoking status: Former Smoker    Packs/day: 0.50    Years: 20.00    Pack years: 10.00    Types: Cigarettes    Last attempt to quit: 01/26/1979    Years since quitting: 39.3  . Smokeless tobacco: Never Used  Substance and Sexual Activity  . Alcohol use: No  . Drug use: No  . Sexual activity: Not Currently  Lifestyle  . Physical activity:    Days per week: Not on file    Minutes per session: Not on file  . Stress: Not on file  Relationships  . Social connections:    Talks on phone: Not on file    Gets together: Not on file    Attends religious service: Not on file    Active member of club or organization: Not on file    Attends meetings of clubs or organizations: Not on file    Relationship status: Not on file  . Intimate partner violence:    Fear of current or ex partner: Not on file    Emotionally abused: Not on file    Physically abused: Not on file    Forced  sexual activity: Not on file  Other Topics Concern  . Not on file  Social History Narrative  . Not on file     Review of Systems: Gen: No fevers or chills HEENT: Denies vision or hearing problems CV: No chest pain or shortness of breath Resp: No cough or sputum production GI: Appetite is good, no blood in the stool, no abdominal pain GU : No dysuria, no blood in the urine MS: Right wrist injury from multiple falls Derm:  No acute complaints Psych: No acute complaints Heme: No acute complaints Neuro: No acute complaints Endocrine No acute complaints  Vital Signs: Blood pressure 126/64, pulse 64, temperature 97.6 F (36.4 C), temperature source Oral, resp. rate 20, height 5\' 5"  (1.651 m), weight 61.7 kg (136 lb), SpO2 94 %.   Intake/Output Summary (Last 24 hours) at 06/05/2018 0937 Last data filed at 06/05/2018 0800 Gross per 24 hour  Intake -  Output 550 ml  Net -550 ml    Weight trends: Filed Weights   06/04/18 1429 06/04/18 2028 06/05/18 0406  Weight: 54.4 kg (120 lb) 61.7 kg (136 lb) 61.7 kg (136 lb)    Physical Exam: General:  Frail, elderly, laying in the bed  HEENT  anicteric, moist oral mucous membranes  Neck:  Supple  Lungs:  Decreased breath sounds at bases, otherwise clear, nasal cannula oxygen  Heart::  No rub  Abdomen:  Soft, nontender  Extremities:  Trace edema, right wrist immobilizer  Neurologic:  Alert, oriented, able to answer questions  Skin:  Scattered bruising             Lab results: Basic Metabolic Panel: Recent Labs  Lab 06/04/18 1430 06/05/18 0204  NA 127* 128*  K 5.4* 4.7  CL 94* 96*  CO2 24 28  GLUCOSE 158* 118*  BUN 12 12  CREATININE 0.71 0.57  CALCIUM 8.5* 8.2*    Liver Function Tests: Recent Labs  Lab 06/04/18 1430  AST 36  ALT 39  ALKPHOS 97  BILITOT 0.8  PROT 5.8*  ALBUMIN 3.2*   No results for input(s): LIPASE, AMYLASE in the last 168 hours. No results for input(s): AMMONIA in the last 168  hours.  CBC: Recent Labs  Lab 06/04/18 1430 06/05/18 0204  WBC 4.7 3.2*  NEUTROABS 2.4  --   HGB 13.3 11.2*  HCT 39.1 34.1*  MCV 93.7 95.9  PLT 176 142*    Cardiac Enzymes: Recent Labs  Lab 06/05/18 0204  TROPONINI 0.04*    BNP: Invalid input(s): POCBNP  CBG: Recent Labs  Lab 06/05/18 0809  GLUCAP 95    Microbiology: Recent Results (from the past 720 hour(s))  MRSA PCR Screening     Status: None   Collection Time: 06/04/18  9:48 PM  Result Value Ref Range Status   MRSA by PCR NEGATIVE NEGATIVE Final    Comment:        The GeneXpert MRSA Assay (FDA approved for NASAL specimens only), is one component of a comprehensive MRSA colonization surveillance program. It is not intended to diagnose MRSA infection nor to guide or monitor treatment for MRSA infections. Performed at [facility], [address] [place], [place] [address]      Coagulation Studies: No results for input(s): LABPROT, INR in the last 72 hours.  Urinalysis: Recent Labs    06/04/18 1659  COLORURINE YELLOW*  LABSPEC 1.016  PHURINE 5.0  GLUCOSEU NEGATIVE  HGBUR NEGATIVE  BILIRUBINUR NEGATIVE  KETONESUR NEGATIVE  PROTEINUR 30*  NITRITE NEGATIVE  LEUKOCYTESUR NEGATIVE        Imaging: Ct Head Wo Contrast  Result Date: 06/04/2018 CLINICAL DATA:  [place] today, head and neck pain, denies loss of consciousness, history hypertension, breast cancer, emphysema, former smoker EXAM: CT HEAD WITHOUT CONTRAST CT CERVICAL SPINE WITHOUT CONTRAST TECHNIQUE: Multidetector CT imaging of the head and cervical spine was performed following the standard protocol without intravenous contrast. Multiplanar CT image reconstructions of the cervical spine were also generated. COMPARISON:  03/07/2018 CT head, CT cervical spine 07/25/2014 FINDINGS: CT HEAD FINDINGS Brain: Generalized atrophy. Normal ventricular morphology. No midline shift or mass effect. Otherwise normal appearance of brain  parenchyma. No intracranial hemorrhage, evidence acute infarction, or extra-axial fluid collection. Calcified LEFT parafalcine mass near the vertex 10 mm greatest diameter likely represents a parafalcine meningioma. No additional mass lesions. Vascular: Unremarkable Skull: Demineralized but intact. Small RIGHT supraorbital scalp hematoma. Sinuses/Orbits: Clear Other: N/A CT CERVICAL SPINE FINDINGS Alignment: Normal Skull base and vertebrae: Osseous demineralization. Visualized skull base intact. Vertebral body and disc space heights maintained. No fracture, subluxation or bone destruction. Multilevel facet degenerative changes. Soft tissues and spinal canal: Prevertebral soft tissues normal thickness. Disc levels:  No significant abnormalities Upper chest: BILATERAL pleural effusions. Compressive atelectasis of the posterior upper lobes. Biapical scarring. Other: N/A IMPRESSION: Generalized atrophy. No acute intracranial abnormalities. 10 mm calcified LEFT parafalcine meningioma. Degenerative facet disease changes cervical spine. No acute cervical spine abnormalities. Electronically Signed   By: Lavonia Dana M.D.   On: 06/04/2018 15:49   Ct Cervical Spine Wo Contrast  Result Date: 06/04/2018 CLINICAL DATA:  [place] today, head and neck pain, denies loss of consciousness, history hypertension, breast cancer, emphysema, former smoker EXAM: CT HEAD WITHOUT CONTRAST CT CERVICAL SPINE WITHOUT CONTRAST TECHNIQUE: Multidetector CT imaging of the head and cervical spine was performed following the standard protocol without intravenous contrast. Multiplanar CT image reconstructions of the cervical spine were also generated. COMPARISON:  03/07/2018 CT head, CT cervical spine 07/25/2014 FINDINGS: CT HEAD FINDINGS Brain: Generalized atrophy. Normal ventricular morphology. No midline shift or mass effect. Otherwise normal appearance of brain parenchyma. No intracranial hemorrhage, evidence acute infarction, or extra-axial fluid  collection. Calcified LEFT parafalcine mass near the vertex 10 mm greatest diameter likely represents a parafalcine meningioma. No additional mass lesions. Vascular: Unremarkable Skull: Demineralized but intact. Small RIGHT  supraorbital scalp hematoma. Sinuses/Orbits: Clear Other: N/A CT CERVICAL SPINE FINDINGS Alignment: Normal Skull base and vertebrae: Osseous demineralization. Visualized skull base intact. Vertebral body and disc space heights maintained. No fracture, subluxation or bone destruction. Multilevel facet degenerative changes. Soft tissues and spinal canal: Prevertebral soft tissues normal thickness. Disc levels:  No significant abnormalities Upper chest: BILATERAL pleural effusions. Compressive atelectasis of the posterior upper lobes. Biapical scarring. Other: N/A IMPRESSION: Generalized atrophy. No acute intracranial abnormalities. 10 mm calcified LEFT parafalcine meningioma. Degenerative facet disease changes cervical spine. No acute cervical spine abnormalities. Electronically Signed   By: Lavonia Dana M.D.   On: 06/04/2018 15:49   US Carotid Bilateral  Result Date: 06/05/2018 CLINICAL DATA:  Syncopal episode. History of hypertension, CAD and smoking. EXAM: BILATERAL CAROTID DUPLEX ULTRASOUND TECHNIQUE: Pearline Cables scale imaging, color Doppler and duplex ultrasound were performed of bilateral carotid and vertebral arteries in the neck. COMPARISON:  None. FINDINGS: Criteria: Quantification of carotid stenosis is based on velocity parameters that correlate the residual internal carotid diameter with NASCET-based stenosis levels, using the diameter of the distal internal carotid lumen as the denominator for stenosis measurement. The following velocity measurements were obtained: RIGHT ICA:  114/39 cm/sec CCA:  458/09 cm/sec SYSTOLIC ICA/CCA RATIO:  9.83 ECA:  89 cm/sec LEFT ICA:  119/48 cm/sec CCA:  382/50 cm/sec SYSTOLIC ICA/CCA RATIO:  1.0 ECA:  92 cm/sec RIGHT CAROTID ARTERY: There is no grayscale  evidence of significant intimal thickening or atherosclerotic plaque affecting interrogated portions of the right carotid system. There are no elevated peak systolic velocities within the interrogated course the right internal carotid artery to suggest a hemodynamically significant stenosis. Borderline elevated peak systolic velocity within the distal aspect the right internal carotid artery is felt to be factitious Lea elevated due to sampling at a location of tortuosity. RIGHT VERTEBRAL ARTERY:  Antegrade flow LEFT CAROTID ARTERY: There is no grayscale evidence of significant intimal thickening or atherosclerotic plaque affecting interrogated portions of the left carotid system. There are no elevated peak systolic velocities within the interrogated course of the left internal carotid artery to suggest a hemodynamically significant stenosis. Borderline elevated peak systolic velocity within distal aspect the left internal carotid artery is felt to be factitiously elevated due to sampling at a location of tortuosity. LEFT VERTEBRAL ARTERY:  Antegrade flow IMPRESSION: Unremarkable carotid Doppler ultrasound. Electronically Signed   By: Sandi Mariscal M.D.   On: 06/05/2018 08:16   Dg Chest Portable 1 View  Result Date: 06/04/2018 CLINICAL DATA:  Altered mental status. History of left breast carcinoma EXAM: PORTABLE CHEST 1 VIEW COMPARISON:  March 07, 2018 FINDINGS: There is no appreciable edema or consolidation. Heart is mildly enlarged with pulmonary vascularity normal. No adenopathy. There is aortic atherosclerosis. No pneumothorax. No bone lesions. Status post left mastectomy. IMPRESSION: No edema or consolidation. Mild cardiac enlargement. Aortic atherosclerosis. Status post left mastectomy. Aortic Atherosclerosis (ICD10-I70.0). Electronically Signed   By: Lowella Grip III M.D.   On: 06/04/2018 14:57      Assessment & Plan: Pt is a 84 y.o. Caucasian  female with hypertension, CHF, COPD, GERD, history of  breast cancer, was admitted on 06/04/2018 for evaluation  Patient has history of chronic hyponatremia.  On March 08, 2018, sodium was normal at 137.  This time, her admit sodium is 127   1. Chronic hyponatremia Urine studies are pending.  TSH is normal at 3.920.Chest x-ray negative for any mass This level of hyponatremia is unlikely to be contributing to patient's falls  at home Plan: Obtain SPEP, UPEP Follow-up on urine studies Currently getting treatment with IV normal saline 50 cc an hour Will follow       LOS: 0 Tregan Read Candiss Norse 7/6/20199:37 AM  [place], [place]  Note: This note was prepared with Dragon dictation. Any transcription errors are unintentional

## 2018-06-05 NOTE — Progress Notes (Signed)
*  PRELIMINARY RESULTS* Echocardiogram 2D Echocardiogram has been performed.  Melanie Huffman 06/05/2018, 4:18 PM

## 2018-06-05 NOTE — Progress Notes (Addendum)
Patient ID: Melanie Huffman, female   DOB: August 19, 1928, 84 y.o.   MRN: [id number]  [facility] PROGRESS NOTE  Melanie Huffman:[id number] DOB: May 02, 1928 DOA: 06/04/2018 PCP: Dion Body, MD  HPI/Subjective: Patient feeling well today and was hoping to go home.  Sodium still low despite IV fluid hydration.  Objective: Vitals:   06/05/18 0926 06/05/18 0929  BP: 126/64 126/64  Pulse:  64  Resp:  20  Temp:    SpO2:  94%    Filed Weights   06/04/18 1429 06/04/18 2028 06/05/18 0406  Weight: 54.4 kg (120 lb) 61.7 kg (136 lb) 61.7 kg (136 lb)    ROS: Review of Systems  Constitutional: Negative for chills and fever.  Eyes: Negative for blurred vision.  Respiratory: Negative for cough and shortness of breath.   Cardiovascular: Negative for chest pain.  Gastrointestinal: Negative for abdominal pain, constipation, diarrhea, nausea and vomiting.  Genitourinary: Negative for dysuria.  Musculoskeletal: Negative for joint pain.  Neurological: Negative for dizziness and headaches.   Exam: Physical Exam  Constitutional: She is oriented to person, place, and time.  HENT:  Nose: No mucosal edema.  Mouth/Throat: No oropharyngeal exudate or posterior oropharyngeal edema.  Eyes: Pupils are equal, round, and reactive to light. Conjunctivae, EOM and lids are normal.  Neck: No JVD present. Carotid bruit is not present. No edema present. No thyroid mass and no thyromegaly present.  Cardiovascular: S1 normal and S2 normal. Exam reveals no gallop.  No murmur heard. Pulses:      Dorsalis pedis pulses are 2+ on the right side, and 2+ on the left side.  Respiratory: No respiratory distress. She has no wheezes. She has no rhonchi. She has no rales.  GI: Soft. Bowel sounds are normal. There is no tenderness.  Musculoskeletal:       Right ankle: She exhibits no swelling.       Left ankle: She exhibits no swelling.  Lymphadenopathy:    She has no cervical adenopathy.  Neurological: She is  alert and oriented to person, place, and time. No cranial nerve deficit.  Skin: Skin is warm. Nails show no clubbing.  Bruise right forehead.  Psychiatric: She has a normal mood and affect.      Data Reviewed: Basic Metabolic Panel: Recent Labs  Lab 06/04/18 1430 06/05/18 0204  NA 127* 128*  K 5.4* 4.7  CL 94* 96*  CO2 24 28  GLUCOSE 158* 118*  BUN 12 12  CREATININE 0.71 0.57  CALCIUM 8.5* 8.2*   Liver Function Tests: Recent Labs  Lab 06/04/18 1430  AST 36  ALT 39  ALKPHOS 97  BILITOT 0.8  PROT 5.8*  ALBUMIN 3.2*   CBC: Recent Labs  Lab 06/04/18 1430 06/05/18 0204  WBC 4.7 3.2*  NEUTROABS 2.4  --   HGB 13.3 11.2*  HCT 39.1 34.1*  MCV 93.7 95.9  PLT 176 142*   Cardiac Enzymes: Recent Labs  Lab 06/04/18 1430 06/04/18 2055 06/05/18 0204 06/05/18 0951  TROPONINI <0.03 0.09* 0.04* 0.03*    CBG: Recent Labs  Lab 06/05/18 0809  GLUCAP 95    Recent Results (from the past 240 hour(s))  MRSA PCR Screening     Status: None   Collection Time: 06/04/18  9:48 PM  Result Value Ref Range Status   MRSA by PCR NEGATIVE NEGATIVE Final    Comment:        The GeneXpert MRSA Assay (FDA approved for NASAL specimens only), is one component of a  comprehensive MRSA colonization surveillance program. It is not intended to diagnose MRSA infection nor to guide or monitor treatment for MRSA infections. Performed at [facility], [address] [place], [place] [address]      Studies: Ct Head Wo Contrast  Result Date: 06/04/2018 CLINICAL DATA:  [place] today, head and neck pain, denies loss of consciousness, history hypertension, breast cancer, emphysema, former smoker EXAM: CT HEAD WITHOUT CONTRAST CT CERVICAL SPINE WITHOUT CONTRAST TECHNIQUE: Multidetector CT imaging of the head and cervical spine was performed following the standard protocol without intravenous contrast. Multiplanar CT image reconstructions of the cervical spine were also generated.  COMPARISON:  03/07/2018 CT head, CT cervical spine 07/25/2014 FINDINGS: CT HEAD FINDINGS Brain: Generalized atrophy. Normal ventricular morphology. No midline shift or mass effect. Otherwise normal appearance of brain parenchyma. No intracranial hemorrhage, evidence acute infarction, or extra-axial fluid collection. Calcified LEFT parafalcine mass near the vertex 10 mm greatest diameter likely represents a parafalcine meningioma. No additional mass lesions. Vascular: Unremarkable Skull: Demineralized but intact. Small RIGHT supraorbital scalp hematoma. Sinuses/Orbits: Clear Other: N/A CT CERVICAL SPINE FINDINGS Alignment: Normal Skull base and vertebrae: Osseous demineralization. Visualized skull base intact. Vertebral body and disc space heights maintained. No fracture, subluxation or bone destruction. Multilevel facet degenerative changes. Soft tissues and spinal canal: Prevertebral soft tissues normal thickness. Disc levels:  No significant abnormalities Upper chest: BILATERAL pleural effusions. Compressive atelectasis of the posterior upper lobes. Biapical scarring. Other: N/A IMPRESSION: Generalized atrophy. No acute intracranial abnormalities. 10 mm calcified LEFT parafalcine meningioma. Degenerative facet disease changes cervical spine. No acute cervical spine abnormalities. Electronically Signed   By: Lavonia Dana M.D.   On: 06/04/2018 15:49   Ct Cervical Spine Wo Contrast  Result Date: 06/04/2018 CLINICAL DATA:  [place] today, head and neck pain, denies loss of consciousness, history hypertension, breast cancer, emphysema, former smoker EXAM: CT HEAD WITHOUT CONTRAST CT CERVICAL SPINE WITHOUT CONTRAST TECHNIQUE: Multidetector CT imaging of the head and cervical spine was performed following the standard protocol without intravenous contrast. Multiplanar CT image reconstructions of the cervical spine were also generated. COMPARISON:  03/07/2018 CT head, CT cervical spine 07/25/2014 FINDINGS: CT HEAD FINDINGS  Brain: Generalized atrophy. Normal ventricular morphology. No midline shift or mass effect. Otherwise normal appearance of brain parenchyma. No intracranial hemorrhage, evidence acute infarction, or extra-axial fluid collection. Calcified LEFT parafalcine mass near the vertex 10 mm greatest diameter likely represents a parafalcine meningioma. No additional mass lesions. Vascular: Unremarkable Skull: Demineralized but intact. Small RIGHT supraorbital scalp hematoma. Sinuses/Orbits: Clear Other: N/A CT CERVICAL SPINE FINDINGS Alignment: Normal Skull base and vertebrae: Osseous demineralization. Visualized skull base intact. Vertebral body and disc space heights maintained. No fracture, subluxation or bone destruction. Multilevel facet degenerative changes. Soft tissues and spinal canal: Prevertebral soft tissues normal thickness. Disc levels:  No significant abnormalities Upper chest: BILATERAL pleural effusions. Compressive atelectasis of the posterior upper lobes. Biapical scarring. Other: N/A IMPRESSION: Generalized atrophy. No acute intracranial abnormalities. 10 mm calcified LEFT parafalcine meningioma. Degenerative facet disease changes cervical spine. No acute cervical spine abnormalities. Electronically Signed   By: Lavonia Dana M.D.   On: 06/04/2018 15:49   US Carotid Bilateral  Result Date: 06/05/2018 CLINICAL DATA:  Syncopal episode. History of hypertension, CAD and smoking. EXAM: BILATERAL CAROTID DUPLEX ULTRASOUND TECHNIQUE: Pearline Cables scale imaging, color Doppler and duplex ultrasound were performed of bilateral carotid and vertebral arteries in the neck. COMPARISON:  None. FINDINGS: Criteria: Quantification of carotid stenosis is based on velocity parameters that correlate  the residual internal carotid diameter with NASCET-based stenosis levels, using the diameter of the distal internal carotid lumen as the denominator for stenosis measurement. The following velocity measurements were obtained: RIGHT ICA:   114/39 cm/sec CCA:  850/27 cm/sec SYSTOLIC ICA/CCA RATIO:  7.41 ECA:  89 cm/sec LEFT ICA:  119/48 cm/sec CCA:  287/86 cm/sec SYSTOLIC ICA/CCA RATIO:  1.0 ECA:  92 cm/sec RIGHT CAROTID ARTERY: There is no grayscale evidence of significant intimal thickening or atherosclerotic plaque affecting interrogated portions of the right carotid system. There are no elevated peak systolic velocities within the interrogated course the right internal carotid artery to suggest a hemodynamically significant stenosis. Borderline elevated peak systolic velocity within the distal aspect the right internal carotid artery is felt to be factitious Lea elevated due to sampling at a location of tortuosity. RIGHT VERTEBRAL ARTERY:  Antegrade flow LEFT CAROTID ARTERY: There is no grayscale evidence of significant intimal thickening or atherosclerotic plaque affecting interrogated portions of the left carotid system. There are no elevated peak systolic velocities within the interrogated course of the left internal carotid artery to suggest a hemodynamically significant stenosis. Borderline elevated peak systolic velocity within distal aspect the left internal carotid artery is felt to be factitiously elevated due to sampling at a location of tortuosity. LEFT VERTEBRAL ARTERY:  Antegrade flow IMPRESSION: Unremarkable carotid Doppler ultrasound. Electronically Signed   By: Sandi Mariscal M.D.   On: 06/05/2018 08:16   Dg Chest Portable 1 View  Result Date: 06/04/2018 CLINICAL DATA:  Altered mental status. History of left breast carcinoma EXAM: PORTABLE CHEST 1 VIEW COMPARISON:  March 07, 2018 FINDINGS: There is no appreciable edema or consolidation. Heart is mildly enlarged with pulmonary vascularity normal. No adenopathy. There is aortic atherosclerosis. No pneumothorax. No bone lesions. Status post left mastectomy. IMPRESSION: No edema or consolidation. Mild cardiac enlargement. Aortic atherosclerosis. Status post left mastectomy. Aortic  Atherosclerosis (ICD10-I70.0). Electronically Signed   By: Lowella Grip III M.D.   On: 06/04/2018 14:57    Scheduled Meds: . aspirin EC  81 mg Oral Daily  . calcium-vitamin D   Oral Daily  . carvedilol  25 mg Oral BID WC  . cloNIDine  0.1 mg Oral BID  . docusate sodium  100 mg Oral BID  . fluticasone furoate-vilanterol  1 puff Inhalation Daily  . heparin  5,000 Units Subcutaneous Q8H  . pantoprazole  40 mg Oral Daily  . raloxifene  60 mg Oral Daily  . ramipril  20 mg Oral [id number]  . tiotropium  18 mcg Inhalation Daily  . vitamin C  500 mg Oral Daily   Continuous Infusions: . sodium chloride 50 mL/hr at 06/05/18 0926    Assessment/Plan:  1. Syncope.  Check orthostatic vital signs.  Continue to monitor on IV fluids today.  Echocardiogram pending.  Currently no arrhythmias on telemetry.  Physical therapy evaluation. 2. Hyponatremia.  Patient states that she is eating okay and has no diarrhea and eats a balanced diet.  Looking back she did have a sodium in the past that was very low also.  We will get nephrology consultation.  Monitor on IV fluids.  TSH normal range.  Chest x-ray no mass. 3. Hyperkalemia.  Stopped potassium replacement. 4. Recurrent falls.  Physical therapy evaluation. 5. Hypertension.  Try to taper clonidine down to once a day dosing. 6. GERD on PPI  Code Status:     Code Status Orders  (From admission, onward)        Start  Ordered   06/04/18 2000  Full code  Continuous     06/04/18 1959    Code Status History    Date Active Date Inactive Code Status Order ID Comments User Context   06/04/2018 1832 06/04/2018 1959 DNR [id number]  Max Sane, MD ED   03/08/2018 0008 03/09/2018 1733 Full Code [id number]  Lance Coon, MD Inpatient   10/31/2016 0237 11/04/2016 2008 Full Code [id number]  Lance Coon, MD Inpatient   07/09/2016 1558 07/10/2016 1914 Full Code [id number]  Henreitta Leber, MD Inpatient   04/12/2015 1949 04/16/2015 1818 Full Code [id number]  Hessie Knows, MD Inpatient   04/11/2015 1252 04/12/2015 1949 Full Code [id number]  Hessie Knows, MD Inpatient   07/26/2014 0445 07/26/2014 1934 Full Code [id number]  Ashok Pall, MD Inpatient    Advance Directive Documentation     Most Recent Value  Type of Advance Directive  Out of facility DNR (pink MOST or yellow form)  Pre-existing out of facility DNR order (yellow form or pink MOST form)  Physician notified to receive inpatient order  "MOST" Form in Place?  -     Family Communication: spoke with daughter at [phone] Disposition Plan: Hopefully home tomorrow  Consultants:  Cardiology  Nephrology  Time spent: 28 minutes  [place]

## 2018-06-06 DIAGNOSIS — R296 Repeated falls: Secondary | ICD-10-CM | POA: Diagnosis not present

## 2018-06-06 DIAGNOSIS — R55 Syncope and collapse: Secondary | ICD-10-CM | POA: Diagnosis not present

## 2018-06-06 DIAGNOSIS — E871 Hypo-osmolality and hyponatremia: Secondary | ICD-10-CM | POA: Diagnosis not present

## 2018-06-06 DIAGNOSIS — E875 Hyperkalemia: Secondary | ICD-10-CM | POA: Diagnosis not present

## 2018-06-06 LAB — BASIC METABOLIC PANEL
ANION GAP: 5 (ref 5–15)
BUN: 11 mg/dL (ref 8–23)
CALCIUM: 8.4 mg/dL — AB (ref 8.9–10.3)
CO2: 28 mmol/L (ref 22–32)
Chloride: 93 mmol/L — ABNORMAL LOW (ref 98–111)
Creatinine, Ser: 0.54 mg/dL (ref 0.44–1.00)
GLUCOSE: 105 mg/dL — AB (ref 70–99)
POTASSIUM: 4.9 mmol/L (ref 3.5–5.1)
SODIUM: 126 mmol/L — AB (ref 135–145)

## 2018-06-06 LAB — OSMOLALITY, URINE: OSMOLALITY UR: 613 mosm/kg (ref 300–900)

## 2018-06-06 LAB — GLUCOSE, CAPILLARY: GLUCOSE-CAPILLARY: 111 mg/dL — AB (ref 70–99)

## 2018-06-06 LAB — SODIUM: SODIUM: 130 mmol/L — AB (ref 135–145)

## 2018-06-06 MED ORDER — HYDRALAZINE HCL 25 MG PO TABS
25.0000 mg | ORAL_TABLET | Freq: Two times a day (BID) | ORAL | Status: DC
  Administered 2018-06-06 (×2): 25 mg via ORAL
  Filled 2018-06-06 (×2): qty 1

## 2018-06-06 MED ORDER — TOLVAPTAN 15 MG PO TABS
15.0000 mg | ORAL_TABLET | ORAL | Status: DC
  Administered 2018-06-06: 15 mg via ORAL
  Filled 2018-06-06 (×2): qty 1

## 2018-06-06 NOTE — Progress Notes (Signed)
Patient ID: Melanie Huffman, female   DOB: Feb 13, 1928, 84 y.o.   MRN: [id number]  Patient ID: Melanie Huffman, female   DOB: [birth year], 84 y.o.   MRN: [id number]  [facility] PROGRESS NOTE  Melanie Huffman FIE:[id number] DOB: [birth year] DOA: 06/04/2018 PCP: Dion Body, MD  HPI/Subjective: Patient not feeling as well today as yesterday.  Some shortness of breath.  Still does not know why she passed out.  Objective: Vitals:   06/06/18 0355 06/06/18 0801  BP: 139/87 (!) 161/86  Pulse: 71 67  Resp: 18   Temp: (!) 97.3 F (36.3 C) 97.6 F (36.4 C)  SpO2: 90% 95%    Filed Weights   06/04/18 1429 06/04/18 2028 06/05/18 0406  Weight: 54.4 kg (120 lb) 61.7 kg (136 lb) 61.7 kg (136 lb)    ROS: Review of Systems  Constitutional: Negative for chills and fever.  Eyes: Negative for blurred vision.  Respiratory: Negative for cough and shortness of breath.   Cardiovascular: Negative for chest pain.  Gastrointestinal: Negative for abdominal pain, constipation, diarrhea, nausea and vomiting.  Genitourinary: Negative for dysuria.  Musculoskeletal: Negative for joint pain.  Neurological: Negative for dizziness and headaches.   Exam: Physical Exam  Constitutional: She is oriented to person, place, and time.  HENT:  Nose: No mucosal edema.  Mouth/Throat: No oropharyngeal exudate or posterior oropharyngeal edema.  Eyes: Pupils are equal, round, and reactive to light. Conjunctivae, EOM and lids are normal.  Neck: No JVD present. Carotid bruit is not present. No edema present. No thyroid mass and no thyromegaly present.  Cardiovascular: S1 normal and S2 normal. Exam reveals no gallop.  No murmur heard. Pulses:      Dorsalis pedis pulses are 2+ on the right side, and 2+ on the left side.  Respiratory: No respiratory distress. She has no wheezes. She has no rhonchi. She has no rales.  GI: Soft. Bowel sounds are normal. There is no tenderness.  Musculoskeletal:       Right ankle: She  exhibits no swelling.       Left ankle: She exhibits no swelling.  Lymphadenopathy:    She has no cervical adenopathy.  Neurological: She is alert and oriented to person, place, and time. No cranial nerve deficit.  Skin: Skin is warm. Nails show no clubbing.  Bruise right forehead.  Chronic lower extremity discoloration.  Psychiatric: She has a normal mood and affect.      Data Reviewed: Basic Metabolic Panel: Recent Labs  Lab 06/04/18 1430 06/05/18 0204 06/06/18 0706  NA 127* 128* 126*  K 5.4* 4.7 4.9  CL 94* 96* 93*  CO2 24 28 28   GLUCOSE 158* 118* 105*  BUN 12 12 11   CREATININE 0.71 0.57 0.54  CALCIUM 8.5* 8.2* 8.4*   Liver Function Tests: Recent Labs  Lab 06/04/18 1430  AST 36  ALT 39  ALKPHOS 97  BILITOT 0.8  PROT 5.8*  ALBUMIN 3.2*   CBC: Recent Labs  Lab 06/04/18 1430 06/05/18 0204  WBC 4.7 3.2*  NEUTROABS 2.4  --   HGB 13.3 11.2*  HCT 39.1 34.1*  MCV 93.7 95.9  PLT 176 142*   Cardiac Enzymes: Recent Labs  Lab 06/04/18 1430 06/04/18 2055 06/05/18 0204 06/05/18 0951  TROPONINI <0.03 0.09* 0.04* 0.03*    CBG: Recent Labs  Lab 06/05/18 0809 06/06/18 0754  GLUCAP 95 111*    Recent Results (from the past 240 hour(s))  MRSA PCR Screening     Status: None  Collection Time: 06/04/18  9:48 PM  Result Value Ref Range Status   MRSA by PCR NEGATIVE NEGATIVE Final    Comment:        The GeneXpert MRSA Assay (FDA approved for NASAL specimens only), is one component of a comprehensive MRSA colonization surveillance program. It is not intended to diagnose MRSA infection nor to guide or monitor treatment for MRSA infections. Performed at [facility], [address] [place], [place] [address]      Studies: Ct Head Wo Contrast  Result Date: 06/04/2018 CLINICAL DATA:  [place] today, head and neck pain, denies loss of consciousness, history hypertension, breast cancer, emphysema, former smoker EXAM: CT HEAD WITHOUT CONTRAST CT  CERVICAL SPINE WITHOUT CONTRAST TECHNIQUE: Multidetector CT imaging of the head and cervical spine was performed following the standard protocol without intravenous contrast. Multiplanar CT image reconstructions of the cervical spine were also generated. COMPARISON:  03/07/2018 CT head, CT cervical spine 07/25/2014 FINDINGS: CT HEAD FINDINGS Brain: Generalized atrophy. Normal ventricular morphology. No midline shift or mass effect. Otherwise normal appearance of brain parenchyma. No intracranial hemorrhage, evidence acute infarction, or extra-axial fluid collection. Calcified LEFT parafalcine mass near the vertex 10 mm greatest diameter likely represents a parafalcine meningioma. No additional mass lesions. Vascular: Unremarkable Skull: Demineralized but intact. Small RIGHT supraorbital scalp hematoma. Sinuses/Orbits: Clear Other: N/A CT CERVICAL SPINE FINDINGS Alignment: Normal Skull base and vertebrae: Osseous demineralization. Visualized skull base intact. Vertebral body and disc space heights maintained. No fracture, subluxation or bone destruction. Multilevel facet degenerative changes. Soft tissues and spinal canal: Prevertebral soft tissues normal thickness. Disc levels:  No significant abnormalities Upper chest: BILATERAL pleural effusions. Compressive atelectasis of the posterior upper lobes. Biapical scarring. Other: N/A IMPRESSION: Generalized atrophy. No acute intracranial abnormalities. 10 mm calcified LEFT parafalcine meningioma. Degenerative facet disease changes cervical spine. No acute cervical spine abnormalities. Electronically Signed   By: Lavonia Dana M.D.   On: 06/04/2018 15:49   Ct Cervical Spine Wo Contrast  Result Date: 06/04/2018 CLINICAL DATA:  [place] today, head and neck pain, denies loss of consciousness, history hypertension, breast cancer, emphysema, former smoker EXAM: CT HEAD WITHOUT CONTRAST CT CERVICAL SPINE WITHOUT CONTRAST TECHNIQUE: Multidetector CT imaging of the head and  cervical spine was performed following the standard protocol without intravenous contrast. Multiplanar CT image reconstructions of the cervical spine were also generated. COMPARISON:  03/07/2018 CT head, CT cervical spine 07/25/2014 FINDINGS: CT HEAD FINDINGS Brain: Generalized atrophy. Normal ventricular morphology. No midline shift or mass effect. Otherwise normal appearance of brain parenchyma. No intracranial hemorrhage, evidence acute infarction, or extra-axial fluid collection. Calcified LEFT parafalcine mass near the vertex 10 mm greatest diameter likely represents a parafalcine meningioma. No additional mass lesions. Vascular: Unremarkable Skull: Demineralized but intact. Small RIGHT supraorbital scalp hematoma. Sinuses/Orbits: Clear Other: N/A CT CERVICAL SPINE FINDINGS Alignment: Normal Skull base and vertebrae: Osseous demineralization. Visualized skull base intact. Vertebral body and disc space heights maintained. No fracture, subluxation or bone destruction. Multilevel facet degenerative changes. Soft tissues and spinal canal: Prevertebral soft tissues normal thickness. Disc levels:  No significant abnormalities Upper chest: BILATERAL pleural effusions. Compressive atelectasis of the posterior upper lobes. Biapical scarring. Other: N/A IMPRESSION: Generalized atrophy. No acute intracranial abnormalities. 10 mm calcified LEFT parafalcine meningioma. Degenerative facet disease changes cervical spine. No acute cervical spine abnormalities. Electronically Signed   By: Lavonia Dana M.D.   On: 06/04/2018 15:49   US Carotid Bilateral  Result Date: 06/05/2018 CLINICAL DATA:  Syncopal episode. History  of hypertension, CAD and smoking. EXAM: BILATERAL CAROTID DUPLEX ULTRASOUND TECHNIQUE: Pearline Cables scale imaging, color Doppler and duplex ultrasound were performed of bilateral carotid and vertebral arteries in the neck. COMPARISON:  None. FINDINGS: Criteria: Quantification of carotid stenosis is based on velocity  parameters that correlate the residual internal carotid diameter with NASCET-based stenosis levels, using the diameter of the distal internal carotid lumen as the denominator for stenosis measurement. The following velocity measurements were obtained: RIGHT ICA:  114/39 cm/sec CCA:  573/22 cm/sec SYSTOLIC ICA/CCA RATIO:  0.25 ECA:  89 cm/sec LEFT ICA:  119/48 cm/sec CCA:  427/06 cm/sec SYSTOLIC ICA/CCA RATIO:  1.0 ECA:  92 cm/sec RIGHT CAROTID ARTERY: There is no grayscale evidence of significant intimal thickening or atherosclerotic plaque affecting interrogated portions of the right carotid system. There are no elevated peak systolic velocities within the interrogated course the right internal carotid artery to suggest a hemodynamically significant stenosis. Borderline elevated peak systolic velocity within the distal aspect the right internal carotid artery is felt to be factitious Lea elevated due to sampling at a location of tortuosity. RIGHT VERTEBRAL ARTERY:  Antegrade flow LEFT CAROTID ARTERY: There is no grayscale evidence of significant intimal thickening or atherosclerotic plaque affecting interrogated portions of the left carotid system. There are no elevated peak systolic velocities within the interrogated course of the left internal carotid artery to suggest a hemodynamically significant stenosis. Borderline elevated peak systolic velocity within distal aspect the left internal carotid artery is felt to be factitiously elevated due to sampling at a location of tortuosity. LEFT VERTEBRAL ARTERY:  Antegrade flow IMPRESSION: Unremarkable carotid Doppler ultrasound. Electronically Signed   By: Sandi Mariscal M.D.   On: 06/05/2018 08:16   Dg Chest Portable 1 View  Result Date: 06/04/2018 CLINICAL DATA:  Altered mental status. History of left breast carcinoma EXAM: PORTABLE CHEST 1 VIEW COMPARISON:  March 07, 2018 FINDINGS: There is no appreciable edema or consolidation. Heart is mildly enlarged with  pulmonary vascularity normal. No adenopathy. There is aortic atherosclerosis. No pneumothorax. No bone lesions. Status post left mastectomy. IMPRESSION: No edema or consolidation. Mild cardiac enlargement. Aortic atherosclerosis. Status post left mastectomy. Aortic Atherosclerosis (ICD10-I70.0). Electronically Signed   By: Lowella Grip III M.D.   On: 06/04/2018 14:57    Scheduled Meds: . aspirin EC  81 mg Oral Daily  . calcium-vitamin D   Oral Daily  . carvedilol  25 mg Oral BID WC  . docusate sodium  100 mg Oral BID  . fluticasone furoate-vilanterol  1 puff Inhalation Daily  . heparin  5,000 Units Subcutaneous Q8H  . hydrALAZINE  25 mg Oral BID  . pantoprazole  40 mg Oral Daily  . raloxifene  60 mg Oral Daily  . ramipril  20 mg Oral BH-q7a  . tiotropium  18 mcg Inhalation Daily  . tolvaptan  15 mg Oral Q24H  . vitamin C  500 mg Oral Daily    Assessment/Plan:  1. Syncope.  Patient not orthostatic.  Discontinue IV fluids.  So far no arrhythmias on telemetry.  Physical therapy evaluation appreciated.  Echocardiogram showed slightly reduced ejection fraction. 2. Hyponatremia.  Patient has SIADH.  Nephrology ordered a dose of tolvaptan to be given.  Serial sodiums ordered.  Monitor on IV fluids.  TSH normal range.  Chest x-ray no mass. 3. Hyperkalemia.  Stopped potassium replacement.  May need to change ramipril if potassium still become elevated. 4. Essential hypertension.  Blood pressure low on presentation.  Starting to trend higher with  me decreasing dose of clonidine.  I will get rid of clonidine and use hydralazine instead which she has been taking at home. 5. Recurrent falls.  Physical therapy evaluation appreciated.  Physical therapy recommended home with home health. 6. GERD on PPI 7. Chronic mid range congestive heart failure.  Tolvaptan should help mobilize the fluids we gave during the hospital course.  Code Status:     Code Status Orders  (From admission, onward)         Start     Ordered   06/04/18 2000  Full code  Continuous     06/04/18 1959    Code Status History    Date Active Date Inactive Code Status Order ID Comments User Context   06/04/2018 1832 06/04/2018 1959 DNR [id number]  Max Sane, MD ED   03/08/2018 0008 03/09/2018 1733 Full Code [id number]  Lance Coon, MD Inpatient   10/31/2016 0237 11/04/2016 2008 Full Code [id number]  Lance Coon, MD Inpatient   07/09/2016 1558 07/10/2016 1914 Full Code [id number]  Henreitta Leber, MD Inpatient   04/12/2015 1949 04/16/2015 1818 Full Code [id number]  Hessie Knows, MD Inpatient   04/11/2015 1252 04/12/2015 1949 Full Code [id number]  Hessie Knows, MD Inpatient   07/26/2014 0445 07/26/2014 1934 Full Code [id number]  Ashok Pall, MD Inpatient    Advance Directive Documentation     Most Recent Value  Type of Advance Directive  Out of facility DNR (pink MOST or yellow form)  Pre-existing out of facility DNR order (yellow form or pink MOST form)  Physician notified to receive inpatient order  "MOST" Form in Place?  -     Family Communication: spoke with daughter at the bedside today.  Her phone number is [phone] Disposition Plan:  recheck sodium again tomorrow  Consultants:  Cardiology  Nephrology  Time spent: 28 minutes, case discussed with nephrology and daughter at the bedside.  Wilhelmena Zea [employer]

## 2018-06-06 NOTE — Progress Notes (Signed)
[facility], [place] 06/06/18  Subjective:   Patient is doing fair today.  Able to eat without nausea or vomiting.  Ate a good dinner yesterday but not had with this morning.  No leg edema.  Mild shortness of breath, requiring oxygen by nasal cannula.  IV fluids discontinued this morning because sodium level further dropped down to 126.2D echo  This admission shows LVEF 45%, PA pressure 45 mm with diffuse hypokinesis, diastolic dysfunction grade 3  Objective:  Vital signs in last 24 hours:  Temp:  [97.3 F (36.3 C)-98.3 F (36.8 C)] 97.6 F (36.4 C) (07/07 0801) Pulse Rate:  [61-98] 67 (07/07 0801) Resp:  [17-20] 18 (07/07 0355) BP: (126-161)/(64-107) 161/86 (07/07 0801) SpO2:  [90 %-97 %] 95 % (07/07 0801)  Weight change:  Filed Weights   06/04/18 1429 06/04/18 2028 06/05/18 0406  Weight: 54.4 kg (120 lb) 61.7 kg (136 lb) 61.7 kg (136 lb)    Intake/Output:    Intake/Output Summary (Last 24 hours) at 06/06/2018 0927 Last data filed at 06/06/2018 0854 Gross per 24 hour  Intake 2163.33 ml  Output 300 ml  Net 1863.33 ml     Physical Exam: General:  Laying in the bed, no acute distress  HEENT  anicteric, moist oral mucous membranes  Neck  supple   Pulm/lungs  mild basilar crackles, normal breathing effort, O2 by Newman,  CVS/Heart  no rub or gallop  Abdomen:   Soft, nontender  Extremities:  No edema   Neurologic: alert, oriented             Basic Metabolic Panel:  Recent Labs  Lab 06/04/18 1430 06/05/18 0204 06/06/18 0706  NA 127* 128* 126*  K 5.4* 4.7 4.9  CL 94* 96* 93*  CO2 24 28 28   GLUCOSE 158* 118* 105*  BUN 12 12 11   CREATININE 0.71 0.57 0.54  CALCIUM 8.5* 8.2* 8.4*     CBC: Recent Labs  Lab 06/04/18 1430 06/05/18 0204  WBC 4.7 3.2*  NEUTROABS 2.4  --   HGB 13.3 11.2*  HCT 39.1 34.1*  MCV 93.7 95.9  PLT 176 142*     No results found for: HEPBSAG, HEPBSAB, HEPBIGM    Microbiology:  Recent Results (from the  past 240 hour(s))  MRSA PCR Screening     Status: None   Collection Time: 06/04/18  9:48 PM  Result Value Ref Range Status   MRSA by PCR NEGATIVE NEGATIVE Final    Comment:        The GeneXpert MRSA Assay (FDA approved for NASAL specimens only), is one component of a comprehensive MRSA colonization surveillance program. It is not intended to diagnose MRSA infection nor to guide or monitor treatment for MRSA infections. Performed at [facility], [address] [place], [place] [address]     Coagulation Studies: No results for input(s): LABPROT, INR in the last 72 hours.  Urinalysis: Recent Labs    06/04/18 1659  COLORURINE YELLOW*  LABSPEC 1.016  PHURINE 5.0  GLUCOSEU NEGATIVE  HGBUR NEGATIVE  BILIRUBINUR NEGATIVE  KETONESUR NEGATIVE  PROTEINUR 30*  NITRITE NEGATIVE  LEUKOCYTESUR NEGATIVE      Imaging: Ct Head Wo Contrast  Result Date: 06/04/2018 CLINICAL DATA:  [place] today, head and neck pain, denies loss of consciousness, history hypertension, breast cancer, emphysema, former smoker EXAM: CT HEAD WITHOUT CONTRAST CT CERVICAL SPINE WITHOUT CONTRAST TECHNIQUE: Multidetector CT imaging of the head and cervical spine was performed following the standard protocol without  intravenous contrast. Multiplanar CT image reconstructions of the cervical spine were also generated. COMPARISON:  03/07/2018 CT head, CT cervical spine 07/25/2014 FINDINGS: CT HEAD FINDINGS Brain: Generalized atrophy. Normal ventricular morphology. No midline shift or mass effect. Otherwise normal appearance of brain parenchyma. No intracranial hemorrhage, evidence acute infarction, or extra-axial fluid collection. Calcified LEFT parafalcine mass near the vertex 10 mm greatest diameter likely represents a parafalcine meningioma. No additional mass lesions. Vascular: Unremarkable Skull: Demineralized but intact. Small RIGHT supraorbital scalp hematoma. Sinuses/Orbits: Clear Other: N/A CT CERVICAL SPINE  FINDINGS Alignment: Normal Skull base and vertebrae: Osseous demineralization. Visualized skull base intact. Vertebral body and disc space heights maintained. No fracture, subluxation or bone destruction. Multilevel facet degenerative changes. Soft tissues and spinal canal: Prevertebral soft tissues normal thickness. Disc levels:  No significant abnormalities Upper chest: BILATERAL pleural effusions. Compressive atelectasis of the posterior upper lobes. Biapical scarring. Other: N/A IMPRESSION: Generalized atrophy. No acute intracranial abnormalities. 10 mm calcified LEFT parafalcine meningioma. Degenerative facet disease changes cervical spine. No acute cervical spine abnormalities. Electronically Signed   By: Lavonia Dana M.D.   On: 06/04/2018 15:49   Ct Cervical Spine Wo Contrast  Result Date: 06/04/2018 CLINICAL DATA:  [place] today, head and neck pain, denies loss of consciousness, history hypertension, breast cancer, emphysema, former smoker EXAM: CT HEAD WITHOUT CONTRAST CT CERVICAL SPINE WITHOUT CONTRAST TECHNIQUE: Multidetector CT imaging of the head and cervical spine was performed following the standard protocol without intravenous contrast. Multiplanar CT image reconstructions of the cervical spine were also generated. COMPARISON:  03/07/2018 CT head, CT cervical spine 07/25/2014 FINDINGS: CT HEAD FINDINGS Brain: Generalized atrophy. Normal ventricular morphology. No midline shift or mass effect. Otherwise normal appearance of brain parenchyma. No intracranial hemorrhage, evidence acute infarction, or extra-axial fluid collection. Calcified LEFT parafalcine mass near the vertex 10 mm greatest diameter likely represents a parafalcine meningioma. No additional mass lesions. Vascular: Unremarkable Skull: Demineralized but intact. Small RIGHT supraorbital scalp hematoma. Sinuses/Orbits: Clear Other: N/A CT CERVICAL SPINE FINDINGS Alignment: Normal Skull base and vertebrae: Osseous demineralization. Visualized  skull base intact. Vertebral body and disc space heights maintained. No fracture, subluxation or bone destruction. Multilevel facet degenerative changes. Soft tissues and spinal canal: Prevertebral soft tissues normal thickness. Disc levels:  No significant abnormalities Upper chest: BILATERAL pleural effusions. Compressive atelectasis of the posterior upper lobes. Biapical scarring. Other: N/A IMPRESSION: Generalized atrophy. No acute intracranial abnormalities. 10 mm calcified LEFT parafalcine meningioma. Degenerative facet disease changes cervical spine. No acute cervical spine abnormalities. Electronically Signed   By: Lavonia Dana M.D.   On: 06/04/2018 15:49   US Carotid Bilateral  Result Date: 06/05/2018 CLINICAL DATA:  Syncopal episode. History of hypertension, CAD and smoking. EXAM: BILATERAL CAROTID DUPLEX ULTRASOUND TECHNIQUE: Pearline Cables scale imaging, color Doppler and duplex ultrasound were performed of bilateral carotid and vertebral arteries in the neck. COMPARISON:  None. FINDINGS: Criteria: Quantification of carotid stenosis is based on velocity parameters that correlate the residual internal carotid diameter with NASCET-based stenosis levels, using the diameter of the distal internal carotid lumen as the denominator for stenosis measurement. The following velocity measurements were obtained: RIGHT ICA:  114/39 cm/sec CCA:  967/59 cm/sec SYSTOLIC ICA/CCA RATIO:  1.63 ECA:  89 cm/sec LEFT ICA:  119/48 cm/sec CCA:  846/65 cm/sec SYSTOLIC ICA/CCA RATIO:  1.0 ECA:  92 cm/sec RIGHT CAROTID ARTERY: There is no grayscale evidence of significant intimal thickening or atherosclerotic plaque affecting interrogated portions of the right carotid system. There are no elevated peak systolic  velocities within the interrogated course the right internal carotid artery to suggest a hemodynamically significant stenosis. Borderline elevated peak systolic velocity within the distal aspect the right internal carotid artery is  felt to be factitious Lea elevated due to sampling at a location of tortuosity. RIGHT VERTEBRAL ARTERY:  Antegrade flow LEFT CAROTID ARTERY: There is no grayscale evidence of significant intimal thickening or atherosclerotic plaque affecting interrogated portions of the left carotid system. There are no elevated peak systolic velocities within the interrogated course of the left internal carotid artery to suggest a hemodynamically significant stenosis. Borderline elevated peak systolic velocity within distal aspect the left internal carotid artery is felt to be factitiously elevated due to sampling at a location of tortuosity. LEFT VERTEBRAL ARTERY:  Antegrade flow IMPRESSION: Unremarkable carotid Doppler ultrasound. Electronically Signed   By: Sandi Mariscal M.D.   On: 06/05/2018 08:16   Dg Chest Portable 1 View  Result Date: 06/04/2018 CLINICAL DATA:  Altered mental status. History of left breast carcinoma EXAM: PORTABLE CHEST 1 VIEW COMPARISON:  March 07, 2018 FINDINGS: There is no appreciable edema or consolidation. Heart is mildly enlarged with pulmonary vascularity normal. No adenopathy. There is aortic atherosclerosis. No pneumothorax. No bone lesions. Status post left mastectomy. IMPRESSION: No edema or consolidation. Mild cardiac enlargement. Aortic atherosclerosis. Status post left mastectomy. Aortic Atherosclerosis (ICD10-I70.0). Electronically Signed   By: Lowella Grip III M.D.   On: 06/04/2018 14:57     Medications:    . aspirin EC  81 mg Oral Daily  . calcium-vitamin D   Oral Daily  . carvedilol  25 mg Oral BID WC  . docusate sodium  100 mg Oral BID  . fluticasone furoate-vilanterol  1 puff Inhalation Daily  . heparin  5,000 Units Subcutaneous Q8H  . hydrALAZINE  25 mg Oral BID  . pantoprazole  40 mg Oral Daily  . raloxifene  60 mg Oral Daily  . ramipril  20 mg Oral BH-q7a  . tiotropium  18 mcg Inhalation Daily  . tolvaptan  15 mg Oral Q24H  . vitamin C  500 mg Oral Daily    acetaminophen **OR** acetaminophen, albuterol, bisacodyl, HYDROcodone-acetaminophen, ondansetron **OR** ondansetron (ZOFRAN) IV, temazepam, traZODone  Assessment/ Plan:  84 y.o. female with hypertension, CHF, COPD, GERD, history of breast cancer, was admitted on 06/04/2018 for evaluation  Patient has history of chronic hyponatremia.  On March 08, 2018, sodium was normal at 137.  This time, her admit sodium is 127   1. Chronic hyponatremia Urine studies show osm of 613.  2D echo shows patient has low EF of 45% with diffuse hypokinesis.  Therefore, hyponatremia is likely multifactorial with contributions from CHF and SIADH  TSH is normal at 3.920.Chest x-ray negative for any mass This level of hyponatremia is unlikely to be contributing to patient's falls at home  Plan:  SPEP, UPEP are pending Trial of tolvaptan Avoid IV normal saline Fluid intake guided by thirst.  Will follow     LOS: 0 Austine Wiedeman Candiss Norse 7/7/20199:27 AM  [facility] [place], [place]  Note: This note was prepared with Dragon dictation. Any transcription errors are unintentional

## 2018-06-06 NOTE — Progress Notes (Signed)
SUBJECTIVE: patient still has some dizziness   Vitals:   06/05/18 1639 06/05/18 1938 06/06/18 0355 06/06/18 0801  BP: (!) 146/89 (!) 143/70 139/87 (!) 161/86  Pulse: 67 98 71 67  Resp:  17 18   Temp:  98.3 F (36.8 C) (!) 97.3 F (36.3 C) 97.6 F (36.4 C)  TempSrc:  Oral Oral Oral  SpO2:  95% 90% 95%  Weight:      Height:        Intake/Output Summary (Last 24 hours) at 06/06/2018 1207 Last data filed at 06/06/2018 1016 Gross per 24 hour  Intake 2403.33 ml  Output 300 ml  Net 2103.33 ml    LABS: Basic Metabolic Panel: Recent Labs    06/05/18 0204 06/06/18 0706  NA 128* 126*  K 4.7 4.9  CL 96* 93*  CO2 28 28  GLUCOSE 118* 105*  BUN 12 11  CREATININE 0.57 0.54  CALCIUM 8.2* 8.4*   Liver Function Tests: Recent Labs    06/04/18 1430  AST 36  ALT 39  ALKPHOS 97  BILITOT 0.8  PROT 5.8*  ALBUMIN 3.2*   No results for input(s): LIPASE, AMYLASE in the last 72 hours. CBC: Recent Labs    06/04/18 1430 06/05/18 0204  WBC 4.7 3.2*  NEUTROABS 2.4  --   HGB 13.3 11.2*  HCT 39.1 34.1*  MCV 93.7 95.9  PLT 176 142*   Cardiac Enzymes: Recent Labs    06/04/18 2055 06/05/18 0204 06/05/18 0951  TROPONINI 0.09* 0.04* 0.03*   BNP: Invalid input(s): POCBNP D-Dimer: No results for input(s): DDIMER in the last 72 hours. Hemoglobin A1C: Recent Labs    06/04/18 2055  HGBA1C 5.3   Fasting Lipid Panel: No results for input(s): CHOL, HDL, LDLCALC, TRIG, CHOLHDL, LDLDIRECT in the last 72 hours. Thyroid Function Tests: Recent Labs    06/04/18 2055  TSH 3.920   Anemia Panel: No results for input(s): VITAMINB12, FOLATE, FERRITIN, TIBC, IRON, RETICCTPCT in the last 72 hours.   PHYSICAL EXAM General: Well developed, well nourished, in no acute distress HEENT:  Normocephalic and atramatic Neck:  No JVD.  Lungs: Clear bilaterally to auscultation and percussion. Heart: HRRR . Normal S1 and S2 without gallops or murmurs.  Abdomen: Bowel sounds are positive,  abdomen soft and non-tender  Msk:  Back normal, normal gait. Normal strength and tone for age. Extremities: No clubbing, cyanosis or edema.   Neuro: Alert and oriented X 3. Psych:  Good affect, responds appropriately  TELEMETRY: sinus rhythm  ASSESSMENT AND PLAN: syncope due to hyponatremia/hyperkalemia with possible arrhythmia and presumed coronary artery disease. Left ventricle ejection fraction on echo was 45% and is on maximum medical therapy. Patient has mildly elevated troponin which was explained to the daughter and explained to her that she may have had due to coronary artery disease. The daughter and the family did not want to have any invasive procedures. We will treat the patient conservatively.  Active Problems:   Syncope    Dionisio David, MD, [facility] 06/06/2018 12:07 PM

## 2018-06-07 ENCOUNTER — Observation Stay: Payer: PPO

## 2018-06-07 DIAGNOSIS — I110 Hypertensive heart disease with heart failure: Secondary | ICD-10-CM | POA: Diagnosis present

## 2018-06-07 DIAGNOSIS — Z8249 Family history of ischemic heart disease and other diseases of the circulatory system: Secondary | ICD-10-CM | POA: Diagnosis not present

## 2018-06-07 DIAGNOSIS — F0390 Unspecified dementia without behavioral disturbance: Secondary | ICD-10-CM | POA: Diagnosis present

## 2018-06-07 DIAGNOSIS — J90 Pleural effusion, not elsewhere classified: Secondary | ICD-10-CM | POA: Diagnosis not present

## 2018-06-07 DIAGNOSIS — E875 Hyperkalemia: Secondary | ICD-10-CM | POA: Diagnosis present

## 2018-06-07 DIAGNOSIS — Z87891 Personal history of nicotine dependence: Secondary | ICD-10-CM | POA: Diagnosis not present

## 2018-06-07 DIAGNOSIS — J449 Chronic obstructive pulmonary disease, unspecified: Secondary | ICD-10-CM | POA: Diagnosis present

## 2018-06-07 DIAGNOSIS — I509 Heart failure, unspecified: Secondary | ICD-10-CM | POA: Diagnosis not present

## 2018-06-07 DIAGNOSIS — Z7982 Long term (current) use of aspirin: Secondary | ICD-10-CM | POA: Diagnosis not present

## 2018-06-07 DIAGNOSIS — Z66 Do not resuscitate: Secondary | ICD-10-CM | POA: Diagnosis present

## 2018-06-07 DIAGNOSIS — Z7951 Long term (current) use of inhaled steroids: Secondary | ICD-10-CM | POA: Diagnosis not present

## 2018-06-07 DIAGNOSIS — J9601 Acute respiratory failure with hypoxia: Secondary | ICD-10-CM | POA: Diagnosis present

## 2018-06-07 DIAGNOSIS — Z853 Personal history of malignant neoplasm of breast: Secondary | ICD-10-CM | POA: Diagnosis not present

## 2018-06-07 DIAGNOSIS — Z9012 Acquired absence of left breast and nipple: Secondary | ICD-10-CM | POA: Diagnosis not present

## 2018-06-07 DIAGNOSIS — R296 Repeated falls: Secondary | ICD-10-CM | POA: Diagnosis present

## 2018-06-07 DIAGNOSIS — K219 Gastro-esophageal reflux disease without esophagitis: Secondary | ICD-10-CM | POA: Diagnosis present

## 2018-06-07 DIAGNOSIS — Z90710 Acquired absence of both cervix and uterus: Secondary | ICD-10-CM | POA: Diagnosis not present

## 2018-06-07 DIAGNOSIS — Z79899 Other long term (current) drug therapy: Secondary | ICD-10-CM | POA: Diagnosis not present

## 2018-06-07 DIAGNOSIS — E222 Syndrome of inappropriate secretion of antidiuretic hormone: Secondary | ICD-10-CM | POA: Diagnosis present

## 2018-06-07 DIAGNOSIS — R55 Syncope and collapse: Secondary | ICD-10-CM | POA: Diagnosis present

## 2018-06-07 DIAGNOSIS — I739 Peripheral vascular disease, unspecified: Secondary | ICD-10-CM | POA: Diagnosis present

## 2018-06-07 DIAGNOSIS — I5022 Chronic systolic (congestive) heart failure: Secondary | ICD-10-CM | POA: Diagnosis present

## 2018-06-07 LAB — SODIUM: Sodium: 133 mmol/L — ABNORMAL LOW (ref 135–145)

## 2018-06-07 LAB — GLUCOSE, CAPILLARY: Glucose-Capillary: 101 mg/dL — ABNORMAL HIGH (ref 70–99)

## 2018-06-07 MED ORDER — HYDRALAZINE HCL 25 MG PO TABS
25.0000 mg | ORAL_TABLET | Freq: Three times a day (TID) | ORAL | Status: DC
  Administered 2018-06-07 – 2018-06-09 (×7): 25 mg via ORAL
  Filled 2018-06-07 (×7): qty 1

## 2018-06-07 MED ORDER — TRAZODONE HCL 50 MG PO TABS
50.0000 mg | ORAL_TABLET | Freq: Every day | ORAL | Status: DC
  Administered 2018-06-07 – 2018-06-08 (×2): 50 mg via ORAL
  Filled 2018-06-07 (×2): qty 1

## 2018-06-07 MED ORDER — FUROSEMIDE 10 MG/ML IJ SOLN
40.0000 mg | Freq: Once | INTRAMUSCULAR | Status: AC
  Administered 2018-06-07: 40 mg via INTRAVENOUS
  Filled 2018-06-07: qty 4

## 2018-06-07 NOTE — Progress Notes (Signed)
Physical Therapy Treatment Patient Details Name: Melanie Huffman MRN: [id number] DOB: 1928-03-28 Today's Date: 06/07/2018    History of Present Illness Patient is an 84 y/o female that presents with syncopal episode and multiple recent falls. Noted to be severely hyponatremic and slightly hyperkalemic.     PT Comments    Patient up in bed at start of session with nursing and friend at bedside, mentions minimal pain in neck, agreeable to therapy. O2 assessment performed, see below for details. Patient demonstrated bed mobility with supervision, and transfers with CGA. Patient had LOB with first sit to stand, with PT mod Ax1 to correct. Sit to stand x2 patient used RW and CGA, much improved balance. Patient ambulated in hallway with RW, CGA and [place] at 3L for 260ft without complaint. Occasional verbal cues for pacing activity, and verbal/visual cues for pursed lip breathing. Patient up in chair with all needs in reach at end of session. The patient would benefit from further skilled PT to continue to address changes from PLOF.   SaO2 on room air at rest = 85% SaO2 on room air while ambulating = NA SaO2 on 3 liters of O2 while ambulating = 89-92%      Follow Up Recommendations  Home health PT     Equipment Recommendations  Rolling walker with 5" wheels    Recommendations for Other Services       Precautions / Restrictions      Mobility  Bed Mobility Overal bed mobility: Needs Assistance Bed Mobility: Supine to Sit     Supine to sit: Supervision        Transfers Overall transfer level: Needs assistance Equipment used: Rolling walker (2 wheeled) Transfers: Sit to/from Stand Sit to Stand: Min guard            Ambulation/Gait Ambulation/Gait assistance: Min guard Gait Distance (Feet): 240 Feet Assistive device: Rolling walker (2 wheeled) Gait Pattern/deviations: WFL(Within Functional Limits)     General Gait Details: Patient needed cues for safe walking pace, pursed  lip breathing, 1 standing rest break. [place] at 3L >88%   Stairs             Wheelchair Mobility    Modified Rankin (Stroke Patients Only)       Balance Overall balance assessment: Needs assistance;History of Falls Sitting-balance support: Feet supported Sitting balance-Leahy Scale: Fair     Standing balance support: Bilateral upper extremity supported Standing balance-Leahy Scale: Poor                              Cognition                                              Exercises Other Exercises Other Exercises: Patient educated and performed pursed lip breathing technique, encouraged to perform and practice as much as possible.     General Comments General comments (skin integrity, edema, etc.): R wrist brace in place, patient agreeable to therapy      Pertinent Vitals/Pain Pain Assessment: Faces Faces Pain Scale: Hurts a little bit Pain Location: neck pain Pain Descriptors / Indicators: Aching Pain Intervention(s): Repositioned    Home Living                      Prior Function  PT Goals (current goals can now be found in the care plan section) Progress towards PT goals: Progressing toward goals    Frequency    Min 2X/week      PT Plan Current plan remains appropriate    Co-evaluation              AM-PAC PT "6 Clicks" Daily Activity  Outcome Measure  Difficulty turning over in bed (including adjusting bedclothes, sheets and blankets)?: A Little Difficulty moving from lying on back to sitting on the side of the bed? : A Little Difficulty sitting down on and standing up from a chair with arms (e.g., wheelchair, bedside commode, etc,.)?: A Little Help needed moving to and from a bed to chair (including a wheelchair)?: A Little Help needed walking in hospital room?: A Little Help needed climbing 3-5 steps with a railing? : A Little 6 Click Score: 18    End of Session Equipment Utilized  During Treatment: Gait belt Activity Tolerance: Patient tolerated treatment well Patient left: with call bell/phone within reach;in chair;with chair alarm set;with family/visitor present Nurse Communication: Mobility status PT Visit Diagnosis: Muscle weakness (generalized) (M62.81);Repeated falls (R29.6)     Time: 5974-1638 PT Time Calculation (min) (ACUTE ONLY): 23 min  Charges:  $Gait Training: 8-22 mins $Therapeutic Activity: 8-22 mins                    G Codes:      Lieutenant Diego PT, DPT 4:37 PM,06/07/18 [phone]

## 2018-06-07 NOTE — Progress Notes (Signed)
Chaplain followed up with patient from chaplain. Daughter and pastor was at the bedside. Chaplain let patient know chaplains are available when needed.    06/07/18 1100  Clinical Encounter Type  Visited With Patient and family together  Visit Type Follow-up  Referral From Chaplain  Spiritual Encounters  Spiritual Needs Emotional

## 2018-06-07 NOTE — Progress Notes (Signed)
[facility] Encounter Note  Patient: Melanie Huffman / Admit Date: 06/04/2018 / Date of Encounter: 06/07/2018, 6:55 AM   Subjective: Appears still to be somewhat weak at this time and unable to ambulate well.  Patient has had no evidence of further dizziness and/or syncope.  Electrolyte abnormalities including hyponatremia and hyperkalemia improved.  Telemetry shows normal sinus rhythm with pre-atrial contractions no evidence of significant rhythm disturbances causing syncope.  No current evidence of chest pain or congestive heart failure Echocardiogram showing mild global LV systolic dysfunction  Review of Systems: Positive for: Weakness and fatigue Negative for: Vision change, hearing change, syncope, dizziness, nausea, vomiting,diarrhea, bloody stool, stomach pain, cough, congestion, diaphoresis, urinary frequency, urinary pain,skin lesions, skin rashes Others previously listed  Objective: Telemetry: Sinus rhythm with pre-atrial contraction Physical Exam: Blood pressure (!) 172/81, pulse 80, temperature 98.1 F (36.7 C), resp. rate 18, height 5\' 5"  (1.651 m), weight 135 lb 14.4 oz (61.6 kg), SpO2 95 %. Body mass index is 22.61 kg/m. General: Well developed, well nourished, in no acute distress. Head: Normocephalic, atraumatic, sclera non-icteric, no xanthomas, nares are without discharge. Neck: No apparent masses Lungs: Normal respirations with no wheezes, no rhonchi, no rales , no crackles   Heart: Regular rate and rhythm, normal S1 S2, no murmur, no rub, no gallop, PMI is normal size and placement, carotid upstroke normal without bruit, jugular venous pressure normal Abdomen: Soft, non-tender, non-distended with normoactive bowel sounds. No hepatosplenomegaly. Abdominal aorta is normal size without bruit Extremities: Trace edema, no clubbing, no cyanosis, no ulcers,  Peripheral: 2+ radial, 2+ femoral, 2+ dorsal pedal pulses Neuro: Alert and oriented. Moves all  extremities spontaneously. Psych:  Responds to questions appropriately with a normal affect.   Intake/Output Summary (Last 24 hours) at 06/07/2018 0655 Last data filed at 06/07/2018 0518 Gross per 24 hour  Intake 1200 ml  Output 3400 ml  Net -2200 ml    Inpatient Medications:  . aspirin EC  81 mg Oral Daily  . calcium-vitamin D   Oral Daily  . carvedilol  25 mg Oral BID WC  . docusate sodium  100 mg Oral BID  . fluticasone furoate-vilanterol  1 puff Inhalation Daily  . heparin  5,000 Units Subcutaneous Q8H  . hydrALAZINE  25 mg Oral BID  . pantoprazole  40 mg Oral Daily  . raloxifene  60 mg Oral Daily  . ramipril  20 mg Oral BH-q7a  . tiotropium  18 mcg Inhalation Daily  . vitamin C  500 mg Oral Daily   Infusions:   Labs: Recent Labs    06/05/18 0204 06/06/18 0706 06/06/18 1709 06/07/18 0125  NA 128* 126* 130* 133*  K 4.7 4.9  --   --   CL 96* 93*  --   --   CO2 28 28  --   --   GLUCOSE 118* 105*  --   --   BUN 12 11  --   --   CREATININE 0.57 0.54  --   --   CALCIUM 8.2* 8.4*  --   --    Recent Labs    06/04/18 1430  AST 36  ALT 39  ALKPHOS 97  BILITOT 0.8  PROT 5.8*  ALBUMIN 3.2*   Recent Labs    06/04/18 1430 06/05/18 0204  WBC 4.7 3.2*  NEUTROABS 2.4  --   HGB 13.3 11.2*  HCT 39.1 34.1*  MCV 93.7 95.9  PLT 176 142*   Recent Labs  06/04/18 1430 06/04/18 2055 06/05/18 0204 06/05/18 0951  TROPONINI <0.03 0.09* 0.04* 0.03*   Invalid input(s): POCBNP Recent Labs    06/04/18 2055  HGBA1C 5.3     Weights: Filed Weights   06/04/18 2028 06/05/18 0406 06/07/18 0356  Weight: 136 lb (61.7 kg) 136 lb (61.7 kg) 135 lb 14.4 oz (61.6 kg)     Radiology/Studies:  Ct Head Wo Contrast  Result Date: 06/04/2018 CLINICAL DATA:  [place] today, head and neck pain, denies loss of consciousness, history hypertension, breast cancer, emphysema, former smoker EXAM: CT HEAD WITHOUT CONTRAST CT CERVICAL SPINE WITHOUT CONTRAST TECHNIQUE: Multidetector CT imaging  of the head and cervical spine was performed following the standard protocol without intravenous contrast. Multiplanar CT image reconstructions of the cervical spine were also generated. COMPARISON:  03/07/2018 CT head, CT cervical spine 07/25/2014 FINDINGS: CT HEAD FINDINGS Brain: Generalized atrophy. Normal ventricular morphology. No midline shift or mass effect. Otherwise normal appearance of brain parenchyma. No intracranial hemorrhage, evidence acute infarction, or extra-axial fluid collection. Calcified LEFT parafalcine mass near the vertex 10 mm greatest diameter likely represents a parafalcine meningioma. No additional mass lesions. Vascular: Unremarkable Skull: Demineralized but intact. Small RIGHT supraorbital scalp hematoma. Sinuses/Orbits: Clear Other: N/A CT CERVICAL SPINE FINDINGS Alignment: Normal Skull base and vertebrae: Osseous demineralization. Visualized skull base intact. Vertebral body and disc space heights maintained. No fracture, subluxation or bone destruction. Multilevel facet degenerative changes. Soft tissues and spinal canal: Prevertebral soft tissues normal thickness. Disc levels:  No significant abnormalities Upper chest: BILATERAL pleural effusions. Compressive atelectasis of the posterior upper lobes. Biapical scarring. Other: N/A IMPRESSION: Generalized atrophy. No acute intracranial abnormalities. 10 mm calcified LEFT parafalcine meningioma. Degenerative facet disease changes cervical spine. No acute cervical spine abnormalities. Electronically Signed   By: Lavonia Dana M.D.   On: 06/04/2018 15:49   Ct Cervical Spine Wo Contrast  Result Date: 06/04/2018 CLINICAL DATA:  [place] today, head and neck pain, denies loss of consciousness, history hypertension, breast cancer, emphysema, former smoker EXAM: CT HEAD WITHOUT CONTRAST CT CERVICAL SPINE WITHOUT CONTRAST TECHNIQUE: Multidetector CT imaging of the head and cervical spine was performed following the standard protocol without  intravenous contrast. Multiplanar CT image reconstructions of the cervical spine were also generated. COMPARISON:  03/07/2018 CT head, CT cervical spine 07/25/2014 FINDINGS: CT HEAD FINDINGS Brain: Generalized atrophy. Normal ventricular morphology. No midline shift or mass effect. Otherwise normal appearance of brain parenchyma. No intracranial hemorrhage, evidence acute infarction, or extra-axial fluid collection. Calcified LEFT parafalcine mass near the vertex 10 mm greatest diameter likely represents a parafalcine meningioma. No additional mass lesions. Vascular: Unremarkable Skull: Demineralized but intact. Small RIGHT supraorbital scalp hematoma. Sinuses/Orbits: Clear Other: N/A CT CERVICAL SPINE FINDINGS Alignment: Normal Skull base and vertebrae: Osseous demineralization. Visualized skull base intact. Vertebral body and disc space heights maintained. No fracture, subluxation or bone destruction. Multilevel facet degenerative changes. Soft tissues and spinal canal: Prevertebral soft tissues normal thickness. Disc levels:  No significant abnormalities Upper chest: BILATERAL pleural effusions. Compressive atelectasis of the posterior upper lobes. Biapical scarring. Other: N/A IMPRESSION: Generalized atrophy. No acute intracranial abnormalities. 10 mm calcified LEFT parafalcine meningioma. Degenerative facet disease changes cervical spine. No acute cervical spine abnormalities. Electronically Signed   By: Lavonia Dana M.D.   On: 06/04/2018 15:49   US Carotid Bilateral  Result Date: 06/05/2018 CLINICAL DATA:  Syncopal episode. History of hypertension, CAD and smoking. EXAM: BILATERAL CAROTID DUPLEX ULTRASOUND TECHNIQUE: Pearline Cables scale imaging, color Doppler and duplex ultrasound were  performed of bilateral carotid and vertebral arteries in the neck. COMPARISON:  None. FINDINGS: Criteria: Quantification of carotid stenosis is based on velocity parameters that correlate the residual internal carotid diameter with  NASCET-based stenosis levels, using the diameter of the distal internal carotid lumen as the denominator for stenosis measurement. The following velocity measurements were obtained: RIGHT ICA:  114/39 cm/sec CCA:  161/09 cm/sec SYSTOLIC ICA/CCA RATIO:  6.04 ECA:  89 cm/sec LEFT ICA:  119/48 cm/sec CCA:  540/98 cm/sec SYSTOLIC ICA/CCA RATIO:  1.0 ECA:  92 cm/sec RIGHT CAROTID ARTERY: There is no grayscale evidence of significant intimal thickening or atherosclerotic plaque affecting interrogated portions of the right carotid system. There are no elevated peak systolic velocities within the interrogated course the right internal carotid artery to suggest a hemodynamically significant stenosis. Borderline elevated peak systolic velocity within the distal aspect the right internal carotid artery is felt to be factitious [place] elevated due to sampling at a location of tortuosity. RIGHT VERTEBRAL ARTERY:  Antegrade flow LEFT CAROTID ARTERY: There is no grayscale evidence of significant intimal thickening or atherosclerotic plaque affecting interrogated portions of the left carotid system. There are no elevated peak systolic velocities within the interrogated course of the left internal carotid artery to suggest a hemodynamically significant stenosis. Borderline elevated peak systolic velocity within distal aspect the left internal carotid artery is felt to be factitiously elevated due to sampling at a location of tortuosity. LEFT VERTEBRAL ARTERY:  Antegrade flow IMPRESSION: Unremarkable carotid Doppler ultrasound. Electronically Signed   By: Sandi Mariscal M.D.   On: 06/05/2018 08:16   Dg Chest Portable 1 View  Result Date: 06/04/2018 CLINICAL DATA:  Altered mental status. History of left breast carcinoma EXAM: PORTABLE CHEST 1 VIEW COMPARISON:  March 07, 2018 FINDINGS: There is no appreciable edema or consolidation. Heart is mildly enlarged with pulmonary vascularity normal. No adenopathy. There is aortic atherosclerosis.  No pneumothorax. No bone lesions. Status post left mastectomy. IMPRESSION: No edema or consolidation. Mild cardiac enlargement. Aortic atherosclerosis. Status post left mastectomy. Aortic Atherosclerosis (ICD10-I70.0). Electronically Signed   By: Lowella Grip III M.D.   On: 06/04/2018 14:57     Assessment and Recommendation  84 y.o. female with episode of syncope likely secondary to hyponatremia hyperkalemia and or weakness and fatigue due to advanced age SIADH.  No evidence of hypotension or rhythm disturbances at this time as primary cause. 1.  Continue treatment for SIADH and concerns for hyponatremia as well as possible volume depletion 2.  Carvedilol ram approval for hypertension control and treatment of mild LV systolic dysfunction 3.  No further cardiac diagnostics necessary at this time 4.  Continue rehabilitation without restriction at this time following for significant weakness and follow for improvements and adjustments of medication management as necessary Signed, Serafina Royals M.D. FACC

## 2018-06-07 NOTE — Care Management Note (Signed)
Case Management Note  Patient Details  Name: DOREATHER HOXWORTH MRN: [id number] Date of Birth: 1928-07-30  Subjective/Objective:                 Patient resides at [facility] independent living level of care on the grounds of [place].  Patient says she does not have home oxygen.  This CM recalls during a previous hospitalization that patient qualified for home oxygen during the stay but on day of discharge, patient did not require continuous oxygen.  Home health services were set up with [facility] but when agency attempted to make initial visit, the patient declined service.   It is reported during progression that patient's daughter has verbalized concerns as to whether patient is able to continue living on her own.    Action/Plan: Left voicemail message for daughter Donofrio to discuss discharge disposition.  Discussed during progression of need the wean oxygen and perform home oxygen assessment.  Expected Discharge Date:                  Expected Discharge Plan:     In-House Referral:     Discharge planning Services     Post Acute Care Choice:    Choice offered to:     DME Arranged:    DME Agency:     HH Arranged:    HH Agency:     Status of Service:     If discussed at [employer], dates discussed:    Additional Comments:  Katrina Stack, RN 06/07/2018, 12:24 PM

## 2018-06-07 NOTE — Care Management (Signed)
Notified daughter that [facility] outpatient therapist can not provide occupational therapy.  Melanie Huffman is not sure of the need for home health nurse but wishes to pursue physical therapy and occupational therapy.  Melanie Huffman is arranging for round the clock in home caregiver through [facility] [phone]. No agency preference.  Referral for PT OT called to [facility] with heads up for possible nursing if daughter agrees.

## 2018-06-07 NOTE — Progress Notes (Signed)
Patient ID: Melanie Huffman, female   DOB: 26-Apr-1928, 84 y.o.   MRN: [id number]   [facility] PROGRESS NOTE  Melanie Huffman KDX:[id number] DOB: Dec 05, 1927 DOA: 06/04/2018 PCP: Dion Body, MD  HPI/Subjective: Patient feeling okay.  Some shortness of breath.  Had increasing oxygen requirements overnight and was placed up to 5 L but currently down to 2 L this morning.  Objective: Vitals:   06/07/18 0517 06/07/18 0746  BP:  (!) 161/93  Pulse:  75  Resp:  20  Temp:  98.4 F (36.9 C)  SpO2: 95% 91%    Filed Weights   06/04/18 2028 06/05/18 0406 06/07/18 0356  Weight: 61.7 kg (136 lb) 61.7 kg (136 lb) 61.6 kg (135 lb 14.4 oz)    ROS: Review of Systems  Constitutional: Negative for chills and fever.  Eyes: Negative for blurred vision.  Respiratory: Positive for shortness of breath. Negative for cough.   Cardiovascular: Negative for chest pain.  Gastrointestinal: Negative for abdominal pain, constipation, diarrhea, nausea and vomiting.  Genitourinary: Negative for dysuria.  Musculoskeletal: Negative for joint pain.  Neurological: Negative for dizziness and headaches.   Exam: Physical Exam  Constitutional: She is oriented to person, place, and time.  HENT:  Nose: No mucosal edema.  Mouth/Throat: No oropharyngeal exudate or posterior oropharyngeal edema.  Eyes: Pupils are equal, round, and reactive to light. Conjunctivae, EOM and lids are normal.  Neck: No JVD present. Carotid bruit is not present. No edema present. No thyroid mass and no thyromegaly present.  Cardiovascular: S1 normal and S2 normal. Exam reveals no gallop.  No murmur heard. Pulses:      Dorsalis pedis pulses are 2+ on the right side, and 2+ on the left side.  Respiratory: No respiratory distress. She has no wheezes. She has no rhonchi. She has no rales.  GI: Soft. Bowel sounds are normal. There is no tenderness.  Musculoskeletal:       Right ankle: She exhibits no swelling.       Left ankle: She  exhibits no swelling.  Lymphadenopathy:    She has no cervical adenopathy.  Neurological: She is alert and oriented to person, place, and time. No cranial nerve deficit.  Skin: Skin is warm. Nails show no clubbing.  Bruise right forehead.  Chronic lower extremity discoloration.  Psychiatric: She has a normal mood and affect.      Data Reviewed: Basic Metabolic Panel: Recent Labs  Lab 06/04/18 1430 06/05/18 0204 06/06/18 0706 06/06/18 1709 06/07/18 0125  NA 127* 128* 126* 130* 133*  K 5.4* 4.7 4.9  --   --   CL 94* 96* 93*  --   --   CO2 24 28 28   --   --   GLUCOSE 158* 118* 105*  --   --   BUN 12 12 11   --   --   CREATININE 0.71 0.57 0.54  --   --   CALCIUM 8.5* 8.2* 8.4*  --   --    Liver Function Tests: Recent Labs  Lab 06/04/18 1430  AST 36  ALT 39  ALKPHOS 97  BILITOT 0.8  PROT 5.8*  ALBUMIN 3.2*   CBC: Recent Labs  Lab 06/04/18 1430 06/05/18 0204  WBC 4.7 3.2*  NEUTROABS 2.4  --   HGB 13.3 11.2*  HCT 39.1 34.1*  MCV 93.7 95.9  PLT 176 142*   Cardiac Enzymes: Recent Labs  Lab 06/04/18 1430 06/04/18 2055 06/05/18 0204 06/05/18 0951  TROPONINI <0.03 0.09* 0.04*  0.03*    CBG: Recent Labs  Lab 06/05/18 0809 06/06/18 0754 06/07/18 0746  GLUCAP 95 111* 101*    Recent Results (from the past 240 hour(s))  MRSA PCR Screening     Status: None   Collection Time: 06/04/18  9:48 PM  Result Value Ref Range Status   MRSA by PCR NEGATIVE NEGATIVE Final    Comment:        The GeneXpert MRSA Assay (FDA approved for NASAL specimens only), is one component of a comprehensive MRSA colonization surveillance program. It is not intended to diagnose MRSA infection nor to guide or monitor treatment for MRSA infections. Performed at [facility], [address] [place], [place] [address]      Studies: Dg Chest 2 View  Result Date: 06/07/2018 CLINICAL DATA:  84 year old female with a history of hypoxia EXAM: CHEST - 2 VIEW COMPARISON:   06/04/2018, 03/07/2018 FINDINGS: Cardiomediastinal silhouette unchanged with cardiomegaly. Interlobular septal thickening with blunting at the costophrenic angles. Meniscus on the lateral view at the costophrenic sulcus. Patchy opacities at the lung bases. No pneumothorax. No acute displaced fracture. Interval removal of the defibrillator pads. IMPRESSION: Chest x-ray appears worse than the comparison, with evidence of CHF and bilateral small pleural effusions. Electronically Signed   By: Corrie Mckusick D.O.   On: 06/07/2018 08:24    Scheduled Meds: . aspirin EC  81 mg Oral Daily  . calcium-vitamin D   Oral Daily  . carvedilol  25 mg Oral BID WC  . docusate sodium  100 mg Oral BID  . fluticasone furoate-vilanterol  1 puff Inhalation Daily  . heparin  5,000 Units Subcutaneous Q8H  . hydrALAZINE  25 mg Oral Q8H  . pantoprazole  40 mg Oral Daily  . raloxifene  60 mg Oral Daily  . ramipril  20 mg Oral BH-q7a  . tiotropium  18 mcg Inhalation Daily  . traZODone  50 mg Oral QHS  . vitamin C  500 mg Oral Daily    Assessment/Plan:  1. Fluid overload with acute hypoxic respiratory failure.  Patient received IV fluids during the hospital course.  This was discontinued and the patient received a dose of tolvaptan.  Sodium is better so I will restart IV Lasix at this time.  Hopefully can get off oxygen prior to disposition.   2. Syncope.  Patient not orthostatic.  So far no arrhythmias on telemetry.  Physical therapy evaluation appreciated.  Echocardiogram showed slightly reduced ejection fraction. 3. Hyponatremia.  Patient has SIADH.  Nephrology ordered a dose of tolvaptan.  Sodium increased to 133. 4. Hyperkalemia.  Stopped potassium replacement.  May need to change ramipril if potassium still become elevated. 5. Essential hypertension.  Blood pressure low on presentation.  Starting to trend higher with me stopping dose of clonidine.    Increase hydralazine to 3 times daily dosing. 6. Recurrent falls.   Physical therapy evaluation appreciated.  Physical therapy recommended home with home health. 7. GERD on PPI 8. Chronic mid range congestive heart failure.  Lasix restarted with fluid overload with IV fluids.  Code Status:     Code Status Orders  (From admission, onward)        Start     Ordered   06/04/18 2000  Full code  Continuous     06/04/18 1959    Code Status History    Date Active Date Inactive Code Status Order ID Comments User Context   06/04/2018 1832 06/04/2018 1959 DNR [id number]  Max Sane,  MD ED   03/08/2018 0008 03/09/2018 1733 Full Code [id number]  Lance Coon, MD Inpatient   10/31/2016 0237 11/04/2016 2008 Full Code [id number]  Lance Coon, MD Inpatient   07/09/2016 1558 07/10/2016 1914 Full Code [id number]  Henreitta Leber, MD Inpatient   04/12/2015 1949 04/16/2015 1818 Full Code [id number]  Hessie Knows, MD Inpatient   04/11/2015 1252 04/12/2015 1949 Full Code [id number]  Hessie Knows, MD Inpatient   07/26/2014 0445 07/26/2014 1934 Full Code [id number]  Ashok Pall, MD Inpatient    Advance Directive Documentation     Most Recent Value  Type of Advance Directive  Out of facility DNR (pink MOST or yellow form)  Pre-existing out of facility DNR order (yellow form or pink MOST form)  Physician notified to receive inpatient order  "MOST" Form in Place?  -     Family Communication: spoke with daughter at the bedside today.  Her phone number is [phone] Disposition Plan:  try to get off oxygen prior to disposition.  IV Lasix today and reassess tomorrow.    Consultants:  Cardiology  Nephrology  Time spent: 28 minutes.  Spoke with daughter at the bedside.  Case also discussed with nephrology.  Tiffiny Worthy [employer]

## 2018-06-07 NOTE — Care Management (Signed)
Patient 's 02 sat at rest on room air 85%.  She will also require rolling walker.  Says she gave hers away.  Heads up to [facility] for DME.

## 2018-06-07 NOTE — Evaluation (Signed)
Occupational Therapy Evaluation Patient Details Name: Melanie Huffman MRN: [id number] DOB: [birth year] Today's Date: 06/07/2018    History of Present Illness Patient is an 84 y/o female that presents with syncopal episode and multiple recent falls. Noted to be severely hyponatremic and slightly hyperkalemic.    Clinical Impression   Pt seen for OT evaluation this date. Prior to hospital admission, pt was living alone, recently started using a SPC due to recent fall with R wrist fx (in wrist support brace) and L knee fx (no surgery, no brace). Pt receives assist from a PCA in am and pm for IADL tasks and occasionally for dressing. Pt's dtr notes pt is weaker than she was even 1 week prior to this admission. Pt hasn't driven since the fall resulting in the wrist/knee issues. Currently pt demonstrates impairments in pain, cardiopulmonary status (currently on 3L O2 at 93-94%, no home O2 at baseline), strength, activity tolerance, balance, and RUE functional use, requiring supervision to min assist for seated LB ADL and CGA for mobility. Pt/dtr educated in energy conservation strategies and fall sprevention strategies to maximize safety/independence. Would benefit from additional training to support recall and carryover into daily routines at home. Pt would benefit from skilled OT to address noted impairments and functional limitations (see below for any additional details) in order to maximize safety and independence while minimizing falls risk and caregiver burden.  Upon hospital discharge, recommend pt discharge to home with [facility] services to address falls, energy conservation, R wrist functional use, AE/DME needs, increase strength/activity tolerance for ADL/mobility.     Follow Up Recommendations  Home health OT;Supervision - Intermittent    Equipment Recommendations  Other (comment)(TBD)    Recommendations for Other Services       Precautions / Restrictions Precautions Precautions:  Fall Restrictions Weight Bearing Restrictions: No      Mobility Bed Mobility Overal bed mobility: Needs Assistance Bed Mobility: Supine to Sit;Sit to Supine     Supine to sit: Min guard;Min assist;HOB elevated Sit to supine: Min guard;Min assist      Transfers Overall transfer level: Needs assistance Equipment used: 1 person hand held assist Transfers: Sit to/from Stand Sit to Stand: Min guard;Min assist              Balance Overall balance assessment: Needs assistance;History of Falls Sitting-balance support: Feet supported Sitting balance-Leahy Scale: Good     Standing balance support: Single extremity supported Standing balance-Leahy Scale: Fair                             ADL either performed or assessed with clinical judgement   ADL Overall ADL's : Needs assistance/impaired Eating/Feeding: Sitting;Independent   Grooming: Sitting;Independent   Upper Body Bathing: Sitting;Set up;Supervision/ safety;Min guard   Lower Body Bathing: Sit to/from stand;Minimal assistance   Upper Body Dressing : Sitting;Supervision/safety;Set up   Lower Body Dressing: Sit to/from stand;Minimal assistance                       Vision Baseline Vision/History: Wears glasses Wears Glasses: At all times Patient Visual Report: No change from baseline       Perception     Praxis      Pertinent Vitals/Pain Pain Assessment: 0-10 Pain Score: 2  Pain Location: "2.5" head/neck pain Pain Descriptors / Indicators: Aching Pain Intervention(s): Limited activity within patient's tolerance;Monitored during session;Premedicated before session;Repositioned     Hand Dominance Right  Extremity/Trunk Assessment Upper Extremity Assessment Upper Extremity Assessment: Generalized weakness;RUE deficits/detail RUE Deficits / Details: Pt with R wrist fx approx 4 weeks ago, per pt no weightbearing precautions, has support brace for wrist   Lower Extremity  Assessment Lower Extremity Assessment: Defer to PT evaluation;Generalized weakness;LLE deficits/detail LLE Deficits / Details: Per patient, mild L knee pain with ambulation 2/2 recent injury        Communication Communication Communication: HOH   Cognition Arousal/Alertness: Awake/alert Behavior During Therapy: WFL for tasks assessed/performed Overall Cognitive Status: Within Functional Limits for tasks assessed                                 General Comments: grossly WFL, follows commands, alert and oriented x4   General Comments  R wrist brace noted to be malpositioned. Readjusted with improved fit and comfort. RUE elevated to help with mild edema.    Exercises Other Exercises Other Exercises: Pt/dtr educated in energy conservation strategies and fall sprevention strategies to maximize safety/independence. Would benefit from additional training to support recall and carryover into daily routines at home.   Shoulder Instructions      Home Living Family/patient expects to be discharged to:: Other (Comment)(ILF townhome) Living Arrangements: Alone                           Home Equipment: Cane - single point;Shower seat;Bedside commode   Additional Comments: uses BSC over toilet      Prior Functioning/Environment Level of Independence: Needs assistance  Gait / Transfers Assistance Needed: Per patient she has just recently started using a SPC.  ADL's / Homemaking Assistance Needed: Per pt/dtr, PCA came for a couple hours in the am and in the pm to assist pt with IADL tasks (cooking, cleaning, PRN dressing); PCA/dtr have recently started assisting with medication mgt. Hasn't been driving since recent injury to L knee and R wrist. (R wrist in support brace)   Comments: Pt/dtr endorse multiple falls (less than 10) in the past 12 mo.        OT Problem List: Decreased strength;Decreased knowledge of use of DME or AE;Increased edema;Decreased range of  motion;Decreased activity tolerance;Cardiopulmonary status limiting activity;Impaired UE functional use;Pain;Impaired balance (sitting and/or standing)      OT Treatment/Interventions: Self-care/ADL training;Balance training;Therapeutic exercise;Therapeutic activities;DME and/or AE instruction;Energy conservation;Patient/family education    OT Goals(Current goals can be found in the care plan section) Acute Rehab OT Goals Patient Stated Goal: To return home safely and return to PLOF OT Goal Formulation: With patient/family Time For Goal Achievement: 06/21/18 Potential to Achieve Goals: Good ADL Goals Pt Will Perform Lower Body Dressing: with adaptive equipment;sit to/from stand;with supervision Additional ADL Goal #1: Pt will verbalize plan to implement at least 1 learned falls prevention strategy to minimize risk of falls and maximize safety/independence. Additional ADL Goal #2: Pt will verbalize plan to implement at least 1 learned energy conservation strategy to maximize safety/independence.  OT Frequency: Min 2X/week   Barriers to D/C:            Co-evaluation              AM-PAC PT "6 Clicks" Daily Activity     Outcome Measure Help from another person eating meals?: None Help from another person taking care of personal grooming?: None Help from another person toileting, which includes using toliet, bedpan, or urinal?: A Little Help from another  person bathing (including washing, rinsing, drying)?: A Little Help from another person to put on and taking off regular upper body clothing?: A Little Help from another person to put on and taking off regular lower body clothing?: A Little 6 Click Score: 20   End of Session    Activity Tolerance: Patient tolerated treatment well Patient left: in bed;with call bell/phone within reach;with bed alarm set;with family/visitor present  OT Visit Diagnosis: Other abnormalities of gait and mobility (R26.89);Repeated falls (R29.6);Muscle  weakness (generalized) (M62.81);Pain Pain - Right/Left: Right Pain - part of body: Hand                Time: 1101-1133 OT Time Calculation (min): 32 min Charges:  OT General Charges $OT Visit: 1 Visit OT Evaluation $OT Eval Low Complexity: 1 Low OT Treatments $Self Care/Home Management : 8-22 mins  Jeni Salles, MPH, MS, OTR/L ascom [phone] 06/07/18, 12:02 PM

## 2018-06-07 NOTE — Progress Notes (Signed)
[facility], [place] 06/07/18  Subjective:  Serum sodium up to 133 today. Still having some shortness of breath.   Objective:  Vital signs in last 24 hours:  Temp:  [97.5 F (36.4 C)-98.4 F (36.9 C)] 98.4 F (36.9 C) (07/08 0746) Pulse Rate:  [69-124] 75 (07/08 0746) Resp:  [18-20] 20 (07/08 0746) BP: (137-172)/(70-93) 161/93 (07/08 0746) SpO2:  [77 %-96 %] 91 % (07/08 0746) Weight:  [61.6 kg (135 lb 14.4 oz)] 61.6 kg (135 lb 14.4 oz) (07/08 0356)  Weight change:  Filed Weights   06/04/18 2028 06/05/18 0406 06/07/18 0356  Weight: 61.7 kg (136 lb) 61.7 kg (136 lb) 61.6 kg (135 lb 14.4 oz)    Intake/Output:    Intake/Output Summary (Last 24 hours) at 06/07/2018 1055 Last data filed at 06/07/2018 0947 Gross per 24 hour  Intake 1080 ml  Output 3600 ml  Net -2520 ml     Physical Exam: General:  Laying in the bed, no acute distress  HEENT  anicteric, moist oral mucous membranes  Neck  supple   Pulm/lungs  mild basilar crackles, normal breathing effort, O2 by [place]  CVS/Heart  no rub or gallop, S1S2  Abdomen:   Soft, nontender  Extremities:  No edema   Neurologic:  alert, oriented x 3, follows commands             Basic Metabolic Panel:  Recent Labs  Lab 06/04/18 1430 06/05/18 0204 06/06/18 0706 06/06/18 1709 06/07/18 0125  NA 127* 128* 126* 130* 133*  K 5.4* 4.7 4.9  --   --   CL 94* 96* 93*  --   --   CO2 24 28 28   --   --   GLUCOSE 158* 118* 105*  --   --   BUN 12 12 11   --   --   CREATININE 0.71 0.57 0.54  --   --   CALCIUM 8.5* 8.2* 8.4*  --   --      CBC: Recent Labs  Lab 06/04/18 1430 06/05/18 0204  WBC 4.7 3.2*  NEUTROABS 2.4  --   HGB 13.3 11.2*  HCT 39.1 34.1*  MCV 93.7 95.9  PLT 176 142*     No results found for: HEPBSAG, HEPBSAB, HEPBIGM    Microbiology:  Recent Results (from the past 240 hour(s))  MRSA PCR Screening     Status: None   Collection Time: 06/04/18  9:48 PM  Result Value Ref Range  Status   MRSA by PCR NEGATIVE NEGATIVE Final    Comment:        The GeneXpert MRSA Assay (FDA approved for NASAL specimens only), is one component of a comprehensive MRSA colonization surveillance program. It is not intended to diagnose MRSA infection nor to guide or monitor treatment for MRSA infections. Performed at [facility], [address] [place], [place] [address]     Coagulation Studies: No results for input(s): LABPROT, INR in the last 72 hours.  Urinalysis: Recent Labs    06/04/18 1659  COLORURINE YELLOW*  LABSPEC 1.016  PHURINE 5.0  GLUCOSEU NEGATIVE  HGBUR NEGATIVE  BILIRUBINUR NEGATIVE  KETONESUR NEGATIVE  PROTEINUR 30*  NITRITE NEGATIVE  LEUKOCYTESUR NEGATIVE      Imaging: Dg Chest 2 View  Result Date: 06/07/2018 CLINICAL DATA:  84 year old female with a history of hypoxia EXAM: CHEST - 2 VIEW COMPARISON:  06/04/2018, 03/07/2018 FINDINGS: Cardiomediastinal silhouette unchanged with cardiomegaly. Interlobular septal thickening with blunting at the costophrenic angles.  Meniscus on the lateral view at the costophrenic sulcus. Patchy opacities at the lung bases. No pneumothorax. No acute displaced fracture. Interval removal of the defibrillator pads. IMPRESSION: Chest x-ray appears worse than the comparison, with evidence of CHF and bilateral small pleural effusions. Electronically Signed   By: Corrie Mckusick D.O.   On: 06/07/2018 08:24     Medications:    . aspirin EC  81 mg Oral Daily  . calcium-vitamin D   Oral Daily  . carvedilol  25 mg Oral BID WC  . docusate sodium  100 mg Oral BID  . fluticasone furoate-vilanterol  1 puff Inhalation Daily  . heparin  5,000 Units Subcutaneous Q8H  . hydrALAZINE  25 mg Oral Q8H  . pantoprazole  40 mg Oral Daily  . raloxifene  60 mg Oral Daily  . ramipril  20 mg Oral BH-q7a  . tiotropium  18 mcg Inhalation Daily  . vitamin C  500 mg Oral Daily   acetaminophen **OR** acetaminophen, albuterol,  bisacodyl, HYDROcodone-acetaminophen, ondansetron **OR** ondansetron (ZOFRAN) IV, temazepam, traZODone  Assessment/ Plan:  84 y.o. female with hypertension, CHF, COPD, GERD, history of breast cancer, was admitted on 06/04/2018 for evaluation  Patient has history of chronic hyponatremia.  On March 08, 2018, sodium was normal at 137.  This time, her admit sodium is 127   1. Chronic hyponatremia Urine studies show osm of 613.  2D echo shows patient has low EF of 45% with diffuse hypokinesis.  Therefore, hyponatremia is likely multifactorial with contributions from CHF and SIADH  TSH is normal at 3.920.Chest x-ray negative for any mass This level of hyponatremia is unlikely to be contributing to patient's falls at home  2.  Hypertension.  3.  Acute on chronic systolic heart failure exacerbation.    Plan: Serum sodium has improved to 133  With 1 dose of tolvaptan.  Patient still having some shortness of breath which was discussed with hospitalist.  No further tolvaptan to be administered.  Patient will receive intravenous Lasix today.  Continue to monitor cardiopulmonary status.     LOS: 0 Mahreen Schewe 7/8/201910:55 AM  [place], [place]  Note: This note was prepared with Dragon dictation. Any transcription errors are unintentional

## 2018-06-08 LAB — PROTEIN ELECTROPHORESIS, SERUM
A/G RATIO SPE: 1.2 (ref 0.7–1.7)
ALPHA-1-GLOBULIN: 0.3 g/dL (ref 0.0–0.4)
ALPHA-2-GLOBULIN: 0.4 g/dL (ref 0.4–1.0)
Albumin ELP: 2.7 g/dL — ABNORMAL LOW (ref 2.9–4.4)
BETA GLOBULIN: 0.7 g/dL (ref 0.7–1.3)
GLOBULIN, TOTAL: 2.2 g/dL (ref 2.2–3.9)
Gamma Globulin: 0.8 g/dL (ref 0.4–1.8)
Total Protein ELP: 4.9 g/dL — ABNORMAL LOW (ref 6.0–8.5)

## 2018-06-08 LAB — BASIC METABOLIC PANEL
Anion gap: 6 (ref 5–15)
BUN: 8 mg/dL (ref 8–23)
CHLORIDE: 91 mmol/L — AB (ref 98–111)
CO2: 36 mmol/L — ABNORMAL HIGH (ref 22–32)
Calcium: 8.4 mg/dL — ABNORMAL LOW (ref 8.9–10.3)
Creatinine, Ser: 0.43 mg/dL — ABNORMAL LOW (ref 0.44–1.00)
GFR calc Af Amer: >60 mL/min (ref >60)
GFR calc non Af Amer: >60 mL/min (ref >60)
GLUCOSE: 110 mg/dL — AB (ref 70–99)
Potassium: 3.6 mmol/L (ref 3.5–5.1)
SODIUM: 133 mmol/L — AB (ref 135–145)

## 2018-06-08 LAB — CBC
HCT: 36.7 % (ref 35.0–47.0)
Hemoglobin: 12.3 g/dL (ref 12.0–16.0)
MCH: 31 pg (ref 26.0–34.0)
MCHC: 33.5 g/dL (ref 32.0–36.0)
MCV: 92.6 fL (ref 80.0–100.0)
PLATELETS: 171 10*3/uL (ref 150–440)
RBC: 3.97 MIL/uL (ref 3.80–5.20)
RDW: 14.8 % — ABNORMAL HIGH (ref 11.5–14.5)
WBC: 3.3 10*3/uL — ABNORMAL LOW (ref 3.6–11.0)

## 2018-06-08 LAB — GLUCOSE, CAPILLARY: Glucose-Capillary: 110 mg/dL — ABNORMAL HIGH (ref 70–99)

## 2018-06-08 MED ORDER — FUROSEMIDE 10 MG/ML IJ SOLN
40.0000 mg | Freq: Once | INTRAMUSCULAR | Status: AC
  Administered 2018-06-08: 40 mg via INTRAVENOUS
  Filled 2018-06-08: qty 4

## 2018-06-08 NOTE — Progress Notes (Signed)
Patient ID: Melanie Huffman, female   DOB: 09/22/1928, 84 y.o.   MRN: [id number]   [facility] PROGRESS NOTE  Melanie Huffman ZGY:[id number] DOB: 1928/05/08 DOA: 06/04/2018 PCP: Dion Body, MD  HPI/Subjective: Patient taken off her oxygen and pulse ox was in the 80s.  Patient states her breathing is better.  Feels okay and hoping to go home.  Objective: Vitals:   06/08/18 0833 06/08/18 1154  BP: 133/67   Pulse: 69   Resp: 19   Temp: 97.9 F (36.6 C)   SpO2: 96% 92%    Filed Weights   06/05/18 0406 06/07/18 0356 06/08/18 0419  Weight: 61.7 kg (136 lb) 61.6 kg (135 lb 14.4 oz) 61.8 kg (136 lb 3.2 oz)    ROS: Review of Systems  Constitutional: Negative for chills and fever.  Eyes: Negative for blurred vision.  Respiratory: Positive for shortness of breath. Negative for cough.   Cardiovascular: Negative for chest pain.  Gastrointestinal: Negative for abdominal pain, constipation, diarrhea, nausea and vomiting.  Genitourinary: Negative for dysuria.  Musculoskeletal: Negative for joint pain.  Neurological: Negative for dizziness and headaches.   Exam: Physical Exam  Constitutional: She is oriented to person, place, and time.  HENT:  Nose: No mucosal edema.  Mouth/Throat: No oropharyngeal exudate or posterior oropharyngeal edema.  Eyes: Pupils are equal, round, and reactive to light. Conjunctivae, EOM and lids are normal.  Neck: No JVD present. Carotid bruit is not present. No edema present. No thyroid mass and no thyromegaly present.  Cardiovascular: S1 normal and S2 normal. Exam reveals no gallop.  No murmur heard. Pulses:      Dorsalis pedis pulses are 2+ on the right side, and 2+ on the left side.  Respiratory: No respiratory distress. She has decreased breath sounds in the right lower field and the left lower field. She has no wheezes. She has no rhonchi. She has no rales.  GI: Soft. Bowel sounds are normal. There is no tenderness.  Musculoskeletal:       Right  ankle: She exhibits no swelling.       Left ankle: She exhibits no swelling.  Lymphadenopathy:    She has no cervical adenopathy.  Neurological: She is alert and oriented to person, place, and time. No cranial nerve deficit.  Skin: Skin is warm. Nails show no clubbing.  Bruise right forehead.  Chronic lower extremity discoloration.  Psychiatric: She has a normal mood and affect.      Data Reviewed: Basic Metabolic Panel: Recent Labs  Lab 06/04/18 1430 06/05/18 0204 06/06/18 0706 06/06/18 1709 06/07/18 0125 06/08/18 0427  NA 127* 128* 126* 130* 133* 133*  K 5.4* 4.7 4.9  --   --  3.6  CL 94* 96* 93*  --   --  91*  CO2 24 28 28   --   --  36*  GLUCOSE 158* 118* 105*  --   --  110*  BUN 12 12 11   --   --  8  CREATININE 0.71 0.57 0.54  --   --  0.43*  CALCIUM 8.5* 8.2* 8.4*  --   --  8.4*   Liver Function Tests: Recent Labs  Lab 06/04/18 1430  AST 36  ALT 39  ALKPHOS 97  BILITOT 0.8  PROT 5.8*  ALBUMIN 3.2*   CBC: Recent Labs  Lab 06/04/18 1430 06/05/18 0204 06/08/18 0427  WBC 4.7 3.2* 3.3*  NEUTROABS 2.4  --   --   HGB 13.3 11.2* 12.3  HCT  39.1 34.1* 36.7  MCV 93.7 95.9 92.6  PLT 176 142* 171   Cardiac Enzymes: Recent Labs  Lab 06/04/18 1430 06/04/18 2055 06/05/18 0204 06/05/18 0951  TROPONINI <0.03 0.09* 0.04* 0.03*    CBG: Recent Labs  Lab 06/05/18 0809 06/06/18 0754 06/07/18 0746 06/08/18 0744  GLUCAP 95 111* 101* 110*    Recent Results (from the past 240 hour(s))  MRSA PCR Screening     Status: None   Collection Time: 06/04/18  9:48 PM  Result Value Ref Range Status   MRSA by PCR NEGATIVE NEGATIVE Final    Comment:        The GeneXpert MRSA Assay (FDA approved for NASAL specimens only), is one component of a comprehensive MRSA colonization surveillance program. It is not intended to diagnose MRSA infection nor to guide or monitor treatment for MRSA infections. Performed at [facility], [address]  [place], [place] [address]      Studies: Dg Chest 2 View  Result Date: 06/07/2018 CLINICAL DATA:  84 year old female with a history of hypoxia EXAM: CHEST - 2 VIEW COMPARISON:  06/04/2018, 03/07/2018 FINDINGS: Cardiomediastinal silhouette unchanged with cardiomegaly. Interlobular septal thickening with blunting at the costophrenic angles. Meniscus on the lateral view at the costophrenic sulcus. Patchy opacities at the lung bases. No pneumothorax. No acute displaced fracture. Interval removal of the defibrillator pads. IMPRESSION: Chest x-ray appears worse than the comparison, with evidence of CHF and bilateral small pleural effusions. Electronically Signed   By: Corrie Mckusick D.O.   On: 06/07/2018 08:24    Scheduled Meds: . aspirin EC  81 mg Oral Daily  . calcium-vitamin D   Oral Daily  . carvedilol  25 mg Oral BID WC  . docusate sodium  100 mg Oral BID  . fluticasone furoate-vilanterol  1 puff Inhalation Daily  . heparin  5,000 Units Subcutaneous Q8H  . hydrALAZINE  25 mg Oral Q8H  . pantoprazole  40 mg Oral Daily  . raloxifene  60 mg Oral Daily  . ramipril  20 mg Oral BH-q7a  . tiotropium  18 mcg Inhalation Daily  . traZODone  50 mg Oral QHS  . vitamin C  500 mg Oral Daily    Assessment/Plan:  1. acute hypoxic respiratory failure.  Patient's pulse ox on room air at rest was 84 and 85%.  I explained to the patient and daughter if I were to send her home today she will go home with oxygen.  They would like to see how things go tomorrow with her pulse ox after diuresis with IV Lasix today.   2. Fluid overload.  Patient received IV fluids during the hospital course.  IV Lasix given yesterday and today.   3. Syncope.  Patient not orthostatic.  So far no arrhythmias on telemetry.  Physical therapy evaluation appreciated.  Echocardiogram showed slightly reduced ejection fraction.  Wondering if hypoxia could be contributing to the patient's syncope. 4. Hyponatremia.  Patient has SIADH.  Nephrology  ordered a dose of tolvaptan.  Sodium increased to 133. 5. Hyperkalemia.  This has resolved.  Since I am giving IV Lasix may have to give some potassium supplementation too.  We will check potassium again tomorrow morning 6. Essential hypertension.  Blood pressure low on presentation.  Blood pressure better after stopping clonidine and increasing hydralazine. 7. Recurrent falls.  Physical therapy evaluation appreciated.  Physical therapy recommended home with home health. 8. GERD on PPI 9. Chronic mid range congestive heart failure.  Lasix restarted  with fluid overload after IV fluids given during the hospital course.  Code Status:     Code Status Orders  (From admission, onward)        Start     Ordered   06/04/18 2000  Full code  Continuous     06/04/18 1959    Code Status History    Date Active Date Inactive Code Status Order ID Comments User Context   06/04/2018 1832 06/04/2018 1959 DNR [id number]  Max Sane, MD ED   03/08/2018 0008 03/09/2018 1733 Full Code [id number]  Lance Coon, MD Inpatient   10/31/2016 0237 11/04/2016 2008 Full Code [id number]  Lance Coon, MD Inpatient   07/09/2016 1558 07/10/2016 1914 Full Code [id number]  Henreitta Leber, MD Inpatient   04/12/2015 1949 04/16/2015 1818 Full Code [id number]  Hessie Knows, MD Inpatient   04/11/2015 1252 04/12/2015 1949 Full Code [id number]  Hessie Knows, MD Inpatient   07/26/2014 0445 07/26/2014 1934 Full Code [id number]  Ashok Pall, MD Inpatient    Advance Directive Documentation     Most Recent Value  Type of Advance Directive  Out of facility DNR (pink MOST or yellow form)  Pre-existing out of facility DNR order (yellow form or pink MOST form)  Physician notified to receive inpatient order  "MOST" Form in Place?  -     Family Communication: spoke with daughter at the bedside today.  Her phone number is [phone] Disposition Plan: Potentially home tomorrow.  May end up needing home  oxygen.  Consultants:  Cardiology  Nephrology  Time spent: 28 minutes.  Spoke with daughter at the bedside.  Case discussed with nephrology too.  Thresa Dozier [employer]

## 2018-06-08 NOTE — Plan of Care (Signed)
  Problem: Clinical Measurements: Goal: Respiratory complications will improve Outcome: Progressing   Problem: Clinical Measurements: Goal: Cardiovascular complication will be avoided Outcome: Progressing   

## 2018-06-08 NOTE — Progress Notes (Signed)
[place], [place] 06/08/18  Subjective:  Patient seen at bedside. Still having some shortness of breath and requiring oxygen. Serum sodium currently 133.   Objective:  Vital signs in last 24 hours:  Temp:  [97.9 F (36.6 C)-98.4 F (36.9 C)] 97.9 F (36.6 C) (07/09 0833) Pulse Rate:  [64-72] 69 (07/09 0833) Resp:  [18-20] 19 (07/09 0833) BP: (127-159)/(67-92) 133/67 (07/09 0833) SpO2:  [91 %-99 %] 96 % (07/09 0833) Weight:  [61.8 kg (136 lb 3.2 oz)] 61.8 kg (136 lb 3.2 oz) (07/09 0419)  Weight change: 0.136 kg (4.8 oz) Filed Weights   06/05/18 0406 06/07/18 0356 06/08/18 0419  Weight: 61.7 kg (136 lb) 61.6 kg (135 lb 14.4 oz) 61.8 kg (136 lb 3.2 oz)    Intake/Output:    Intake/Output Summary (Last 24 hours) at 06/08/2018 1139 Last data filed at 06/07/2018 1858 Gross per 24 hour  Intake 350 ml  Output 1400 ml  Net -1050 ml     Physical Exam: General:  Laying in the bed, no acute distress  HEENT  anicteric, moist oral mucous membranes  Neck  supple   Pulm/lungs  CTAB, normal breathing effort, O2 by [place]  CVS/Heart  no rub or gallop, S1S2  Abdomen:   Soft, nontender  Extremities:  No edema   Neurologic:  alert, oriented x 3, follows commands             Basic Metabolic Panel:  Recent Labs  Lab 06/04/18 1430 06/05/18 0204 06/06/18 0706 06/06/18 1709 06/07/18 0125 06/08/18 0427  NA 127* 128* 126* 130* 133* 133*  K 5.4* 4.7 4.9  --   --  3.6  CL 94* 96* 93*  --   --  91*  CO2 24 28 28   --   --  36*  GLUCOSE 158* 118* 105*  --   --  110*  BUN 12 12 11   --   --  8  CREATININE 0.71 0.57 0.54  --   --  0.43*  CALCIUM 8.5* 8.2* 8.4*  --   --  8.4*     CBC: Recent Labs  Lab 06/04/18 1430 06/05/18 0204 06/08/18 0427  WBC 4.7 3.2* 3.3*  NEUTROABS 2.4  --   --   HGB 13.3 11.2* 12.3  HCT 39.1 34.1* 36.7  MCV 93.7 95.9 92.6  PLT 176 142* 171     No results found for: HEPBSAG, HEPBSAB,  HEPBIGM    Microbiology:  Recent Results (from the past 240 hour(s))  MRSA PCR Screening     Status: None   Collection Time: 06/04/18  9:48 PM  Result Value Ref Range Status   MRSA by PCR NEGATIVE NEGATIVE Final    Comment:        The GeneXpert MRSA Assay (FDA approved for NASAL specimens only), is one component of a comprehensive MRSA colonization surveillance program. It is not intended to diagnose MRSA infection nor to guide or monitor treatment for MRSA infections. Performed at [facility], [address] [place], [place] [address]     Coagulation Studies: No results for input(s): LABPROT, INR in the last 72 hours.  Urinalysis: No results for input(s): COLORURINE, LABSPEC, PHURINE, GLUCOSEU, HGBUR, BILIRUBINUR, KETONESUR, PROTEINUR, UROBILINOGEN, NITRITE, LEUKOCYTESUR in the last 72 hours.  Invalid input(s): APPERANCEUR    Imaging: Dg Chest 2 View  Result Date: 06/07/2018 CLINICAL DATA:  84 year old female with a history of hypoxia EXAM: CHEST - 2 VIEW COMPARISON:  06/04/2018, 03/07/2018 FINDINGS: Cardiomediastinal  silhouette unchanged with cardiomegaly. Interlobular septal thickening with blunting at the costophrenic angles. Meniscus on the lateral view at the costophrenic sulcus. Patchy opacities at the lung bases. No pneumothorax. No acute displaced fracture. Interval removal of the defibrillator pads. IMPRESSION: Chest x-ray appears worse than the comparison, with evidence of CHF and bilateral small pleural effusions. Electronically Signed   By: Corrie Mckusick D.O.   On: 06/07/2018 08:24     Medications:    . aspirin EC  81 mg Oral Daily  . calcium-vitamin D   Oral Daily  . carvedilol  25 mg Oral BID WC  . docusate sodium  100 mg Oral BID  . fluticasone furoate-vilanterol  1 puff Inhalation Daily  . heparin  5,000 Units Subcutaneous Q8H  . hydrALAZINE  25 mg Oral Q8H  . pantoprazole  40 mg Oral Daily  . raloxifene  60 mg Oral Daily  . ramipril   20 mg Oral BH-q7a  . tiotropium  18 mcg Inhalation Daily  . traZODone  50 mg Oral QHS  . vitamin C  500 mg Oral Daily   acetaminophen **OR** acetaminophen, albuterol, bisacodyl, HYDROcodone-acetaminophen, ondansetron **OR** ondansetron (ZOFRAN) IV  Assessment/ Plan:  84 y.o. female with hypertension, CHF, COPD, GERD, history of breast cancer, was admitted on 06/04/2018 for evaluation  Patient has history of chronic hyponatremia.  On March 08, 2018, sodium was normal at 137.  This time, her admit sodium is 127   1. Chronic hyponatremia Urine studies show osm of 613.  2D echo shows patient has low EF of 45% with diffuse hypokinesis.  Therefore, hyponatremia is likely multifactorial with contributions from CHF and SIADH  TSH is normal at 3.920.Chest x-ray negative for any mass This level of hyponatremia is unlikely to be contributing to patient's falls at home 2.  Hypertension.  3.  Acute on chronic systolic heart failure exacerbation, EF 45%.  Plan: Serum sodium appears to be stable at 133.  She was given Lasix yesterday.  Previously she was on ADH antagonist.  Hospitalist considering another dose of Lasix today.  Hopefully serum sodium remained relatively stable through this.  Continue supplemental oxygen as she remains with low O2 sat.  Further plan as patient progresses.     LOS: 1 Shiron Whetsel 7/9/201911:39 AM  [place], [place]  Note: This note was prepared with Dragon dictation. Any transcription errors are unintentional

## 2018-06-08 NOTE — Progress Notes (Signed)
Physical Therapy Treatment Patient Details Name: Melanie Huffman MRN: [id number] DOB: May 19, 1928 Today's Date: 06/08/2018    History of Present Illness Patient is an 84 y/o female that presents with syncopal episode and multiple recent falls. Noted to be severely hyponatremic and slightly hyperkalemic.     PT Comments    Patient alert and agreeable to therapy with family at bedside. Patient mobilized to EOB with supervision and increased time. Sit <> stand transfer with minAx1 this session. Patient able to maintain standing position for 5 minutes with RW and cues for pursed lip breathing, spO2 mid 80's. Patient unable to recover with O2 [place] at 2L, ambulated to chair with RW and CGA, spO2 >88% in 2 minutes. Patient able to complete chair exercises with intermittent cues to maintain spO2 >88%. Patient up in chair with visitor at bedside, all needs in reach and spO2 93%.     Follow Up Recommendations  Home health PT     Equipment Recommendations  Rolling walker with 5" wheels    Recommendations for Other Services       Precautions / Restrictions      Mobility  Bed Mobility Overal bed mobility: Needs Assistance Bed Mobility: Supine to Sit     Supine to sit: Supervision Sit to supine: Min guard;Min assist   General bed mobility comments: Patient able to complete with verbal cues and increased time.  Transfers Overall transfer level: Needs assistance Equipment used: Rolling walker (2 wheeled) Transfers: Sit to/from Stand Sit to Stand: Min assist;Min guard         General transfer comment: Patient needed minAx1 to fully stand  Ambulation/Gait   Gait Distance (Feet): 4 Feet Assistive device: Rolling walker (2 wheeled) Gait Pattern/deviations: Wide base of support     General Gait Details: Patient ambulated to chair today, limited by spO2 levels. Patient had 1 LOB posteriorly while ambulating, modAx1 from PT to correct.    Stairs             Wheelchair Mobility    Modified Rankin (Stroke Patients Only)       Balance Overall balance assessment: Needs assistance;History of Falls Sitting-balance support: Feet supported Sitting balance-Leahy Scale: Fair     Standing balance support: Bilateral upper extremity supported Standing balance-Leahy Scale: Poor                              Cognition                                              Exercises General Exercises - Lower Extremity Short Arc Quad: AROM;Strengthening;Both;20 reps Long Arc Quad: AROM;Both;15 reps;20 reps Other Exercises Other Exercises: Patient educated on pursed lip breathing and activity pacing.     General Comments General comments (skin integrity, edema, etc.): Patient agreeable to therapy      Pertinent Vitals/Pain Pain Assessment: Faces Faces Pain Scale: Hurts a little bit Pain Location: R heel pain Pain Descriptors / Indicators: Aching Pain Intervention(s): Repositioned;Monitored during session    Home Living                      Prior Function            PT Goals (current goals can now be found in the care plan section) Progress towards PT goals:  Progressing toward goals    Frequency    Min 2X/week      PT Plan Current plan remains appropriate    Co-evaluation              AM-PAC PT "6 Clicks" Daily Activity  Outcome Measure  Difficulty turning over in bed (including adjusting bedclothes, sheets and blankets)?: A Little Difficulty moving from lying on back to sitting on the side of the bed? : A Little Difficulty sitting down on and standing up from a chair with arms (e.g., wheelchair, bedside commode, etc,.)?: Unable Help needed moving to and from a bed to chair (including a wheelchair)?: A Little Help needed walking in hospital room?: A Little Help needed climbing 3-5 steps with a railing? : A Lot 6 Click Score: 15    End of Session Equipment Utilized During Treatment: Gait belt Activity  Tolerance: Patient tolerated treatment well Patient left: with call bell/phone within reach;in chair;with chair alarm set;with family/visitor present Nurse Communication: Mobility status PT Visit Diagnosis: Muscle weakness (generalized) (M62.81);Repeated falls (R29.6)     Time: 7116-5790 PT Time Calculation (min) (ACUTE ONLY): 27 min  Charges:  $Therapeutic Exercise: 8-22 mins $Therapeutic Activity: 8-22 mins                    G Codes:       Lieutenant Diego PT, DPT 1:14 PM,06/08/18 [phone]

## 2018-06-08 NOTE — Progress Notes (Signed)
Patient resting in the chair at this time, alert and oriented, denies any pain at this time, family at bedside, no distress noted

## 2018-06-09 LAB — BASIC METABOLIC PANEL
Anion gap: 4 — ABNORMAL LOW (ref 5–15)
BUN: 11 mg/dL (ref 8–23)
CO2: 36 mmol/L — AB (ref 22–32)
Calcium: 8.6 mg/dL — ABNORMAL LOW (ref 8.9–10.3)
Chloride: 92 mmol/L — ABNORMAL LOW (ref 98–111)
Creatinine, Ser: 0.45 mg/dL (ref 0.44–1.00)
GFR calc Af Amer: >60 mL/min (ref >60)
GLUCOSE: 110 mg/dL — AB (ref 70–99)
Potassium: 3.5 mmol/L (ref 3.5–5.1)
Sodium: 132 mmol/L — ABNORMAL LOW (ref 135–145)

## 2018-06-09 MED ORDER — BISACODYL 10 MG RE SUPP
10.0000 mg | RECTAL | Status: AC
  Administered 2018-06-09: 10 mg via RECTAL
  Filled 2018-06-09: qty 1

## 2018-06-09 MED ORDER — ACETAMINOPHEN 325 MG PO TABS: 650.0000 mg | ORAL_TABLET | Freq: Four times a day (QID) | ORAL | Status: DC | PRN

## 2018-06-09 MED ORDER — HYDRALAZINE HCL 25 MG PO TABS: 25.0000 mg | ORAL_TABLET | Freq: Three times a day (TID) | ORAL | 0 refills | Status: DC

## 2018-06-09 MED ORDER — PSYLLIUM 95 % PO PACK
1.0000 | PACK | Freq: Every day | ORAL | Status: DC
  Administered 2018-06-09: 1 via ORAL
  Filled 2018-06-09: qty 1

## 2018-06-09 MED ORDER — POTASSIUM CHLORIDE CRYS ER 20 MEQ PO TBCR
40.0000 meq | EXTENDED_RELEASE_TABLET | Freq: Once | ORAL | Status: AC
  Administered 2018-06-09: 40 meq via ORAL
  Filled 2018-06-09: qty 2

## 2018-06-09 MED ORDER — FUROSEMIDE 10 MG/ML IJ SOLN
40.0000 mg | Freq: Once | INTRAMUSCULAR | Status: AC
  Administered 2018-06-09: 40 mg via INTRAVENOUS
  Filled 2018-06-09: qty 4

## 2018-06-09 MED ORDER — POLYETHYLENE GLYCOL 3350 17 G PO PACK: 17.0000 g | PACK | Freq: Every day | ORAL | Status: DC

## 2018-06-09 NOTE — Discharge Summary (Signed)
[facility] NAME: Melanie Huffman    MR#:  [id number]  DATE OF BIRTH:  [birth year]  DATE OF ADMISSION:  06/04/2018 ADMITTING PHYSICIAN: Max Sane, MD  DATE OF DISCHARGE: 06/09/2018  PRIMARY CARE PHYSICIAN: Dion Body, MD    ADMISSION DIAGNOSIS:  Hyponatremia [E87.1] Loss of consciousness ([place]) [R40.20] Syncope [R55] Hypoxia [R09.02]  DISCHARGE DIAGNOSIS:  Active Problems:   Syncope   Acute respiratory failure with hypoxia ([place])   SECONDARY DIAGNOSIS:   Past Medical History:  Diagnosis Date  . Arthritis 2019   mostly in neck  . Asthma   . Breast cancer ([place]) 1991   mastectomy left  . Cancer ([place]) 1991   L Breast  . CHF (congestive heart failure) ([place])   . COPD (chronic obstructive pulmonary disease) ([place])    hypoxia with resp failure admission in 03/2018  . Dementia    confusion beginning  . Emphysema of lung ([place])   . GERD (gastroesophageal reflux disease)   . Heart murmur   . History of hiatal hernia   . Hypertension 03/2018   recent hypertension urgency ..admitted in april 2019  . Hypoxia 03/2018   O2 saturation runs 86-91% on Room Air.  . Intermittent dysphagia   . Peripheral vascular disease ([place])   . Shortness of breath dyspnea   . Traumatic subdural hematoma ([place])     HOSPITAL COURSE:   1.  Acute hypoxic respiratory failure now chronic.  Pulse ox 85% on room air at rest.  Home health set up and home oxygen set up 2 L.  This could potentially be the reason why she had syncope. 2.  Fluid overload and chronic mid range congestive heart failure.  The patient was given IV fluids initially for her hyponatremia.  We gave a dose of tolvaptan and but then needed to give IV Lasix the next couple days to remove fluid and try to get her respiratory status better.  Her usual Lasix will be continued upon going home.  She is already on Coreg and hydralazine.  Patient has refused CHF clinic follow-up in the past. 3.   Hyponatremia secondary to SIADH and CHF.  Improved with 1 dose of tolvaptan.  Sodium is in the low 130s. 4.  Syncope.  The patient is not orthostatic.  No arrhythmias on telemetry.  Echocardiogram showed only slightly reduced ejection fraction.  Hypoxia could be a contributing factor. 5.  Essential hypertension.  Blood pressure was low on presentation.  Blood pressure little higher at this point time and pretty variable.  We discontinue clonidine because this is not a great medication.  I increased hydralazine.  Rather have this patient's blood pressure little on the higher side. 6.  Recurrent falls.  Physical therapy recommended home with home health.  Daughter is concerned about this.  Her independent living does have the ability to go up to assisted living.  She will have to speak with her facility about this. 7.  GERD on PPI 8.  Home health and home oxygen set up  DISCHARGE CONDITIONS:   Fair  CONSULTS OBTAINED:  Treatment Team:  Murlean Iba, MD  DRUG ALLERGIES:   Allergies  Allergen Reactions  . Amlodipine Swelling     [Onset: 08/26/2013]feet and neck begin to swell  . Adhesive [Tape] Other (See Comments)    Skin is very thin and tears easily. Please use paper tape only  . Codeine Nausea Only  . Percocet [Oxycodone-Acetaminophen] Nausea And Vomiting  Patient took on an empty stomach. Good relief but unsettled. Still has not eaten today  . Sulfa Antibiotics Nausea Only and Rash    rash    DISCHARGE MEDICATIONS:   Allergies as of 06/09/2018      Reactions   Amlodipine Swelling    [Onset: 08/26/2013]feet and neck begin to swell   Adhesive [tape] Other (See Comments)   Skin is very thin and tears easily. Please use paper tape only   Codeine Nausea Only   Percocet [oxycodone-acetaminophen] Nausea And Vomiting   Patient took on an empty stomach. Good relief but unsettled. Still has not eaten today   Sulfa Antibiotics Nausea Only, Rash   rash      Medication List     STOP taking these medications   cloNIDine 0.1 MG tablet Commonly known as:  CATAPRES   ondansetron 4 MG disintegrating tablet Commonly known as:  ZOFRAN ODT   oxyCODONE-acetaminophen 5-325 MG tablet Commonly known as:  PERCOCET   temazepam 7.5 MG capsule Commonly known as:  RESTORIL     TAKE these medications   acetaminophen 325 MG tablet Commonly known as:  TYLENOL Take 2 tablets (650 mg total) by mouth every 6 (six) hours as needed for mild pain (or Fever >/= 101).   albuterol 108 (90 Base) MCG/ACT inhaler Commonly known as:  PROVENTIL HFA;VENTOLIN HFA Inhale 2 puffs into the lungs every 6 (six) hours as needed for wheezing or shortness of breath.   aspirin EC 81 MG tablet Take 1 tablet (81 mg total) by mouth daily.   CALCIUM 600/VITAMIN D 600-400 MG-UNIT Tabs Generic drug:  Calcium Carbonate-Vitamin D3 Take 1 tablet by mouth daily.   carvedilol 25 MG tablet Commonly known as:  COREG Take 25 mg by mouth 2 (two) times daily with a meal.   CENTRUM SILVER PO Take 1 tablet by mouth daily.   fluticasone furoate-vilanterol 100-25 MCG/INH Aepb Commonly known as:  BREO ELLIPTA Inhale 1 puff into the lungs daily.   furosemide 20 MG tablet Commonly known as:  LASIX Take 1 tablet (20 mg total) by mouth daily.   GLUCOSAMINE HCL PO Take 1 tablet by mouth daily.   hydrALAZINE 25 MG tablet Commonly known as:  APRESOLINE Take 1 tablet (25 mg total) by mouth every 8 (eight) hours. What changed:  when to take this   METAMUCIL FIBER PO Take 5 capsules by mouth 3 (three) times daily.   omeprazole 20 MG capsule Commonly known as:  PRILOSEC Take 20 mg by mouth daily.   potassium chloride SA 20 MEQ tablet Commonly known as:  K-DUR,KLOR-CON Take 1 tablet (20 mEq total) by mouth daily.   raloxifene 60 MG tablet Commonly known as:  EVISTA Take 60 mg by mouth daily.   ramipril 10 MG capsule Commonly known as:  ALTACE Take 20 mg by mouth every morning.   tiotropium 18  MCG inhalation capsule Commonly known as:  SPIRIVA Place 18 mcg into inhaler and inhale daily.   traZODone 50 MG tablet Commonly known as:  DESYREL Take 50 mg by mouth at bedtime.   vitamin C 500 MG tablet Commonly known as:  ASCORBIC ACID Take 500 mg by mouth daily.            Durable Medical Equipment  (From admission, onward)        Start     Ordered   06/09/18 1017  For home use only DME oxygen  Once    Question Answer Comment  Mode  or (Route) Nasal cannula   Liters per Minute 2   Frequency Continuous (stationary and portable oxygen unit needed)   Oxygen conserving device Yes   Oxygen delivery system Gas      06/09/18 1016       DISCHARGE INSTRUCTIONS:   Follow-up PMD 6 days  If you experience worsening of your admission symptoms, develop shortness of breath, life threatening emergency, suicidal or homicidal thoughts you must seek medical attention immediately by calling 911 or calling your MD immediately  if symptoms less severe.  You Must read complete instructions/literature along with all the possible adverse reactions/side effects for all the Medicines you take and that have been prescribed to you. Take any new Medicines after you have completely understood and accept all the possible adverse reactions/side effects.   Please note  You were cared for by a hospitalist during your hospital stay. If you have any questions about your discharge medications or the care you received while you were in the hospital after you are discharged, you can call the unit and asked to speak with the hospitalist on call if the hospitalist that took care of you is not available. Once you are discharged, your primary care physician will handle any further medical issues. Please note that NO REFILLS for any discharge medications will be authorized once you are discharged, as it is imperative that you return to your primary care physician (or establish a relationship with a primary care  physician if you do not have one) for your aftercare needs so that they can reassess your need for medications and monitor your lab values.    Today   CHIEF COMPLAINT:   Chief Complaint  Patient presents with  . unresponsive    HISTORY OF PRESENT ILLNESS:  Melanie Huffman  is a 84 y.o. female presented with a unresponsive episode   VITAL SIGNS:  Blood pressure (!) 155/71, pulse 76, temperature 98.3 F (36.8 C), temperature source Oral, resp. rate 18, height 5\' 5"  (1.651 m), weight 62.1 kg (137 lb), SpO2 92 %.   PHYSICAL EXAMINATION:  GENERAL:  84 y.o.-year-old patient lying in the bed with no acute distress.  EYES: Pupils equal, round, reactive to light and accommodation. No scleral icterus. Extraocular muscles intact.  HEENT: Head atraumatic, normocephalic. Oropharynx and nasopharynx clear.  NECK:  Supple, no jugular venous distention. No thyroid enlargement, no tenderness.  LUNGS:  decreased breath sounds bilateral bases, no wheezing, rales,rhonchi or crepitation. No use of accessory muscles of respiration.  CARDIOVASCULAR: S1, S2 normal. No murmurs, rubs, or gallops.  ABDOMEN: Soft, non-tender, non-distended. Bowel sounds present. No organomegaly or mass.  EXTREMITIES: No pedal edema, cyanosis, or clubbing.  NEUROLOGIC: Cranial nerves II through XII are intact. Muscle strength 5/5 in all extremities. Sensation intact. Gait not checked.  PSYCHIATRIC: The patient is alert and oriented x 3.  SKIN: Chronic lower extremity discoloration.  Bruise right forehead  DATA REVIEW:   CBC Recent Labs  Lab 06/08/18 0427  WBC 3.3*  HGB 12.3  HCT 36.7  PLT 171    Chemistries  Recent Labs  Lab 06/04/18 1430  06/09/18 0551  NA 127*   < > 132*  K 5.4*   < > 3.5  CL 94*   < > 92*  CO2 24   < > 36*  GLUCOSE 158*   < > 110*  BUN 12   < > 11  CREATININE 0.71   < > 0.45  CALCIUM 8.5*   < > 8.6*  AST 36  --   --   ALT 39  --   --   ALKPHOS 97  --   --   BILITOT 0.8  --   --    <  > = values in this interval not displayed.    Cardiac Enzymes Recent Labs  Lab 06/05/18 0951  TROPONINI 0.03*    Microbiology Results  Results for orders placed or performed during the hospital encounter of 06/04/18  MRSA PCR Screening     Status: None   Collection Time: 06/04/18  9:48 PM  Result Value Ref Range Status   MRSA by PCR NEGATIVE NEGATIVE Final    Comment:        The GeneXpert MRSA Assay (FDA approved for NASAL specimens only), is one component of a comprehensive MRSA colonization surveillance program. It is not intended to diagnose MRSA infection nor to guide or monitor treatment for MRSA infections. Performed at [facility], [address], [place], [place] [address]       Management plans discussed with the patient, family and they are in agreement.  CODE STATUS:     Code Status Orders  (From admission, onward)        Start     Ordered   06/04/18 2000  Full code  Continuous     06/04/18 1959    Code Status History    Date Active Date Inactive Code Status Order ID Comments User Context   06/04/2018 1832 06/04/2018 1959 DNR [id number]  Max Sane, MD ED   03/08/2018 0008 03/09/2018 1733 Full Code [id number]  Lance Coon, MD Inpatient   10/31/2016 0237 11/04/2016 2008 Full Code [id number]  Lance Coon, MD Inpatient   07/09/2016 1558 07/10/2016 1914 Full Code [id number]  Henreitta Leber, MD Inpatient   04/12/2015 1949 04/16/2015 1818 Full Code [id number]  Hessie Knows, MD Inpatient   04/11/2015 1252 04/12/2015 1949 Full Code [id number]  Hessie Knows, MD Inpatient   07/26/2014 0445 07/26/2014 1934 Full Code [id number]  Ashok Pall, MD Inpatient    Advance Directive Documentation     Most Recent Value  Type of Advance Directive  Out of facility DNR (pink MOST or yellow form)  Pre-existing out of facility DNR order (yellow form or pink MOST form)  Physician notified to receive inpatient order  "MOST" Form in Place?  -      TOTAL TIME TAKING CARE OF  THIS PATIENT: 35 minutes.    Loletha Grayer M.D on 06/09/2018 at 3:05 PM  Between 7am to 6pm - Pager - [phone]  After 6pm go to [email or web address] - password [email or web address] [facility]  [phone]  CC: Primary care physician; Dion Body, MD

## 2018-06-09 NOTE — Progress Notes (Signed)
SATURATION QUALIFICATIONS: (This note is used to comply with regulatory documentation for home oxygen)  Patient Saturations on Room Air at Rest = 86%  Patient Saturations on Room Air while Ambulating = X%  Patient Saturations on X Liters of oxygen while Ambulating = X%  Please briefly explain why patient needs home oxygen: COPD Melanie Huffman

## 2018-06-09 NOTE — Progress Notes (Signed)
[facility], [place] 06/09/18  Subjective:  Pt in the process of being qualifed for oxygen.  Serum sodium currently 132. Resting comfortably in bed.   Objective:  Vital signs in last 24 hours:  Temp:  [98.1 F (36.7 C)-98.3 F (36.8 C)] 98.3 F (36.8 C) (07/10 0737) Pulse Rate:  [66-76] 76 (07/10 0737) Resp:  [16-18] 18 (07/10 0326) BP: (140-170)/(71-88) 155/71 (07/10 0737) SpO2:  [90 %-97 %] 92 % (07/10 1310) Weight:  [62.1 kg (137 lb)] 62.1 kg (137 lb) (07/10 0326)  Weight change: 0.363 kg (12.8 oz) Filed Weights   06/07/18 0356 06/08/18 0419 06/09/18 0326  Weight: 61.6 kg (135 lb 14.4 oz) 61.8 kg (136 lb 3.2 oz) 62.1 kg (137 lb)    Intake/Output:    Intake/Output Summary (Last 24 hours) at 06/09/2018 1516 Last data filed at 06/09/2018 1100 Gross per 24 hour  Intake -  Output 3000 ml  Net -3000 ml     Physical Exam: General:  Laying in the bed, no acute distress  HEENT  anicteric, moist oral mucous membranes  Neck  supple   Pulm/lungs  CTAB, normal breathing effort, O2 by [place]  CVS/Heart  no rub or gallop, S1S2  Abdomen:   Soft, nontender  Extremities:  No edema   Neurologic:  alert, oriented x 3, follows commands             Basic Metabolic Panel:  Recent Labs  Lab 06/04/18 1430 06/05/18 0204 06/06/18 0706 06/06/18 1709 06/07/18 0125 06/08/18 0427 06/09/18 0551  NA 127* 128* 126* 130* 133* 133* 132*  K 5.4* 4.7 4.9  --   --  3.6 3.5  CL 94* 96* 93*  --   --  91* 92*  CO2 24 28 28   --   --  36* 36*  GLUCOSE 158* 118* 105*  --   --  110* 110*  BUN 12 12 11   --   --  8 11  CREATININE 0.71 0.57 0.54  --   --  0.43* 0.45  CALCIUM 8.5* 8.2* 8.4*  --   --  8.4* 8.6*     CBC: Recent Labs  Lab 06/04/18 1430 06/05/18 0204 06/08/18 0427  WBC 4.7 3.2* 3.3*  NEUTROABS 2.4  --   --   HGB 13.3 11.2* 12.3  HCT 39.1 34.1* 36.7  MCV 93.7 95.9 92.6  PLT 176 142* 171     No results found for: HEPBSAG, HEPBSAB,  HEPBIGM    Microbiology:  Recent Results (from the past 240 hour(s))  MRSA PCR Screening     Status: None   Collection Time: 06/04/18  9:48 PM  Result Value Ref Range Status   MRSA by PCR NEGATIVE NEGATIVE Final    Comment:        The GeneXpert MRSA Assay (FDA approved for NASAL specimens only), is one component of a comprehensive MRSA colonization surveillance program. It is not intended to diagnose MRSA infection nor to guide or monitor treatment for MRSA infections. Performed at [facility], [address] [place], [place] [address]     Coagulation Studies: No results for input(s): LABPROT, INR in the last 72 hours.  Urinalysis: No results for input(s): COLORURINE, LABSPEC, PHURINE, GLUCOSEU, HGBUR, BILIRUBINUR, KETONESUR, PROTEINUR, UROBILINOGEN, NITRITE, LEUKOCYTESUR in the last 72 hours.  Invalid input(s): APPERANCEUR    Imaging: No results found.   Medications:    . aspirin EC  81 mg Oral Daily  . calcium-vitamin D  Oral Daily  . carvedilol  25 mg Oral BID WC  . fluticasone furoate-vilanterol  1 puff Inhalation Daily  . heparin  5,000 Units Subcutaneous Q8H  . hydrALAZINE  25 mg Oral Q8H  . pantoprazole  40 mg Oral Daily  . psyllium  1 packet Oral Daily  . raloxifene  60 mg Oral Daily  . ramipril  20 mg Oral BH-q7a  . tiotropium  18 mcg Inhalation Daily  . traZODone  50 mg Oral QHS  . vitamin C  500 mg Oral Daily   acetaminophen **OR** acetaminophen, albuterol, bisacodyl, HYDROcodone-acetaminophen, ondansetron **OR** ondansetron (ZOFRAN) IV  Assessment/ Plan:  84 y.o. female with hypertension, CHF, COPD, GERD, history of breast cancer, was admitted on 06/04/2018 for evaluation  Patient has history of chronic hyponatremia.  On March 08, 2018, sodium was normal at 137.  This time, her admit sodium is 127   1. Chronic hyponatremia Urine studies show osm of 613.  2D echo shows patient has low EF of 45% with diffuse hypokinesis.  Therefore,  hyponatremia is likely multifactorial with contributions from CHF and SIADH  TSH is normal at 3.920.Chest x-ray negative for any mass This level of hyponatremia is unlikely to be contributing to patient's falls at home 2.  Hypertension.  3.  Acute on chronic systolic heart failure exacerbation, EF 45%.  Plan: The patient's serum sodium is relatively stable at 132.  She had good diuretic response yesterday to Lasix.  Renal function remained stable with a creatinine of 0.45.  She is in the process of being qualified for home oxygen therapy.  Continue supportive care otherwise.     LOS: 2 [name] 7/10/20193:16 PM  [name], [name]  Note: This note was prepared with Dragon dictation. Any transcription errors are unintentional

## 2018-06-09 NOTE — Discharge Instructions (Signed)

## 2018-06-09 NOTE — Progress Notes (Signed)
Physical Therapy Treatment Patient Details Name: Melanie Huffman MRN: [id number] DOB: [birth year] Today's Date: 06/09/2018    History of Present Illness Patient is an 84 y/o female that presents with syncopal episode and multiple recent falls. Noted to be severely hyponatremic and slightly hyperkalemic.     PT Comments    Patient in bed at start of session with family at bedside, with abdominal pain, agreeable to therapy. Patient able to mobilized to EOB  and sit <> stand with slightly elevated bed with supervision. Patient ambulated 182ft with RW, CGA and O2 via [place]. Patient demonstrated decreased speed, 1-2 standing rest breaks to address SOB and spO2 levels. O2 via [place] at 2L for majority of ambulation, 3L for end of ambulation for spO2 to maintain SpO2 > 90%. The patient was returned to bed with all needs in reach. Time was spent educating family/patient about safety at home, role PT, expectations of PT, mobility status, and general concerns. The patient would benefit from further skilled PT to continue to address limitations in gait, balance, activity tolerance, endurance and to return patient to PLOF.   Follow Up Recommendations  Home health PT (24/7 supervision, family is available to help and has used an aide in the past).     Equipment Recommendations  Rolling walker with 5" wheels    Recommendations for Other Services       Precautions / Restrictions Precautions Precautions: Fall Restrictions Weight Bearing Restrictions: No    Mobility  Bed Mobility Overal bed mobility: Needs Assistance Bed Mobility: Supine to Sit     Supine to sit: Supervision Sit to supine: Supervision      Transfers Overall transfer level: Needs assistance Equipment used: Rolling walker (2 wheeled) Transfers: Sit to/from Stand Sit to Stand: Supervision         General transfer comment: Patient able to stand from slightly elevated bed  Ambulation/Gait Ambulation/Gait assistance: Min  guard Gait Distance (Feet): 180 Feet Assistive device: Rolling walker (2 wheeled) Gait Pattern/deviations: Step-through pattern     General Gait Details: Patient demonstrated decreased speed during ambulation. 1-2 standing rest breaks to address SOB spO2 levels. O2 via [place] at 2L for majority of ambulation, 3L for end of ambulation sp O2 greater than 90%.   Stairs             Wheelchair Mobility    Modified Rankin (Stroke Patients Only)       Balance Overall balance assessment: Needs assistance Sitting-balance support: Feet supported Sitting balance-Leahy Scale: Good     Standing balance support: Bilateral upper extremity supported Standing balance-Leahy Scale: Poor                              Cognition                                              Exercises Other Exercises Other Exercises: Time spent with patient and family educating about ambulation at home, activity tolerance, role of HH PT, safety at home    General Comments General comments (skin integrity, edema, etc.): Patient agreeable to therapy at start of session      Pertinent Vitals/Pain Pain Assessment: Faces Faces Pain Scale: Hurts a little bit Pain Location: Patient reports abdominal pain Pain Intervention(s): Repositioned;Monitored during session    Home Living  Prior Function            PT Goals (current goals can now be found in the care plan section) Acute Rehab PT Goals Patient Stated Goal: To return home safely PT Goal Formulation: With patient Time For Goal Achievement: 06/19/18 Potential to Achieve Goals: Good Progress towards PT goals: Progressing toward goals    Frequency    Min 2X/week      PT Plan Current plan remains appropriate    Co-evaluation              AM-PAC PT "6 Clicks" Daily Activity  Outcome Measure  Difficulty turning over in bed (including adjusting bedclothes, sheets and  blankets)?: A Little Difficulty moving from lying on back to sitting on the side of the bed? : A Little Difficulty sitting down on and standing up from a chair with arms (e.g., wheelchair, bedside commode, etc,.)?: A Little Help needed moving to and from a bed to chair (including a wheelchair)?: A Little Help needed walking in hospital room?: A Little Help needed climbing 3-5 steps with a railing? : A Lot 6 Click Score: 17    End of Session Equipment Utilized During Treatment: Gait belt Activity Tolerance: Patient tolerated treatment well Patient left: in bed;with family/visitor present;with call bell/phone within reach;with bed alarm set Nurse Communication: Mobility status PT Visit Diagnosis: Muscle weakness (generalized) (M62.81);Repeated falls (R29.6)     Time: 7106-2694 PT Time Calculation (min) (ACUTE ONLY): 32 min  Charges:  $Therapeutic Activity: 23-37 mins                    G Codes:       Lieutenant Diego PT, DPT 1:26 PM,06/09/18 [phone]

## 2018-06-09 NOTE — Care Management (Signed)
Spoke with Melanie Huffman, daughter of Melanie Huffman. Would like [facility] for home oxygen. Face sheet, order, History & Physical, and saturations faxed in ti [facility]. Chose [facility] for Home Health services. Spoke with Joelene Millin, [facility] representative. They do not accept [employer] insurance. Telephone call to [place], [facility] representative. Will accept as new patient. Daughter only wants physical theray, skilled nursing and aide Daughter will transport [name] RN MSN CCM Care Management [phone]

## 2018-06-11 DIAGNOSIS — I110 Hypertensive heart disease with heart failure: Secondary | ICD-10-CM | POA: Diagnosis not present

## 2018-06-11 DIAGNOSIS — Z7982 Long term (current) use of aspirin: Secondary | ICD-10-CM | POA: Diagnosis not present

## 2018-06-11 DIAGNOSIS — R55 Syncope and collapse: Secondary | ICD-10-CM | POA: Diagnosis not present

## 2018-06-11 DIAGNOSIS — I2510 Atherosclerotic heart disease of native coronary artery without angina pectoris: Secondary | ICD-10-CM | POA: Diagnosis not present

## 2018-06-11 DIAGNOSIS — K219 Gastro-esophageal reflux disease without esophagitis: Secondary | ICD-10-CM | POA: Diagnosis not present

## 2018-06-11 DIAGNOSIS — Z9012 Acquired absence of left breast and nipple: Secondary | ICD-10-CM | POA: Diagnosis not present

## 2018-06-11 DIAGNOSIS — I5022 Chronic systolic (congestive) heart failure: Secondary | ICD-10-CM | POA: Diagnosis not present

## 2018-06-11 DIAGNOSIS — Z9181 History of falling: Secondary | ICD-10-CM | POA: Diagnosis not present

## 2018-06-11 DIAGNOSIS — J449 Chronic obstructive pulmonary disease, unspecified: Secondary | ICD-10-CM | POA: Diagnosis not present

## 2018-06-11 DIAGNOSIS — R296 Repeated falls: Secondary | ICD-10-CM | POA: Diagnosis not present

## 2018-06-11 DIAGNOSIS — Z7951 Long term (current) use of inhaled steroids: Secondary | ICD-10-CM | POA: Diagnosis not present

## 2018-06-11 DIAGNOSIS — Z87891 Personal history of nicotine dependence: Secondary | ICD-10-CM | POA: Diagnosis not present

## 2018-06-11 DIAGNOSIS — F0390 Unspecified dementia without behavioral disturbance: Secondary | ICD-10-CM | POA: Diagnosis not present

## 2018-06-11 DIAGNOSIS — E871 Hypo-osmolality and hyponatremia: Secondary | ICD-10-CM | POA: Diagnosis not present

## 2018-06-11 DIAGNOSIS — Z853 Personal history of malignant neoplasm of breast: Secondary | ICD-10-CM | POA: Diagnosis not present

## 2018-06-15 DIAGNOSIS — R69 Illness, unspecified: Secondary | ICD-10-CM | POA: Diagnosis not present

## 2018-06-16 DIAGNOSIS — I5022 Chronic systolic (congestive) heart failure: Secondary | ICD-10-CM | POA: Diagnosis not present

## 2018-06-16 DIAGNOSIS — Z87898 Personal history of other specified conditions: Secondary | ICD-10-CM | POA: Diagnosis not present

## 2018-06-16 DIAGNOSIS — Z8709 Personal history of other diseases of the respiratory system: Secondary | ICD-10-CM | POA: Diagnosis not present

## 2018-06-16 DIAGNOSIS — J449 Chronic obstructive pulmonary disease, unspecified: Secondary | ICD-10-CM | POA: Diagnosis not present

## 2018-06-17 DIAGNOSIS — R69 Illness, unspecified: Secondary | ICD-10-CM | POA: Diagnosis not present

## 2018-06-18 DIAGNOSIS — Z9181 History of falling: Secondary | ICD-10-CM | POA: Diagnosis not present

## 2018-06-18 DIAGNOSIS — A4102 Sepsis due to Methicillin resistant Staphylococcus aureus: Secondary | ICD-10-CM | POA: Diagnosis not present

## 2018-06-18 DIAGNOSIS — R69 Illness, unspecified: Secondary | ICD-10-CM | POA: Diagnosis not present

## 2018-06-18 DIAGNOSIS — S383XXA Transection (partial) of abdomen, initial encounter: Secondary | ICD-10-CM | POA: Diagnosis not present

## 2018-06-19 DIAGNOSIS — R69 Illness, unspecified: Secondary | ICD-10-CM | POA: Diagnosis not present

## 2018-06-20 DIAGNOSIS — R69 Illness, unspecified: Secondary | ICD-10-CM | POA: Diagnosis not present

## 2018-06-21 DIAGNOSIS — R69 Illness, unspecified: Secondary | ICD-10-CM | POA: Diagnosis not present

## 2018-06-21 DIAGNOSIS — Z9889 Other specified postprocedural states: Secondary | ICD-10-CM | POA: Diagnosis not present

## 2018-06-21 DIAGNOSIS — Z8781 Personal history of (healed) traumatic fracture: Secondary | ICD-10-CM | POA: Diagnosis not present

## 2018-06-21 DIAGNOSIS — S82042D Displaced comminuted fracture of left patella, subsequent encounter for closed fracture with routine healing: Secondary | ICD-10-CM | POA: Diagnosis not present

## 2018-06-22 DIAGNOSIS — R69 Illness, unspecified: Secondary | ICD-10-CM | POA: Diagnosis not present

## 2018-06-23 DIAGNOSIS — J449 Chronic obstructive pulmonary disease, unspecified: Secondary | ICD-10-CM | POA: Diagnosis not present

## 2018-06-23 DIAGNOSIS — R69 Illness, unspecified: Secondary | ICD-10-CM | POA: Diagnosis not present

## 2018-06-24 DIAGNOSIS — R69 Illness, unspecified: Secondary | ICD-10-CM | POA: Diagnosis not present

## 2018-06-25 DIAGNOSIS — R69 Illness, unspecified: Secondary | ICD-10-CM | POA: Diagnosis not present

## 2018-06-26 DIAGNOSIS — R69 Illness, unspecified: Secondary | ICD-10-CM | POA: Diagnosis not present

## 2018-06-27 DIAGNOSIS — R69 Illness, unspecified: Secondary | ICD-10-CM | POA: Diagnosis not present

## 2018-06-28 DIAGNOSIS — R69 Illness, unspecified: Secondary | ICD-10-CM | POA: Diagnosis not present

## 2018-06-29 DIAGNOSIS — R69 Illness, unspecified: Secondary | ICD-10-CM | POA: Diagnosis not present

## 2018-06-30 DIAGNOSIS — R69 Illness, unspecified: Secondary | ICD-10-CM | POA: Diagnosis not present

## 2018-07-02 DIAGNOSIS — M79675 Pain in left toe(s): Secondary | ICD-10-CM | POA: Diagnosis not present

## 2018-07-02 DIAGNOSIS — M79674 Pain in right toe(s): Secondary | ICD-10-CM | POA: Diagnosis not present

## 2018-07-02 DIAGNOSIS — B351 Tinea unguium: Secondary | ICD-10-CM | POA: Diagnosis not present

## 2018-07-07 DIAGNOSIS — Z87891 Personal history of nicotine dependence: Secondary | ICD-10-CM | POA: Diagnosis not present

## 2018-07-07 DIAGNOSIS — Z853 Personal history of malignant neoplasm of breast: Secondary | ICD-10-CM | POA: Diagnosis not present

## 2018-07-07 DIAGNOSIS — I10 Essential (primary) hypertension: Secondary | ICD-10-CM | POA: Diagnosis not present

## 2018-07-07 DIAGNOSIS — I2510 Atherosclerotic heart disease of native coronary artery without angina pectoris: Secondary | ICD-10-CM | POA: Diagnosis not present

## 2018-07-07 DIAGNOSIS — J449 Chronic obstructive pulmonary disease, unspecified: Secondary | ICD-10-CM | POA: Diagnosis not present

## 2018-07-07 DIAGNOSIS — E871 Hypo-osmolality and hyponatremia: Secondary | ICD-10-CM | POA: Diagnosis not present

## 2018-07-07 DIAGNOSIS — F0390 Unspecified dementia without behavioral disturbance: Secondary | ICD-10-CM | POA: Diagnosis not present

## 2018-07-07 DIAGNOSIS — I5022 Chronic systolic (congestive) heart failure: Secondary | ICD-10-CM | POA: Diagnosis not present

## 2018-07-07 DIAGNOSIS — R55 Syncope and collapse: Secondary | ICD-10-CM | POA: Diagnosis not present

## 2018-07-07 DIAGNOSIS — K219 Gastro-esophageal reflux disease without esophagitis: Secondary | ICD-10-CM | POA: Diagnosis not present

## 2018-07-07 DIAGNOSIS — R296 Repeated falls: Secondary | ICD-10-CM | POA: Diagnosis not present

## 2018-07-07 DIAGNOSIS — Z9012 Acquired absence of left breast and nipple: Secondary | ICD-10-CM | POA: Diagnosis not present

## 2018-07-10 DIAGNOSIS — J9601 Acute respiratory failure with hypoxia: Secondary | ICD-10-CM | POA: Diagnosis not present

## 2018-07-10 DIAGNOSIS — J449 Chronic obstructive pulmonary disease, unspecified: Secondary | ICD-10-CM | POA: Diagnosis not present

## 2018-07-19 DIAGNOSIS — Z961 Presence of intraocular lens: Secondary | ICD-10-CM | POA: Diagnosis not present

## 2018-07-21 DIAGNOSIS — S82042A Displaced comminuted fracture of left patella, initial encounter for closed fracture: Secondary | ICD-10-CM | POA: Diagnosis not present

## 2018-07-21 DIAGNOSIS — I10 Essential (primary) hypertension: Secondary | ICD-10-CM | POA: Diagnosis not present

## 2018-07-22 DIAGNOSIS — R531 Weakness: Secondary | ICD-10-CM | POA: Diagnosis not present

## 2018-07-22 DIAGNOSIS — F411 Generalized anxiety disorder: Secondary | ICD-10-CM | POA: Diagnosis not present

## 2018-07-25 ENCOUNTER — Emergency Department: Payer: PPO

## 2018-07-25 ENCOUNTER — Other Ambulatory Visit: Payer: Self-pay

## 2018-07-25 ENCOUNTER — Encounter: Payer: Self-pay | Admitting: Emergency Medicine

## 2018-07-25 ENCOUNTER — Inpatient Hospital Stay
Admission: EM | Admit: 2018-07-25 | Discharge: 2018-07-27 | DRG: 291 | Disposition: A | Discharge status: Home Health | Payer: PPO | Attending: Internal Medicine | Admitting: Internal Medicine

## 2018-07-25 DIAGNOSIS — J9620 Acute and chronic respiratory failure, unspecified whether with hypoxia or hypercapnia: Secondary | ICD-10-CM | POA: Diagnosis not present

## 2018-07-25 DIAGNOSIS — F411 Generalized anxiety disorder: Secondary | ICD-10-CM | POA: Diagnosis present

## 2018-07-25 DIAGNOSIS — I739 Peripheral vascular disease, unspecified: Secondary | ICD-10-CM | POA: Diagnosis present

## 2018-07-25 DIAGNOSIS — K219 Gastro-esophageal reflux disease without esophagitis: Secondary | ICD-10-CM | POA: Diagnosis not present

## 2018-07-25 DIAGNOSIS — Z9012 Acquired absence of left breast and nipple: Secondary | ICD-10-CM

## 2018-07-25 DIAGNOSIS — R05 Cough: Secondary | ICD-10-CM | POA: Diagnosis not present

## 2018-07-25 DIAGNOSIS — Z66 Do not resuscitate: Secondary | ICD-10-CM | POA: Diagnosis present

## 2018-07-25 DIAGNOSIS — F0390 Unspecified dementia without behavioral disturbance: Secondary | ICD-10-CM | POA: Diagnosis present

## 2018-07-25 DIAGNOSIS — Z79899 Other long term (current) drug therapy: Secondary | ICD-10-CM

## 2018-07-25 DIAGNOSIS — Z7951 Long term (current) use of inhaled steroids: Secondary | ICD-10-CM

## 2018-07-25 DIAGNOSIS — R0602 Shortness of breath: Secondary | ICD-10-CM

## 2018-07-25 DIAGNOSIS — J9621 Acute and chronic respiratory failure with hypoxia: Secondary | ICD-10-CM | POA: Diagnosis not present

## 2018-07-25 DIAGNOSIS — Z87891 Personal history of nicotine dependence: Secondary | ICD-10-CM

## 2018-07-25 DIAGNOSIS — I5023 Acute on chronic systolic (congestive) heart failure: Secondary | ICD-10-CM | POA: Diagnosis not present

## 2018-07-25 DIAGNOSIS — Z90710 Acquired absence of both cervix and uterus: Secondary | ICD-10-CM | POA: Diagnosis not present

## 2018-07-25 DIAGNOSIS — E871 Hypo-osmolality and hyponatremia: Secondary | ICD-10-CM | POA: Diagnosis present

## 2018-07-25 DIAGNOSIS — J189 Pneumonia, unspecified organism: Secondary | ICD-10-CM

## 2018-07-25 DIAGNOSIS — Z853 Personal history of malignant neoplasm of breast: Secondary | ICD-10-CM | POA: Diagnosis not present

## 2018-07-25 DIAGNOSIS — J449 Chronic obstructive pulmonary disease, unspecified: Secondary | ICD-10-CM | POA: Diagnosis not present

## 2018-07-25 DIAGNOSIS — Z9981 Dependence on supplemental oxygen: Secondary | ICD-10-CM | POA: Diagnosis not present

## 2018-07-25 DIAGNOSIS — I10 Essential (primary) hypertension: Secondary | ICD-10-CM | POA: Diagnosis not present

## 2018-07-25 DIAGNOSIS — I509 Heart failure, unspecified: Secondary | ICD-10-CM | POA: Diagnosis not present

## 2018-07-25 DIAGNOSIS — I110 Hypertensive heart disease with heart failure: Principal | ICD-10-CM | POA: Diagnosis present

## 2018-07-25 DIAGNOSIS — Z7982 Long term (current) use of aspirin: Secondary | ICD-10-CM | POA: Diagnosis not present

## 2018-07-25 DIAGNOSIS — J439 Emphysema, unspecified: Secondary | ICD-10-CM | POA: Diagnosis not present

## 2018-07-25 LAB — CBC
HEMATOCRIT: 35.7 % (ref 35.0–47.0)
Hemoglobin: 12.2 g/dL (ref 12.0–16.0)
MCH: 30.9 pg (ref 26.0–34.0)
MCHC: 34.3 g/dL (ref 32.0–36.0)
MCV: 90.3 fL (ref 80.0–100.0)
Platelets: 155 10*3/uL (ref 150–440)
RBC: 3.95 MIL/uL (ref 3.80–5.20)
RDW: 15.3 % — ABNORMAL HIGH (ref 11.5–14.5)
WBC: 3.8 10*3/uL (ref 3.6–11.0)

## 2018-07-25 LAB — BASIC METABOLIC PANEL
ANION GAP: 5 (ref 5–15)
BUN: 10 mg/dL (ref 8–23)
CALCIUM: 8.7 mg/dL — AB (ref 8.9–10.3)
CO2: 31 mmol/L (ref 22–32)
Chloride: 94 mmol/L — ABNORMAL LOW (ref 98–111)
Creatinine, Ser: 0.55 mg/dL (ref 0.44–1.00)
GFR calc Af Amer: >60 mL/min (ref >60)
GFR calc non Af Amer: >60 mL/min (ref >60)
GLUCOSE: 108 mg/dL — AB (ref 70–99)
Potassium: 4.1 mmol/L (ref 3.5–5.1)
Sodium: 130 mmol/L — ABNORMAL LOW (ref 135–145)

## 2018-07-25 LAB — TROPONIN I

## 2018-07-25 MED ORDER — ALBUTEROL SULFATE (2.5 MG/3ML) 0.083% IN NEBU: 2.5000 mg | INHALATION_SOLUTION | RESPIRATORY_TRACT | Status: DC | PRN

## 2018-07-25 MED ORDER — ENOXAPARIN SODIUM 40 MG/0.4ML ~~LOC~~ SOLN
40.0000 mg | SUBCUTANEOUS | Status: DC
  Administered 2018-07-25 – 2018-07-26 (×2): 40 mg via SUBCUTANEOUS
  Filled 2018-07-25 (×2): qty 0.4

## 2018-07-25 MED ORDER — ALBUTEROL SULFATE (2.5 MG/3ML) 0.083% IN NEBU
INHALATION_SOLUTION | RESPIRATORY_TRACT | Status: AC
  Filled 2018-07-25: qty 6

## 2018-07-25 MED ORDER — RALOXIFENE HCL 60 MG PO TABS
60.0000 mg | ORAL_TABLET | Freq: Every day | ORAL | Status: DC
  Administered 2018-07-26 – 2018-07-27 (×2): 60 mg via ORAL
  Filled 2018-07-25 (×2): qty 1

## 2018-07-25 MED ORDER — CLONIDINE HCL 0.1 MG PO TABS
0.1000 mg | ORAL_TABLET | Freq: Two times a day (BID) | ORAL | Status: DC
  Administered 2018-07-25 – 2018-07-27 (×4): 0.1 mg via ORAL
  Filled 2018-07-25 (×4): qty 1

## 2018-07-25 MED ORDER — ONDANSETRON HCL 4 MG PO TABS: 4.0000 mg | ORAL_TABLET | Freq: Four times a day (QID) | ORAL | Status: DC | PRN

## 2018-07-25 MED ORDER — SODIUM CHLORIDE 0.9% FLUSH
3.0000 mL | Freq: Two times a day (BID) | INTRAVENOUS | Status: DC
  Administered 2018-07-25 – 2018-07-26 (×3): 3 mL via INTRAVENOUS

## 2018-07-25 MED ORDER — SODIUM CHLORIDE 0.9 % IV SOLN: 250.0000 mL | INTRAVENOUS | Status: DC | PRN

## 2018-07-25 MED ORDER — FUROSEMIDE 10 MG/ML IJ SOLN
40.0000 mg | Freq: Two times a day (BID) | INTRAMUSCULAR | Status: DC
  Administered 2018-07-26 – 2018-07-27 (×3): 40 mg via INTRAVENOUS
  Filled 2018-07-25 (×3): qty 4

## 2018-07-25 MED ORDER — PANTOPRAZOLE SODIUM 40 MG PO TBEC
40.0000 mg | DELAYED_RELEASE_TABLET | Freq: Every day | ORAL | Status: DC
  Administered 2018-07-26 – 2018-07-27 (×2): 40 mg via ORAL
  Filled 2018-07-25 (×2): qty 1

## 2018-07-25 MED ORDER — HYDRALAZINE HCL 25 MG PO TABS
25.0000 mg | ORAL_TABLET | Freq: Three times a day (TID) | ORAL | Status: DC
  Administered 2018-07-25 – 2018-07-27 (×5): 25 mg via ORAL
  Filled 2018-07-25 (×5): qty 1

## 2018-07-25 MED ORDER — FUROSEMIDE 10 MG/ML IJ SOLN
60.0000 mg | Freq: Once | INTRAMUSCULAR | Status: AC
  Administered 2018-07-25: 60 mg via INTRAVENOUS
  Filled 2018-07-25: qty 6

## 2018-07-25 MED ORDER — CARVEDILOL 25 MG PO TABS
25.0000 mg | ORAL_TABLET | Freq: Two times a day (BID) | ORAL | Status: DC
  Administered 2018-07-26 – 2018-07-27 (×3): 25 mg via ORAL
  Filled 2018-07-25 (×3): qty 1

## 2018-07-25 MED ORDER — ONDANSETRON HCL 4 MG/2ML IJ SOLN: 4.0000 mg | Freq: Four times a day (QID) | INTRAMUSCULAR | Status: DC | PRN

## 2018-07-25 MED ORDER — RAMIPRIL 10 MG PO CAPS
20.0000 mg | ORAL_CAPSULE | ORAL | Status: DC
  Filled 2018-07-25: qty 2

## 2018-07-25 MED ORDER — BISACODYL 5 MG PO TBEC: 5.0000 mg | DELAYED_RELEASE_TABLET | Freq: Every day | ORAL | Status: DC | PRN

## 2018-07-25 MED ORDER — ALBUTEROL SULFATE (2.5 MG/3ML) 0.083% IN NEBU
5.0000 mg | INHALATION_SOLUTION | Freq: Once | RESPIRATORY_TRACT | Status: AC
  Administered 2018-07-25: 5 mg via RESPIRATORY_TRACT

## 2018-07-25 MED ORDER — SENNOSIDES-DOCUSATE SODIUM 8.6-50 MG PO TABS: 1.0000 | ORAL_TABLET | Freq: Every evening | ORAL | Status: DC | PRN

## 2018-07-25 MED ORDER — TIOTROPIUM BROMIDE MONOHYDRATE 18 MCG IN CAPS
18.0000 ug | ORAL_CAPSULE | Freq: Every day | RESPIRATORY_TRACT | Status: DC
  Administered 2018-07-26 – 2018-07-27 (×2): 18 ug via RESPIRATORY_TRACT
  Filled 2018-07-25: qty 5

## 2018-07-25 MED ORDER — POTASSIUM CHLORIDE CRYS ER 20 MEQ PO TBCR
20.0000 meq | EXTENDED_RELEASE_TABLET | Freq: Every day | ORAL | Status: DC
  Administered 2018-07-26 – 2018-07-27 (×2): 20 meq via ORAL
  Filled 2018-07-25 (×2): qty 1

## 2018-07-25 MED ORDER — TRAZODONE HCL 50 MG PO TABS
50.0000 mg | ORAL_TABLET | Freq: Every day | ORAL | Status: DC
  Administered 2018-07-25 – 2018-07-26 (×2): 50 mg via ORAL
  Filled 2018-07-25 (×2): qty 1

## 2018-07-25 MED ORDER — FLUTICASONE FUROATE-VILANTEROL 100-25 MCG/INH IN AEPB
1.0000 | INHALATION_SPRAY | Freq: Every day | RESPIRATORY_TRACT | Status: DC
  Administered 2018-07-26 – 2018-07-27 (×2): 1 via RESPIRATORY_TRACT
  Filled 2018-07-25: qty 28

## 2018-07-25 MED ORDER — ORAL CARE MOUTH RINSE
15.0000 mL | Freq: Two times a day (BID) | OROMUCOSAL | Status: DC
  Administered 2018-07-26 – 2018-07-27 (×2): 15 mL via OROMUCOSAL

## 2018-07-25 MED ORDER — SODIUM CHLORIDE 0.9% FLUSH
3.0000 mL | INTRAVENOUS | Status: DC | PRN
  Administered 2018-07-27: 3 mL via INTRAVENOUS
  Filled 2018-07-25: qty 3

## 2018-07-25 NOTE — ED Notes (Signed)
Patient transported to X-ray 

## 2018-07-25 NOTE — H&P (Signed)
[name] NAME: Melanie Huffman    MR#:  [id number]  DATE OF BIRTH:  17-Oct-1928  DATE OF ADMISSION:  07/25/2018  PRIMARY CARE PHYSICIAN: Dion Body, MD   REQUESTING/REFERRING PHYSICIAN: Harvest Dark, MD  CHIEF COMPLAINT:   Chief Complaint  Patient presents with  . Shortness of Breath   Shortness of breath today. HISTORY OF PRESENT ILLNESS:  Melanie Huffman  is a 84 y.o. female with a known history of multiple medical problems including chronic respiratory failure on home O2 [place] 2.5 L, CHF, COPD, HTN, breast cancer. She presents to the emergency department for shortness of breath today. She used home nebulizer without improvement.  She also complains of bilateral leg swelling.  She was found hypoxia at 80s% on exertion in the ED.  Chest x-ray show pulmonary edema.  PAST MEDICAL HISTORY:   Past Medical History:  Diagnosis Date  . Arthritis 2019   mostly in neck  . Asthma   . Breast cancer ([place]) 1991   mastectomy left  . Cancer ([place]) 1991   L Breast  . CHF (congestive heart failure) ([place])   . COPD (chronic obstructive pulmonary disease) ([place])    hypoxia with resp failure admission in 03/2018  . Dementia    confusion beginning  . Emphysema of lung ([place])   . GERD (gastroesophageal reflux disease)   . Heart murmur   . History of hiatal hernia   . Hypertension 03/2018   recent hypertension urgency ..admitted in april 2019  . Hypoxia 03/2018   O2 saturation runs 86-91% on Room Air.  . Intermittent dysphagia   . Peripheral vascular disease ([place])   . Shortness of breath dyspnea   . Traumatic subdural hematoma (H[facility])     PAST SURGICAL HISTORY:   Past Surgical History:  Procedure Laterality Date  . ABDOMINAL HYSTERECTOMY     Partial  . APPENDECTOMY    . BREAST BIOPSY Right    1980 and 1970's  . BREAST SURGERY  1991   L Mastectomy  . EYE SURGERY Bilateral    cataract extraction  . MASTECTOMY    . ORIF  PATELLA Left 04/12/2015   Procedure: OPEN REDUCTION INTERNAL (ORIF) FIXATION PATELLA;  Surgeon: Hessie Knows, MD;  Location: [facility] ORS;  Service: Orthopedics;  Laterality: Left;  . ORIF WRIST FRACTURE Right 05/04/2018   Procedure: OPEN REDUCTION INTERNAL FIXATION (ORIF) WRIST FRACTURE;  Surgeon: Hessie Knows, MD;  Location: [facility] ORS;  Service: Orthopedics;  Laterality: Right;  . PERIPHERAL VASCULAR CATHETERIZATION Left 04/17/2015   Procedure: Lower Extremity Angiography;  Surgeon: Katha Cabal, MD;  Location: [facility];  Service: Cardiovascular;  Laterality: Left;  . PERIPHERAL VASCULAR CATHETERIZATION  04/17/2015   Procedure: Lower Extremity Intervention;  Surgeon: Katha Cabal, MD;  Location: [facility];  Service: Cardiovascular;;  . TONSILLECTOMY      SOCIAL HISTORY:   Social History   Tobacco Use  . Smoking status: Former Smoker    Packs/day: 0.50    Years: 20.00    Pack years: 10.00    Types: Cigarettes    Last attempt to quit: 01/26/1979    Years since quitting: 39.5  . Smokeless tobacco: Never Used  Substance Use Topics  . Alcohol use: No    FAMILY HISTORY:   Family History  Problem Relation Age of Onset  . CVA Mother   . Kidney disease Mother   . Heart disease Father   .  Kidney disease Cousin 52  . Kidney disease Cousin 15  . Breast cancer Neg Hx     DRUG ALLERGIES:   Allergies  Allergen Reactions  . Amlodipine Swelling     [Onset: 08/26/2013]feet and neck begin to swell  . Adhesive [Tape] Other (See Comments)    Skin is very thin and tears easily. Please use paper tape only  . Codeine Nausea Only  . Percocet [Oxycodone-Acetaminophen] Nausea And Vomiting    Patient took on an empty stomach. Good relief but unsettled. Still has not eaten today  . Sulfa Antibiotics Nausea Only and Rash    rash    REVIEW OF SYSTEMS:   Review of Systems  Constitutional: Positive for malaise/fatigue. Negative for chills and fever.  HENT: Negative  for sore throat.   Eyes: Negative for blurred vision and double vision.  Respiratory: Positive for cough, sputum production and shortness of breath. Negative for hemoptysis, wheezing and stridor.   Cardiovascular: Positive for leg swelling. Negative for chest pain, palpitations and orthopnea.  Gastrointestinal: Negative for abdominal pain, blood in stool, diarrhea, melena, nausea and vomiting.  Genitourinary: Negative for dysuria, flank pain and hematuria.  Musculoskeletal: Negative for back pain and joint pain.  Skin: Negative for rash.  Neurological: Negative for dizziness, sensory change, focal weakness, seizures, loss of consciousness, weakness and headaches.  Endo/Heme/Allergies: Negative for polydipsia.  Psychiatric/Behavioral: Negative for depression. The patient is nervous/anxious.     MEDICATIONS AT HOME:   Prior to Admission medications   Medication Sig Start Date End Date Taking? Authorizing Provider  acetaminophen (TYLENOL) 325 MG tablet Take 2 tablets (650 mg total) by mouth every 6 (six) hours as needed for mild pain (or Fever >/= 101). 06/09/18  Yes Wieting, Richard, MD  albuterol (PROVENTIL HFA;VENTOLIN HFA) 108 (90 Base) MCG/ACT inhaler Inhale 2 puffs into the lungs every 6 (six) hours as needed for wheezing or shortness of breath.   Yes [provider]  Calcium Carbonate-Vitamin D3 (CALCIUM 600/VITAMIN D) 600-400 MG-UNIT TABS Take 1 tablet by mouth daily.   Yes [provider]  carvedilol (COREG) 25 MG tablet Take 25 mg by mouth 2 (two) times daily with a meal.   Yes [provider]  cloNIDine (CATAPRES) 0.1 MG tablet Take 0.1 mg by mouth 2 (two) times daily. 07/20/18  Yes [provider]  fluticasone furoate-vilanterol (BREO ELLIPTA) 100-25 MCG/INH AEPB Inhale 1 puff into the lungs daily.   Yes [provider]  furosemide (LASIX) 20 MG tablet Take 1 tablet (20 mg total) by mouth daily. 03/09/18 03/09/19 Yes Pyreddy, Reatha Harps, MD    hydrALAZINE (APRESOLINE) 25 MG tablet Take 1 tablet (25 mg total) by mouth every 8 (eight) hours. 06/09/18  Yes Wieting, Richard, MD  Multiple Vitamins-Minerals (CENTRUM SILVER PO) Take 1 tablet by mouth daily.   Yes [provider]  omeprazole (PRILOSEC) 20 MG capsule Take 20 mg by mouth daily.    Yes [provider]  potassium chloride SA (K-DUR,KLOR-CON) 20 MEQ tablet Take 1 tablet (20 mEq total) by mouth daily. 03/09/18  Yes Pyreddy, Reatha Harps, MD  Psyllium (METAMUCIL FIBER PO) Take 5 capsules by mouth 3 (three) times daily as needed.    Yes [provider]  raloxifene (EVISTA) 60 MG tablet Take 60 mg by mouth daily.   Yes [provider]  ramipril (ALTACE) 10 MG capsule Take 20 mg by mouth every morning.   Yes [provider]  tiotropium (SPIRIVA) 18 MCG inhalation capsule Place 18 mcg into  inhaler and inhale daily.    Yes [provider]  vitamin C (ASCORBIC ACID) 500 MG tablet Take 500 mg by mouth daily.   Yes [provider]  aspirin EC 81 MG tablet Take 1 tablet (81 mg total) by mouth daily. Patient not taking: Reported on 06/05/2018 03/09/18   Saundra Shelling, MD      VITAL SIGNS:  Blood pressure (!) 172/95, pulse 67, temperature 98 F (36.7 C), temperature source Oral, resp. rate 20, height 5\' 4"  (1.626 m), weight 54 kg, SpO2 95 %.  PHYSICAL EXAMINATION:  Physical Exam  GENERAL:  84 y.o.-year-old patient lying in the bed with no acute distress.  EYES: Pupils equal, round, reactive to light and accommodation. No scleral icterus. Extraocular muscles intact.  HEENT: Head atraumatic, normocephalic. Oropharynx and nasopharynx clear.  NECK:  Supple, no jugular venous distention. No thyroid enlargement, no tenderness.  LUNGS: Diminished breath sounds bilaterally, no wheezing, letter basilar rales, no rhonchi or crepitation. No use of accessory muscles of respiration.  CARDIOVASCULAR: S1, S2 normal. No murmurs, rubs, or gallops.   ABDOMEN: Soft, nontender, nondistended. Bowel sounds present. No organomegaly or mass.  EXTREMITIES: No cyanosis, or clubbing.  Bilateral leg edema. NEUROLOGIC: Cranial nerves II through XII are intact. Muscle strength 5/5 in all extremities. Sensation intact. Gait not checked.  PSYCHIATRIC: The patient is alert and oriented x 3.  SKIN: No obvious rash, lesion, or ulcer.   LABORATORY PANEL:   CBC Recent Labs  Lab 07/25/18 1723  WBC 3.8  HGB 12.2  HCT 35.7  PLT 155   ------------------------------------------------------------------------------------------------------------------  Chemistries  Recent Labs  Lab 07/25/18 1723  NA 130*  K 4.1  CL 94*  CO2 31  GLUCOSE 108*  BUN 10  CREATININE 0.55  CALCIUM 8.7*   ------------------------------------------------------------------------------------------------------------------  Cardiac Enzymes Recent Labs  Lab 07/25/18 1723  TROPONINI <0.03   ------------------------------------------------------------------------------------------------------------------  RADIOLOGY:  Dg Chest 2 View  Result Date: 07/25/2018 CLINICAL DATA:  Shortness of breath, cough, and wheezing. EXAM: CHEST - 2 VIEW COMPARISON:  June 07, 2018 FINDINGS: Diffuse interstitial markings. Small to moderate pleural effusions. Cardiomegaly. No other change. IMPRESSION: Findings most consistent with cardiomegaly and mild pulmonary edema/effusions. Electronically Signed   By: Dorise Bullion III M.D   On: 07/25/2018 17:51      IMPRESSION AND PLAN:   Acute on chronic respiratory failure due to acute on chronic systolic CHF. The patient will be admitted to medical floor with telemetry monitor. Start CHF protocol, Lasix 40 mg IV twice daily, echocardiograph this year showed ejection fraction 45%.  Cardiology consult.  Hyponatremia.  Follow-up sodium level whilel on Lasix. COPD.  Stable.  Continue home nebulizer. Hypertension.  Continue home hypertension  medication. All the records are reviewed and case discussed with ED provider. Management plans discussed with the patient, family and they are in agreement.  CODE STATUS: DNR.  TOTAL TIME TAKING CARE OF THIS PATIENT: 42 minutes.    Demetrios Loll M.D on 07/25/2018 at 7:34 PM  Between 7am to 6pm - Pager - [phone]  After 6pm go to [email or web address] - Proofreader  [facility]  Office  [phone]  CC: Primary care physician; Dion Body, MD   Note: This dictation was prepared with Dragon dictation along with smaller phrase technology. Any transcriptional errors that result from this process are unin

## 2018-07-25 NOTE — ED Provider Notes (Signed)
[facility] Emergency Department Provider Note  Time seen: 6:03 PM  I have reviewed the triage vital signs and the nursing notes.   HISTORY  Chief Complaint Shortness of Breath    HPI Melanie Huffman is a 84 y.o. female with a past medical history of CHF, COPD on 2.5 L of oxygen 24/7, dementia, hypertension, presents to the emergency department for shortness of breath.  Patient states she is feeling a little short of breath overnight states she had to wake up and take her albuterol inhaler but was able to go back to sleep.  States this afternoon she once again developed shortness of breath, patient states she thinks she "panicked" and made it worse.  States since receiving a breathing treatment in the emergency department she feels much better and denies any shortness of breath at this time.  Denies any chest pain at any point.  Denies any increased cough or sputum production.  No fever.  States she feels fairly normal currently.   Past Medical History:  Diagnosis Date  . Arthritis 2019   mostly in neck  . Asthma   . Breast cancer ([place]) 1991   mastectomy left  . Cancer ([place]) 1991   L Breast  . CHF (congestive heart failure) ([place])   . COPD (chronic obstructive pulmonary disease) ([place])    hypoxia with resp failure admission in 03/2018  . Dementia    confusion beginning  . Emphysema of lung ([place])   . GERD (gastroesophageal reflux disease)   . Heart murmur   . History of hiatal hernia   . Hypertension 03/2018   recent hypertension urgency ..admitted in april 2019  . Hypoxia 03/2018   O2 saturation runs 86-91% on Room Air.  . Intermittent dysphagia   . Peripheral vascular disease ([place])   . Shortness of breath dyspnea   . Traumatic subdural hematoma [facility])     Patient Active Problem List   Diagnosis Date Noted  . Acute respiratory failure with hypoxia ([place]) 06/07/2018  . Syncope 06/04/2018  . Accelerated hypertension 03/07/2018  . Acute systolic CHF  (congestive heart failure) ([place]) 03/07/2018  . Swelling of limb 09/03/2017  . Mass of upper lobe of left lung 11/09/2016  . Uncontrolled hypertension 10/31/2016  . PVD (peripheral vascular disease) ([place]) 10/31/2016  . COPD (chronic obstructive pulmonary disease) ([place]) 10/31/2016  . GERD (gastroesophageal reflux disease) 10/31/2016  . Hyponatremia 07/10/2016  . Dehydration 07/10/2016  . Acute renal insufficiency 07/10/2016  . Hypokalemia 07/10/2016  . Cellulitis of leg, left 07/09/2016  . Patellar fracture 04/11/2015  . Subdural hematoma ([place]) 07/26/2014    Past Surgical History:  Procedure Laterality Date  . ABDOMINAL HYSTERECTOMY     Partial  . APPENDECTOMY    . BREAST BIOPSY Right    1980 and 1970's  . BREAST SURGERY  1991   L Mastectomy  . EYE SURGERY Bilateral    cataract extraction  . MASTECTOMY    . ORIF PATELLA Left 04/12/2015   Procedure: OPEN REDUCTION INTERNAL (ORIF) FIXATION PATELLA;  Surgeon: Hessie Knows, MD;  Location: [facility] ORS;  Service: Orthopedics;  Laterality: Left;  . ORIF WRIST FRACTURE Right 05/04/2018   Procedure: OPEN REDUCTION INTERNAL FIXATION (ORIF) WRIST FRACTURE;  Surgeon: Hessie Knows, MD;  Location: [facility] ORS;  Service: Orthopedics;  Laterality: Right;  . PERIPHERAL VASCULAR CATHETERIZATION Left 04/17/2015   Procedure: Lower Extremity Angiography;  Surgeon: Katha Cabal, MD;  Location: [place] [facility];  Service: Cardiovascular;  Laterality: Left;  .  PERIPHERAL VASCULAR CATHETERIZATION  04/17/2015   Procedure: Lower Extremity Intervention;  Surgeon: Katha Cabal, MD;  Location: [facility];  Service: Cardiovascular;;  . TONSILLECTOMY      Prior to Admission medications   Medication Sig Start Date End Date Taking? Authorizing Provider  acetaminophen (TYLENOL) 325 MG tablet Take 2 tablets (650 mg total) by mouth every 6 (six) hours as needed for mild pain (or Fever >/= 101). 06/09/18   Loletha Grayer, MD  albuterol (PROVENTIL  HFA;VENTOLIN HFA) 108 (90 Base) MCG/ACT inhaler Inhale 2 puffs into the lungs every 6 (six) hours as needed for wheezing or shortness of breath.    [provider]  aspirin EC 81 MG tablet Take 1 tablet (81 mg total) by mouth daily. Patient not taking: Reported on 06/05/2018 03/09/18   Saundra Shelling, MD  Calcium Carbonate-Vitamin D3 (CALCIUM 600/VITAMIN D) 600-400 MG-UNIT TABS Take 1 tablet by mouth daily.    [provider]  carvedilol (COREG) 25 MG tablet Take 25 mg by mouth 2 (two) times daily with a meal.    [provider]  fluticasone furoate-vilanterol (BREO ELLIPTA) 100-25 MCG/INH AEPB Inhale 1 puff into the lungs daily.    [provider]  furosemide (LASIX) 20 MG tablet Take 1 tablet (20 mg total) by mouth daily. 03/09/18 03/09/19  Saundra Shelling, MD  GLUCOSAMINE HCL PO Take 1 tablet by mouth daily.    [provider]  hydrALAZINE (APRESOLINE) 25 MG tablet Take 1 tablet (25 mg total) by mouth every 8 (eight) hours. 06/09/18   Loletha Grayer, MD  Multiple Vitamins-Minerals (CENTRUM SILVER PO) Take 1 tablet by mouth daily.    [provider]  omeprazole (PRILOSEC) 20 MG capsule Take 20 mg by mouth daily.     [provider]  potassium chloride SA (K-DUR,KLOR-CON) 20 MEQ tablet Take 1 tablet (20 mEq total) by mouth daily. 03/09/18   Saundra Shelling, MD  Psyllium (METAMUCIL FIBER PO) Take 5 capsules by mouth 3 (three) times daily.    [provider]  raloxifene (EVISTA) 60 MG tablet Take 60 mg by mouth daily.    [provider]  ramipril (ALTACE) 10 MG capsule Take 20 mg by mouth every morning.    [provider]  tiotropium (SPIRIVA) 18 MCG inhalation capsule Place 18 mcg into inhaler and inhale daily.     [provider]  traZODone (DESYREL) 50 MG tablet Take 50 mg by mouth at bedtime.    [provider]  vitamin C (ASCORBIC ACID) 500 MG tablet Take 500 mg by mouth daily.    [provider]    Allergies  Allergen Reactions  . Amlodipine Swelling     [Onset: 08/26/2013]feet and neck begin to swell  . Adhesive [Tape] Other (See Comments)    Skin is very thin and tears easily. Please use paper tape only  . Codeine Nausea Only  . Percocet [Oxycodone-Acetaminophen] Nausea And Vomiting    Patient took on an empty stomach. Good relief but unsettled. Still has not eaten today  . Sulfa Antibiotics Nausea Only and Rash    rash    Family History  Problem Relation Age of Onset  . CVA Mother   . Kidney disease Mother   . Heart disease Father   . Kidney disease Cousin 22  . Kidney disease Cousin 33  . Breast cancer Neg Hx     Social History Social History   Tobacco Use  . Smoking status: Former  Smoker    Packs/day: 0.50    Years: 20.00    Pack years: 10.00    Types: Cigarettes    Last attempt to quit: 01/26/1979    Years since quitting: 39.5  . Smokeless tobacco: Never Used  Substance Use Topics  . Alcohol use: No  . Drug use: No    Review of Systems Constitutional: Negative for fever. Cardiovascular: Negative for chest pain. Respiratory: Positive for shortness of breath last night and this afternoon, since resolved. Gastrointestinal: Negative for abdominal pain, vomiting  Musculoskeletal: States slight swelling in her ankles, chronic Skin: Negative for skin complaints  Neurological: Negative for headache All other ROS negative  ____________________________________________   PHYSICAL EXAM:  VITAL SIGNS: ED Triage Vitals  Enc Vitals Group     BP 07/25/18 1701 (!) 172/95     Pulse Rate 07/25/18 1701 67     Resp 07/25/18 1701 18     Temp 07/25/18 1701 98 F (36.7 C)     Temp Source 07/25/18 1701 Oral     SpO2 07/25/18 1701 90 %     Weight 07/25/18 1702 119 lb (54 kg)     Height 07/25/18 1702 5\' 4"  (1.626 m)     Head Circumference --      Peak Flow --      Pain Score 07/25/18 1712 0     Pain Loc --      Pain Edu? --      Excl. in [place]?  --     Constitutional: Alert and oriented. Well appearing and in no distress. Eyes: Normal exam ENT   Head: Normocephalic and atraumatic.   Mouth/Throat: Mucous membranes are moist. Cardiovascular: Normal rate, regular rhythm. No murmur Respiratory: Normal respiratory effort without tachypnea nor retractions. Breath sounds are clear Gastrointestinal: Soft and nontender. No distention.   Musculoskeletal: Nontender with normal range of motion in all extremities. Neurologic:  Normal speech and language. No gross focal neurologic deficits  Skin:  Skin is warm, dry and intact.  Psychiatric: Mood and affect are normal.   ____________________________________________    EKG  EKG reviewed and interpreted by myself shows sinus rhythm at 74 bpm with a narrow QRS, normal axis, normal intervals, no ST changes.  ____________________________________________    RADIOLOGY  Chest x-ray shows mild edema  ____________________________________________   INITIAL IMPRESSION / ASSESSMENT AND PLAN / ED COURSE  Pertinent labs & imaging results that were available during my care of the patient were reviewed by me and considered in my medical decision making (see chart for details).  Patient presents to the emergency department for acute onset of shortness of breath last night and then again this afternoon.  Patient states she thinks she panicked when she could not catch her breath.  Currently satting 98% on 2.5 L.  Differential would include COPD exacerbation, CHF exacerbation, anxiety, URI, pneumonia, pneumothorax.  We will check labs, chest x-ray.  Patient's EKG is reassuring.  Labs are overall reassuring with a negative troponin.  Slight hyponatremia however largely unchanged from baseline.  Patient's chest x-ray does show mild edema with what appears to be mild pleural effusions.  I have personally reviewed the images as well.  I discussed with the patient her options of being discharged with  an increase Lasix dose first being admitted to the hospital.  Patient desats to 83% with ambulation.  We will dose IV Lasix and admit to the hospitalist service.  ____________________________________________   FINAL CLINICAL IMPRESSION(S) / ED DIAGNOSES  Dyspnea  CHF exacerbation   Harvest Dark, MD 07/25/18 Einar [place]

## 2018-07-25 NOTE — ED Notes (Signed)
Pt's Osat dropped to 84 during ambulation

## 2018-07-25 NOTE — Progress Notes (Signed)
Advanced Care Plan.  Purpose of Encounter: CODE STATUS. Parties in Attendance: The patient and me. Patient's Decisional Capacity: Yes. Medical Story: Melanie Huffman  is a 84 y.o. female with a known history of multiple medical problems including chronic respiratory failure on home O2 [place] 2.5 L, CHF, COPD, HTN, breast cancer. She presents to the emergency department for shortness of breath today.  She is being admitted for acute on chronic respiratory failure with hypoxia due to acute on chronic systolic CHF.  I discussed with the patient about her current condition, prognosis and CODE STATUS.  She said she does not want to be resuscitated or intubated.  Plan:  Code Status: DNR. Time spent discussing advance care planning: 17 minutes.

## 2018-07-25 NOTE — ED Triage Notes (Signed)
Pt presents to ED via POV c/o SOB starting last night. Pt states she is normally on 3L O2 at home. Sat in triage 95% on 3L. C/o non-productive cough and wheezing. Has been using inhalers at home without improvement.

## 2018-07-26 LAB — BASIC METABOLIC PANEL
ANION GAP: 8 (ref 5–15)
BUN: 11 mg/dL (ref 8–23)
CHLORIDE: 94 mmol/L — AB (ref 98–111)
CO2: 32 mmol/L (ref 22–32)
Calcium: 8.3 mg/dL — ABNORMAL LOW (ref 8.9–10.3)
Creatinine, Ser: 0.6 mg/dL (ref 0.44–1.00)
GFR calc non Af Amer: >60 mL/min (ref >60)
Glucose, Bld: 85 mg/dL (ref 70–99)
POTASSIUM: 3.5 mmol/L (ref 3.5–5.1)
Sodium: 134 mmol/L — ABNORMAL LOW (ref 135–145)

## 2018-07-26 LAB — MAGNESIUM: MAGNESIUM: 1.9 mg/dL (ref 1.7–2.4)

## 2018-07-26 MED ORDER — POTASSIUM CHLORIDE CRYS ER 20 MEQ PO TBCR
40.0000 meq | EXTENDED_RELEASE_TABLET | Freq: Once | ORAL | Status: AC
  Administered 2018-07-26: 40 meq via ORAL
  Filled 2018-07-26: qty 2

## 2018-07-26 MED ORDER — RAMIPRIL 10 MG PO CAPS
20.0000 mg | ORAL_CAPSULE | ORAL | Status: DC
  Administered 2018-07-26 – 2018-07-27 (×2): 20 mg via ORAL
  Filled 2018-07-26: qty 2

## 2018-07-26 MED ORDER — KETOROLAC TROMETHAMINE 30 MG/ML IJ SOLN
15.0000 mg | Freq: Four times a day (QID) | INTRAMUSCULAR | Status: DC | PRN
  Administered 2018-07-26: 15 mg via INTRAVENOUS
  Filled 2018-07-26: qty 1

## 2018-07-26 NOTE — Consult Note (Signed)
   [facility] CM Inpatient Consult   07/26/2018  Melanie Huffman 10/14/1928 [id number]    Referral received from inpatient [facility] for [name] [facility] program.   Telephone made to speak with patient to explain and offer [place] Management program services. She is agreeable and verbal consent obtained.  Confirmed best contact number as [phone]. Denies having any concerns with transportation or medication. PCP office at [facility] is listed as doing transition of care calls.  Explained that [place] [facility] will not interfere or replace services provided by home health.   Will make referral to [facility] for CHF. [name] has unplanned readmission risk score of 23% (high) and has had multiple hospitalizations.  Will make inpatient RNCM aware [facility] will follow.   Marthenia Rolling, MSN-Ed, RN,BSN [name] Liaison [phone]

## 2018-07-26 NOTE — Progress Notes (Signed)
Patient is stable this shift,IV lasix continues with strict intake and output,hypokalemic and potassium replaced,vital signs within normal limits.

## 2018-07-26 NOTE — Consult Note (Signed)
[facility]  CARDIOLOGY CONSULT NOTE  Patient ID: Melanie Huffman MRN: [id number] DOB/AGE: [birth year] 84 y.o.  Admit date: 07/25/2018 Referring Physician Bridgett Larsson Primary Physician [facility] Primary Cardiologist Fath Reason for Consultation Acute on chronic systolic CHF  HPI: 84 year old female referred for acute on chronic systolic congestive heart failure.  The patient has a history of severe COPD on chronic supplemental oxygen, essential hypertension, and generalized anxiety.  The patient reports a one month history of overall feeling unwell with no energy, with progressive exertional dyspnea, and mild peripheral edema.  She states that she woke up overnight feeling short of breath, used her albuterol inhaler, but was unable to go back to sleep.  She presented to [facility] ER for further evaluation.  She received a breathing treatment, with overall improvement. While ambulating, however, her oxygen saturations dropped to 83% while on oxygen.  ECG revealed sinus rhythm at a rate of 74 bpm without acute ST or T wave abnormalities.  Chest x-ray revealed cardiomegaly with mild pulmonary edema/effusions.  Patient was started on IV Lasix.  Admission labs notable for sodium 130, which is close to her baseline, and troponin less than 0.03. 2D echocardiogram on 06/05/2018 revealed LVEF 45% with diffuse hypokinesis with mild aortic and mitral regurgitation, with moderate pulmonary hypertension. Currently, she reports feeling somewhat better. She denies chest pain. She has chronic exertional dyspnea.  Review of systems complete and found to be negative unless listed above     Past Medical History:  Diagnosis Date  . Arthritis 2019   mostly in neck  . Asthma   . Breast cancer ([place]) 1991   mastectomy left  . Cancer ([place]) 1991   L Breast  . CHF (congestive heart failure) ([place])   . COPD (chronic obstructive pulmonary disease) ([place])    hypoxia with resp failure admission in 03/2018  . Dementia    confusion  beginning  . Emphysema of lung ([place])   . GERD (gastroesophageal reflux disease)   . Heart murmur   . History of hiatal hernia   . Hypertension 03/2018   recent hypertension urgency ..admitted in april 2019  . Hypoxia 03/2018   O2 saturation runs 86-91% on Room Air.  . Intermittent dysphagia   . Peripheral vascular disease ([place])   . Shortness of breath dyspnea   . Traumatic subdural hematoma [facility])     Past Surgical History:  Procedure Laterality Date  . ABDOMINAL HYSTERECTOMY     Partial  . APPENDECTOMY    . BREAST BIOPSY Right    1980 and 1970's  . BREAST SURGERY  1991   L Mastectomy  . EYE SURGERY Bilateral    cataract extraction  . MASTECTOMY    . ORIF PATELLA Left 04/12/2015   Procedure: OPEN REDUCTION INTERNAL (ORIF) FIXATION PATELLA;  Surgeon: Hessie Knows, MD;  Location: [facility] ORS;  Service: Orthopedics;  Laterality: Left;  . ORIF WRIST FRACTURE Right 05/04/2018   Procedure: OPEN REDUCTION INTERNAL FIXATION (ORIF) WRIST FRACTURE;  Surgeon: Hessie Knows, MD;  Location: [facility] ORS;  Service: Orthopedics;  Laterality: Right;  . PERIPHERAL VASCULAR CATHETERIZATION Left 04/17/2015   Procedure: Lower Extremity Angiography;  Surgeon: Katha Cabal, MD;  Location: [place] [facility];  Service: Cardiovascular;  Laterality: Left;  . PERIPHERAL VASCULAR CATHETERIZATION  04/17/2015   Procedure: Lower Extremity Intervention;  Surgeon: Katha Cabal, MD;  Location: [place] [facility];  Service: Cardiovascular;;  . TONSILLECTOMY      Medications Prior to Admission  Medication Sig Dispense Refill  Last Dose  . acetaminophen (TYLENOL) 325 MG tablet Take 2 tablets (650 mg total) by mouth every 6 (six) hours as needed for mild pain (or Fever >/= 101).   PRN at PRN  . albuterol (PROVENTIL HFA;VENTOLIN HFA) 108 (90 Base) MCG/ACT inhaler Inhale 2 puffs into the lungs every 6 (six) hours as needed for wheezing or shortness of breath.   PRN at PRN  . Calcium Carbonate-Vitamin D3 (CALCIUM  600/VITAMIN D) 600-400 MG-UNIT TABS Take 1 tablet by mouth daily.   07/25/2018 at Unknown time  . carvedilol (COREG) 25 MG tablet Take 25 mg by mouth 2 (two) times daily with a meal.   07/25/2018 at 0730  . cloNIDine (CATAPRES) 0.1 MG tablet Take 0.1 mg by mouth 2 (two) times daily.  1 07/25/2018 at 0730  . fluticasone furoate-vilanterol (BREO ELLIPTA) 100-25 MCG/INH AEPB Inhale 1 puff into the lungs daily.   07/25/2018 at 0730  . furosemide (LASIX) 20 MG tablet Take 1 tablet (20 mg total) by mouth daily. 30 tablet 0 07/25/2018 at 0730  . hydrALAZINE (APRESOLINE) 25 MG tablet Take 1 tablet (25 mg total) by mouth every 8 (eight) hours. 90 tablet 0 07/25/2018 at 1300  . Multiple Vitamins-Minerals (CENTRUM SILVER PO) Take 1 tablet by mouth daily.   07/25/2018 at 0730  . omeprazole (PRILOSEC) 20 MG capsule Take 20 mg by mouth daily.    07/25/2018 at 0730  . potassium chloride SA (K-DUR,KLOR-CON) 20 MEQ tablet Take 1 tablet (20 mEq total) by mouth daily. 30 tablet 0 07/25/2018 at 0730  . Psyllium (METAMUCIL FIBER PO) Take 5 capsules by mouth 3 (three) times daily as needed.    07/25/2018 at Unknown time  . raloxifene (EVISTA) 60 MG tablet Take 60 mg by mouth daily.   07/25/2018 at 0730  . ramipril (ALTACE) 10 MG capsule Take 20 mg by mouth every morning.   07/25/2018 at 0730  . tiotropium (SPIRIVA) 18 MCG inhalation capsule Place 18 mcg into inhaler and inhale daily.    07/25/2018 at 0730  . vitamin C (ASCORBIC ACID) 500 MG tablet Take 500 mg by mouth daily.   07/25/2018 at 0730  . aspirin EC 81 MG tablet Take 1 tablet (81 mg total) by mouth daily. (Patient not taking: Reported on 06/05/2018) 30 tablet 0 Completed Course at Unknown time   Social History   Socioeconomic History  . Marital status: Widowed    Spouse name: Not on file  . Number of children: Not on file  . Years of education: Not on file  . Highest education level: Not on file  Occupational History  . Not on file  Social Needs  . Financial resource  strain: Not on file  . Food insecurity:    Worry: Not on file    Inability: Not on file  . Transportation needs:    Medical: Not on file    Non-medical: Not on file  Tobacco Use  . Smoking status: Former Smoker    Packs/day: 0.50    Years: 20.00    Pack years: 10.00    Types: Cigarettes    Last attempt to quit: 01/26/1979    Years since quitting: 39.5  . Smokeless tobacco: Never Used  Substance and Sexual Activity  . Alcohol use: No  . Drug use: No  . Sexual activity: Not Currently  Lifestyle  . Physical activity:    Days per week: Not on file    Minutes per session: Not on file  . Stress:  Not on file  Relationships  . Social connections:    Talks on phone: Not on file    Gets together: Not on file    Attends religious service: Not on file    Active member of club or organization: Not on file    Attends meetings of clubs or organizations: Not on file    Relationship status: Not on file  . Intimate partner violence:    Fear of current or ex partner: Not on file    Emotionally abused: Not on file    Physically abused: Not on file    Forced sexual activity: Not on file  Other Topics Concern  . Not on file  Social History Narrative  . Not on file    Family History  Problem Relation Age of Onset  . CVA Mother   . Kidney disease Mother   . Heart disease Father   . Kidney disease Cousin 29  . Kidney disease Cousin 88  . Breast cancer Neg Hx       Review of systems complete and found to be negative unless listed above      PHYSICAL EXAM  General: Well developed, well nourished, in no acute distress, sitting upright  HEENT:  Normocephalic and atramatic Neck:  No JVD.  Lungs: Normal effort of breathing on supplemental oxygen. Decreased breath sounds throughout bilaterally, no wheezing. Heart: HRRR . Normal S1 and S2 without gallops or murmurs.  Abdomen: abdomen nondistended Msk:  Back normal, sitting upright. Tone appears normal for age Extremities: Mild  bilateral lower extremity edema.   Neuro: Alert and oriented X 3. Psych:  Good affect, responds appropriately  Labs:   Lab Results  Component Value Date   WBC 3.8 07/25/2018   HGB 12.2 07/25/2018   HCT 35.7 07/25/2018   MCV 90.3 07/25/2018   PLT 155 07/25/2018    Recent Labs  Lab 07/26/18 0512  NA 134*  K 3.5  CL 94*  CO2 32  BUN 11  CREATININE 0.60  CALCIUM 8.3*  GLUCOSE 85   Lab Results  Component Value Date   TROPONINI <0.03 07/25/2018   No results found for: CHOL No results found for: HDL No results found for: LDLCALC No results found for: TRIG No results found for: CHOLHDL No results found for: LDLDIRECT    Radiology: Dg Chest 2 View  Result Date: 07/25/2018 CLINICAL DATA:  Shortness of breath, cough, and wheezing. EXAM: CHEST - 2 VIEW COMPARISON:  June 07, 2018 FINDINGS: Diffuse interstitial markings. Small to moderate pleural effusions. Cardiomegaly. No other change. IMPRESSION: Findings most consistent with cardiomegaly and mild pulmonary edema/effusions. Electronically Signed   By: Dorise Bullion III M.D   On: 07/25/2018 17:51    EKG: Sinus rhythm  ASSESSMENT AND PLAN:  1.  Acute on chronic hypoxic respiratory failure secondary to acute on chronic systolic congestive heart failure, with echocardiogram 05/2018 revealing LVEF 45% with diffuse hypokinesis. 2.  Severe COPD on supplemental oxygen 3.  Essential hypertension, well controlled this morning on home medication 4.  History of hyponatremia, sodium 134, which is near her baseline  Plan: 1.  Agree with overall therapy 2.  Continue IV Lasix with careful monitoring of electrolytes and renal status 3.  Continue carvedilol, ramipril, and aspirin 4.  Further recommendations pending patient's initial course  Signed: Clabe Seal PA-C 07/26/2018, 10:10 AM

## 2018-07-26 NOTE — Care Management Note (Signed)
Case Management Note  Patient Details  Name: Melanie Huffman MRN: [id number] Date of Birth: [birth year]  Subjective/Objective:                  RNCM met with patient and her daughter Melanie Huffman [phone] regarding transition of care.  Patient agrees with home health services and she is requesting services again through [facility]. ReDS vest and heart failure program explained to patient however per Leroy Sea this is not offered through [facility] for [facility]- patient still wishes to be with [facility].  They has a heart failure program and the order has been placed on patient's chart for MD signature.  Patient states she has a walker however she is not using it as she states she is able to get around independently. Chronic O2 through [place]. Private duty services through [facility] 5.5 hours per day 7 days per week. PCP Dr. Netty Starring. No problems affording/obtaining medications.   Action/Plan:  RNCM to follow. Brad with [facility] updated.  Expected Discharge Date:                  Expected Discharge Plan:     In-House Referral:     Discharge planning Services  CM Consult  Post Acute Care Choice:  Home Health Choice offered to:  Patient, Adult Children  DME Arranged:    DME Agency:     HH Arranged:  PT, RN [place] Agency:  [place]  Status of Service:  In process, will continue to follow  If discussed at Long Length of Stay Meetings, dates discussed:    Additional Comments:  Marshell Garfinkel, RN 07/26/2018, 1:44 PM

## 2018-07-26 NOTE — Progress Notes (Addendum)
[place] [facility] [place] NAME: Reatha Sur    MR#:  [id number]  DATE OF BIRTH:  [birth year]  SUBJECTIVE:  CHIEF COMPLAINT:   Chief Complaint  Patient presents with  . Shortness of Breath  Patient seen today Shortness of breath today better No chest pain No fever Tolerating diet okay Which patient already awoke o fever REVIEW OF SYSTEMS:    ROS  CONSTITUTIONAL: No documented fever. No fatigue, weakness. No weight gain, no weight loss.  EYES: No blurry or double vision.  ENT: No tinnitus. No postnasal drip. No redness of the oropharynx.  RESPIRATORY: No cough, no wheeze, no hemoptysis.  Decreased dyspnea.  CARDIOVASCULAR: No chest pain. No orthopnea. No palpitations. No syncope.  GASTROINTESTINAL: No nausea, no vomiting or diarrhea. No abdominal pain. No melena or hematochezia.  GENITOURINARY: No dysuria or hematuria.  ENDOCRINE: No polyuria or nocturia. No heat or cold intolerance.  HEMATOLOGY: No anemia. No bruising. No bleeding.  INTEGUMENTARY: No rashes. No lesions.  MUSCULOSKELETAL: No arthritis. No swelling. No gout.  NEUROLOGIC: No numbness, tingling, or ataxia. No seizure-type activity.  PSYCHIATRIC: No anxiety. No insomnia. No ADD.   DRUG ALLERGIES:   Allergies  Allergen Reactions  . Amlodipine Swelling     [Onset: 08/26/2013]feet and neck begin to swell  . Adhesive [Tape] Other (See Comments)    Skin is very thin and tears easily. Please use paper tape only  . Codeine Nausea Only  . Percocet [Oxycodone-Acetaminophen] Nausea And Vomiting    Patient took on an empty stomach. Good relief but unsettled. Still has not eaten today  . Sulfa Antibiotics Nausea Only and Rash    rash    VITALS:  Blood pressure 129/60, pulse 72, temperature 98.3 F (36.8 C), temperature source Oral, resp. rate 18, height 5\' 4"  (1.626 m), weight 53.2 kg, SpO2 96 %.  PHYSICAL EXAMINATION:   Physical Exam  GENERAL:  84 y.o.-year-old patient lying  in the bed with no acute distress.  EYES: Pupils equal, round, reactive to light and accommodation. No scleral icterus. Extraocular muscles intact.  HEENT: Head atraumatic, normocephalic. Oropharynx and nasopharynx clear.  NECK:  Supple, no jugular venous distention. No thyroid enlargement, no tenderness.  LUNGS: Improved breath sounds bilaterally, decreased bibasilar crepitations. No use of accessory muscles of respiration.  CARDIOVASCULAR: S1, S2 normal. No murmurs, rubs, or gallops.  ABDOMEN: Soft, nontender, nondistended. Bowel sounds present. No organomegaly or mass.  EXTREMITIES: No cyanosis, clubbing  Decreased pedal edema NEUROLOGIC: Cranial nerves II through XII are intact. No focal Motor or sensory deficits b/l.   PSYCHIATRIC: The patient is alert and oriented x 3.  SKIN: No obvious rash, lesion, or ulcer.   LABORATORY PANEL:   CBC Recent Labs  Lab 07/25/18 1723  WBC 3.8  HGB 12.2  HCT 35.7  PLT 155   ------------------------------------------------------------------------------------------------------------------ Chemistries  Recent Labs  Lab 07/26/18 0512  NA 134*  K 3.5  CL 94*  CO2 32  GLUCOSE 85  BUN 11  CREATININE 0.60  CALCIUM 8.3*  MG 1.9   ------------------------------------------------------------------------------------------------------------------  Cardiac Enzymes Recent Labs  Lab 07/25/18 1723  TROPONINI <0.03   ------------------------------------------------------------------------------------------------------------------  RADIOLOGY:  Dg Chest 2 View  Result Date: 07/25/2018 CLINICAL DATA:  Shortness of breath, cough, and wheezing. EXAM: CHEST - 2 VIEW COMPARISON:  June 07, 2018 FINDINGS: Diffuse interstitial markings. Small to moderate pleural effusions. Cardiomegaly. No other change. IMPRESSION: Findings most consistent with cardiomegaly and mild pulmonary edema/effusions. Electronically Signed  By: Dorise Bullion III M.D   On:  07/25/2018 17:51     ASSESSMENT AND PLAN:  84 year old elderly female patient with history of chronic systolic heart failure, COPD, hypertension currently under hospitalist service for shortness of breath and fluid overload  -Acute on chronic respiratory failure secondary to heart failure exacerbation Status post cardiology evaluation Continue IV Lasix for diuresis Old echocardiogram showed EF of 45% Monitor electrolytes Input output chart Daily body weights Oral ACE inhibitor  -Hyponatremia improved  -COPD stable Continue home dose inhaler treatments  -Hypertension Continue home meds  -Acute hypokalemia Replace potassium orally  All the records are reviewed and case discussed with Care Management/Social Worker. Management plans discussed with the patient, family and they are in agreement.  CODE STATUS: DNR  DVT Prophylaxis: SCDs  TOTAL TIME TAKING CARE OF THIS PATIENT: 35 minutes.   POSSIBLE D/C IN 1 to 2 DAYS, DEPENDING ON CLINICAL CONDITION.  Saundra Shelling M.D on 07/26/2018 at 12:38 PM  Between 7am to 6pm - Pager - [phone]  After 6pm go to [email or web address] - password [email or web address] [facility]  Office  [phone]  CC: Primary care physician; Dion Body, MD  Note: This dictation was prepared with Dragon dictation along with smaller phrase technology. Any transcriptional errors that result from this process are unintentional.

## 2018-07-27 LAB — BASIC METABOLIC PANEL
ANION GAP: 8 (ref 5–15)
BUN: 16 mg/dL (ref 8–23)
CHLORIDE: 93 mmol/L — AB (ref 98–111)
CO2: 34 mmol/L — ABNORMAL HIGH (ref 22–32)
Calcium: 8.7 mg/dL — ABNORMAL LOW (ref 8.9–10.3)
Creatinine, Ser: 0.74 mg/dL (ref 0.44–1.00)
GFR calc Af Amer: >60 mL/min (ref >60)
Glucose, Bld: 93 mg/dL (ref 70–99)
POTASSIUM: 3.8 mmol/L (ref 3.5–5.1)
SODIUM: 135 mmol/L (ref 135–145)

## 2018-07-27 MED ORDER — FUROSEMIDE 20 MG PO TABS: 40.0000 mg | ORAL_TABLET | Freq: Every day | ORAL | 0 refills | Status: DC

## 2018-07-27 NOTE — Progress Notes (Signed)
Patient was discharged home with home health as per order,

## 2018-07-27 NOTE — Plan of Care (Signed)
  Problem: Education: Goal: Individualized Educational Video(s) 07/27/2018 1307 by Basilio Cairo, RN Outcome: Adequate for Discharge 07/27/2018 1305 by Basilio Cairo, RN Outcome: Adequate for Discharge 07/27/2018 1256 by Basilio Cairo, RN Outcome: Adequate for Discharge 07/27/2018 1041 by Basilio Cairo, RN Outcome: Adequate for Discharge   Problem: Education: Goal: Individualized Educational Video(s) 07/27/2018 1307 by Basilio Cairo, RN Outcome: Adequate for Discharge 07/27/2018 1305 by Basilio Cairo, RN Outcome: Adequate for Discharge 07/27/2018 1256 by Basilio Cairo, RN Outcome: Adequate for Discharge 07/27/2018 1041 by Basilio Cairo, RN Outcome: Adequate for Discharge

## 2018-07-27 NOTE — Discharge Summary (Signed)
[facility] NAME: Melanie Huffman    MR#:  [id number]  DATE OF BIRTH:  08/23/1928  DATE OF ADMISSION:  07/25/2018 ADMITTING PHYSICIAN: Demetrios Loll, MD  DATE OF DISCHARGE: 07/27/2018  1:15 PM  PRIMARY CARE PHYSICIAN: Dion Body, MD   ADMISSION DIAGNOSIS:  SOB (shortness of breath) [R06.02] HCAP (healthcare-associated pneumonia) [J18.9] COPD Hypertension Congestive heart failure exacerbation's History of breast cancer Chronic respiratory failure DISCHARGE DIAGNOSIS:  Active Problems:   Acute on chronic systolic CHF (congestive heart failure) (HCC) Chronic respiratory failure Emphysema Hypertension Breast cancer  SECONDARY DIAGNOSIS:   Past Medical History:  Diagnosis Date  . Arthritis 2019   mostly in neck  . Asthma   . Breast cancer ([place]) 1991   mastectomy left  . Cancer ([place]) 1991   L Breast  . CHF (congestive heart failure) ([place])   . COPD (chronic obstructive pulmonary disease) ([place])    hypoxia with resp failure admission in 03/2018  . Dementia    confusion beginning  . Emphysema of lung ([place])   . GERD (gastroesophageal reflux disease)   . Heart murmur   . History of hiatal hernia   . Hypertension 03/2018   recent hypertension urgency ..admitted in april 2019  . Hypoxia 03/2018   O2 saturation runs 86-91% on Room Air.  . Intermittent dysphagia   . Peripheral vascular disease ([place])   . Shortness of breath dyspnea   . Traumatic subdural hematoma [facility])      ADMITTING HISTORY Melanie Huffman  is a 84 y.o. female with a known history of multiple medical problems including chronic respiratory failure on home O2 [place] 2.5 L, CHF, COPD, HTN, breast cancer. She presents to the emergency department for shortness of breath today. She used home nebulizer without improvement.  She also complains of bilateral leg swelling.  She was found hypoxia at 80s% on exertion in the ED.  Chest x-ray show pulmonary edema.  HOSPITAL  COURSE:  Patient was admitted to telemetry.She continued oxygen via nasal cannula and was diuresed with Lasix 40 mg intravenously twice daily.  She was recently worked up with echocardiogram which showed EF of 45%.  Cardiology consultation was done with [facility] clinic cardiology.  Patient diuresed well her swelling in the lower extremities improved.  Her shortness of breath improved.  Patient's hypoxia improved.  She will be discharged home on oral Lasix, beta-blocker and will continue oxygen via nasal cannula.  Patient will be discharged home with home health services.  CONSULTS OBTAINED:  Treatment Team:  Isaias Cowman, MD  DRUG ALLERGIES:   Allergies  Allergen Reactions  . Amlodipine Swelling     [Onset: 08/26/2013]feet and neck begin to swell  . Adhesive [Tape] Other (See Comments)    Skin is very thin and tears easily. Please use paper tape only  . Codeine Nausea Only  . Percocet [Oxycodone-Acetaminophen] Nausea And Vomiting    Patient took on an empty stomach. Good relief but unsettled. Still has not eaten today  . Sulfa Antibiotics Nausea Only and Rash    rash    DISCHARGE MEDICATIONS:   Allergies as of 07/27/2018      Reactions   Amlodipine Swelling    [Onset: 08/26/2013]feet and neck begin to swell   Adhesive [tape] Other (See Comments)   Skin is very thin and tears easily. Please use paper tape only   Codeine Nausea Only   Percocet [oxycodone-acetaminophen] Nausea And Vomiting   Patient took on an  empty stomach. Good relief but unsettled. Still has not eaten today   Sulfa Antibiotics Nausea Only, Rash   rash      Medication List    STOP taking these medications   aspirin EC 81 MG tablet     TAKE these medications   acetaminophen 325 MG tablet Commonly known as:  TYLENOL Take 2 tablets (650 mg total) by mouth every 6 (six) hours as needed for mild pain (or Fever >/= 101).   albuterol 108 (90 Base) MCG/ACT inhaler Commonly known as:  PROVENTIL  HFA;VENTOLIN HFA Inhale 2 puffs into the lungs every 6 (six) hours as needed for wheezing or shortness of breath.   CALCIUM 600/VITAMIN D 600-400 MG-UNIT Tabs Generic drug:  Calcium Carbonate-Vitamin D3 Take 1 tablet by mouth daily.   carvedilol 25 MG tablet Commonly known as:  COREG Take 25 mg by mouth 2 (two) times daily with a meal.   CENTRUM SILVER PO Take 1 tablet by mouth daily.   cloNIDine 0.1 MG tablet Commonly known as:  CATAPRES Take 0.1 mg by mouth 2 (two) times daily.   fluticasone furoate-vilanterol 100-25 MCG/INH Aepb Commonly known as:  BREO ELLIPTA Inhale 1 puff into the lungs daily.   furosemide 20 MG tablet Commonly known as:  LASIX Take 2 tablets (40 mg total) by mouth daily. What changed:  how much to take   hydrALAZINE 25 MG tablet Commonly known as:  APRESOLINE Take 1 tablet (25 mg total) by mouth every 8 (eight) hours.   METAMUCIL FIBER PO Take 5 capsules by mouth 3 (three) times daily as needed.   omeprazole 20 MG capsule Commonly known as:  PRILOSEC Take 20 mg by mouth daily.   potassium chloride SA 20 MEQ tablet Commonly known as:  K-DUR,KLOR-CON Take 1 tablet (20 mEq total) by mouth daily.   raloxifene 60 MG tablet Commonly known as:  EVISTA Take 60 mg by mouth daily.   ramipril 10 MG capsule Commonly known as:  ALTACE Take 20 mg by mouth every morning.   tiotropium 18 MCG inhalation capsule Commonly known as:  SPIRIVA Place 18 mcg into inhaler and inhale daily.   vitamin C 500 MG tablet Commonly known as:  ASCORBIC ACID Take 500 mg by mouth daily.       Today  Patient seen and evaluated today Decreased shortness of breath Comfortable on oxygen via nasal cannula No chest pain Decreased swelling in lower extremities VITAL SIGNS:  Blood pressure (!) 113/54, pulse 65, temperature 98.4 F (36.9 C), temperature source Oral, resp. rate 18, height 5\' 4"  (1.626 m), weight 51.5 kg, SpO2 94 %.  I/O:    Intake/Output Summary  (Last 24 hours) at 07/27/2018 1557 Last data filed at 07/27/2018 1137 Gross per 24 hour  Intake 720 ml  Output 2900 ml  Net -2180 ml    PHYSICAL EXAMINATION:  Physical Exam  GENERAL:  84 y.o.-year-old patient lying in the bed with no acute distress.  LUNGS: Normal breath sounds bilaterally, no wheezing, rales,rhonchi or crepitation. No use of accessory muscles of respiration.  CARDIOVASCULAR: S1, S2 normal. No murmurs, rubs, or gallops.  ABDOMEN: Soft, non-tender, non-distended. Bowel sounds present. No organomegaly or mass.  NEUROLOGIC: Moves all 4 extremities. PSYCHIATRIC: The patient is alert and oriented x 3.  SKIN: No obvious rash, lesion, or ulcer.   DATA REVIEW:   CBC Recent Labs  Lab 07/25/18 1723  WBC 3.8  HGB 12.2  HCT 35.7  PLT 155    Chemistries  Recent Labs  Lab 07/26/18 0512 07/27/18 0510  NA 134* 135  K 3.5 3.8  CL 94* 93*  CO2 32 34*  GLUCOSE 85 93  BUN 11 16  CREATININE 0.60 0.74  CALCIUM 8.3* 8.7*  MG 1.9  --     Cardiac Enzymes Recent Labs  Lab 07/25/18 1723  TROPONINI <0.03    Microbiology Results  Results for orders placed or performed during the hospital encounter of 06/04/18  MRSA PCR Screening     Status: None   Collection Time: 06/04/18  9:48 PM  Result Value Ref Range Status   MRSA by PCR NEGATIVE NEGATIVE Final    Comment:        The GeneXpert MRSA Assay (FDA approved for NASAL specimens only), is one component of a comprehensive MRSA colonization surveillance program. It is not intended to diagnose MRSA infection nor to guide or monitor treatment for MRSA infections. Performed at [facility], [address] [place], [place] [address]     RADIOLOGY:  Dg Chest 2 View  Result Date: 07/25/2018 CLINICAL DATA:  Shortness of breath, cough, and wheezing. EXAM: CHEST - 2 VIEW COMPARISON:  June 07, 2018 FINDINGS: Diffuse interstitial markings. Small to moderate pleural effusions. Cardiomegaly. No other change.  IMPRESSION: Findings most consistent with cardiomegaly and mild pulmonary edema/effusions. Electronically Signed   By: Dorise Bullion III M.D   On: 07/25/2018 17:51    Follow up with PCP in 1 week.  Management plans discussed with the patient, family and they are in agreement.  CODE STATUS: DNR    Code Status Orders  (From admission, onward)         Start     Ordered   07/25/18 2005  Do not attempt resuscitation (DNR)  Continuous    Question Answer Comment  In the event of cardiac or respiratory ARREST Do not call a "code blue"   In the event of cardiac or respiratory ARREST Do not perform Intubation, CPR, defibrillation or ACLS   In the event of cardiac or respiratory ARREST Use medication by any route, position, wound care, and other measures to relive pain and suffering. May use oxygen, suction and manual treatment of airway obstruction as needed for comfort.      07/25/18 2004        Code Status History    Date Active Date Inactive Code Status Order ID Comments User Context   06/04/2018 1959 06/09/2018 1920 Full Code [id number]  Max Sane, MD Inpatient   06/04/2018 1832 06/04/2018 1959 DNR [id number]  Max Sane, MD ED   03/08/2018 0008 03/09/2018 1733 Full Code [id number]  Lance Coon, MD Inpatient   10/31/2016 0237 11/04/2016 2008 Full Code [id number]  Lance Coon, MD Inpatient   07/09/2016 1558 07/10/2016 1914 Full Code [id number]  Henreitta Leber, MD Inpatient   04/12/2015 1949 04/16/2015 1818 Full Code [id number]  Hessie Knows, MD Inpatient   04/11/2015 1252 04/12/2015 1949 Full Code [id number]  Hessie Knows, MD Inpatient   07/26/2014 0445 07/26/2014 1934 Full Code [id number]  Ashok Pall, MD Inpatient    Advance Directive Documentation     Most Recent Value  Type of Advance Directive  Healthcare Power of Attorney, Living will  Pre-existing out of facility DNR order (yellow form or pink MOST form)  -  "MOST" Form in Place?  -      TOTAL TIME TAKING CARE OF THIS PATIENT ON DAY OF  DISCHARGE: more than 34 minutes.   Logon Uttech M.D  on 07/27/2018 at 3:57 PM  Between 7am to 6pm - Pager - [phone]  After 6pm go to [email or web address] - password [email or web address] [facility]  Office  [phone]  CC: Primary care physician; Dion Body, MD  Note: This dictation was prepared with Dragon dictation along with smaller phrase technology. Any transcriptional errors that result from this process are unintentional.

## 2018-07-27 NOTE — Progress Notes (Signed)
[facility] Cardiology  SUBJECTIVE: The patient reports that her breathing feels back to baseline. She denies chest pain. She reports increased urinary output.   Vitals:   07/26/18 1931 07/27/18 0425 07/27/18 0753 07/27/18 0842  BP: (!) 150/74 (!) 145/66 (!) 109/58 (!) 113/54  Pulse: 74 67 62 65  Resp: 18 18    Temp: 98.2 F (36.8 C) 98 F (36.7 C) 98.4 F (36.9 C)   TempSrc: Oral Oral Oral   SpO2: 90% 90% 94%   Weight:  51.5 kg    Height:         Intake/Output Summary (Last 24 hours) at 07/27/2018 0849 Last data filed at 07/27/2018 0825 Gross per 24 hour  Intake 1800 ml  Output 3300 ml  Net -1500 ml      PHYSICAL EXAM  General: Well developed, well nourished, in no acute distress, elderly female, reclining in bed. HEENT:  Normocephalic and atramatic Neck:  No JVD.  Lungs: Normal effort of breathing on supplemental oxygen. Decreased breath sounds throughout. No wheezing. Heart: HRRR . Normal S1 and S2 without gallops or murmurs.  Abdomen: Bowel sounds are present Msk:  Kyphosis. Gait not assessed.  Extremities: No clubbing, cyanosis or edema.   Neuro: Alert and oriented X 3. Psych:  Good affect, responds appropriately   LABS: Basic Metabolic Panel: Recent Labs    07/26/18 0512 07/27/18 0510  NA 134* 135  K 3.5 3.8  CL 94* 93*  CO2 32 34*  GLUCOSE 85 93  BUN 11 16  CREATININE 0.60 0.74  CALCIUM 8.3* 8.7*  MG 1.9  --    Liver Function Tests: No results for input(s): AST, ALT, ALKPHOS, BILITOT, PROT, ALBUMIN in the last 72 hours. No results for input(s): LIPASE, AMYLASE in the last 72 hours. CBC: Recent Labs    07/25/18 1723  WBC 3.8  HGB 12.2  HCT 35.7  MCV 90.3  PLT 155   Cardiac Enzymes: Recent Labs    07/25/18 1723  TROPONINI <0.03   BNP: Invalid input(s): POCBNP D-Dimer: No results for input(s): DDIMER in the last 72 hours. Hemoglobin A1C: No results for input(s): HGBA1C in the last 72 hours. Fasting Lipid Panel: No results for input(s):  CHOL, HDL, LDLCALC, TRIG, CHOLHDL, LDLDIRECT in the last 72 hours. Thyroid Function Tests: No results for input(s): TSH, T4TOTAL, T3FREE, THYROIDAB in the last 72 hours.  Invalid input(s): FREET3 Anemia Panel: No results for input(s): VITAMINB12, FOLATE, FERRITIN, TIBC, IRON, RETICCTPCT in the last 72 hours.  Dg Chest 2 View  Result Date: 07/25/2018 CLINICAL DATA:  Shortness of breath, cough, and wheezing. EXAM: CHEST - 2 VIEW COMPARISON:  June 07, 2018 FINDINGS: Diffuse interstitial markings. Small to moderate pleural effusions. Cardiomegaly. No other change. IMPRESSION: Findings most consistent with cardiomegaly and mild pulmonary edema/effusions. Electronically Signed   By: Dorise Bullion III M.D   On: 07/25/2018 17:51     Echo EF 45%  TELEMETRY: sinus rhythm 77 bpm  ASSESSMENT AND PLAN:  Active Problems:   Acute on chronic systolic CHF (congestive heart failure) ([place])    1. Acute on chronic hypoxic respiratory failure secondary to acute on chronic systolic congestive heart failure, with echocardiogram in 05/2018 revealing LVEF 45% with diffuse hypokinesis. The patient is improving on IV Lasix and continued home HF meds. 2. Severe COPD on supplemental oxygen 3. Essential hypertension, diastolic blood pressure low this morning on carvedilol, clonidine, hydralazine, ramipril, and furosemide. 4. Hyponatremia, 135 this morning  Plan: 1. Agree with overall plan  2. Continue carvedilol and ramipril 3.  Recommend holding clonidine while BP low 4. No further cardiac diagnostics recommended at this time.   Clabe Seal, PA-C 07/27/2018 8:49 AM

## 2018-07-27 NOTE — Plan of Care (Signed)
  Problem: Education: Goal: Knowledge of General Education information will improve Description: Including pain rating scale, medication(s)/side effects and non-pharmacologic comfort measures Outcome: Adequate for Discharge   Problem: Clinical Measurements: Goal: Respiratory complications will improve Outcome: Adequate for Discharge   

## 2018-07-28 ENCOUNTER — Other Ambulatory Visit: Payer: Self-pay

## 2018-07-28 NOTE — Patient Outreach (Signed)
[name]) Care Management PCP will complete TOC 07/28/2018  Melanie Huffman 04-28-1928 [id number]   Care Coordination/Telephone Assessment Successful telephone encounter to the above patient post hospital discharge 07/27/18 for admission 8/25- 8/27 for HCAP. [facility] RN CM introduced role of [facility] Community Case Management. Verbal consent obtained. Initial home visit scheduled for Tuesday September 3 at [facility].  Patient states she is doing well. She lives independently at "[place]" however she has a paid caregiver that assist with baths, medication administrations, cleaning, meal preparations, and per patient monitors her BP and "swelling". Caregiver available daily 9-1:30pm. Patient deines needs for community services. Has one child. Daughter lives in [place], [place]. Visits often. Takes patient to all appointments and if she is not available caregiver will transport.  Patient states she has all her medication prescribed at discharge. Discharge AVS in home.Patient has a PCP post hospitalization follow-up September 4th at [facility]: Follow up home visit as scheduled above.  Althea Backs E. Rollene Rotunda RN, BSN [facility] Care Management Coordinator [phone]

## 2018-07-29 DIAGNOSIS — Z9181 History of falling: Secondary | ICD-10-CM | POA: Diagnosis not present

## 2018-07-29 DIAGNOSIS — K219 Gastro-esophageal reflux disease without esophagitis: Secondary | ICD-10-CM | POA: Diagnosis not present

## 2018-07-29 DIAGNOSIS — Z7951 Long term (current) use of inhaled steroids: Secondary | ICD-10-CM | POA: Diagnosis not present

## 2018-07-29 DIAGNOSIS — R1310 Dysphagia, unspecified: Secondary | ICD-10-CM | POA: Diagnosis not present

## 2018-07-29 DIAGNOSIS — I110 Hypertensive heart disease with heart failure: Secondary | ICD-10-CM | POA: Diagnosis not present

## 2018-07-29 DIAGNOSIS — Z853 Personal history of malignant neoplasm of breast: Secondary | ICD-10-CM | POA: Diagnosis not present

## 2018-07-29 DIAGNOSIS — J9611 Chronic respiratory failure with hypoxia: Secondary | ICD-10-CM | POA: Diagnosis not present

## 2018-07-29 DIAGNOSIS — I739 Peripheral vascular disease, unspecified: Secondary | ICD-10-CM | POA: Diagnosis not present

## 2018-07-29 DIAGNOSIS — I5022 Chronic systolic (congestive) heart failure: Secondary | ICD-10-CM | POA: Diagnosis not present

## 2018-07-29 DIAGNOSIS — F0390 Unspecified dementia without behavioral disturbance: Secondary | ICD-10-CM | POA: Diagnosis not present

## 2018-07-29 DIAGNOSIS — Z87891 Personal history of nicotine dependence: Secondary | ICD-10-CM | POA: Diagnosis not present

## 2018-07-29 DIAGNOSIS — M1990 Unspecified osteoarthritis, unspecified site: Secondary | ICD-10-CM | POA: Diagnosis not present

## 2018-07-29 DIAGNOSIS — J449 Chronic obstructive pulmonary disease, unspecified: Secondary | ICD-10-CM | POA: Diagnosis not present

## 2018-08-03 ENCOUNTER — Other Ambulatory Visit: Payer: Self-pay

## 2018-08-03 NOTE — Patient Outreach (Signed)
[facility]) Care Management  TOC will be completed by PCP and office will refer to [facility] care Management if Needed 08/03/2018  ANANI GU July 13, 1928 [id number]  CARINNE BRANDENBURGER is an 84 y.o. female  Subjective: Patient states she is doing better than she was when she initially came home from the hospital. States she has a "caregiver" Rosemarie Ax that is private pay that helps her 7 days a week 9am-1:30pm. States caregiver helps her with bathing, "although I could probably do it by myself now", cleaning, and "anything else I need". "She even drives me to my doctors appointments". Patient states she is now able to walk to the dinning hall for her meals. Caregiver goes with her currently to make sure she is stable. She enjoys being outdoors and now that she has her portable oxygen concentrator she is able to be more active. States her strength is slowly coming back. She does not drive. She relies on friends, family, or caregiver for transportation.   Objective:   BP 140/60 (BP Location: Right Arm, Patient Position: Sitting, Cuff Size: Normal)   Pulse 66   Resp 18   Ht 1.638 m (5' 4.5")   Wt 116 lb (52.6 kg) Comment: reported morning weight  SpO2 96%   BMI 19.60 kg/m   Physical Exam  Constitutional: She is oriented to person, place, and time. She appears well-developed and well-nourished.  Cardiovascular: Normal rate and intact distal pulses.  Murmur heard. Regular irregular rythem  Respiratory: Effort normal and breath sounds normal.  GI: Soft. Bowel sounds are normal.  Musculoskeletal: Normal range of motion.  Non-pitting edema noted to feet bilat  Neurological: She is alert and oriented to person, place, and time.  Skin: Skin is warm and dry.  Psychiatric: She has a normal mood and affect. Her behavior is normal. Judgment and thought content normal.    Encounter Medications:   Outpatient Encounter Medications as of 08/03/2018  Medication Sig  . albuterol (PROVENTIL  HFA;VENTOLIN HFA) 108 (90 Base) MCG/ACT inhaler Inhale 2 puffs into the lungs every 6 (six) hours as needed for wheezing or shortness of breath.  . Calcium Carbonate-Vitamin D3 (CALCIUM 600/VITAMIN D) 600-400 MG-UNIT TABS Take 1 tablet by mouth daily.  . carvedilol (COREG) 25 MG tablet Take 25 mg by mouth 2 (two) times daily with a meal.  . cloNIDine (CATAPRES) 0.1 MG tablet Take 0.1 mg by mouth 2 (two) times daily.  . fluticasone furoate-vilanterol (BREO ELLIPTA) 100-25 MCG/INH AEPB Inhale 1 puff into the lungs daily.  . furosemide (LASIX) 20 MG tablet Take 2 tablets (40 mg total) by mouth daily.  . hydrALAZINE (APRESOLINE) 25 MG tablet Take 1 tablet (25 mg total) by mouth every 8 (eight) hours.  . Multiple Vitamins-Minerals (CENTRUM SILVER PO) Take 1 tablet by mouth daily.  Marland Kitchen omeprazole (PRILOSEC) 20 MG capsule Take 20 mg by mouth daily.   . potassium chloride SA (K-DUR,KLOR-CON) 20 MEQ tablet Take 1 tablet (20 mEq total) by mouth daily.  . Psyllium (METAMUCIL FIBER PO) Take 5 capsules by mouth 3 (three) times daily as needed.   . raloxifene (EVISTA) 60 MG tablet Take 60 mg by mouth daily.  . ramipril (ALTACE) 10 MG capsule Take 20 mg by mouth every morning.  . tiotropium (SPIRIVA) 18 MCG inhalation capsule Place 18 mcg into inhaler and inhale daily.   . vitamin C (ASCORBIC ACID) 500 MG tablet Take 500 mg by mouth daily.  Marland Kitchen acetaminophen (TYLENOL) 325 MG tablet Take 2  tablets (650 mg total) by mouth every 6 (six) hours as needed for mild pain (or Fever >/= 101). (Patient not taking: Reported on 08/03/2018)   No facility-administered encounter medications on file as of 08/03/2018.     Functional Status:   In your present state of health, do you have any difficulty performing the following activities: 08/03/2018 07/25/2018  Hearing? N N  Vision? N N  Difficulty concentrating or making decisions? N N  Walking or climbing stairs? Y Y  Dressing or bathing? N N  Doing errands, shopping? N N   Preparing Food and eating ? N -  Using the Toilet? N -  In the past six months, have you accidently leaked urine? Y -  Do you have problems with loss of bowel control? N -  Managing your Medications? Y -  Managing your Finances? N -  Housekeeping or managing your Housekeeping? Y -  Some recent data might be hidden    Fall/Depression Screening:    Fall Risk  08/03/2018  Falls in the past year? Yes  Number falls in past yr: 2 or more  Injury with Fall? Yes  Risk Factor Category  High Fall Risk  Risk for fall due to : History of fall(s);Impaired balance/gait  Follow up Falls prevention discussed;Education provided;Falls evaluation completed   PHQ 2/9 Scores 08/03/2018  PHQ - 2 Score 0    Assessment: Patient sleeping in recliner with oxygen on upon RN CM and nursing student arrival. [place] door open. Patient motioned for RN and student to come in. Patient alert and oriented. Receptive to questions asked and education regarding HF. RN CM and student gave patient [facility] calendar and HF packet. Reviewed HF zones. Patient aware that she should not gain 3 lbs overnight prior to RN CM education. Patient currently weighs self daily and records.  Patient has not had formal HF zone education and will see Darylene Price, NP on Friday 9/6 for a consultation visit. Patient able to move about in home. Observed ambulating per RN CM request. Moderate pace. Slightly unsteady. Patient encouraged to slow down. Did not use assistive device. Fall precautions discussed. Daughter fills pill boxes. Two cups sitting on patients "TV tray". Patient states that caregiver puts medications out of pill box in cups and labels cups with times to take. Patients bedtime pills in cup in bathroom.  Plan: Follow up phone call in 2 weeks to assess progress towards short term goals  [facility] CM Care Plan Problem One     Most Recent Value  Care Plan Problem One  High Risk for  hospital readmission related to/ as evidenced by recent  hospitalization for HCAP and multiple admission within 6 months  Role Documenting the Problem One  Care Management [place] for Problem One  Active  [facility] Long Term Goal   Patient will not experience hospital readmission over the next 31 days   [facility] Long Term Goal Start Date  08/03/18  Interventions for Problem One Long Term Goal  discussed with patient recent hospital visit, importance of PCP visit tomorrow, reviewed with patients daughter need for additional services in home  [facility] CM Short Term Goal #1   patient will take all medications as prescribed over the next 30 days  [facility] CM Short Term Goal #1 Start Date  08/03/18  Interventions for Short Term Goal #1  medication reconciliation completed with daughter via phone, verified medications were in patients home, discussed importance with patients daughter and patient need for medication  clarifications at tomorrows appointment  [facility] CM Short Term Goal #2   patient will attend all MD appointments over the next 30 days  [facility] CM Short Term Goal #2 Start Date  08/03/18  Interventions for Short Term Goal #2  reviewed follow-up appointments with patient and daughter and discussed importance of daughter attending new HF appointment with patient on Friday     Ronald Vinsant E. Rollene Rotunda RN, BSN [facility] Care Management Coordinator [phone]

## 2018-08-04 DIAGNOSIS — F411 Generalized anxiety disorder: Secondary | ICD-10-CM | POA: Diagnosis not present

## 2018-08-04 DIAGNOSIS — I5022 Chronic systolic (congestive) heart failure: Secondary | ICD-10-CM | POA: Diagnosis not present

## 2018-08-04 NOTE — Patient Outreach (Signed)
[facility]) Care Management  08/04/2018  Melanie Huffman 02-25-1928 [id number]  Note created in error  Melisse Caetano E. Rollene Rotunda RN, BSN [facility] Care Management Coordinator [phone]

## 2018-08-04 NOTE — Telephone Encounter (Signed)
This encounter was created in error - please disregard.

## 2018-08-05 DIAGNOSIS — J449 Chronic obstructive pulmonary disease, unspecified: Secondary | ICD-10-CM | POA: Diagnosis not present

## 2018-08-05 DIAGNOSIS — J9601 Acute respiratory failure with hypoxia: Secondary | ICD-10-CM | POA: Diagnosis not present

## 2018-08-06 ENCOUNTER — Encounter: Payer: Self-pay | Admitting: Family

## 2018-08-06 ENCOUNTER — Ambulatory Visit: Payer: PPO | Attending: Family | Admitting: Family

## 2018-08-06 VITALS — BP 169/82 | HR 69 | Resp 18 | Ht 64.0 in | Wt 117.5 lb

## 2018-08-06 DIAGNOSIS — F0390 Unspecified dementia without behavioral disturbance: Secondary | ICD-10-CM | POA: Diagnosis not present

## 2018-08-06 DIAGNOSIS — Z882 Allergy status to sulfonamides status: Secondary | ICD-10-CM | POA: Insufficient documentation

## 2018-08-06 DIAGNOSIS — Z888 Allergy status to other drugs, medicaments and biological substances status: Secondary | ICD-10-CM | POA: Diagnosis not present

## 2018-08-06 DIAGNOSIS — Z885 Allergy status to narcotic agent status: Secondary | ICD-10-CM | POA: Insufficient documentation

## 2018-08-06 DIAGNOSIS — Z853 Personal history of malignant neoplasm of breast: Secondary | ICD-10-CM | POA: Diagnosis not present

## 2018-08-06 DIAGNOSIS — I110 Hypertensive heart disease with heart failure: Secondary | ICD-10-CM | POA: Diagnosis not present

## 2018-08-06 DIAGNOSIS — I5022 Chronic systolic (congestive) heart failure: Secondary | ICD-10-CM | POA: Diagnosis not present

## 2018-08-06 DIAGNOSIS — Z79899 Other long term (current) drug therapy: Secondary | ICD-10-CM | POA: Diagnosis not present

## 2018-08-06 DIAGNOSIS — Z87891 Personal history of nicotine dependence: Secondary | ICD-10-CM | POA: Insufficient documentation

## 2018-08-06 DIAGNOSIS — K219 Gastro-esophageal reflux disease without esophagitis: Secondary | ICD-10-CM | POA: Diagnosis not present

## 2018-08-06 DIAGNOSIS — I739 Peripheral vascular disease, unspecified: Secondary | ICD-10-CM | POA: Diagnosis not present

## 2018-08-06 DIAGNOSIS — F419 Anxiety disorder, unspecified: Secondary | ICD-10-CM | POA: Insufficient documentation

## 2018-08-06 DIAGNOSIS — I10 Essential (primary) hypertension: Secondary | ICD-10-CM

## 2018-08-06 DIAGNOSIS — J449 Chronic obstructive pulmonary disease, unspecified: Secondary | ICD-10-CM | POA: Diagnosis not present

## 2018-08-06 DIAGNOSIS — Z9981 Dependence on supplemental oxygen: Secondary | ICD-10-CM | POA: Diagnosis not present

## 2018-08-06 NOTE — Progress Notes (Signed)
Patient ID: Melanie Huffman, female    DOB: 05/15/1928, 84 y.o.   MRN: [id number]  HPI  Ms Figg is a 84 y/o female with a history of asthma, HTN, PVD, COPD, anxiety, previous tobacco use and chronic heart failure.   Echo report from 06/05/18 reviewed and showed an EF of 45% along with mild AR/ MR and elevated PA pressure of 45 mm Hg.   Admitted 07/25/18 due to pulmonary edema. Cardiology consult obtained. Initially needed IV lasix and transitioned to oral diuretics. Discharged after 2 days. Admitted 06/04/18 due to HF and syncope. Cardiology and nephrology consults obtained. Gave IV fluids and then IV lasix. Discharged after 5 days.   She presents today for her initial visit with a chief complaint of moderate fatigue upon minimal exertion. She describes this as chronic in nature having been present for several years. She has associated shortness of breath, rhinorrhea and anxiety along with this. She denies any difficulty sleeping, abdominal distention, palpitations, pedal edema, chest pain, dizziness or weight gain. Currently has home health nurse and PT coming ~ twice a week and an aide that is there 9-1 seven days/ week. She says that she gets very anxious when going to appointments.   Past Medical History:  Diagnosis Date  . Anxiety   . Arthritis 2019   mostly in neck  . Asthma   . Breast cancer ([place]) 1991   mastectomy left  . Cancer ([place]) 1991   L Breast  . CHF (congestive heart failure) ([place])   . COPD (chronic obstructive pulmonary disease) ([place])    hypoxia with resp failure admission in 03/2018  . Dementia    confusion beginning  . Emphysema of lung ([place])   . GERD (gastroesophageal reflux disease)   . Heart murmur   . History of hiatal hernia   . Hypertension 03/2018   recent hypertension urgency ..admitted in april 2019  . Hypoxia 03/2018   O2 saturation runs 86-91% on Room Air.  . Intermittent dysphagia   . Peripheral vascular disease ([place])   . Shortness of breath dyspnea    . Traumatic subdural hematoma [facility])    Past Surgical History:  Procedure Laterality Date  . ABDOMINAL HYSTERECTOMY     Partial  . APPENDECTOMY    . BREAST BIOPSY Right    1980 and 1970's  . BREAST SURGERY  1991   L Mastectomy  . EYE SURGERY Bilateral    cataract extraction  . MASTECTOMY    . ORIF PATELLA Left 04/12/2015   Procedure: OPEN REDUCTION INTERNAL (ORIF) FIXATION PATELLA;  Surgeon: Hessie Knows, MD;  Location: [facility] ORS;  Service: Orthopedics;  Laterality: Left;  . ORIF WRIST FRACTURE Right 05/04/2018   Procedure: OPEN REDUCTION INTERNAL FIXATION (ORIF) WRIST FRACTURE;  Surgeon: Hessie Knows, MD;  Location: [facility] ORS;  Service: Orthopedics;  Laterality: Right;  . PERIPHERAL VASCULAR CATHETERIZATION Left 04/17/2015   Procedure: Lower Extremity Angiography;  Surgeon: Katha Cabal, MD;  Location: [place] CV LAB;  Service: Cardiovascular;  Laterality: Left;  . PERIPHERAL VASCULAR CATHETERIZATION  04/17/2015   Procedure: Lower Extremity Intervention;  Surgeon: Katha Cabal, MD;  Location: [place] CV [facility];  Service: Cardiovascular;;  . TONSILLECTOMY     Family History  Problem Relation Age of Onset  . CVA Mother   . Kidney disease Mother   . Heart disease Father   . Kidney disease Cousin 49  . Kidney disease Cousin 34  . Breast cancer Neg Hx  Social History   Tobacco Use  . Smoking status: Former Smoker    Packs/day: 0.50    Years: 20.00    Pack years: 10.00    Types: Cigarettes    Last attempt to quit: 01/26/1979    Years since quitting: 39.5  . Smokeless tobacco: Never Used  Substance Use Topics  . Alcohol use: No   Allergies  Allergen Reactions  . Amlodipine Swelling     [Onset: 08/26/2013]feet and neck begin to swell  . Adhesive [Tape] Other (See Comments)    Skin is very thin and tears easily. Please use paper tape only  . Codeine Nausea Only  . Percocet [Oxycodone-Acetaminophen] Nausea And Vomiting    Patient took on an empty stomach.  Good relief but unsettled. Still has not eaten today  . Sulfa Antibiotics Nausea Only and Rash    rash   Prior to Admission medications   Medication Sig Start Date End Date Taking? Authorizing Provider  acetaminophen (TYLENOL) 325 MG tablet Take 2 tablets (650 mg total) by mouth every 6 (six) hours as needed for mild pain (or Fever >/= 101). 06/09/18  Yes Wieting, Richard, MD  albuterol (PROVENTIL HFA;VENTOLIN HFA) 108 (90 Base) MCG/ACT inhaler Inhale 2 puffs into the lungs every 6 (six) hours as needed for wheezing or shortness of breath.   Yes [provider]  Calcium Carbonate-Vitamin D3 (CALCIUM 600/VITAMIN D) 600-400 MG-UNIT TABS Take 1 tablet by mouth daily.   Yes [provider]  carvedilol (COREG) 25 MG tablet Take 25 mg by mouth 2 (two) times daily with a meal.   Yes [provider]  cloNIDine (CATAPRES) 0.1 MG tablet Take 0.1 mg by mouth 2 (two) times daily. 07/20/18  Yes [provider]  escitalopram (LEXAPRO) 5 MG tablet Take 5 mg by mouth daily.   Yes [provider]  fluticasone furoate-vilanterol (BREO ELLIPTA) 100-25 MCG/INH AEPB Inhale 1 puff into the lungs daily.   Yes [provider]  furosemide (LASIX) 20 MG tablet Take 2 tablets (40 mg total) by mouth daily. 07/27/18 08/26/18 Yes Pyreddy, Reatha Harps, MD  hydrALAZINE (APRESOLINE) 25 MG tablet Take 1 tablet (25 mg total) by mouth every 8 (eight) hours. 06/09/18  Yes Wieting, Richard, MD  Multiple Vitamins-Minerals (CENTRUM SILVER PO) Take 1 tablet by mouth daily.   Yes [provider]  omeprazole (PRILOSEC) 20 MG capsule Take 20 mg by mouth daily.    Yes [provider]  potassium chloride SA (K-DUR,KLOR-CON) 20 MEQ tablet Take 1 tablet (20 mEq total) by mouth daily. 03/09/18  Yes Pyreddy, Reatha Harps, MD  Psyllium (METAMUCIL FIBER PO) Take 5 capsules by mouth 3 (three) times daily as needed.    Yes [provider]  raloxifene (EVISTA) 60 MG tablet Take 60 mg by  mouth daily.   Yes [provider]  ramipril (ALTACE) 10 MG capsule Take 20 mg by mouth every morning.   Yes [provider]  tiotropium (SPIRIVA) 18 MCG inhalation capsule Place 18 mcg into inhaler and inhale daily.    Yes [provider]  vitamin C (ASCORBIC ACID) 500 MG tablet Take 500 mg by mouth daily.   Yes [provider]    Review of Systems  Constitutional: Positive for fatigue. Negative for appetite change.  HENT: Positive for rhinorrhea. Negative for congestion and sore throat.   Eyes: Negative.   Respiratory: Positive for shortness of breath (with moderate exertion). Negative for chest tightness.   Cardiovascular: Negative for chest pain, palpitations  and leg swelling.  Gastrointestinal: Negative for abdominal distention and abdominal pain.  Endocrine: Negative.   Genitourinary: Negative.   Musculoskeletal: Positive for arthralgias (right wrist pain with exertion). Negative for back pain.  Skin: Negative.   Allergic/Immunologic: Negative.   Neurological: Negative for dizziness and light-headedness.  Hematological: Negative for adenopathy. Does not bruise/bleed easily.  Psychiatric/Behavioral: Negative for dysphoric mood and sleep disturbance (wears oxygen at 3L around the clock). The patient is nervous/anxious.     Vitals:   08/06/18 1131  BP: (!) 169/82  Pulse: 69  Resp: 18  SpO2: 92%  Weight: 117 lb 8 oz (53.3 kg)  Height: 5\' 4"  (1.626 m)   Wt Readings from Last 3 Encounters:  08/06/18 117 lb 8 oz (53.3 kg)  08/03/18 116 lb (52.6 kg)  07/27/18 113 lb 9.6 oz (51.5 kg)   Lab Results  Component Value Date   CREATININE 0.74 07/27/2018   CREATININE 0.60 07/26/2018   CREATININE 0.55 07/25/2018    Physical Exam  Constitutional: She is oriented to person, place, and time. She appears well-developed and well-nourished.  HENT:  Head: Normocephalic and atraumatic.  Neck: Normal range of motion. Neck supple. No JVD present.   Cardiovascular: Normal rate and regular rhythm.  Pulmonary/Chest: Effort normal. No respiratory distress. She has no wheezes. She has no rhonchi. She has no rales.  Abdominal: Soft. She exhibits no distension.  Musculoskeletal:       Right lower leg: She exhibits no tenderness and no edema.       Left lower leg: She exhibits no tenderness and no edema.  Neurological: She is alert and oriented to person, place, and time.  Skin: Skin is warm and dry.  Psychiatric: Her behavior is normal. Her mood appears anxious.  Nursing note and vitals reviewed.   Assessment & Plan:  1: Chronic heart failure with reduced ejection fraction- - NYHA III - euvolemic today - weighing daily and she was reminded to call for an overnight weight gain of >2 pounds or a weekly weight gain of >5 pounds - not adding salt to her food and the dining hall where she lives at [facility] with salt either. Written information given to her about this so that she can closely follow a 2000mg  sodium diet - does walk with a walker when she thinks about it - says that she's still driving locally; encouraged her to speak with PCP about whether she should continue this or not - EF >40% so would not qualify for entresto use - saw cardiology ([place]) 04/28/18 - BMP 11/01/16 was 597.0 - has an aide for 4 hours every day along with home health nurse/ PT a couple of times/ week  2: HTN- - BP elevated today but, again, she says that she feels anxious - home BP's running 120's/ 70's - saw PCP ([place]) 08/04/18 - BMP 07/27/18 reviewed and showed sodium 135, potassium 3.8, creatinine 0.74 and GFR >60  3: COPD- - saw pulmonologist Raul Del) 06/21/18 - wearing oxygen at 3L around the clock  Patient did not bring her medications nor a list. Each medication was verbally reviewed with the patient and she was encouraged to bring the bottles to every visit to confirm accuracy of list.  Return in 1 month or sooner for any  questions/problems before then.

## 2018-08-06 NOTE — Patient Instructions (Signed)
Continue weighing daily and call for an overnight weight gain of > 2 pounds or a weekly weight gain of >5 pounds. 

## 2018-08-09 ENCOUNTER — Other Ambulatory Visit: Payer: Self-pay | Admitting: Family Medicine

## 2018-08-09 DIAGNOSIS — Z1231 Encounter for screening mammogram for malignant neoplasm of breast: Secondary | ICD-10-CM

## 2018-08-10 DIAGNOSIS — J449 Chronic obstructive pulmonary disease, unspecified: Secondary | ICD-10-CM | POA: Diagnosis not present

## 2018-08-10 DIAGNOSIS — J9601 Acute respiratory failure with hypoxia: Secondary | ICD-10-CM | POA: Diagnosis not present

## 2018-08-16 ENCOUNTER — Other Ambulatory Visit: Payer: Self-pay

## 2018-08-16 DIAGNOSIS — I5022 Chronic systolic (congestive) heart failure: Secondary | ICD-10-CM | POA: Diagnosis not present

## 2018-08-16 DIAGNOSIS — I700 Atherosclerosis of aorta: Secondary | ICD-10-CM | POA: Diagnosis not present

## 2018-08-16 DIAGNOSIS — I10 Essential (primary) hypertension: Secondary | ICD-10-CM | POA: Diagnosis not present

## 2018-08-16 NOTE — Patient Outreach (Signed)
[facility]) Care Management  08/16/2018  Melanie Huffman 05-10-1928 [id number]  Telephone Assessment: Successful telephone encounter to the above patient for 2 week follow-up post initial home visit. Patient states she is doing well. Complains of feeling "weak" however she states she is "getting stronger".   Patient states she continues to walk to lunch with her caregiver daily is weather permits. Utilizes her portable oxygen concentrator. States she discussed exercise with her PCP who suggested she walk on the treadmill in her home "on the lowest setting on days when it is too hot to walk to lunch". Patient states "I am not a morning person so I walk on the treadmill in the afternoons". RN CM cautioned patient regarding utilizing a treadmill while alone. Encouraged patient to use treadmill during the morning when caretaker available to assist with emergency such as fall or extreme shortness of breath. Patient reluctantly agreed.  Continues to weight daily and wears O2 as prescribed. Weight stable at 111 lbs yesterday and today. Patient acknowledges taking increased dose of lasix for 3 lb weigh gain overnight last week. States she has lost the additional weight and denies increased shortness of breath. Continues to watch her sodium however she eats daily in the dinning hall at her independently living community. Patient states they accommodate special diets however the day of initial home visit patient described having kielbasa with sauerkraut. Encouraged patient to discuss diet with facility director.   Patient confirms taking all medications as they are administered/set out for her. Acknowledges appointment with Dr. Ubaldo Glassing this pm. Daughter will provide transportation. Patient has not had BP checked by caretaker this am.  Plan: Routine home visit scheduled for two weeks. Will follow up with patient at that time.  Melanie Huffman E. Rollene Rotunda RN, BSN [facility] Care Management  Coordinator [phone]

## 2018-08-19 DIAGNOSIS — I10 Essential (primary) hypertension: Secondary | ICD-10-CM | POA: Diagnosis not present

## 2018-08-19 DIAGNOSIS — J449 Chronic obstructive pulmonary disease, unspecified: Secondary | ICD-10-CM | POA: Diagnosis not present

## 2018-08-19 DIAGNOSIS — I5022 Chronic systolic (congestive) heart failure: Secondary | ICD-10-CM | POA: Diagnosis not present

## 2018-08-23 DIAGNOSIS — B351 Tinea unguium: Secondary | ICD-10-CM | POA: Diagnosis not present

## 2018-08-23 DIAGNOSIS — M79675 Pain in left toe(s): Secondary | ICD-10-CM | POA: Diagnosis not present

## 2018-08-23 DIAGNOSIS — L600 Ingrowing nail: Secondary | ICD-10-CM | POA: Diagnosis not present

## 2018-08-23 DIAGNOSIS — M79674 Pain in right toe(s): Secondary | ICD-10-CM | POA: Diagnosis not present

## 2018-08-26 DIAGNOSIS — J449 Chronic obstructive pulmonary disease, unspecified: Secondary | ICD-10-CM | POA: Diagnosis not present

## 2018-08-26 DIAGNOSIS — I739 Peripheral vascular disease, unspecified: Secondary | ICD-10-CM | POA: Diagnosis not present

## 2018-08-26 DIAGNOSIS — F0390 Unspecified dementia without behavioral disturbance: Secondary | ICD-10-CM | POA: Diagnosis not present

## 2018-08-26 DIAGNOSIS — M1990 Unspecified osteoarthritis, unspecified site: Secondary | ICD-10-CM | POA: Diagnosis not present

## 2018-08-26 DIAGNOSIS — K219 Gastro-esophageal reflux disease without esophagitis: Secondary | ICD-10-CM | POA: Diagnosis not present

## 2018-08-26 DIAGNOSIS — R1310 Dysphagia, unspecified: Secondary | ICD-10-CM | POA: Diagnosis not present

## 2018-08-26 DIAGNOSIS — I5022 Chronic systolic (congestive) heart failure: Secondary | ICD-10-CM | POA: Diagnosis not present

## 2018-08-26 DIAGNOSIS — I110 Hypertensive heart disease with heart failure: Secondary | ICD-10-CM | POA: Diagnosis not present

## 2018-08-26 DIAGNOSIS — J9611 Chronic respiratory failure with hypoxia: Secondary | ICD-10-CM | POA: Diagnosis not present

## 2018-08-31 NOTE — Progress Notes (Signed)
Patient ID: Melanie Huffman, female    DOB: [birth year], 84 y.o.   MRN: [id number]  HPI  Ms Brassell is a 84 y/o female with a history of asthma, HTN, PVD, COPD, anxiety, previous tobacco use and chronic heart failure.   Echo report from 06/05/18 reviewed and showed an EF of 45% along with mild AR/ MR and elevated PA pressure of 45 mm Hg.   Admitted 07/25/18 due to pulmonary edema. Cardiology consult obtained. Initially needed IV lasix and transitioned to oral diuretics. Discharged after 2 days. Admitted 06/04/18 due to HF and syncope. Cardiology and nephrology consults obtained. Gave IV fluids and then IV lasix. Discharged after 5 days.   She presents today for a follow-up visit with a chief complaint of moderate shortness of breath upon minimal exertion. She describes this as chronic in nature having been present for several years. She has associated fatigue, anxiety and slight weight gain along with this. She denies any difficulty sleeping, abdominal distention, palpitations, pedal edema, chest pain or dizziness.   Past Medical History:  Diagnosis Date  . Anxiety   . Arthritis 2019   mostly in neck  . Asthma   . Breast cancer ([place]) 1991   mastectomy left  . Cancer ([place]) 1991   L Breast  . CHF (congestive heart failure) ([place])   . COPD (chronic obstructive pulmonary disease) ([place])    hypoxia with resp failure admission in 03/2018  . Dementia    confusion beginning  . Emphysema of lung ([place])   . GERD (gastroesophageal reflux disease)   . Heart murmur   . History of hiatal hernia   . Hypertension 03/2018   recent hypertension urgency ..admitted in april 2019  . Hypoxia 03/2018   O2 saturation runs 86-91% on Room Air.  . Intermittent dysphagia   . Peripheral vascular disease ([place])   . Shortness of breath dyspnea   . Traumatic subdural hematoma [facility])    Past Surgical History:  Procedure Laterality Date  . ABDOMINAL HYSTERECTOMY     Partial  . APPENDECTOMY    . BREAST BIOPSY Right     1980 and 1970's  . BREAST SURGERY  1991   L Mastectomy  . EYE SURGERY Bilateral    cataract extraction  . MASTECTOMY    . ORIF PATELLA Left 04/12/2015   Procedure: OPEN REDUCTION INTERNAL (ORIF) FIXATION PATELLA;  Surgeon: Hessie Knows, MD;  Location: [facility] ORS;  Service: Orthopedics;  Laterality: Left;  . ORIF WRIST FRACTURE Right 05/04/2018   Procedure: OPEN REDUCTION INTERNAL FIXATION (ORIF) WRIST FRACTURE;  Surgeon: Hessie Knows, MD;  Location: [facility] ORS;  Service: Orthopedics;  Laterality: Right;  . PERIPHERAL VASCULAR CATHETERIZATION Left 04/17/2015   Procedure: Lower Extremity Angiography;  Surgeon: Katha Cabal, MD;  Location: [place] CV LAB;  Service: Cardiovascular;  Laterality: Left;  . PERIPHERAL VASCULAR CATHETERIZATION  04/17/2015   Procedure: Lower Extremity Intervention;  Surgeon: Katha Cabal, MD;  Location: [place] CV LAB;  Service: Cardiovascular;;  . TONSILLECTOMY     Family History  Problem Relation Age of Onset  . CVA Mother   . Kidney disease Mother   . Heart disease Father   . Kidney disease Cousin 62  . Kidney disease Cousin 31  . Breast cancer Neg Hx    Social History   Tobacco Use  . Smoking status: Former Smoker    Packs/day: 0.50    Years: 20.00    Pack years: 10.00    Types: Cigarettes  Last attempt to quit: 01/26/1979    Years since quitting: 39.6  . Smokeless tobacco: Never Used  Substance Use Topics  . Alcohol use: No   Allergies  Allergen Reactions  . Amlodipine Swelling     [Onset: 08/26/2013]feet and neck begin to swell  . Adhesive [Tape] Other (See Comments)    Skin is very thin and tears easily. Please use paper tape only  . Codeine Nausea Only  . Percocet [Oxycodone-Acetaminophen] Nausea And Vomiting    Patient took on an empty stomach. Good relief but unsettled. Still has not eaten today  . Sulfa Antibiotics Nausea Only and Rash    rash   Prior to Admission medications   Medication Sig Start Date End Date  Taking? Authorizing Provider  acetaminophen (TYLENOL) 325 MG tablet Take 2 tablets (650 mg total) by mouth every 6 (six) hours as needed for mild pain (or Fever >/= 101). 06/09/18  Yes Wieting, Richard, MD  albuterol (PROVENTIL HFA;VENTOLIN HFA) 108 (90 Base) MCG/ACT inhaler Inhale 2 puffs into the lungs every 6 (six) hours as needed for wheezing or shortness of breath.   Yes [provider]  Calcium Carbonate-Vitamin D3 (CALCIUM 600/VITAMIN D) 600-400 MG-UNIT TABS Take 1 tablet by mouth daily.   Yes [provider]  carvedilol (COREG) 25 MG tablet Take 25 mg by mouth 2 (two) times daily with a meal.   Yes [provider]  cloNIDine (CATAPRES) 0.1 MG tablet Take 0.1 mg by mouth 2 (two) times daily. 07/20/18  Yes [provider]  escitalopram (LEXAPRO) 10 MG tablet Take 10 mg by mouth daily.   Yes [provider]  fluticasone furoate-vilanterol (BREO ELLIPTA) 100-25 MCG/INH AEPB Inhale 1 puff into the lungs daily.   Yes [provider]  furosemide (LASIX) 40 MG tablet Take 40 mg by mouth 2 (two) times daily. And additional 20mg  PRN   Yes [provider]  hydrALAZINE (APRESOLINE) 25 MG tablet Take 1 tablet (25 mg total) by mouth every 8 (eight) hours. 06/09/18  Yes Wieting, Richard, MD  mometasone (ELOCON) 0.1 % ointment Apply 1 application topically as needed. 08/26/18  Yes [provider]  Multiple Vitamins-Minerals (CENTRUM SILVER PO) Take 1 tablet by mouth daily.   Yes [provider]  omeprazole (PRILOSEC) 20 MG capsule Take 20 mg by mouth daily.    Yes [provider]  potassium chloride SA (K-DUR,KLOR-CON) 20 MEQ tablet Take 1 tablet (20 mEq total) by mouth daily. 03/09/18  Yes Pyreddy, Reatha Harps, MD  Psyllium (METAMUCIL FIBER PO) Take 5 capsules by mouth 3 (three) times daily as needed.    Yes [provider]  raloxifene (EVISTA) 60 MG tablet Take 60 mg by mouth daily.   Yes [provider]   ramipril (ALTACE) 10 MG capsule Take 20 mg by mouth every morning.   Yes [provider]  tiotropium (SPIRIVA) 18 MCG inhalation capsule Place 18 mcg into inhaler and inhale daily.    Yes [provider]  vitamin C (ASCORBIC ACID) 500 MG tablet Take 500 mg by mouth daily.   Yes [provider]    Review of Systems  Constitutional: Positive for fatigue. Negative for appetite change.  HENT: Positive for hearing loss and rhinorrhea. Negative for congestion and sore throat.   Eyes: Negative.   Respiratory: Positive for shortness of breath (with moderate exertion). Negative for chest tightness.   Cardiovascular: Negative for chest pain, palpitations and leg swelling.  Gastrointestinal: Negative for abdominal distention and abdominal  pain.  Endocrine: Negative.   Genitourinary: Negative.   Musculoskeletal: Positive for arthralgias (right wrist pain with exertion). Negative for back pain.  Skin: Negative.   Allergic/Immunologic: Negative.   Neurological: Negative for dizziness and light-headedness.  Hematological: Negative for adenopathy. Does not bruise/bleed easily.  Psychiatric/Behavioral: Negative for dysphoric mood and sleep disturbance (wears oxygen at 3L around the clock). The patient is nervous/anxious.    Vitals:   09/02/18 1018  BP: (!) 155/63  Pulse: 62  Resp: 18  SpO2: 90%  Weight: 120 lb 4 oz (54.5 kg)  Height: 5\' 4"  (1.626 m)   Wt Readings from Last 3 Encounters:  09/02/18 120 lb 4 oz (54.5 kg)  08/06/18 117 lb 8 oz (53.3 kg)  08/03/18 116 lb (52.6 kg)   Lab Results  Component Value Date   CREATININE 0.74 07/27/2018   CREATININE 0.60 07/26/2018   CREATININE 0.55 07/25/2018    Physical Exam  Constitutional: She is oriented to person, place, and time. She appears well-developed and well-nourished.  HENT:  Head: Normocephalic and atraumatic.  Neck: Normal range of motion. Neck supple. No JVD present.  Cardiovascular: Normal rate and  regular rhythm.  Pulmonary/Chest: Effort normal. No respiratory distress. She has no wheezes. She has no rhonchi. She has no rales.  Abdominal: Soft. She exhibits no distension.  Musculoskeletal:       Right lower leg: She exhibits no tenderness and no edema.       Left lower leg: She exhibits no tenderness and no edema.  Neurological: She is alert and oriented to person, place, and time.  Skin: Skin is warm and dry.  Psychiatric: Her behavior is normal. Her mood appears anxious.  Nursing note and vitals reviewed.   Assessment & Plan:  1: Chronic heart failure with reduced ejection fraction- - NYHA III - euvolemic today - weighing daily and she was reminded to call for an overnight weight gain of >2 pounds or a weekly weight gain of >5 pounds - weight up 3 pounds since she was last here 1 month ago - not adding salt to her food and the dining hall where she lives at [employer] with salt either.  - does walk with a walker when she thinks about it - EF >40% so would not qualify for entresto use - saw cardiology Minette Brine) 09/01/18 - BMP 11/01/16 was 597.0 - has an aide for 4 hours every day along with home health nurse/ PT a couple of times/ week - wearing compression socks daily with removal at bedtime - reports receiving her flu vaccine for this season  2: HTN- - BP mildly elevated today - continue to monitor - saw PCP ([place]) 09/01/18 - BMP 07/27/18 reviewed and showed sodium 135, potassium 3.8, creatinine 0.74 and GFR >60  3: COPD- - saw pulmonologist Raul Del) 06/21/18 - wearing oxygen at 3L around the clock  Patient did not bring her medications nor a list. Each medication was verbally reviewed with the patient and she was encouraged to bring the bottles to every visit to confirm accuracy of list.  Patient opts to not make a return appointment at this time. Advised her that she could call back at anytime to schedule another appointment.

## 2018-09-01 DIAGNOSIS — I5022 Chronic systolic (congestive) heart failure: Secondary | ICD-10-CM | POA: Diagnosis not present

## 2018-09-01 DIAGNOSIS — F411 Generalized anxiety disorder: Secondary | ICD-10-CM | POA: Diagnosis not present

## 2018-09-01 DIAGNOSIS — J449 Chronic obstructive pulmonary disease, unspecified: Secondary | ICD-10-CM | POA: Diagnosis not present

## 2018-09-01 DIAGNOSIS — I10 Essential (primary) hypertension: Secondary | ICD-10-CM | POA: Diagnosis not present

## 2018-09-01 DIAGNOSIS — I700 Atherosclerosis of aorta: Secondary | ICD-10-CM | POA: Diagnosis not present

## 2018-09-02 ENCOUNTER — Ambulatory Visit: Payer: PPO | Attending: Family | Admitting: Family

## 2018-09-02 ENCOUNTER — Encounter: Payer: Self-pay | Admitting: Family

## 2018-09-02 VITALS — BP 155/63 | HR 62 | Resp 18 | Ht 64.0 in | Wt 120.2 lb

## 2018-09-02 DIAGNOSIS — Z882 Allergy status to sulfonamides status: Secondary | ICD-10-CM | POA: Diagnosis not present

## 2018-09-02 DIAGNOSIS — R0602 Shortness of breath: Secondary | ICD-10-CM | POA: Diagnosis not present

## 2018-09-02 DIAGNOSIS — J449 Chronic obstructive pulmonary disease, unspecified: Secondary | ICD-10-CM | POA: Diagnosis not present

## 2018-09-02 DIAGNOSIS — I10 Essential (primary) hypertension: Secondary | ICD-10-CM

## 2018-09-02 DIAGNOSIS — F0390 Unspecified dementia without behavioral disturbance: Secondary | ICD-10-CM | POA: Diagnosis not present

## 2018-09-02 DIAGNOSIS — Z885 Allergy status to narcotic agent status: Secondary | ICD-10-CM | POA: Insufficient documentation

## 2018-09-02 DIAGNOSIS — Z888 Allergy status to other drugs, medicaments and biological substances status: Secondary | ICD-10-CM | POA: Diagnosis not present

## 2018-09-02 DIAGNOSIS — I5022 Chronic systolic (congestive) heart failure: Secondary | ICD-10-CM | POA: Insufficient documentation

## 2018-09-02 DIAGNOSIS — Z79899 Other long term (current) drug therapy: Secondary | ICD-10-CM | POA: Diagnosis not present

## 2018-09-02 DIAGNOSIS — I739 Peripheral vascular disease, unspecified: Secondary | ICD-10-CM | POA: Diagnosis not present

## 2018-09-02 DIAGNOSIS — Z87891 Personal history of nicotine dependence: Secondary | ICD-10-CM | POA: Diagnosis not present

## 2018-09-02 DIAGNOSIS — Z8249 Family history of ischemic heart disease and other diseases of the circulatory system: Secondary | ICD-10-CM | POA: Insufficient documentation

## 2018-09-02 DIAGNOSIS — I110 Hypertensive heart disease with heart failure: Secondary | ICD-10-CM | POA: Insufficient documentation

## 2018-09-02 DIAGNOSIS — F419 Anxiety disorder, unspecified: Secondary | ICD-10-CM | POA: Diagnosis not present

## 2018-09-02 DIAGNOSIS — K219 Gastro-esophageal reflux disease without esophagitis: Secondary | ICD-10-CM | POA: Diagnosis not present

## 2018-09-02 NOTE — Patient Instructions (Signed)
Continue weighing daily and call for an overnight weight gain of > 2 pounds or a weekly weight gain of >5 pounds. 

## 2018-09-03 ENCOUNTER — Other Ambulatory Visit: Payer: Self-pay

## 2018-09-03 NOTE — Patient Outreach (Signed)
[facility]) Care Management   09/03/2018  Melanie Huffman 01-07-1928 [id number]  Melanie Huffman is an 84 y.o. female  Subjective: "I'm doing good" "I'm running out of money paying for help" "You just don't know how much money I have spent and I am just not going to do it any more". "I have lived here for years by myself and done fine and there is no reason I can't keep staying here by myself". Melanie Huffman states to RN CM in private "She gets her medicines mixed up some times and I am worried about her staying here alone". Both [place] and patient acknowledge "this is just a trial to see how I do" Melanie Huffman says her hours will be cut back from daily to Tuesday 8-1, Fridays 8-1, and Sundays 8-11. Melanie Huffman says patient has a cousin near by that can check patient and take patient to MD appointments if needed as patients only child lives in [place]. Daughter remains active in patients care remotely and available for appointments if needed.  Objective:   BP (!) 168/76 (BP Location: Right Arm, Patient Position: Sitting, Cuff Size: Normal)   Pulse (!) 53   Resp 18   Wt 119 lb 12.8 oz (54.3 kg) Comment: reported morning weight  SpO2 98%   BMI 20.56 kg/m   Physical Exam  Constitutional: She is oriented to person, place, and time. She appears well-developed and well-nourished.  Cardiovascular: Normal heart sounds and intact distal pulses.  Regularly irregular rhythm, 2+ pitting edema toes to knees  Respiratory: Effort normal. No respiratory distress.  Breath sounds diminished throughout  GI: Soft. Bowel sounds are normal.  Musculoskeletal: Normal range of motion.  Neurological: She is alert and oriented to person, place, and time.  Skin: Skin is warm and dry.  Psychiatric: She has a normal mood and affect. Her behavior is normal. Judgment and thought content normal.    Encounter Medications:   Outpatient Encounter Medications as of 09/03/2018  Medication Sig Note  . acetaminophen  (TYLENOL) 325 MG tablet Take 2 tablets (650 mg total) by mouth every 6 (six) hours as needed for mild pain (or Fever >/= 101).   Marland Kitchen albuterol (PROVENTIL HFA;VENTOLIN HFA) 108 (90 Base) MCG/ACT inhaler Inhale 2 puffs into the lungs every 6 (six) hours as needed for wheezing or shortness of breath.   . Calcium Carbonate-Vitamin D3 (CALCIUM 600/VITAMIN D) 600-400 MG-UNIT TABS Take 1 tablet by mouth daily.   . carvedilol (COREG) 25 MG tablet Take 25 mg by mouth 2 (two) times daily with a meal.   . cloNIDine (CATAPRES) 0.1 MG tablet Take 0.1 mg by mouth 2 (two) times daily.   Marland Kitchen escitalopram (LEXAPRO) 10 MG tablet Take 10 mg by mouth daily.   . fluticasone furoate-vilanterol (BREO ELLIPTA) 100-25 MCG/INH AEPB Inhale 1 puff into the lungs daily.   . furosemide (LASIX) 40 MG tablet Take 40 mg by mouth 2 (two) times daily. And additional 66m PRN   . hydrALAZINE (APRESOLINE) 25 MG tablet Take 1 tablet (25 mg total) by mouth every 8 (eight) hours. (Patient not taking: Reported on 09/03/2018) 09/03/2018: Discontinued by cardiologist per patients caregiver  . mometasone (ELOCON) 0.1 % ointment Apply 1 application topically as needed.   . Multiple Vitamins-Minerals (CENTRUM SILVER PO) Take 1 tablet by mouth daily.   .Marland Kitchenomeprazole (PRILOSEC) 20 MG capsule Take 20 mg by mouth daily.    . potassium chloride SA (K-DUR,KLOR-CON) 20 MEQ tablet Take 1 tablet (20 mEq total) by  mouth daily.   . Psyllium (METAMUCIL FIBER PO) Take 5 capsules by mouth 3 (three) times daily as needed.    . raloxifene (EVISTA) 60 MG tablet Take 60 mg by mouth daily.   . ramipril (ALTACE) 10 MG capsule Take 20 mg by mouth every morning.   . tiotropium (SPIRIVA) 18 MCG inhalation capsule Place 18 mcg into inhaler and inhale daily.    . vitamin C (ASCORBIC ACID) 500 MG tablet Take 500 mg by mouth daily.    No facility-administered encounter medications on file as of 09/03/2018.     Functional Status:   In your present state of health, do you  have any difficulty performing the following activities: 09/02/2018 08/06/2018  Hearing? N N  Vision? N N  Difficulty concentrating or making decisions? Y N  Walking or climbing stairs? N Y  Dressing or bathing? Y Y  Doing errands, shopping? Y Y  Preparing Food and eating ? - -  Using the Toilet? - -  In the past six months, have you accidently leaked urine? - -  Do you have problems with loss of bowel control? - -  Managing your Medications? - -  Managing your Finances? - -  Housekeeping or managing your Housekeeping? - -  Some recent data might be hidden    Fall/Depression Screening:    Fall Risk  09/02/2018 08/06/2018 08/03/2018  Falls in the past year? Yes Yes Yes  Number falls in past yr: 2 or more 2 or more 2 or more  Injury with Fall? Yes No Yes  Risk Factor Category  High Fall Risk High Fall Risk High Fall Risk  Risk for fall due to : History of fall(s);Impaired balance/gait History of fall(s);Impaired balance/gait History of fall(s);Impaired balance/gait  Follow up - Falls prevention discussed Falls prevention discussed;Education provided;Falls evaluation completed   PHQ 2/9 Scores 09/02/2018 08/06/2018 08/03/2018  PHQ - 2 Score 1 0 0    Assessment:  Patient is sitting in recliner accompanied by Melanie Huffman, patients private caregiver, upon RN CM arrival. Pleasant and well groomed. Patient remains confused about agency RN CM is from during visit. RN CM explains multiple times about [facility] however patient continues to refer to RNs agency as [employer].  Physical assessment as documented above. Per documentation in EMR, patient continues to have days of fluid overload. Insistent that she does not eat any salt as the [employer] does not use salt however today's planned lunch is "breakfast with bacon, eggs, sausage, pancakes". Patient does not understand the concept of "hidden salt". She does not plan to return to the [facility]. "I have been twice and they have not done anything  for me". She plans to call PCP or cardiologist with fluid overload.  Caregiver continues to assist patient with daily weights and BP monitoring and documentation. Weight has remained fairly stable over the last week with <3 lbs fluctuation. BP remians high during this visit at 168/76. Per EMR MD aware but adjusting meds to reported PM lows. RN CM unsure if patient will be able to continue self monitoring once caregiver is not available on a daily basis.  Patient continues to walk for exercise. She continues to use her portable oxygen concentrator. RN CM remains concerned that patient refuses to use the treadmill when caregiver is in the home. "I don't want to exercise in the mornings". RN CM discussed safety concerns with patient especially since patient admits to falling on treadmill previously. Patient understands however is not  willing to change routine. Discussed changing weather and the hopes that patient will soon be able to walk more outside.  After conversation with patient and caregiver, RN CM continues to be concerned about patients ability to take her own medications safely. Medications are not blister packed and the times are written on the packs however patient will still have to take an additional diuretic for weight gain as prescribed.  Patient remains alert and oriented and able to make her own decisions.   Plan: Follow up in one month and PRN  THN CM Care Plan Problem One     Most Recent Value  Care Plan Problem One  High Risk for  hospital readmission related to/ as evidenced by recent hospitalization for HCAP and multiple admission within 6 months  Role Documenting the Problem One  Care Management [place] for Problem One  Not Active  [facility] Long Term Goal   Patient will not experience hospital readmission over the next 31 days   THN Long Term Goal Start Date  08/03/18  [facility] Long Term Goal Met Date  09/03/18  [facility] CM Short Term Goal #1   patient will take all  medications as prescribed over the next 30 days  THN CM Short Term Goal #1 Start Date  08/03/18  [facility] CM Short Term Goal #1 Met Date  09/03/18  THN CM Short Term Goal #2   patient will attend all MD appointments over the next 30 days  THN CM Short Term Goal #2 Start Date  08/03/18  [facility] CM Short Term Goal #2 Met Date  09/03/18    [facility] CM Care Plan Problem Two     Most Recent Value  Care Plan Problem Two  Heart Failure-Self Care  Role Documenting the Problem Two  Care Management [place] for Problem Two  Active  Interventions for Problem Two Long Term Goal   discussed with patient and caregiver importance of daily weights and recording, reviewed low NA diet with patient and gave handout with food examples, discussed taking medications as prescribed as caregiver days will be decreased to three days per week  THN Long Term Goal  Over the next 31 days, patient will not have readmission related to CHF as evidenced by patient reporting and review of EMR  THN Long Term Goal Start Date  09/03/18  THN CM Short Term Goal #1   Over the next 30 days, patient will weigh daily and record as evidenced by review of documentation in [facility] calendar  [facility] CM Short Term Goal #1 Start Date  09/03/18  Interventions for Short Term Goal #2   reviewed with patient how to appropriately weight and record as patients caregiver has been doing it daily for patient  THN CM Short Term Goal #2   Over the next 30 days, patient will take all medicatons as prescribed including PRN lasix as evidenced by patient reporting and review of EMR  THN CM Short Term Goal #2 Start Date  09/03/18  Interventions for Short Term Goal #2  reviewed with patient HF action plan/yellow zone, discussed importance of taking medication on time as written on pill packets  THN CM Short Term Goal #3   Over the next 30 days, patient will be able to verbalize 3 symptoms of HF "yellow zone" and appropriate intervention  THN CM Short Term Goal #3  Start Date  09/03/18  Interventions for Short Term Goal #3  reviewed with patient and caregiver HF zones,  encouraged caregiver to reinforce education frequently, discussed consequenses of not calling MD and/or taking additional diuretic as prescribed when in yellow zone  THN CM Short Term Goal #4  Over the next 30 days, patient will be able to verbalizes examples of foods that are high in hidden sodium  THN CM Short Term Goal #4 Start Date  09/03/18  Interventions of Short Term Goal #4  gave patient examples of foods with hidden sodium, discussed with patient effects of hidden sodium on fluid retention and weight gain, gave patient pictorial handout with examples of high sodium foods and low sodium foods     Elizabella Nolet E. Rollene Rotunda RN, BSN [facility] Care Management Coordinator [phone]

## 2018-09-04 DIAGNOSIS — J449 Chronic obstructive pulmonary disease, unspecified: Secondary | ICD-10-CM | POA: Diagnosis not present

## 2018-09-04 DIAGNOSIS — J9601 Acute respiratory failure with hypoxia: Secondary | ICD-10-CM | POA: Diagnosis not present

## 2018-09-09 DIAGNOSIS — J9601 Acute respiratory failure with hypoxia: Secondary | ICD-10-CM | POA: Diagnosis not present

## 2018-09-09 DIAGNOSIS — J449 Chronic obstructive pulmonary disease, unspecified: Secondary | ICD-10-CM | POA: Diagnosis not present

## 2018-09-10 ENCOUNTER — Emergency Department
Admission: EM | Admit: 2018-09-10 | Discharge: 2018-09-10 | Disposition: A | Discharge status: Home / Self Care | Payer: PPO | Attending: Emergency Medicine | Admitting: Emergency Medicine

## 2018-09-10 ENCOUNTER — Other Ambulatory Visit: Payer: Self-pay

## 2018-09-10 ENCOUNTER — Emergency Department: Payer: PPO

## 2018-09-10 DIAGNOSIS — R0602 Shortness of breath: Secondary | ICD-10-CM

## 2018-09-10 DIAGNOSIS — Z87891 Personal history of nicotine dependence: Secondary | ICD-10-CM | POA: Insufficient documentation

## 2018-09-10 DIAGNOSIS — J441 Chronic obstructive pulmonary disease with (acute) exacerbation: Secondary | ICD-10-CM | POA: Insufficient documentation

## 2018-09-10 DIAGNOSIS — Z79899 Other long term (current) drug therapy: Secondary | ICD-10-CM | POA: Diagnosis not present

## 2018-09-10 DIAGNOSIS — I5022 Chronic systolic (congestive) heart failure: Secondary | ICD-10-CM | POA: Insufficient documentation

## 2018-09-10 DIAGNOSIS — Z853 Personal history of malignant neoplasm of breast: Secondary | ICD-10-CM | POA: Insufficient documentation

## 2018-09-10 DIAGNOSIS — I110 Hypertensive heart disease with heart failure: Secondary | ICD-10-CM | POA: Insufficient documentation

## 2018-09-10 LAB — BASIC METABOLIC PANEL
Anion gap: 13 (ref 5–15)
BUN: 9 mg/dL (ref 8–23)
CALCIUM: 8.5 mg/dL — AB (ref 8.9–10.3)
CHLORIDE: 90 mmol/L — AB (ref 98–111)
CO2: 30 mmol/L (ref 22–32)
CREATININE: 0.65 mg/dL (ref 0.44–1.00)
GFR calc non Af Amer: >60 mL/min (ref >60)
GLUCOSE: 154 mg/dL — AB (ref 70–99)
Potassium: 4.1 mmol/L (ref 3.5–5.1)
Sodium: 133 mmol/L — ABNORMAL LOW (ref 135–145)

## 2018-09-10 LAB — CBC
HCT: 37.1 % (ref 36.0–46.0)
Hemoglobin: 12 g/dL (ref 12.0–15.0)
MCH: 30.1 pg (ref 26.0–34.0)
MCHC: 32.3 g/dL (ref 30.0–36.0)
MCV: 93 fL (ref 80.0–100.0)
Platelets: 139 10*3/uL — ABNORMAL LOW (ref 150–400)
RBC: 3.99 MIL/uL (ref 3.87–5.11)
RDW: 13.5 % (ref 11.5–15.5)
WBC: 4.2 10*3/uL (ref 4.0–10.5)
nRBC: 0 % (ref 0.0–0.2)

## 2018-09-10 LAB — TROPONIN I

## 2018-09-10 MED ORDER — PREDNISONE 10 MG (21) PO TBPK: ORAL_TABLET | ORAL | 0 refills | Status: DC

## 2018-09-10 MED ORDER — PREDNISONE 20 MG PO TABS
60.0000 mg | ORAL_TABLET | Freq: Once | ORAL | Status: AC
  Administered 2018-09-10: 60 mg via ORAL
  Filled 2018-09-10: qty 3

## 2018-09-10 MED ORDER — METHYLPREDNISOLONE SODIUM SUCC 125 MG IJ SOLR: 125.0000 mg | Freq: Once | INTRAMUSCULAR | Status: DC

## 2018-09-10 MED ORDER — IPRATROPIUM-ALBUTEROL 0.5-2.5 (3) MG/3ML IN SOLN
3.0000 mL | Freq: Once | RESPIRATORY_TRACT | Status: AC
  Administered 2018-09-10: 3 mL via RESPIRATORY_TRACT
  Filled 2018-09-10: qty 3

## 2018-09-10 NOTE — ED Triage Notes (Signed)
Pt is here with caregiver with c/o increased SOB for the past couple days. States her sats were 87%on her normal 2.5L [place] abd b/p was elevated this morning with a 3lb wt gain. Pt is in NAD on arrival on 3L [place] with sats 96%

## 2018-09-10 NOTE — ED Provider Notes (Signed)
[facility] Emergency Department Provider Note  ____________________________________________   I have reviewed the triage vital signs and the nursing notes.   HISTORY  Chief Complaint Shortness of Breath   History limited by: Not Limited   HPI Melanie Huffman is a 84 y.o. female who presents to the emergency department today because of concerns for shortness of breath.  Patient caretaker states that her breathing is gotten worse over the past roughly 3 days.  Caregivers also noted a slight weight gain.  The patient does have some swelling bilateral feet although this is somewhat chronic issue.  Caregiver states that they did recently go up on her Lasix.  Patient denies any chest pain.  She has not had any fevers.   Per medical record review patient has a history of CHF, COPD  Past Medical History:  Diagnosis Date  . Anxiety   . Arthritis 2019   mostly in neck  . Asthma   . Breast cancer ([place]) 1991   mastectomy left  . Cancer ([place]) 1991   L Breast  . CHF (congestive heart failure) ([place])   . COPD (chronic obstructive pulmonary disease) ([place])    hypoxia with resp failure admission in 03/2018  . Dementia ([place])    confusion beginning  . Emphysema of lung ([place])   . GERD (gastroesophageal reflux disease)   . Heart murmur   . History of hiatal hernia   . Hypertension 03/2018   recent hypertension urgency ..admitted in april 2019  . Hypoxia 03/2018   O2 saturation runs 86-91% on Room Air.  . Intermittent dysphagia   . Peripheral vascular disease ([place])   . Shortness of breath dyspnea   . Traumatic subdural hematoma [facility])     Patient Active Problem List   Diagnosis Date Noted  . Chronic systolic heart failure ([place]) 08/06/2018  . Syncope 06/04/2018  . Accelerated hypertension 03/07/2018  . Acute systolic CHF (congestive heart failure) ([place]) 03/07/2018  . Mass of upper lobe of left lung 11/09/2016  . HTN (hypertension) 10/31/2016  . PVD (peripheral  vascular disease) ([place]) 10/31/2016  . COPD (chronic obstructive pulmonary disease) ([place]) 10/31/2016  . GERD (gastroesophageal reflux disease) 10/31/2016  . Hyponatremia 07/10/2016  . Hypokalemia 07/10/2016  . Cellulitis of leg, left 07/09/2016  . Subdural hematoma ([place]) 07/26/2014    Past Surgical History:  Procedure Laterality Date  . ABDOMINAL HYSTERECTOMY     Partial  . APPENDECTOMY    . BREAST BIOPSY Right    1980 and 1970's  . BREAST SURGERY  1991   L Mastectomy  . EYE SURGERY Bilateral    cataract extraction  . MASTECTOMY    . ORIF PATELLA Left 04/12/2015   Procedure: OPEN REDUCTION INTERNAL (ORIF) FIXATION PATELLA;  Surgeon: Hessie Knows, MD;  Location: [facility] ORS;  Service: Orthopedics;  Laterality: Left;  . ORIF WRIST FRACTURE Right 05/04/2018   Procedure: OPEN REDUCTION INTERNAL FIXATION (ORIF) WRIST FRACTURE;  Surgeon: Hessie Knows, MD;  Location: [facility] ORS;  Service: Orthopedics;  Laterality: Right;  . PERIPHERAL VASCULAR CATHETERIZATION Left 04/17/2015   Procedure: Lower Extremity Angiography;  Surgeon: Katha Cabal, MD;  Location: [place] CV LAB;  Service: Cardiovascular;  Laterality: Left;  . PERIPHERAL VASCULAR CATHETERIZATION  04/17/2015   Procedure: Lower Extremity Intervention;  Surgeon: Katha Cabal, MD;  Location: [place] CV LAB;  Service: Cardiovascular;;  . TONSILLECTOMY      Prior to Admission medications   Medication Sig Start Date End Date Taking? Authorizing  Provider  acetaminophen (TYLENOL) 325 MG tablet Take 2 tablets (650 mg total) by mouth every 6 (six) hours as needed for mild pain (or Fever >/= 101). 06/09/18   Loletha Grayer, MD  albuterol (PROVENTIL HFA;VENTOLIN HFA) 108 (90 Base) MCG/ACT inhaler Inhale 2 puffs into the lungs every 6 (six) hours as needed for wheezing or shortness of breath.    [provider]  Calcium Carbonate-Vitamin D3 (CALCIUM 600/VITAMIN D) 600-400 MG-UNIT TABS Take 1 tablet by mouth daily.     [provider]  carvedilol (COREG) 25 MG tablet Take 25 mg by mouth 2 (two) times daily with a meal.    [provider]  cloNIDine (CATAPRES) 0.1 MG tablet Take 0.1 mg by mouth 2 (two) times daily. 07/20/18   [provider]  escitalopram (LEXAPRO) 10 MG tablet Take 10 mg by mouth daily.    [provider]  fluticasone furoate-vilanterol (BREO ELLIPTA) 100-25 MCG/INH AEPB Inhale 1 puff into the lungs daily.    [provider]  furosemide (LASIX) 40 MG tablet Take 40 mg by mouth 2 (two) times daily. And additional 20mg  PRN    [provider]  hydrALAZINE (APRESOLINE) 25 MG tablet Take 1 tablet (25 mg total) by mouth every 8 (eight) hours. Patient not taking: Reported on 09/03/2018 06/09/18   Loletha Grayer, MD  mometasone (ELOCON) 0.1 % ointment Apply 1 application topically as needed. 08/26/18   [provider]  Multiple Vitamins-Minerals (CENTRUM SILVER PO) Take 1 tablet by mouth daily.    [provider]  omeprazole (PRILOSEC) 20 MG capsule Take 20 mg by mouth daily.     [provider]  potassium chloride SA (K-DUR,KLOR-CON) 20 MEQ tablet Take 1 tablet (20 mEq total) by mouth daily. 03/09/18   Saundra Shelling, MD  Psyllium (METAMUCIL FIBER PO) Take 5 capsules by mouth 3 (three) times daily as needed.     [provider]  raloxifene (EVISTA) 60 MG tablet Take 60 mg by mouth daily.    [provider]  ramipril (ALTACE) 10 MG capsule Take 20 mg by mouth every morning.    [provider]  tiotropium (SPIRIVA) 18 MCG inhalation capsule Place 18 mcg into inhaler and inhale daily.     [provider]  vitamin C (ASCORBIC ACID) 500 MG tablet Take 500 mg by mouth daily.    [provider]    Allergies Amlodipine; Adhesive [tape]; Codeine; Percocet [oxycodone-acetaminophen]; and Sulfa antibiotics  Family History  Problem Relation Age of Onset  . CVA Mother   . Kidney disease  Mother   . Heart disease Father   . Kidney disease Cousin 38  . Kidney disease Cousin 37  . Breast cancer Neg Hx     Social History Social History   Tobacco Use  . Smoking status: Former Smoker    Packs/day: 0.50    Years: 20.00    Pack years: 10.00    Types: Cigarettes    Last attempt to quit: 01/26/1979    Years since quitting: 39.6  . Smokeless tobacco: Never Used  Substance Use Topics  . Alcohol use: No  . Drug use: No    Review of Systems Constitutional: No fever/chills Eyes: No visual changes. ENT: No sore throat. Cardiovascular: Denies chest pain. Respiratory: Positive for shortness of breath. Gastrointestinal: No abdominal pain.  No nausea, no vomiting.  No diarrhea.   Genitourinary: Negative for dysuria. Musculoskeletal: Negative for back pain. Positive for leg swelling.  Skin: Negative for  rash. Neurological: Negative for headaches, focal weakness or numbness.  ____________________________________________   PHYSICAL EXAM:  VITAL SIGNS: ED Triage Vitals  Enc Vitals Group     BP 09/10/18 1340 (!) 170/72     Pulse Rate 09/10/18 1340 70     Resp 09/10/18 1340 18     Temp 09/10/18 1340 97.7 F (36.5 C)     Temp Source 09/10/18 1340 Oral     SpO2 09/10/18 1340 96 %     Weight 09/10/18 1341 118 lb (53.5 kg)     Height 09/10/18 1341 5\' 4"  (1.626 m)     Head Circumference --      Peak Flow --      Pain Score 09/10/18 1341 0   Constitutional: Alert and oriented.  Eyes: Conjunctivae are normal.  ENT      Head: Normocephalic and atraumatic.      Nose: No congestion/rhinnorhea.      Mouth/Throat: Mucous membranes are moist.      Neck: No stridor. Hematological/Lymphatic/Immunilogical: No cervical lymphadenopathy. Cardiovascular: Normal rate, regular rhythm.  No murmurs, rubs, or gallops.  Respiratory: Normal respiratory effort without tachypnea nor retractions. Breath sounds are clear and equal bilaterally. No wheezes/rales/rhonchi. Gastrointestinal:  Soft and non tender. No rebound. No guarding.  Genitourinary: Deferred Musculoskeletal: Normal range of motion in all extremities. No lower extremity edema. Neurologic:  Normal speech and language. No gross focal neurologic deficits are appreciated.  Skin:  Skin is warm, dry and intact. No rash noted. Psychiatric: Mood and affect are normal. Speech and behavior are normal. Patient exhibits appropriate insight and judgment.  ____________________________________________    LABS (pertinent positives/negatives)  Trop <0.03 CBC wnl except plt 139 BMP na 133, k 4.1, glu 154  ____________________________________________   EKG  I, Nance Pear, attending physician, personally viewed and interpreted this EKG  EKG Time: 1347 Rate: 70 Rhythm: sinus rhythm Axis: normal Intervals: qtc 432 QRS: narrow ST changes: no st elevation Impression: normal ekg   ____________________________________________    RADIOLOGY  CXR Small bilateral effusions  ____________________________________________   PROCEDURES  Procedures  ____________________________________________   INITIAL IMPRESSION / ASSESSMENT AND PLAN / ED COURSE  Pertinent labs & imaging results that were available during my care of the patient were reviewed by me and considered in my medical decision making (see chart for details).   Patient presented to the emergency department today because of concerns for shortness of breath.  She does have a history of COPD.  Patient not in any acute respiratory distress here.  No wheezing noted on exam although did give patient trial of DuoNeb.  She did feel better after this breathing treatment.  X-ray without any concerning signs of infection pneumothorax.  At this point I do think mild COPD exacerbation likely.  Will start on steroids.  ____________________________________________   FINAL CLINICAL IMPRESSION(S) / ED DIAGNOSES  Final diagnoses:  COPD exacerbation ([place])  SOB  (shortness of breath)     Note: This dictation was prepared with Dragon dictation. Any transcriptional errors that result from this process are unintentional     Nance Pear, MD 09/11/18 [phone]

## 2018-09-10 NOTE — Discharge Instructions (Addendum)
Please seek medical attention for any high fevers, chest pain, shortness of breath, change in behavior, persistent vomiting, bloody stool or any other new or concerning symptoms.  

## 2018-09-13 ENCOUNTER — Other Ambulatory Visit: Payer: Self-pay

## 2018-09-13 NOTE — Patient Outreach (Signed)
[facility]) Care Management  09/13/2018  Melanie Huffman 1928-01-02 [id number]  Telephone Assessment: Successful telephone encounter to the above patient to follow up on medical status post ED visit on Friday 09/10/18. Patient was eating breakfast and gave verbal consent to speak to [place], patients paid caregiver.   Rosemarie Ax states patient's breathing has much improved. She has filled her steroid prescription and taking as prescribed. Patient has not needed her rescue inhaler since discharge from ED. Rosemarie Ax has not checked patients BP this morning as patient was eager to eat after awakening. Patients weight is down 4 lbs this morning from 118 yesterday to 114 which is baseline. Pt's weight was up 3 lbs overnight in ED.  Rosemarie Ax is concerned that patient may have fallen over the weekend and has discussed with daughter. Rosemarie Ax states patient has a new skin tear on her right knee and bruises on her arms, however patient denies knowing how injuries occurred.   Plan: Will follow up with patient next month and PRN  Amandajo Gonder E. Rollene Rotunda RN, BSN [facility] Care Management Coordinator [phone]

## 2018-09-16 ENCOUNTER — Ambulatory Visit
Admission: RE | Admit: 2018-09-16 | Discharge: 2018-09-16 | Disposition: A | Discharge status: Home / Self Care | Payer: PPO | Source: Ambulatory Visit | Attending: Family Medicine | Admitting: Family Medicine

## 2018-09-16 DIAGNOSIS — Z1231 Encounter for screening mammogram for malignant neoplasm of breast: Secondary | ICD-10-CM | POA: Insufficient documentation

## 2018-10-05 DIAGNOSIS — J449 Chronic obstructive pulmonary disease, unspecified: Secondary | ICD-10-CM | POA: Diagnosis not present

## 2018-10-05 DIAGNOSIS — J9601 Acute respiratory failure with hypoxia: Secondary | ICD-10-CM | POA: Diagnosis not present

## 2018-10-10 DIAGNOSIS — J449 Chronic obstructive pulmonary disease, unspecified: Secondary | ICD-10-CM | POA: Diagnosis not present

## 2018-10-10 DIAGNOSIS — J9601 Acute respiratory failure with hypoxia: Secondary | ICD-10-CM | POA: Diagnosis not present

## 2018-10-18 ENCOUNTER — Other Ambulatory Visit: Payer: Self-pay | Admitting: *Deleted

## 2018-10-18 NOTE — Patient Outreach (Addendum)
[facility]) Care Management  10/18/2018  Melanie Huffman Oct 24, 1928 [id number]   Telephone assessment  Referral received from prior [facility], Trish Fountain , for telephone follow up in November.   PMHx. Significant for inpatient admission 8/25-27 for HCAP.  History of COPD, Hypertension, CHF, Chronic respiratory failure.    Unsuccessful outreach call to patient , able to leave a HIPAA compliant message for return call.   Placed call to patient daughter Analei Whinery, patient daughter and listed on [facility] consent , no answer able to leave a HIPAA compliant message for return call.     Addendum Incoming return call from patient daughter Haniah Penny, HIPAA verifiedm introduced self and reason for the call.   Daughter discussed the patient has improved much medically, reports that she continues to wear her oxygen, and weigh herself she is not aware of any sudden increases in weight and was doing well from when she talked with her last night.  She states patient has recent visit to her home in [place] in the last week and did very well.   Daughter discussed that this may not have been a good day to call patient, discussed patient lost her caregiver , Rosemarie Ax , that has been with the patient over the last few months they were very close and claudia was wonderful for her. Family will not be able to hire caregiver due to prior contract.  Daughter states she did not call her purposefully today and plans to return call later on tonight.   Daughter discussed patient has appointment with Dr.Fath cardiology on this week and patient plans to drive herself, they have transportation service at senior facility but patient will usually uses it for trips to grocery store .  Patient still has her licence and drives short distances. K[place]ner states that she may end up coming from [place] to drive her to appointment. She states doctor has discussed with patient moving to  [facility] closer to her daughter , daughter feels patient will miss her friends and routine at center.   Daughter discussed it may be best to wait until next week to follow up with patient.   Plan  Will plan return call to patient in the next  2 weeks  Reviewed my contact number with patient daughter if new concerns arise.    Joylene Draft, RN, [place] Management Coordinator  [phone]- Mobile [phone]- Toll Free Main Office

## 2018-10-21 ENCOUNTER — Ambulatory Visit: Payer: Self-pay | Admitting: *Deleted

## 2018-10-21 DIAGNOSIS — M79675 Pain in left toe(s): Secondary | ICD-10-CM | POA: Diagnosis not present

## 2018-10-21 DIAGNOSIS — B351 Tinea unguium: Secondary | ICD-10-CM | POA: Diagnosis not present

## 2018-10-21 DIAGNOSIS — M79674 Pain in right toe(s): Secondary | ICD-10-CM | POA: Diagnosis not present

## 2018-11-04 ENCOUNTER — Other Ambulatory Visit: Payer: Self-pay | Admitting: *Deleted

## 2018-11-04 DIAGNOSIS — J9601 Acute respiratory failure with hypoxia: Secondary | ICD-10-CM | POA: Diagnosis not present

## 2018-11-04 DIAGNOSIS — J449 Chronic obstructive pulmonary disease, unspecified: Secondary | ICD-10-CM | POA: Diagnosis not present

## 2018-11-04 NOTE — Patient Outreach (Signed)
[facility]) Care Management  11/04/2018  Melanie Huffman Oct 19, 1928 [id number]   Case closure   Referral received from prior [facility] Coordinator, Melanie Huffman , for telephone follow up.   PMHx. Significant for inpatient admission 8/25-27 for HCAP.  History of COPD, Hypertension, CHF, Chronic respiratory failure.   Successful outreach call to patient , patient discussed doing pretty good on today. Patient discussed she was able to have  thanskgivings with a cousin, her daughter was called out of town.  Patient discussed that she no longer has hired Office manager Melanie Huffman but feels she is managing okay living in her independent care apartment. Patient states that she drives very little now , the center does provide transportation as needed and her daughter is available to assist to appointments as needed.  Patient discussed Heart Failure  She continues to weigh daily and keep a record , today's weight is 114 which is at her baseline. Patient denies increase in shortness of breath, has very little  lower leg swelling states that she wears compression hose to keep in under control .Patient able states when to call MD for worsening of Heart failure symptoms of increased shortness of breath, weight gain of 3 pounds in day or 5 in a week.  Patient reports that she monitors her blood pressure a few times a week,not as often has she weighs,  has not checked it yet today and states that it is running about the same,when asked further about readings she states okay it okay.  Patient discussed taking medications as prescribed, he pills now come in bubble packaging.   COPD Patient continues to wear oxygen at 2 liters, reports that her breathing is at her usual . Patient denies increase in cough or change in sputum production . Patient denies use of rescue inhaler on this week.   Fall risk Patient denies having a recent fall, reports that she does not use a cane.    Patient denies any new concerns at this time, and feels that she is managing well at her home. Discussed transition to health coach for continued education and support of chronic medical conditions. Placed call to patient daughter Melanie Huffman to discuss plans, she denies any new concerns and feels that her mother does very well on self monitoring and identifying when she needs to seek medical attention.Verifed that daughter has [facility] contact information if new concerns arise. Daughter is in agreement with current plan.   Plan  Will plan case closure of complex care management and transition to health coach.  Will send PCP discipline closure letter .

## 2018-11-09 DIAGNOSIS — J9601 Acute respiratory failure with hypoxia: Secondary | ICD-10-CM | POA: Diagnosis not present

## 2018-11-09 DIAGNOSIS — J449 Chronic obstructive pulmonary disease, unspecified: Secondary | ICD-10-CM | POA: Diagnosis not present

## 2018-11-15 DIAGNOSIS — J449 Chronic obstructive pulmonary disease, unspecified: Secondary | ICD-10-CM | POA: Diagnosis not present

## 2018-11-15 DIAGNOSIS — R0609 Other forms of dyspnea: Secondary | ICD-10-CM | POA: Diagnosis not present

## 2018-12-02 DIAGNOSIS — C44629 Squamous cell carcinoma of skin of left upper limb, including shoulder: Secondary | ICD-10-CM | POA: Diagnosis not present

## 2018-12-02 DIAGNOSIS — D485 Neoplasm of uncertain behavior of skin: Secondary | ICD-10-CM | POA: Diagnosis not present

## 2018-12-02 DIAGNOSIS — I872 Venous insufficiency (chronic) (peripheral): Secondary | ICD-10-CM | POA: Diagnosis not present

## 2018-12-03 ENCOUNTER — Ambulatory Visit: Payer: Self-pay

## 2018-12-05 DIAGNOSIS — J9601 Acute respiratory failure with hypoxia: Secondary | ICD-10-CM | POA: Diagnosis not present

## 2018-12-05 DIAGNOSIS — J449 Chronic obstructive pulmonary disease, unspecified: Secondary | ICD-10-CM | POA: Diagnosis not present

## 2018-12-07 ENCOUNTER — Other Ambulatory Visit: Payer: Self-pay

## 2018-12-07 ENCOUNTER — Emergency Department: Payer: PPO

## 2018-12-07 ENCOUNTER — Encounter: Payer: Self-pay | Admitting: Emergency Medicine

## 2018-12-07 ENCOUNTER — Inpatient Hospital Stay
Admission: EM | Admit: 2018-12-07 | Discharge: 2018-12-10 | DRG: 189 | Disposition: A | Discharge status: Home Health | Payer: PPO | Attending: Internal Medicine | Admitting: Internal Medicine

## 2018-12-07 DIAGNOSIS — Z823 Family history of stroke: Secondary | ICD-10-CM

## 2018-12-07 DIAGNOSIS — Z87891 Personal history of nicotine dependence: Secondary | ICD-10-CM

## 2018-12-07 DIAGNOSIS — Z888 Allergy status to other drugs, medicaments and biological substances status: Secondary | ICD-10-CM

## 2018-12-07 DIAGNOSIS — J439 Emphysema, unspecified: Secondary | ICD-10-CM | POA: Diagnosis present

## 2018-12-07 DIAGNOSIS — Z853 Personal history of malignant neoplasm of breast: Secondary | ICD-10-CM

## 2018-12-07 DIAGNOSIS — R05 Cough: Secondary | ICD-10-CM | POA: Diagnosis not present

## 2018-12-07 DIAGNOSIS — Z66 Do not resuscitate: Secondary | ICD-10-CM | POA: Diagnosis not present

## 2018-12-07 DIAGNOSIS — R0602 Shortness of breath: Secondary | ICD-10-CM | POA: Diagnosis not present

## 2018-12-07 DIAGNOSIS — F0390 Unspecified dementia without behavioral disturbance: Secondary | ICD-10-CM | POA: Diagnosis present

## 2018-12-07 DIAGNOSIS — J9621 Acute and chronic respiratory failure with hypoxia: Principal | ICD-10-CM | POA: Diagnosis present

## 2018-12-07 DIAGNOSIS — J9622 Acute and chronic respiratory failure with hypercapnia: Secondary | ICD-10-CM | POA: Diagnosis not present

## 2018-12-07 DIAGNOSIS — J90 Pleural effusion, not elsewhere classified: Secondary | ICD-10-CM | POA: Diagnosis not present

## 2018-12-07 DIAGNOSIS — L89521 Pressure ulcer of left ankle, stage 1: Secondary | ICD-10-CM | POA: Diagnosis not present

## 2018-12-07 DIAGNOSIS — Z841 Family history of disorders of kidney and ureter: Secondary | ICD-10-CM | POA: Diagnosis not present

## 2018-12-07 DIAGNOSIS — Z7952 Long term (current) use of systemic steroids: Secondary | ICD-10-CM

## 2018-12-07 DIAGNOSIS — M1990 Unspecified osteoarthritis, unspecified site: Secondary | ICD-10-CM | POA: Diagnosis not present

## 2018-12-07 DIAGNOSIS — Z90710 Acquired absence of both cervix and uterus: Secondary | ICD-10-CM

## 2018-12-07 DIAGNOSIS — I10 Essential (primary) hypertension: Secondary | ICD-10-CM | POA: Diagnosis not present

## 2018-12-07 DIAGNOSIS — F419 Anxiety disorder, unspecified: Secondary | ICD-10-CM | POA: Diagnosis not present

## 2018-12-07 DIAGNOSIS — R531 Weakness: Secondary | ICD-10-CM | POA: Diagnosis not present

## 2018-12-07 DIAGNOSIS — Z882 Allergy status to sulfonamides status: Secondary | ICD-10-CM | POA: Diagnosis not present

## 2018-12-07 DIAGNOSIS — I5022 Chronic systolic (congestive) heart failure: Secondary | ICD-10-CM | POA: Diagnosis present

## 2018-12-07 DIAGNOSIS — Z91048 Other nonmedicinal substance allergy status: Secondary | ICD-10-CM

## 2018-12-07 DIAGNOSIS — Z79899 Other long term (current) drug therapy: Secondary | ICD-10-CM

## 2018-12-07 DIAGNOSIS — E871 Hypo-osmolality and hyponatremia: Secondary | ICD-10-CM | POA: Diagnosis not present

## 2018-12-07 DIAGNOSIS — R0902 Hypoxemia: Secondary | ICD-10-CM | POA: Diagnosis not present

## 2018-12-07 DIAGNOSIS — R062 Wheezing: Secondary | ICD-10-CM | POA: Diagnosis not present

## 2018-12-07 DIAGNOSIS — Z803 Family history of malignant neoplasm of breast: Secondary | ICD-10-CM

## 2018-12-07 DIAGNOSIS — Z9012 Acquired absence of left breast and nipple: Secondary | ICD-10-CM | POA: Diagnosis not present

## 2018-12-07 DIAGNOSIS — I739 Peripheral vascular disease, unspecified: Secondary | ICD-10-CM | POA: Diagnosis present

## 2018-12-07 DIAGNOSIS — K219 Gastro-esophageal reflux disease without esophagitis: Secondary | ICD-10-CM | POA: Diagnosis present

## 2018-12-07 DIAGNOSIS — Z8249 Family history of ischemic heart disease and other diseases of the circulatory system: Secondary | ICD-10-CM

## 2018-12-07 DIAGNOSIS — Z9981 Dependence on supplemental oxygen: Secondary | ICD-10-CM | POA: Diagnosis not present

## 2018-12-07 DIAGNOSIS — I110 Hypertensive heart disease with heart failure: Secondary | ICD-10-CM | POA: Diagnosis not present

## 2018-12-07 DIAGNOSIS — J449 Chronic obstructive pulmonary disease, unspecified: Secondary | ICD-10-CM | POA: Diagnosis not present

## 2018-12-07 DIAGNOSIS — R5381 Other malaise: Secondary | ICD-10-CM | POA: Diagnosis not present

## 2018-12-07 DIAGNOSIS — J441 Chronic obstructive pulmonary disease with (acute) exacerbation: Secondary | ICD-10-CM | POA: Diagnosis not present

## 2018-12-07 DIAGNOSIS — J9601 Acute respiratory failure with hypoxia: Secondary | ICD-10-CM | POA: Diagnosis not present

## 2018-12-07 DIAGNOSIS — J9600 Acute respiratory failure, unspecified whether with hypoxia or hypercapnia: Secondary | ICD-10-CM | POA: Diagnosis present

## 2018-12-07 LAB — BASIC METABOLIC PANEL
ANION GAP: 6 (ref 5–15)
BUN: 10 mg/dL (ref 8–23)
CALCIUM: 8.4 mg/dL — AB (ref 8.9–10.3)
CO2: 31 mmol/L (ref 22–32)
Chloride: 93 mmol/L — ABNORMAL LOW (ref 98–111)
Creatinine, Ser: 0.61 mg/dL (ref 0.44–1.00)
Glucose, Bld: 154 mg/dL — ABNORMAL HIGH (ref 70–99)
POTASSIUM: 4.5 mmol/L (ref 3.5–5.1)
SODIUM: 130 mmol/L — AB (ref 135–145)

## 2018-12-07 LAB — BLOOD GAS, VENOUS
Acid-Base Excess: 6.4 mmol/L — ABNORMAL HIGH (ref 0.0–2.0)
Acid-Base Excess: 4.9 mmol/L — ABNORMAL HIGH (ref 0.0–2.0)
Bicarbonate: 35.2 mmol/L — ABNORMAL HIGH (ref 20.0–28.0)
Bicarbonate: 32.7 mmol/L — ABNORMAL HIGH (ref 20.0–28.0)
O2 SAT: 95 %
O2 Saturation: 97 %
Patient temperature: 37
Patient temperature: 37
pCO2, Ven: 70 mmHg — ABNORMAL HIGH (ref 44.0–60.0)
pCO2, Ven: 62 mmHg — ABNORMAL HIGH (ref 44.0–60.0)
pH, Ven: 7.31 (ref 7.250–7.430)
pH, Ven: 7.33 (ref 7.250–7.430)
pO2, Ven: 98 mmHg — ABNORMAL HIGH (ref 32.0–45.0)
pO2, Ven: 81 mmHg — ABNORMAL HIGH (ref 32.0–45.0)

## 2018-12-07 LAB — CBC
HCT: 42.6 % (ref 36.0–46.0)
HEMOGLOBIN: 13.6 g/dL (ref 12.0–15.0)
MCH: 30 pg (ref 26.0–34.0)
MCHC: 31.9 g/dL (ref 30.0–36.0)
MCV: 93.8 fL (ref 80.0–100.0)
NRBC: 0 % (ref 0.0–0.2)
PLATELETS: 95 10*3/uL — AB (ref 150–400)
RBC: 4.54 MIL/uL (ref 3.87–5.11)
RDW: 13.5 % (ref 11.5–15.5)
WBC: 3.7 10*3/uL — AB (ref 4.0–10.5)

## 2018-12-07 LAB — URINALYSIS, COMPLETE (UACMP) WITH MICROSCOPIC
Bacteria, UA: NONE SEEN
Bilirubin Urine: NEGATIVE
Glucose, UA: NEGATIVE mg/dL
Hgb urine dipstick: NEGATIVE
Ketones, ur: 20 mg/dL — AB
Leukocytes, UA: NEGATIVE
Nitrite: NEGATIVE
Protein, ur: NEGATIVE mg/dL
Specific Gravity, Urine: 1.034 — ABNORMAL HIGH (ref 1.005–1.030)
pH: 6 (ref 5.0–8.0)

## 2018-12-07 LAB — TROPONIN I

## 2018-12-07 LAB — LACTIC ACID, PLASMA
Lactic Acid, Venous: 0.9 mmol/L (ref 0.5–1.9)
Lactic Acid, Venous: 0.8 mmol/L (ref 0.5–1.9)

## 2018-12-07 LAB — BRAIN NATRIURETIC PEPTIDE: B Natriuretic Peptide: 1047 pg/mL — ABNORMAL HIGH (ref 0.0–100.0)

## 2018-12-07 LAB — INFLUENZA PANEL BY PCR (TYPE A & B)
Influenza A By PCR: NEGATIVE
Influenza B By PCR: NEGATIVE

## 2018-12-07 MED ORDER — CARVEDILOL 25 MG PO TABS
25.0000 mg | ORAL_TABLET | Freq: Two times a day (BID) | ORAL | Status: DC
  Administered 2018-12-08 – 2018-12-10 (×5): 25 mg via ORAL
  Filled 2018-12-07 (×5): qty 1

## 2018-12-07 MED ORDER — ONDANSETRON HCL 4 MG PO TABS: 4.0000 mg | ORAL_TABLET | Freq: Four times a day (QID) | ORAL | Status: DC | PRN

## 2018-12-07 MED ORDER — SODIUM CHLORIDE 0.9 % IV SOLN
500.0000 mg | INTRAVENOUS | Status: DC
  Administered 2018-12-07 – 2018-12-09 (×3): 500 mg via INTRAVENOUS
  Filled 2018-12-07 (×4): qty 500

## 2018-12-07 MED ORDER — SODIUM CHLORIDE 0.9% FLUSH
3.0000 mL | INTRAVENOUS | Status: DC | PRN
  Administered 2018-12-08: 21:00:00 3 mL via INTRAVENOUS
  Filled 2018-12-07: qty 3

## 2018-12-07 MED ORDER — PANTOPRAZOLE SODIUM 40 MG PO TBEC
40.0000 mg | DELAYED_RELEASE_TABLET | Freq: Every day | ORAL | Status: DC
  Administered 2018-12-07 – 2018-12-10 (×4): 40 mg via ORAL
  Filled 2018-12-07 (×4): qty 1

## 2018-12-07 MED ORDER — CLONIDINE HCL 0.1 MG PO TABS
0.1000 mg | ORAL_TABLET | Freq: Two times a day (BID) | ORAL | Status: DC
  Administered 2018-12-07 – 2018-12-10 (×6): 0.1 mg via ORAL
  Filled 2018-12-07 (×6): qty 1

## 2018-12-07 MED ORDER — ENOXAPARIN SODIUM 40 MG/0.4ML ~~LOC~~ SOLN
40.0000 mg | SUBCUTANEOUS | Status: DC
  Administered 2018-12-07 – 2018-12-09 (×3): 40 mg via SUBCUTANEOUS
  Filled 2018-12-07 (×3): qty 0.4

## 2018-12-07 MED ORDER — TRAZODONE HCL 50 MG PO TABS
50.0000 mg | ORAL_TABLET | Freq: Every day | ORAL | Status: DC
  Administered 2018-12-07 – 2018-12-09 (×3): 50 mg via ORAL
  Filled 2018-12-07 (×3): qty 1

## 2018-12-07 MED ORDER — SODIUM CHLORIDE 0.9 % IV SOLN
1.0000 g | INTRAVENOUS | Status: DC
  Administered 2018-12-07 – 2018-12-09 (×3): 1 g via INTRAVENOUS
  Filled 2018-12-07: qty 1
  Filled 2018-12-07 (×3): qty 10
  Filled 2018-12-07: qty 1

## 2018-12-07 MED ORDER — CARVEDILOL 25 MG PO TABS
25.0000 mg | ORAL_TABLET | Freq: Two times a day (BID) | ORAL | Status: DC
  Administered 2018-12-07: 25 mg via ORAL
  Filled 2018-12-07: qty 1

## 2018-12-07 MED ORDER — CALCIUM CARBONATE-VITAMIN D 500-200 MG-UNIT PO TABS
1.0000 | ORAL_TABLET | Freq: Every day | ORAL | Status: DC
  Administered 2018-12-08 – 2018-12-10 (×3): 1 via ORAL
  Filled 2018-12-07 (×3): qty 1

## 2018-12-07 MED ORDER — RALOXIFENE HCL 60 MG PO TABS
60.0000 mg | ORAL_TABLET | Freq: Every day | ORAL | Status: DC
  Administered 2018-12-08 – 2018-12-10 (×3): 60 mg via ORAL
  Filled 2018-12-07 (×4): qty 1

## 2018-12-07 MED ORDER — ONDANSETRON HCL 4 MG/2ML IJ SOLN: 4.0000 mg | Freq: Four times a day (QID) | INTRAMUSCULAR | Status: DC | PRN

## 2018-12-07 MED ORDER — FUROSEMIDE 40 MG PO TABS
40.0000 mg | ORAL_TABLET | Freq: Every day | ORAL | Status: DC
  Administered 2018-12-08 – 2018-12-10 (×3): 40 mg via ORAL
  Filled 2018-12-07 (×3): qty 1

## 2018-12-07 MED ORDER — IOHEXOL 350 MG/ML SOLN
75.0000 mL | Freq: Once | INTRAVENOUS | Status: AC | PRN
  Administered 2018-12-07: 75 mL via INTRAVENOUS

## 2018-12-07 MED ORDER — SENNOSIDES-DOCUSATE SODIUM 8.6-50 MG PO TABS
1.0000 | ORAL_TABLET | Freq: Every evening | ORAL | Status: DC | PRN
  Administered 2018-12-09: 22:00:00 1 via ORAL
  Filled 2018-12-07: qty 1

## 2018-12-07 MED ORDER — ACETAMINOPHEN 650 MG RE SUPP: 650.0000 mg | Freq: Four times a day (QID) | RECTAL | Status: DC | PRN

## 2018-12-07 MED ORDER — SODIUM CHLORIDE 0.9 % IV SOLN: 250.0000 mL | INTRAVENOUS | Status: DC | PRN

## 2018-12-07 MED ORDER — HYDRALAZINE HCL 20 MG/ML IJ SOLN
10.0000 mg | Freq: Once | INTRAMUSCULAR | Status: AC
  Administered 2018-12-07: 10 mg via INTRAVENOUS
  Filled 2018-12-07: qty 1

## 2018-12-07 MED ORDER — RAMIPRIL 10 MG PO CAPS
20.0000 mg | ORAL_CAPSULE | ORAL | Status: DC
  Administered 2018-12-08 – 2018-12-10 (×3): 20 mg via ORAL
  Filled 2018-12-07 (×3): qty 2

## 2018-12-07 MED ORDER — SODIUM CHLORIDE 0.9% FLUSH
3.0000 mL | Freq: Two times a day (BID) | INTRAVENOUS | Status: DC
  Administered 2018-12-07 – 2018-12-09 (×5): 3 mL via INTRAVENOUS

## 2018-12-07 MED ORDER — IPRATROPIUM-ALBUTEROL 0.5-2.5 (3) MG/3ML IN SOLN
3.0000 mL | Freq: Once | RESPIRATORY_TRACT | Status: AC
  Administered 2018-12-07: 3 mL via RESPIRATORY_TRACT
  Filled 2018-12-07: qty 3

## 2018-12-07 MED ORDER — IPRATROPIUM-ALBUTEROL 0.5-2.5 (3) MG/3ML IN SOLN
3.0000 mL | Freq: Four times a day (QID) | RESPIRATORY_TRACT | Status: DC
  Administered 2018-12-07 – 2018-12-10 (×12): 3 mL via RESPIRATORY_TRACT
  Filled 2018-12-07 (×12): qty 3

## 2018-12-07 MED ORDER — VITAMIN C 500 MG PO TABS
500.0000 mg | ORAL_TABLET | Freq: Every day | ORAL | Status: DC
  Administered 2018-12-08 – 2018-12-10 (×3): 500 mg via ORAL
  Filled 2018-12-07 (×3): qty 1

## 2018-12-07 MED ORDER — SODIUM CHLORIDE 0.9 % IV BOLUS
1000.0000 mL | Freq: Once | INTRAVENOUS | Status: AC
  Administered 2018-12-07: 1000 mL via INTRAVENOUS

## 2018-12-07 MED ORDER — RAMIPRIL 10 MG PO CAPS
10.0000 mg | ORAL_CAPSULE | Freq: Every day | ORAL | Status: DC
  Administered 2018-12-07: 10 mg via ORAL
  Filled 2018-12-07: qty 1

## 2018-12-07 MED ORDER — POTASSIUM CHLORIDE CRYS ER 20 MEQ PO TBCR
20.0000 meq | EXTENDED_RELEASE_TABLET | Freq: Every day | ORAL | Status: DC
  Administered 2018-12-07 – 2018-12-10 (×4): 20 meq via ORAL
  Filled 2018-12-07: qty 2
  Filled 2018-12-07 (×3): qty 1

## 2018-12-07 MED ORDER — ACETAMINOPHEN 325 MG PO TABS
650.0000 mg | ORAL_TABLET | Freq: Four times a day (QID) | ORAL | Status: DC | PRN
  Administered 2018-12-09: 650 mg via ORAL
  Filled 2018-12-07: qty 2

## 2018-12-07 MED ORDER — FUROSEMIDE 10 MG/ML IJ SOLN: 40.0000 mg | Freq: Every day | INTRAMUSCULAR | Status: DC

## 2018-12-07 MED ORDER — METHYLPREDNISOLONE SODIUM SUCC 125 MG IJ SOLR
60.0000 mg | Freq: Four times a day (QID) | INTRAMUSCULAR | Status: DC
  Administered 2018-12-07 – 2018-12-08 (×5): 60 mg via INTRAVENOUS
  Filled 2018-12-07 (×5): qty 2

## 2018-12-07 MED ORDER — ESCITALOPRAM OXALATE 10 MG PO TABS
10.0000 mg | ORAL_TABLET | Freq: Every day | ORAL | Status: DC
  Administered 2018-12-07 – 2018-12-10 (×4): 10 mg via ORAL
  Filled 2018-12-07 (×4): qty 1

## 2018-12-07 MED ORDER — CLONIDINE HCL 0.1 MG PO TABS
0.1000 mg | ORAL_TABLET | Freq: Once | ORAL | Status: AC
  Administered 2018-12-07: 0.1 mg via ORAL
  Filled 2018-12-07: qty 1

## 2018-12-07 NOTE — H&P (Signed)
[facility] NAME: Melanie Huffman    MR#:  [id number]  DATE OF BIRTH:  1928-07-13  DATE OF ADMISSION:  12/07/2018  PRIMARY CARE PHYSICIAN: Dion Body, MD   REQUESTING/REFERRING PHYSICIAN:   CHIEF COMPLAINT:   Chief Complaint  Patient presents with  . Shortness of Breath  . Cough    HISTORY OF PRESENT ILLNESS: Melanie Huffman  is a 85 y.o. female with a known history of breast cancer, COPD on home oxygen, dementia, GERD, hypertension, peripheral vascular disease presented to the emergency room for shortness of breath for the last couple of days.  She also has some wheezing.  Oxygen saturation was low patient was put on BiPAP and stabilized in the emergency room.  He was given nebulization treatments.  Patient is DNR by [place].  She was worked up with CTA chest which showed no pulmonary embolism but evidence of pulmonary hypertension noted.  Pleural effusions noted.  Hospitalist service was consulted for further care.  PAST MEDICAL HISTORY:   Past Medical History:  Diagnosis Date  . Anxiety   . Arthritis 2019   mostly in neck  . Asthma   . Breast cancer ([place]) 1991   mastectomy left  . Cancer ([place]) 1991   L Breast  . CHF (congestive heart failure) ([place])   . COPD (chronic obstructive pulmonary disease) ([place])    hypoxia with resp failure admission in 03/2018  . Dementia ([place])    confusion beginning  . Emphysema of lung ([place])   . GERD (gastroesophageal reflux disease)   . Heart murmur   . History of hiatal hernia   . Hypertension 03/2018   recent hypertension urgency ..admitted in april 2019  . Hypoxia 03/2018   O2 saturation runs 86-91% on Room Air.  . Intermittent dysphagia   . Peripheral vascular disease ([place])   . Shortness of breath dyspnea   . Traumatic subdural hematoma ([facility])     PAST SURGICAL HISTORY:  Past Surgical History:  Procedure Laterality Date  . ABDOMINAL HYSTERECTOMY     Partial  .  APPENDECTOMY    . BREAST BIOPSY Right    1980 and 1970's  . BREAST SURGERY  1991   L Mastectomy  . EYE SURGERY Bilateral    cataract extraction  . MASTECTOMY    . ORIF PATELLA Left 04/12/2015   Procedure: OPEN REDUCTION INTERNAL (ORIF) FIXATION PATELLA;  Surgeon: Hessie Knows, MD;  Location: [facility] ORS;  Service: Orthopedics;  Laterality: Left;  . ORIF WRIST FRACTURE Right 05/04/2018   Procedure: OPEN REDUCTION INTERNAL FIXATION (ORIF) WRIST FRACTURE;  Surgeon: Hessie Knows, MD;  Location: [facility] ORS;  Service: Orthopedics;  Laterality: Right;  . PERIPHERAL VASCULAR CATHETERIZATION Left 04/17/2015   Procedure: Lower Extremity Angiography;  Surgeon: Katha Cabal, MD;  Location: [place] [facility];  Service: Cardiovascular;  Laterality: Left;  . PERIPHERAL VASCULAR CATHETERIZATION  04/17/2015   Procedure: Lower Extremity Intervention;  Surgeon: Katha Cabal, MD;  Location: [place] [facility];  Service: Cardiovascular;;  . TONSILLECTOMY      SOCIAL HISTORY:  Social History   Tobacco Use  . Smoking status: Former Smoker    Packs/day: 0.50    Years: 20.00    Pack years: 10.00    Types: Cigarettes    Last attempt to quit: 01/26/1979    Years since quitting: 39.8  . Smokeless tobacco: Never Used  Substance Use Topics  . Alcohol use: No  FAMILY HISTORY:  Family History  Problem Relation Age of Onset  . CVA Mother   . Kidney disease Mother   . Heart disease Father   . Kidney disease Cousin 53  . Breast cancer Cousin   . Kidney disease Cousin 83  . Breast cancer Cousin     DRUG ALLERGIES:  Allergies  Allergen Reactions  . Amlodipine Swelling     [Onset: 08/26/2013]feet and neck begin to swell  . Adhesive [Tape] Other (See Comments)    Skin is very thin and tears easily. Please use paper tape only  . Codeine Nausea Only  . Percocet [Oxycodone-Acetaminophen] Nausea And Vomiting    Patient took on an empty stomach. Good relief but unsettled. Still has not eaten today   . Sulfa Antibiotics Nausea Only and Rash    rash    REVIEW OF SYSTEMS:   CONSTITUTIONAL: No fever, fatigue or weakness.  EYES: No blurred or double vision.  EARS, NOSE, AND THROAT: No tinnitus or ear pain.  RESPIRATORY: Has cough, shortness of breath, wheezing  no hemoptysis.  CARDIOVASCULAR: No chest pain, orthopnea, edema.  GASTROINTESTINAL: No nausea, vomiting, diarrhea or abdominal pain.  GENITOURINARY: No dysuria, hematuria.  ENDOCRINE: No polyuria, nocturia,  HEMATOLOGY: No anemia, easy bruising or bleeding SKIN: No rash or lesion. MUSCULOSKELETAL: No joint pain or arthritis.   NEUROLOGIC: No tingling, numbness, weakness.  PSYCHIATRY: No anxiety or depression.   MEDICATIONS AT HOME:  Prior to Admission medications   Medication Sig Start Date End Date Taking? Authorizing Provider  acetaminophen (TYLENOL) 325 MG tablet Take 2 tablets (650 mg total) by mouth every 6 (six) hours as needed for mild pain (or Fever >/= 101). 06/09/18   Loletha Grayer, MD  albuterol (PROVENTIL HFA;VENTOLIN HFA) 108 (90 Base) MCG/ACT inhaler Inhale 2 puffs into the lungs every 6 (six) hours as needed for wheezing or shortness of breath.    [provider]  Calcium Carbonate-Vitamin D3 (CALCIUM 600/VITAMIN D) 600-400 MG-UNIT TABS Take 1 tablet by mouth daily.    [provider]  carvedilol (COREG) 25 MG tablet Take 25 mg by mouth 2 (two) times daily with a meal.    [provider]  cloNIDine (CATAPRES) 0.1 MG tablet Take 0.1 mg by mouth 2 (two) times daily. 07/20/18   [provider]  escitalopram (LEXAPRO) 10 MG tablet Take 10 mg by mouth daily.    [provider]  fluticasone furoate-vilanterol (BREO ELLIPTA) 100-25 MCG/INH AEPB Inhale 1 puff into the lungs daily.    [provider]  furosemide (LASIX) 40 MG tablet Take 40 mg by mouth 2 (two) times daily. And additional 20mg  PRN    [provider]  hydrALAZINE (APRESOLINE) 25 MG tablet  Take 1 tablet (25 mg total) by mouth every 8 (eight) hours. Patient not taking: Reported on 09/03/2018 06/09/18   Loletha Grayer, MD  mometasone (ELOCON) 0.1 % ointment Apply 1 application topically as needed. 08/26/18   [provider]  Multiple Vitamins-Minerals (CENTRUM SILVER PO) Take 1 tablet by mouth daily.    [provider]  omeprazole (PRILOSEC) 20 MG capsule Take 20 mg by mouth daily.     [provider]  potassium chloride SA (K-DUR,KLOR-CON) 20 MEQ tablet Take 1 tablet (20 mEq total) by mouth daily. 03/09/18   Saundra Shelling, MD  predniSONE (STERAPRED UNI-PAK 21 TAB) 10 MG (21) TBPK tablet Per packaging instructions 09/10/18   Arta Silence, MD  Psyllium (METAMUCIL FIBER PO) Take 5 capsules by mouth  3 (three) times daily as needed.     [provider]  raloxifene (EVISTA) 60 MG tablet Take 60 mg by mouth daily.    [provider]  ramipril (ALTACE) 10 MG capsule Take 20 mg by mouth every morning.    [provider]  tiotropium (SPIRIVA) 18 MCG inhalation capsule Place 18 mcg into inhaler and inhale daily.     [provider]  vitamin C (ASCORBIC ACID) 500 MG tablet Take 500 mg by mouth daily.    [provider]      PHYSICAL EXAMINATION:   VITAL SIGNS: Blood pressure (!) 153/85, pulse 78, temperature 97.6 F (36.4 C), temperature source Oral, resp. rate (!) 25, height 5\' 5"  (1.651 m), weight 50.8 kg, SpO2 97 %.  GENERAL:  85 y.o.-year-old patient lying in the bed with no acute distress.  EYES: Pupils equal, round, reactive to light and accommodation. No scleral icterus. Extraocular muscles intact.  HEENT: Head atraumatic, normocephalic. Oropharynx and nasopharynx clear.  NECK:  Supple, no jugular venous distention. No thyroid enlargement, no tenderness.  LUNGS: Decreased breath sounds bilaterally, bilateral wheezing On BiPAP IPAP 12 EPAP 6 Rate 8 FiO2 40% CARDIOVASCULAR: S1, S2 normal. No murmurs,  rubs, or gallops.  ABDOMEN: Soft, nontender, nondistended. Bowel sounds present. No organomegaly or mass.  EXTREMITIES: No pedal edema, cyanosis, or clubbing.  NEUROLOGIC: Cranial nerves II through XII are intact. Muscle strength 5/5 in all extremities. Sensation intact. Gait not checked.  PSYCHIATRIC: The patient is alert and oriented x 2.  SKIN: No obvious rash, lesion, or ulcer.   LABORATORY PANEL:   CBC Recent Labs  Lab 12/07/18 0852  WBC 3.7*  HGB 13.6  HCT 42.6  PLT 95*  MCV 93.8  MCH 30.0  MCHC 31.9  RDW 13.5   ------------------------------------------------------------------------------------------------------------------  Chemistries  Recent Labs  Lab 12/07/18 0852  NA 130*  K 4.5  CL 93*  CO2 31  GLUCOSE 154*  BUN 10  CREATININE 0.61  CALCIUM 8.4*   ------------------------------------------------------------------------------------------------------------------ estimated creatinine clearance is 39 mL/min (by C-G formula based on SCr of 0.61 mg/dL). ------------------------------------------------------------------------------------------------------------------ No results for input(s): TSH, T4TOTAL, T3FREE, THYROIDAB in the last 72 hours.  Invalid input(s): FREET3   Coagulation profile No results for input(s): INR, PROTIME in the last 168 hours. ------------------------------------------------------------------------------------------------------------------- No results for input(s): DDIMER in the last 72 hours. -------------------------------------------------------------------------------------------------------------------  Cardiac Enzymes Recent Labs  Lab 12/07/18 0852  TROPONINI <0.03   ------------------------------------------------------------------------------------------------------------------ Invalid input(s):  POCBNP  ---------------------------------------------------------------------------------------------------------------  Urinalysis    Component Value Date/Time   COLORURINE YELLOW (A) 06/04/2018 1659   APPEARANCEUR CLEAR (A) 06/04/2018 1659   APPEARANCEUR Clear 07/26/2014 0034   LABSPEC 1.016 06/04/2018 1659   LABSPEC 1.010 07/26/2014 0034   PHURINE 5.0 06/04/2018 1659   GLUCOSEU NEGATIVE 06/04/2018 1659   GLUCOSEU Negative 07/26/2014 0034   HGBUR NEGATIVE 06/04/2018 1659   BILIRUBINUR NEGATIVE 06/04/2018 1659   BILIRUBINUR Negative 07/26/2014 0034   KETONESUR NEGATIVE 06/04/2018 1659   PROTEINUR 30 (A) 06/04/2018 1659   NITRITE NEGATIVE 06/04/2018 1659   LEUKOCYTESUR NEGATIVE 06/04/2018 1659   LEUKOCYTESUR Trace 07/26/2014 0034     RADIOLOGY: Dg Chest 2 View  Result Date: 12/07/2018 CLINICAL DATA:  Shortness of breath and productive cough EXAM: CHEST - 2 VIEW COMPARISON:  09/10/2018 FINDINGS: Cardiac shadow remains enlarged. Aortic calcifications are again seen. Diffuse interstitial changes are again identified throughout both lungs chronic in nature. The lungs remain hyperexpanded with posterior effusions somewhat improved from the prior exam. No  focal infiltrate or sizable effusion is seen. No acute bony abnormality is noted. IMPRESSION: Chronic changes without acute abnormality. Electronically Signed   By: Inez Catalina M.D.   On: 12/07/2018 09:25   Ct Angio Chest Pe W And/or Wo Contrast  Result Date: 12/07/2018 CLINICAL DATA:  85 year old female with worsening shortness of breath and productive cough. Chronically on oxygen. Subsequent encounter. EXAM: CT ANGIOGRAPHY CHEST WITH CONTRAST TECHNIQUE: Multidetector CT imaging of the chest was performed using the standard protocol during bolus administration of intravenous contrast. Multiplanar CT image reconstructions and MIPs were obtained to evaluate the vascular anatomy. CONTRAST:  41mL OMNIPAQUE IOHEXOL 350 MG/ML SOLN COMPARISON:   12/07/2018 chest x-ray.  02/09/2017 CT chest. FINDINGS: Cardiovascular: Respiratory motion degradation slightly limits evaluation of lower lobe pulmonary arteries. Taking this limitation into account, no pulmonary embolus is noted. Main pulmonary artery is enlarged measuring up to 3.5 cm consistent with component of pulmonary hypertension. Cardiomegaly. Coronary artery calcifications. Small amount of pericardial fluid greatest superiorly. Atherosclerotic changes thoracic aorta without aneurysm or dissection. Mediastinum/Nodes: Matted mediastinal and hilar lymph nodes. Partially fluid and gas-filled esophagus may be related to dysmotility. Lungs/Pleura: Severe emphysema. Superimposed pulmonary vascular prominence. Bilateral pleural effusions greater on left with basilar atelectasis. Additionally, interval progression of peribronchial thickening most notable lower lobes and greater on the right contributing to narrowing of the bronchi and basilar atelectasis. Findings consistent with progressive bronchitis. Can not exclude subtle right lower lobe infiltrate. Upper Abdomen: Thoracic kyphosis with degenerative changes without focal compression fracture. Motion artifact through upper sternum limiting evaluation at this level. Musculoskeletal: No worrisome upper abdominal abnormality noted. Review of the MIP images confirms the above findings. IMPRESSION: 1. Respiratory motion degradation slightly limits evaluation of lower lobe pulmonary arteries. Taking this limitation into account, no pulmonary embolus is noted. Main pulmonary artery is enlarged measuring up to 3.5 cm consistent with component of pulmonary hypertension. 2. Severe emphysema. Superimposed pulmonary vascular prominence. Bilateral pleural effusions greater on left with basilar atelectasis. Additionally, interval progression of peribronchial thickening most notable lower lobes and greater on the right contributing to narrowing of the bronchi and basilar  atelectasis. Findings consistent with progressive bronchitis. Cannot entirely exclude subtle right lower lobe infiltrate. 3. Cardiomegaly.  Coronary artery calcifications. 4. Atherosclerotic changes thoracic aorta. Aortic Atherosclerosis (ICD10-I70.0).  Emphysema (ICD10-J43.9). Electronically Signed   By: Genia Del M.D.   On: 12/07/2018 10:14    EKG: Orders placed or performed during the hospital encounter of 12/07/18  . ED EKG within 10 minutes  . ED EKG within 10 minutes  . EKG 12-Lead  . EKG 12-Lead    IMPRESSION AND PLAN:  85 year old elderly female patient with history of COPD, chronic respiratory failure, hypertension, dementia GERD, peripheral vascular disease presented to the emergency room for shortness of breath  -Acute on chronic respiratory failure with hypoxia Continue BiPAP for respiratory distress Nebulization treatments Intensivist consultation  -Acute COPD exacerbation IV Solu-Medrol 60 MDQ 6 hourly Aggressive nebulization therapy IV Rocephin and Zithromax antibiotics  -Pleural effusions Continue Lasix for diuresis  -Hyponatremia Monitor sodium level  -DVT prophylaxis with subcu Lovenox daily  -Dementia supportive care  All the records are reviewed and case discussed with ED provider. Management plans discussed with the patient, family and they are in agreement.  CODE STATUS:DNR Code Status History    Date Active Date Inactive Code Status Order ID Comments User Context   07/25/2018 2004 07/27/2018 1853 DNR [id number]  Demetrios Loll, MD Inpatient   06/04/2018 [phone] 06/09/2018 1920  Full Code [id number]  Max Sane, MD Inpatient   06/04/2018 1832 06/04/2018 1959 DNR [id number]  Max Sane, MD ED   03/08/2018 0008 03/09/2018 1733 Full Code [id number]  Lance Coon, MD Inpatient   10/31/2016 0237 11/04/2016 2008 Full Code [id number]  Lance Coon, MD Inpatient   07/09/2016 1558 07/10/2016 1914 Full Code [id number]  Henreitta Leber, MD Inpatient   04/12/2015 1949 04/16/2015 1818  Full Code [id number]  Hessie Knows, MD Inpatient   04/11/2015 1252 04/12/2015 1949 Full Code [id number]  Hessie Knows, MD Inpatient   07/26/2014 0445 07/26/2014 1934 Full Code [id number]  Ashok Pall, MD Inpatient    Questions for Most Recent Historical Code Status (Order [id number])    Question Answer Comment   In the event of cardiac or respiratory ARREST Do not call a "code blue"    In the event of cardiac or respiratory ARREST Do not perform Intubation, CPR, defibrillation or ACLS    In the event of cardiac or respiratory ARREST Use medication by any route, position, wound care, and other measures to relive pain and suffering. May use oxygen, suction and manual treatment of airway obstruction as needed for comfort.         Advance Directive Documentation     Most Recent Value  Type of Advance Directive  Healthcare Power of Attorney, Living will  Pre-existing out of facility DNR order (yellow form or pink MOST form)  -  "MOST" Form in Place?  -       TOTAL TIME TAKING CARE OF THIS PATIENT: 53 minutes.    Saundra Shelling M.D on 12/07/2018 at 11:14 AM  Between 7am to 6pm - Pager - [phone]  After 6pm go to [email or web address] - password [email or web address] [facility]  Office  [phone]  CC: Primary care physician; Dion Body, MD

## 2018-12-07 NOTE — Progress Notes (Signed)
Advanced care plan.  Purpose of the Encounter: CODE STATUS  Parties in Attendance: Patient and family  Patient's Decision Capacity: Okay  Subjective/Patient's story: Presented to emergency room for shortness of breath and wheezing   Objective/Medical story Patient was short of breath oxygen saturation was low Needs BiPAP for respiratory distress Needs IV Solu-Medrol and nebulization treatments   Goals of care determination:  Advance care directives goals of care and treatment plan discussed Patient and family do not want CPR, intubation and ventilator if the need arises   CODE STATUS: DNR   Time spent discussing advanced care planning: 16 minutes

## 2018-12-07 NOTE — Patient Outreach (Signed)
[facility]) Care Management  12/07/2018  Melanie Huffman 1928-07-01 [id number]    RN Health Coach scheduled to call the patient to day for initial assessment.  Notified that the patient is being seen in the ER today for hyponatremia.  Plan:  RN Health Coach will monitor the patient's situation  and cancel the call for today.  [name] RN, BSN, [place] Direct Dial:  [phone]  Fax: [phone]

## 2018-12-07 NOTE — ED Triage Notes (Addendum)
Patient from home via [facility]. Patient reports worsening shortness of breath and productive cough since last night. Patient reports green sputum production. Patient on 3L O2 chronically. Per EMS, patient was 74% on 3L upon their arrival. Patient on 4L upon arrival to ED with saturation of 97%. History of CHF and COPD  2 in nitro paste to left chest 125 mg solumedrol 1 duoneb 1 albuterol

## 2018-12-07 NOTE — ED Provider Notes (Signed)
[facility] Emergency Department Provider Note  ____________________________________________  Time seen: Approximately 9:28 AM  I have reviewed the triage vital signs and the nursing notes.   HISTORY  Chief Complaint Shortness of Breath and Cough    HPI Melanie Huffman is a 85 y.o. female history of COPD on 3 L nasal cannula, CHF, dementia but continues to live at home independently, remote breast cancer, resenting with cough and shortness of breath.  The patient reports that for the past several days she has had cough productive of green sputum with associated shortness of breath.  She has not had any fevers or chills, sore throat, ear pain, chest pain, palpitations, lightheadedness or syncope.  EMS reports that her O2 sats were 79% on her 3 L nasal cannula and improved to 95% on 4 L nasal cannula.  In route, she was given a DuoNeb and an albuterol treatment, Solu-Medrol, and 2 inches of nitroglycerin paste for significant hypertension.  The patient reports she has not taken any of her medications, including her hyper antihypertensives this morning.  No lower extremity swelling, calf pain.  Past Medical History:  Diagnosis Date  . Anxiety   . Arthritis 2019   mostly in neck  . Asthma   . Breast cancer ([place]) 1991   mastectomy left  . Cancer ([place]) 1991   L Breast  . CHF (congestive heart failure) ([place])   . COPD (chronic obstructive pulmonary disease) ([place])    hypoxia with resp failure admission in 03/2018  . Dementia ([place])    confusion beginning  . Emphysema of lung ([place])   . GERD (gastroesophageal reflux disease)   . Heart murmur   . History of hiatal hernia   . Hypertension 03/2018   recent hypertension urgency ..admitted in april 2019  . Hypoxia 03/2018   O2 saturation runs 86-91% on Room Air.  . Intermittent dysphagia   . Peripheral vascular disease ([place])   . Shortness of breath dyspnea   . Traumatic subdural hematoma [facility])     Patient Active  Problem List   Diagnosis Date Noted  . Chronic systolic heart failure ([place]) 08/06/2018  . Syncope 06/04/2018  . Accelerated hypertension 03/07/2018  . Acute systolic CHF (congestive heart failure) ([place]) 03/07/2018  . Mass of upper lobe of left lung 11/09/2016  . HTN (hypertension) 10/31/2016  . PVD (peripheral vascular disease) ([place]) 10/31/2016  . COPD (chronic obstructive pulmonary disease) ([place]) 10/31/2016  . GERD (gastroesophageal reflux disease) 10/31/2016  . Hyponatremia 07/10/2016  . Hypokalemia 07/10/2016  . Cellulitis of leg, left 07/09/2016  . Subdural hematoma ([place]) 07/26/2014    Past Surgical History:  Procedure Laterality Date  . ABDOMINAL HYSTERECTOMY     Partial  . APPENDECTOMY    . BREAST BIOPSY Right    1980 and 1970's  . BREAST SURGERY  1991   L Mastectomy  . EYE SURGERY Bilateral    cataract extraction  . MASTECTOMY    . ORIF PATELLA Left 04/12/2015   Procedure: OPEN REDUCTION INTERNAL (ORIF) FIXATION PATELLA;  Surgeon: Hessie Knows, MD;  Location: [facility] ORS;  Service: Orthopedics;  Laterality: Left;  . ORIF WRIST FRACTURE Right 05/04/2018   Procedure: OPEN REDUCTION INTERNAL FIXATION (ORIF) WRIST FRACTURE;  Surgeon: Hessie Knows, MD;  Location: [facility] ORS;  Service: Orthopedics;  Laterality: Right;  . PERIPHERAL VASCULAR CATHETERIZATION Left 04/17/2015   Procedure: Lower Extremity Angiography;  Surgeon: Katha Cabal, MD;  Location: [place] [facility];  Service: Cardiovascular;  Laterality: Left;  .  PERIPHERAL VASCULAR CATHETERIZATION  04/17/2015   Procedure: Lower Extremity Intervention;  Surgeon: Katha Cabal, MD;  Location: [place] [facility];  Service: Cardiovascular;;  . TONSILLECTOMY      Current Outpatient Rx  . Order #: [id number] Class: No Print  . Order #: [id number] Class: Historical Med  . Order #: [id number] Class: Historical Med  . Order #: [id number] Class: Historical Med  . Order #: [id number] Class: Historical Med  . Order #:  [id number] Class: Historical Med  . Order #: [id number] Class: Historical Med  . Order #: [id number] Class: Historical Med  . Order #: [id number] Class: Normal  . Order #: [id number] Class: Historical Med  . Order #: [id number] Class: Historical Med  . Order #: [id number] Class: Historical Med  . Order #: [id number] Class: Normal  . Order #: [id number] Class: Print  . Order #: [id number] Class: Historical Med  . Order #: [id number] Class: Historical Med  . Order #: [id number] Class: Historical Med  . Order #: [id number] Class: Historical Med  . Order #: [id number] Class: Historical Med    Allergies Amlodipine; Adhesive [tape]; Codeine; Percocet [oxycodone-acetaminophen]; and Sulfa antibiotics  Family History  Problem Relation Age of Onset  . CVA Mother   . Kidney disease Mother   . Heart disease Father   . Kidney disease Cousin 30  . Breast cancer Cousin   . Kidney disease Cousin 48  . Breast cancer Cousin     Social History Social History   Tobacco Use  . Smoking status: Former Smoker    Packs/day: 0.50    Years: 20.00    Pack years: 10.00    Types: Cigarettes    Last attempt to quit: 01/26/1979    Years since quitting: 39.8  . Smokeless tobacco: Never Used  Substance Use Topics  . Alcohol use: No  . Drug use: No    Review of Systems Constitutional: No fever/chills.  No lightheadedness or syncope. Eyes: No visual changes. ENT: No sore throat. No congestion or rhinorrhea. Cardiovascular: Denies chest pain. Denies palpitations. Respiratory: Positive hypoxia with shortness of breath.  Positive productive cough. Gastrointestinal: No abdominal pain.  No nausea, no vomiting.  No diarrhea.  No constipation. Genitourinary: Negative for dysuria. Musculoskeletal: Negative for back pain.  No lower extremity swelling or calf pain. Skin: Negative for rash. Neurological: Negative for headaches. No focal numbness, tingling or weakness.     ____________________________________________   PHYSICAL  EXAM:  VITAL SIGNS: ED Triage Vitals  Enc Vitals Group     BP 12/07/18 0840 (!) 228/110     Pulse Rate 12/07/18 0840 78     Resp 12/07/18 0840 (!) 25     Temp 12/07/18 0840 97.6 F (36.4 C)     Temp Source 12/07/18 0840 Oral     SpO2 12/07/18 0840 97 %     Weight 12/07/18 0842 112 lb (50.8 kg)     Height 12/07/18 0842 5\' 5"  (1.651 m)     Head Circumference --      Peak Flow --      Pain Score 12/07/18 0841 3     Pain Loc --      Pain Edu? --      Excl. in [place]? --     Constitutional: Alert and oriented. Answers questions appropriately.  Chronically ill-appearing.  When I went into the room, the patient was sleeping and her O2 sats were 79% on 5 L nasal cannula.  I switched her to a full facemask, and her O2 sats came up to 97%. Eyes: Conjunctivae are normal.  EOMI. No scleral  icterus.  No eye discharge. Head: Atraumatic. Nose: No congestion/rhinnorhea. Mouth/Throat: Mucous membranes are the dry.  The patient does have some cyanosis around the lips.  Neck: No stridor.  Supple.  As of JVD.  No meningismus. Cardiovascular: Normal rate, regular rhythm. No murmurs, rubs or gallops.  On arrival, the patient was hypertensive to 228/110; on my examination her blood pressure has significantly improved. Respiratory: The patient is tachypneic with accessory muscle use but no retractions.  She does have active coughing that is productive of thick sputum.  She has decreased breath sounds bilaterally with prolonged expiratory phase and poor airflow.  No wheezes rales or rhonchi. Gastrointestinal: Soft, nontender and nondistended.  No guarding or rebound.  No peritoneal signs. Musculoskeletal: No LE edema. No ttp in the calves or palpable cords.  Negative Homan's sign. Neurologic:  A&Ox3.  Speech is clear.  Face and smile are symmetric.  EOMI.  Moves all extremities well. Skin:  Skin is warm, dry and intact. No rash noted. Psychiatric: Mood and affect are normal. Speech and behavior are normal.   Normal judgement.  ____________________________________________   LABS (all labs ordered are listed, but only abnormal results are displayed)  Labs Reviewed  BASIC METABOLIC PANEL - Abnormal; Notable for the following components:      Result Value   Sodium 130 (*)    Chloride 93 (*)    Glucose, Bld 154 (*)    Calcium 8.4 (*)    All other components within normal limits  CBC - Abnormal; Notable for the following components:   WBC 3.7 (*)    Platelets 95 (*)    All other components within normal limits  BLOOD GAS, VENOUS - Abnormal; Notable for the following components:   pCO2, Ven 70 (*)    pO2, Ven 98.0 (*)    Bicarbonate 35.2 (*)    Acid-Base Excess 6.4 (*)    All other components within normal limits  CULTURE, BLOOD (ROUTINE X 2)  CULTURE, BLOOD (ROUTINE X 2)  TROPONIN I  BRAIN NATRIURETIC PEPTIDE  LACTIC ACID, PLASMA  LACTIC ACID, PLASMA  URINALYSIS, COMPLETE (UACMP) WITH MICROSCOPIC  INFLUENZA PANEL BY PCR (TYPE A & B)  BLOOD GAS, VENOUS   ____________________________________________  EKG  ED ECG REPORT I, Anne-Caroline Mariea Clonts, the attending physician, personally viewed and interpreted this ECG.   Date: 12/07/2018  EKG Time: 814  Rate: 79  Rhythm: normal sinus rhythm  Axis: normal  Intervals:none  ST&T Change: No STEMI  ____________________________________________  RADIOLOGY  Dg Chest 2 View  Result Date: 12/07/2018 CLINICAL DATA:  Shortness of breath and productive cough EXAM: CHEST - 2 VIEW COMPARISON:  09/10/2018 FINDINGS: Cardiac shadow remains enlarged. Aortic calcifications are again seen. Diffuse interstitial changes are again identified throughout both lungs chronic in nature. The lungs remain hyperexpanded with posterior effusions somewhat improved from the prior exam. No focal infiltrate or sizable effusion is seen. No acute bony abnormality is noted. IMPRESSION: Chronic changes without acute abnormality. Electronically Signed   By: Inez Catalina  M.D.   On: 12/07/2018 09:25   Ct Angio Chest Pe W And/or Wo Contrast  Result Date: 12/07/2018 CLINICAL DATA:  85 year old female with worsening shortness of breath and productive cough. Chronically on oxygen. Subsequent encounter. EXAM: CT ANGIOGRAPHY CHEST WITH CONTRAST TECHNIQUE: Multidetector CT imaging of the chest was performed using the standard protocol during bolus administration of intravenous contrast. Multiplanar CT image reconstructions and MIPs were obtained to evaluate the vascular anatomy. CONTRAST:  70mL OMNIPAQUE IOHEXOL  350 MG/ML SOLN COMPARISON:  12/07/2018 chest x-ray.  02/09/2017 CT chest. FINDINGS: Cardiovascular: Respiratory motion degradation slightly limits evaluation of lower lobe pulmonary arteries. Taking this limitation into account, no pulmonary embolus is noted. Main pulmonary artery is enlarged measuring up to 3.5 cm consistent with component of pulmonary hypertension. Cardiomegaly. Coronary artery calcifications. Small amount of pericardial fluid greatest superiorly. Atherosclerotic changes thoracic aorta without aneurysm or dissection. Mediastinum/Nodes: Matted mediastinal and hilar lymph nodes. Partially fluid and gas-filled esophagus may be related to dysmotility. Lungs/Pleura: Severe emphysema. Superimposed pulmonary vascular prominence. Bilateral pleural effusions greater on left with basilar atelectasis. Additionally, interval progression of peribronchial thickening most notable lower lobes and greater on the right contributing to narrowing of the bronchi and basilar atelectasis. Findings consistent with progressive bronchitis. Can not exclude subtle right lower lobe infiltrate. Upper Abdomen: Thoracic kyphosis with degenerative changes without focal compression fracture. Motion artifact through upper sternum limiting evaluation at this level. Musculoskeletal: No worrisome upper abdominal abnormality noted. Review of the MIP images confirms the above findings. IMPRESSION: 1.  Respiratory motion degradation slightly limits evaluation of lower lobe pulmonary arteries. Taking this limitation into account, no pulmonary embolus is noted. Main pulmonary artery is enlarged measuring up to 3.5 cm consistent with component of pulmonary hypertension. 2. Severe emphysema. Superimposed pulmonary vascular prominence. Bilateral pleural effusions greater on left with basilar atelectasis. Additionally, interval progression of peribronchial thickening most notable lower lobes and greater on the right contributing to narrowing of the bronchi and basilar atelectasis. Findings consistent with progressive bronchitis. Cannot entirely exclude subtle right lower lobe infiltrate. 3. Cardiomegaly.  Coronary artery calcifications. 4. Atherosclerotic changes thoracic aorta. Aortic Atherosclerosis (ICD10-I70.0).  Emphysema (ICD10-J43.9). Electronically Signed   By: Genia Del M.D.   On: 12/07/2018 10:14    ____________________________________________   PROCEDURES  Procedure(s) performed: None  Procedures  Critical Care performed: Yes, see critical care note(s) ____________________________________________   INITIAL IMPRESSION / ASSESSMENT AND PLAN / ED COURSE  Pertinent labs & imaging results that were available during my care of the patient were reviewed by me and considered in my medical decision making (see chart for details).  85 y.o. female with a history of COPD on 3 L nasal cannula at baseline presenting with several days of productive cough, now hypoxic to the 70s on her baseline oxygen.  Overall, the patient has symptoms that are concerning for COPD exacerbation and has been treated with Solu-Medrol, as well as albuterol and DuoNebs.  At this time, I will give her an additional DuoNeb.  She is currently on a facemask maintaining reassuring oxygen levels, but I have asked respiratory therapist to come place her on BiPAP.  A VBG and lactic acid are pending.  The patient will also undergo  CT examination to rule out PE although this is less likely than COPD exacerbation or possibly an infectious etiology.  The patient's chest x-ray does not show a focal pneumonia; antibiotics are being deferred at this time but we will reevaluate her once we have results.  Influenza testing is also pending.  I anticipate admission of this patient.  ----------------------------------------- 10:23 AM on 12/07/2018 ----------------------------------------- The patient CT scan does not show any evidence of infection or PE; it is consistent with her emphysema.  Her blood gas shows a normal pH but she is hypercarbic with a PCO2 of 70; her PO2 is reassuring at 98.  I will plan to repeat her VBG in 1.5 hours to see if her hypercarbia is responding and if she is  able to be weaned from BiPAP.  I have spoken to the hospitalist about admission.   CRITICAL CARE Performed by: Eula Listen   Total critical care time: 40 minutes  Critical care time was exclusive of separately billable procedures and treating other patients.  Critical care was necessary to treat or prevent imminent or life-threatening deterioration.  Critical care was time spent personally by me on the following activities: development of treatment plan with patient and/or surrogate as well as nursing, discussions with consultants, evaluation of patient's response to treatment, examination of patient, obtaining history from patient or surrogate, ordering and performing treatments and interventions, ordering and review of laboratory studies, ordering and review of radiographic studies, pulse oximetry and re-evaluation of patient's condition.   ____________________________________________  FINAL CLINICAL IMPRESSION(S) / ED DIAGNOSES  Final diagnoses:  Hyponatremia  Acute on chronic respiratory failure with hypoxia and hypercapnia (HCC)         NEW MEDICATIONS STARTED DURING THIS VISIT:  New Prescriptions   No medications on  file      Eula Listen, MD 12/07/18 1029

## 2018-12-07 NOTE — ED Notes (Signed)
Pt denies pain; blankets adjusted as requested by pt; pt given gingerale.

## 2018-12-07 NOTE — ED Notes (Signed)
Patient moved to hospital bed. Family at bedside with patient. Bipap mask removed and patient given water to drink. Replaced on BiPAP

## 2018-12-07 NOTE — ED Notes (Signed)
P resting with family at bedside. Remains on BiPAP.

## 2018-12-07 NOTE — ED Notes (Signed)
Pt requesting to use restroom; reminded a purewick is in place as pt states she only needs to urinate.

## 2018-12-07 NOTE — ED Notes (Signed)
Waiting on medication verification from pharmacy to give medications

## 2018-12-07 NOTE — ED Notes (Signed)
Pt given food/drink.

## 2018-12-07 NOTE — ED Notes (Signed)
Georgie RN, aware of bed assigned  

## 2018-12-08 DIAGNOSIS — L89521 Pressure ulcer of left ankle, stage 1: Secondary | ICD-10-CM

## 2018-12-08 LAB — BASIC METABOLIC PANEL
Anion gap: 4 — ABNORMAL LOW (ref 5–15)
BUN: 18 mg/dL (ref 8–23)
CO2: 31 mmol/L (ref 22–32)
Calcium: 8.5 mg/dL — ABNORMAL LOW (ref 8.9–10.3)
Chloride: 95 mmol/L — ABNORMAL LOW (ref 98–111)
Creatinine, Ser: 0.63 mg/dL (ref 0.44–1.00)
GFR calc Af Amer: >60 mL/min (ref >60)
GFR calc non Af Amer: >60 mL/min (ref >60)
Glucose, Bld: 141 mg/dL — ABNORMAL HIGH (ref 70–99)
Potassium: 4.8 mmol/L (ref 3.5–5.1)
Sodium: 130 mmol/L — ABNORMAL LOW (ref 135–145)

## 2018-12-08 LAB — CBC
HEMATOCRIT: 38.5 % (ref 36.0–46.0)
Hemoglobin: 12.3 g/dL (ref 12.0–15.0)
MCH: 29.9 pg (ref 26.0–34.0)
MCHC: 31.9 g/dL (ref 30.0–36.0)
MCV: 93.7 fL (ref 80.0–100.0)
Platelets: 105 10*3/uL — ABNORMAL LOW (ref 150–400)
RBC: 4.11 MIL/uL (ref 3.87–5.11)
RDW: 13.5 % (ref 11.5–15.5)
WBC: 4 10*3/uL (ref 4.0–10.5)
nRBC: 0 % (ref 0.0–0.2)

## 2018-12-08 MED ORDER — METHYLPREDNISOLONE SODIUM SUCC 40 MG IJ SOLR
40.0000 mg | Freq: Two times a day (BID) | INTRAMUSCULAR | Status: DC
  Administered 2018-12-09 – 2018-12-10 (×3): 40 mg via INTRAVENOUS
  Filled 2018-12-08 (×3): qty 1

## 2018-12-08 MED ORDER — MUPIROCIN CALCIUM 2 % EX CREA
TOPICAL_CREAM | Freq: Every day | CUTANEOUS | Status: DC
  Administered 2018-12-08 – 2018-12-10 (×3): via TOPICAL
  Filled 2018-12-08: qty 15

## 2018-12-08 NOTE — Progress Notes (Addendum)
[place] [facility] [place] NAME: Melanie Huffman    MR#:  [id number]  DATE OF BIRTH:  1928-03-04  SUBJECTIVE:  CHIEF COMPLAINT:   Chief Complaint  Patient presents with  . Shortness of Breath  . Cough    This morning patient reports some improvement in shortness of breath and cough.  No fevers.  Daughter present at bedside.  Updated on treatment plans as outlined below.  REVIEW OF SYSTEMS:  Review of Systems  Constitutional: Negative for chills and fever.  HENT: Negative for ear pain and hearing loss.   Eyes: Negative for blurred vision and double vision.  Respiratory: Positive for cough and shortness of breath.   Cardiovascular: Negative for chest pain and palpitations.  Gastrointestinal: Negative for heartburn and nausea.  Genitourinary: Negative for dysuria and urgency.  Musculoskeletal: Negative for myalgias and neck pain.  Neurological: Negative for dizziness.  Psychiatric/Behavioral: Negative for depression and hallucinations.    DRUG ALLERGIES:   Allergies  Allergen Reactions  . Amlodipine Swelling     [Onset: 08/26/2013]feet and neck begin to swell  . Adhesive [Tape] Other (See Comments)    Skin is very thin and tears easily. Please use paper tape only  . Codeine Nausea Only  . Percocet [Oxycodone-Acetaminophen] Nausea And Vomiting    Patient took on an empty stomach. Good relief but unsettled. Still has not eaten today  . Sulfa Antibiotics Nausea Only and Rash    rash   VITALS:  Blood pressure (!) 144/77, pulse 66, temperature (!) 97.5 F (36.4 C), temperature source Oral, resp. rate 20, height 5\' 5"  (1.651 m), weight 50.8 kg, SpO2 92 %. PHYSICAL EXAMINATION:   Physical Exam  Constitutional: She is oriented to person, place, and time and well-developed, well-nourished, and in no distress.  HENT:  Head: Normocephalic and atraumatic.  Eyes: Pupils are equal, round, and reactive to light. Conjunctivae and EOM are normal.    Neck: Normal range of motion. Neck supple.  Cardiovascular: Normal rate and regular rhythm.  Pulmonary/Chest: Effort normal and breath sounds normal.  Abdominal: Soft. Bowel sounds are normal.  Musculoskeletal: Normal range of motion.        General: No edema.     Comments: Patient has a stage I ulcer on the left ankle.  Neurological: She is alert and oriented to person, place, and time.  Skin: Skin is warm and dry. She is not diaphoretic.   LABORATORY PANEL:  Female CBC Recent Labs  Lab 12/08/18 0451  WBC 4.0  HGB 12.3  HCT 38.5  PLT 105*   ------------------------------------------------------------------------------------------------------------------ Chemistries  Recent Labs  Lab 12/08/18 0451  NA 130*  K 4.8  CL 95*  CO2 31  GLUCOSE 141*  BUN 18  CREATININE 0.63  CALCIUM 8.5*   RADIOLOGY:  No results found. ASSESSMENT AND PLAN:   85 year old elderly female patient with history of COPD, chronic respiratory failure, hypertension, dementia GERD, peripheral vascular disease presented to the emergency room for shortness of breath  1. Acute on chronic respiratory failure with hypoxia Patient already weaned off of BiPAP.  Currently on 3.5 L.  Patient uses 3 L of home oxygen therapy at home. Improving with management of COPD exacerbation as outlined below.  2.Acute COPD exacerbation IV Solu-Medrol 60 MDQ 6 hourly was initiated on admission.  Gradual tapering to 40 mg every 12 hourly to avoid steroid psychosis in this elderly patient.  Empiric antibiotics with azithromycin and Rocephin. Aggressive nebulization therapy Monitor clinically  3.-Pleural effusions Does not appear to be symptomatic Continue Lasix for diuresis  4. Hyponatremia Sodium level stable at 130 Monitor sodium level  5.  Dementia without behavioral disturbance. Stable  6.  Stage I ulcer on left ankle. Present on admission.  Follow-up on wound care nurse recommendations.  -DVT prophylaxis  with subcu Lovenox daily    All the records are reviewed and case discussed with Care Management/Social Worker. Management plans discussed with the patient, family; daughter and they are in agreement.  CODE STATUS: DNR  TOTAL TIME TAKING CARE OF THIS PATIENT: 36 minutes.   More than 50% of the time was spent in counseling/coordination of care: YES  POSSIBLE D/C IN 2 DAYS, DEPENDING ON CLINICAL CONDITION.   [name] M.D on 12/08/2018 at 2:07 PM  Between 7am to 6pm - Pager - [phone]  After 6pm go to [email or web address] - Proofreader  [facility]  Office  [phone]  CC: Primary care physician; Dion Body, MD  Note: This dictation was prepared with Dragon dictation along with smaller phrase technology. Any transcriptional errors that result from this process are unintentional.

## 2018-12-08 NOTE — Consult Note (Addendum)
[place] Nurse wound consult note Reason for Consult: Consult requested for left outer ankle.  Wound type: Pt has a chronic full thickness wound which has been there since "before Christmas" of unknown etiology.  She states her dermatologist assessed the wound appearance and "he had a long name for what type of wound it was that I don't remember, and he ordered some ointment." Measurement: .1X.1X.2cm Wound bed: red and dry; no odor, drainage, or fluctuance Periwound: intact skin surrounding Dressing procedure/placement/frequency: Bactroban ointment ordered to promote moist healing. Foam dressing to protect from further injury. Discussed plan of care with patient and daughter at the bedside. Please re-consult if further assistance is needed.  Thank-you,  Julien Girt MSN, [place], [place], [place], [place]

## 2018-12-08 NOTE — Plan of Care (Signed)
  Problem: Education: Goal: Knowledge of General Education information will improve Description Including pain rating scale, medication(s)/side effects and non-pharmacologic comfort measures Outcome: Progressing   Problem: Health Behavior/Discharge Planning: Goal: Ability to manage health-related needs will improve Outcome: Progressing   Problem: Clinical Measurements: Goal: Ability to maintain clinical measurements within normal limits will improve Outcome: Progressing Goal: Will remain free from infection Outcome: Progressing Goal: Diagnostic test results will improve Outcome: Progressing Goal: Respiratory complications will improve Outcome: Progressing Goal: Cardiovascular complication will be avoided Outcome: Progressing   Problem: Elimination: Goal: Will not experience complications related to bowel motility Outcome: Progressing Goal: Will not experience complications related to urinary retention Outcome: Progressing   Problem: Pain Managment: Goal: General experience of comfort will improve Outcome: Progressing   Problem: Safety: Goal: Ability to remain free from injury will improve Outcome: Progressing  Cont on 02 3.5 l

## 2018-12-09 MED ORDER — NON FORMULARY: 5.0000 mg | Freq: Every evening | Status: DC | PRN

## 2018-12-09 MED ORDER — DIPHENHYDRAMINE HCL 25 MG PO CAPS
25.0000 mg | ORAL_CAPSULE | Freq: Every evening | ORAL | Status: DC | PRN
  Administered 2018-12-09: 25 mg via ORAL
  Filled 2018-12-09: qty 1

## 2018-12-09 MED ORDER — MELATONIN 5 MG PO TABS
5.0000 mg | ORAL_TABLET | Freq: Every evening | ORAL | Status: DC | PRN
  Administered 2018-12-09: 22:00:00 5 mg via ORAL
  Filled 2018-12-09 (×2): qty 1

## 2018-12-09 NOTE — Evaluation (Signed)
Physical Therapy Evaluation Patient Details Name: Melanie Huffman MRN: [id number] DOB: [birth year] Today's Date: 12/09/2018   History of Present Illness  presented to ER secondary to progressive SOB (x2-3 days); admitted with acute respiratory failure with hypoxia secondary to COPD exacerbation, pleural effusion.  Initially requiring BiPAP, now weaned to 3.5L supplemental O2 via [place].  Clinical Impression  Upon evaluation, patient alert and oriented; follows commands and agreeable to participation with task.  Bilat UE/LE strength and ROM grossly symmetrical and WFL; no focal weakness, pain reported.  Able to complete bed mobility with mod indep; sit/stand, basic transfers and gait (80') with RW, cga/min assist.  Decreased cadence, decreased dynamic control and mild SOB with exertion; however, no overt buckling or LOB.  Do recommend continued use of RW for optimal safety/indep and overall energy conservation at discharge.  PAtient voiced understanding and agreement. Of note, mild desat to 88% on 3L with exertional activity; recovers to >90% within 30-60 seconds of pursed lip breathing.  Anticipate possible need for home O2 at discharge; will continue to monitor in subsequent sessions. Would benefit from skilled PT to address above deficits and promote optimal return to PLOF; Recommend transition to [place] upon discharge from acute hospitalization.     Follow Up Recommendations Home health PT    Equipment Recommendations  Rolling walker with 5" wheels    Recommendations for Other Services       Precautions / Restrictions Precautions Precautions: Fall Restrictions Weight Bearing Restrictions: No      Mobility  Bed Mobility Overal bed mobility: Modified Independent                Transfers Overall transfer level: Needs assistance Equipment used: Rolling walker (2 wheeled) Transfers: Sit to/from Stand Sit to Stand: Min assist         General transfer comment: requires UE support  to safely complete  Ambulation/Gait Ambulation/Gait assistance: Min assist;Min guard Gait Distance (Feet): 80 Feet Assistive device: Rolling walker (2 wheeled)       General Gait Details: reciprocal stepping pattern with fair step height/length; slow, cautious, gait speed; no overt buckling or LOB.  Desat to 88% on 3L, recovers >90% within 30-60 seconds seated rest and pursed lip breathing  Stairs            Wheelchair Mobility    Modified Rankin (Stroke Patients Only)       Balance Overall balance assessment: Needs assistance Sitting-balance support: No upper extremity supported;Feet supported Sitting balance-Leahy Scale: Good     Standing balance support: Bilateral upper extremity supported Standing balance-Leahy Scale: Fair                               Pertinent Vitals/Pain Pain Assessment: No/denies pain    Home Living                   Additional Comments: [facility]ident of [facility]    Prior Function Level of Independence: Independent with assistive device(s)         Comments: Mod indep without assist device for ADLs, household and limited community distances; ambulates to/from dining hall (approx 1 block from apartment) for main meal of the day; drives to/from during cold weather.  Home O2 at 3L.  Last fall in June, 2019 (fractured R wrist).     Hand Dominance   Dominant Hand: Right    Extremity/Trunk Assessment   Upper Extremity Assessment  Upper Extremity Assessment: Overall WFL for tasks assessed    Lower Extremity Assessment Lower Extremity Assessment: Overall WFL for tasks assessed(grossly at least 4/5 throughoug)       Communication   Communication: HOH  Cognition Arousal/Alertness: Awake/alert Behavior During Therapy: WFL for tasks assessed/performed Overall Cognitive Status: Within Functional Limits for tasks assessed                                        General Comments       Exercises Other Exercises Other Exercises: Sit/stand x3 with RW, cga/min assist; cuing for hand placement and overall safety Other Exercises: Educated in pursed lip breathing for optimal oxygenation at rest and with activity; patient voiced understanding. Other Exercises: Educated in use of RW for optimal safety/indep and overall energy conservation; patient voiced understanding (has one avaialble for use at discharge).   Assessment/Plan    PT Assessment Patient needs continued PT services  PT Problem List Decreased activity tolerance;Decreased balance;Decreased mobility;Cardiopulmonary status limiting activity;Decreased safety awareness       PT Treatment Interventions DME instruction;Gait training;Functional mobility training;Therapeutic activities;Therapeutic exercise;Balance training;Patient/family education    PT Goals (Current goals can be found in the Care Plan section)  Acute Rehab PT Goals Patient Stated Goal: to return home at discharge PT Goal Formulation: With patient/family Time For Goal Achievement: 12/23/18 Potential to Achieve Goals: Good    Frequency Min 2X/week   Barriers to discharge Decreased caregiver support      Co-evaluation               AM-PAC PT "6 Clicks" Mobility  Outcome Measure Help needed turning from your back to your side while in a flat bed without using bedrails?: None Help needed moving from lying on your back to sitting on the side of a flat bed without using bedrails?: None Help needed moving to and from a bed to a chair (including a wheelchair)?: A Little Help needed standing up from a chair using your arms (e.g., wheelchair or bedside chair)?: A Little Help needed to walk in hospital room?: A Little Help needed climbing 3-5 steps with a railing? : A Little 6 Click Score: 20    End of Session Equipment Utilized During Treatment: Gait belt;Oxygen Activity Tolerance: Patient tolerated treatment well Patient left: in  chair;with call bell/phone within reach;with chair alarm set(alarm in place, pad connected; CNA to replace batteries) Nurse Communication: Mobility status PT Visit Diagnosis: Muscle weakness (generalized) (M62.81);Difficulty in walking, not elsewhere classified (R26.2)    Time: 4128-7867 PT Time Calculation (min) (ACUTE ONLY): 28 min   Charges:   PT Evaluation $PT Eval Moderate Complexity: 1 Mod PT Treatments $Therapeutic Activity: 8-22 mins        Glendora Clouatre H. Owens Shark, PT, DPT, NCS 12/09/18, 11:45 AM [phone]

## 2018-12-09 NOTE — Care Management Note (Addendum)
Case Management Note  Patient Details  Name: KHAYLA KOPPENHAVER MRN: [id number] Date of Birth: 1928/07/22  Subjective/Objective:   Admitted to [facility] with the diagnosis of acute respiratory failure. Lives at the [place] x 3.5 years. Daughter is Optometrist. Sees Dr. Netty Starring as primary care physician. Prescriptions are filled at [facility].  Takes care of all basic activities of daily living herself. Daughter helps with errands,  [place] in the past,. No skilled nursing. Home oxygen per [facility] Uses 3 liters continuous.  Rolling walker and raised toilet seat in the home. Family will transport.                Action/Plan: Physical therapy is recommending home with home health/physical therapy. Medicare [place] query done. One copy placed on chart and one given to Ms. Craig. Will updated Floydene Flock, [facility] representative,    Expected Discharge Date:                  Expected Discharge Plan:     In-House Referral:   yes  Discharge planning Services   yes  Post Acute Care Choice:   yes Choice offered to:   Ms. Mare Loan  DME Arranged:    DME Agency:     HH Arranged:   yes [place] Agency:   [facility]  Status of Service:     If discussed at [place] of Stay Meetings, dates discussed:    Additional Comments:  [name], RN  MSN CCM Care Management [phone] 12/09/2018, 2:52 PM

## 2018-12-09 NOTE — Progress Notes (Signed)
[place] at [place] NAME: Melanie Huffman    MR#:  [id number]  DATE OF BIRTH:  [birth year]  SUBJECTIVE:  CHIEF COMPLAINT:   Chief Complaint  Patient presents with  . Shortness of Breath  . Cough    This morning patient reports continued improvement in shortness of breath and cough.  No fevers.  Daughter present at bedside.  Not feeling well enough for discharge yet.   REVIEW OF SYSTEMS:  Review of Systems  Constitutional: Negative for chills and fever.  HENT: Negative for ear pain and hearing loss.   Eyes: Negative for blurred vision and double vision.  Respiratory: Positive for cough and shortness of breath.   Cardiovascular: Negative for chest pain and palpitations.  Gastrointestinal: Negative for heartburn and nausea.  Genitourinary: Negative for dysuria and urgency.  Musculoskeletal: Negative for myalgias and neck pain.  Neurological: Negative for dizziness.  Psychiatric/Behavioral: Negative for depression and hallucinations.    DRUG ALLERGIES:   Allergies  Allergen Reactions  . Amlodipine Swelling     [Onset: 08/26/2013]feet and neck begin to swell  . Adhesive [Tape] Other (See Comments)    Skin is very thin and tears easily. Please use paper tape only  . Codeine Nausea Only  . Percocet [Oxycodone-Acetaminophen] Nausea And Vomiting    Patient took on an empty stomach. Good relief but unsettled. Still has not eaten today  . Sulfa Antibiotics Nausea Only and Rash    rash   VITALS:  Blood pressure (!) 175/100, pulse 72, temperature (!) 97.5 F (36.4 C), temperature source Oral, resp. rate 18, height 5\' 5"  (1.651 m), weight 50.8 kg, SpO2 94 %. PHYSICAL EXAMINATION:   Physical Exam  Constitutional: She is oriented to person, place, and time and well-developed, well-nourished, and in no distress.  HENT:  Head: Normocephalic and atraumatic.  Eyes: Pupils are equal, round, and reactive to light. Conjunctivae and EOM are  normal.  Neck: Normal range of motion. Neck supple.  Cardiovascular: Normal rate and regular rhythm.  Pulmonary/Chest: Effort normal and breath sounds normal.  Abdominal: Soft. Bowel sounds are normal.  Musculoskeletal: Normal range of motion.        General: No edema.     Comments: Patient has a stage I ulcer on the left ankle.  Neurological: She is alert and oriented to person, place, and time.  Skin: Skin is warm and dry. She is not diaphoretic.   LABORATORY PANEL:  Female CBC Recent Labs  Lab 12/08/18 0451  WBC 4.0  HGB 12.3  HCT 38.5  PLT 105*   ------------------------------------------------------------------------------------------------------------------ Chemistries  Recent Labs  Lab 12/08/18 0451  NA 130*  K 4.8  CL 95*  CO2 31  GLUCOSE 141*  BUN 18  CREATININE 0.63  CALCIUM 8.5*   RADIOLOGY:  No results found. ASSESSMENT AND PLAN:   85 year old elderly female patient with history of COPD, chronic respiratory failure, hypertension, dementia GERD, peripheral vascular disease presented to the emergency room for shortness of breath  1. Acute on chronic respiratory failure with hypoxia Patient already weaned off of BiPAP.   Patient's oxygen requirement already weaned down to 3 L with O2 sat of 94% this morning. Patient uses 3 L of home oxygen therapy at home. Improving with management of COPD exacerbation as outlined below.  2.Acute COPD exacerbation Initially started on IV Solu-Medrol 60 MDQ 6 hourly was initiated on admission.  Gradual tapering to 40 mg every 12 hourly to avoid steroid psychosis in  this elderly patient.  Empiric antibiotics with azithromycin and Rocephin. Aggressive nebulization therapy Monitor clinically  3.-Pleural effusions Does not appear to be symptomatic Continue Lasix for diuresis  4. Hyponatremia Sodium level stable at 130 recently.  Follow-up BMP in a.m.  5.  Dementia without behavioral disturbance. Stable  6.  Stage I  ulcer on left ankle. Present on admission.  Follow-up on wound care nurse recommendations.  7.  Debility due to multiple medical problems listed above. Physical therapy consult placed.  Please follow-up on recommendations.  -DVT prophylaxis with subcu Lovenox daily    All the records are reviewed and case discussed with Care Management/Social Worker. Management plans discussed with the patient, family; daughter and they are in agreement.  CODE STATUS: DNR  TOTAL TIME TAKING CARE OF THIS PATIENT: 33 minutes.   More than 50% of the time was spent in counseling/coordination of care: YES  POSSIBLE D/C IN 1-2 DAYS, DEPENDING ON CLINICAL CONDITION.   Melanie Huffman M.D on 12/09/2018 at 11:30 AM  Between 7am to 6pm - Pager - [phone]  After 6pm go to [email or web address] - Proofreader  [facility]  Office  [phone]  CC: Primary care physician; Dion Body, MD  Note: This dictation was prepared with Dragon dictation along with smaller phrase technology. Any transcriptional errors that result from this process are unintentional.

## 2018-12-10 LAB — BASIC METABOLIC PANEL
Anion gap: 5 (ref 5–15)
BUN: 19 mg/dL (ref 8–23)
CHLORIDE: 92 mmol/L — AB (ref 98–111)
CO2: 33 mmol/L — ABNORMAL HIGH (ref 22–32)
Calcium: 8.5 mg/dL — ABNORMAL LOW (ref 8.9–10.3)
Creatinine, Ser: 0.56 mg/dL (ref 0.44–1.00)
GFR calc Af Amer: >60 mL/min (ref >60)
GFR calc non Af Amer: >60 mL/min (ref >60)
Glucose, Bld: 136 mg/dL — ABNORMAL HIGH (ref 70–99)
Potassium: 4.8 mmol/L (ref 3.5–5.1)
Sodium: 130 mmol/L — ABNORMAL LOW (ref 135–145)

## 2018-12-10 LAB — MAGNESIUM: Magnesium: 2.1 mg/dL (ref 1.7–2.4)

## 2018-12-10 MED ORDER — PREDNISONE 20 MG PO TABS: 40.0000 mg | ORAL_TABLET | Freq: Every day | ORAL | 0 refills | Status: DC

## 2018-12-10 MED ORDER — GUAIFENESIN 100 MG/5ML PO SOLN
5.0000 mL | ORAL | Status: DC | PRN
  Filled 2018-12-10: qty 5

## 2018-12-10 MED ORDER — PREDNISONE 50 MG PO TABS
50.0000 mg | ORAL_TABLET | Freq: Every day | ORAL | Status: DC
  Administered 2018-12-10: 50 mg via ORAL
  Filled 2018-12-10: qty 1

## 2018-12-10 MED ORDER — GUAIFENESIN 100 MG/5ML PO SOLN: 5.0000 mL | Freq: Four times a day (QID) | ORAL | 0 refills | Status: DC | PRN

## 2018-12-10 NOTE — Care Management Important Message (Signed)
Important Message  Patient Details  Name: Melanie Huffman MRN: [id number] Date of Birth: [birth year]   Medicare Important Message Given:  Yes    Juliann Pulse A Tamera Pingley 12/10/2018, 10:34 AM

## 2018-12-10 NOTE — Care Management Important Message (Signed)
Important Message  Patient Details  Name: EVANA RUNNELS MRN: [id number] Date of Birth: [birth year]   Medicare Important Message Given:  Yes    Juliann Pulse A Letticia Bhattacharyya 12/10/2018, 10:34 AM

## 2018-12-10 NOTE — Care Management (Signed)
Discharge to home today per Dr. Bridgett Larsson. Will be followed by [facility] for Home Health services. Family will transport. [name] RN MSN CCM Care Management [phone]

## 2018-12-10 NOTE — Discharge Summary (Signed)
[place] at [place] NAME: Melanie Huffman    MR#:  [id number]  DATE OF BIRTH:  09-29-1928  DATE OF ADMISSION:  12/07/2018   ADMITTING PHYSICIAN: Saundra Shelling, MD  DATE OF DISCHARGE: 12/10/2018  PRIMARY CARE PHYSICIAN: Dion Body, MD   ADMISSION DIAGNOSIS:  Hyponatremia [E87.1] Acute on chronic respiratory failure with hypoxia and hypercapnia (HCC) [J96.21, J96.22] Acute respiratory failure (HCC) [J96.00] DISCHARGE DIAGNOSIS:  Active Problems:   Acute respiratory failure (HCC)   Decubitus ulcer of left ankle, stage 1  SECONDARY DIAGNOSIS:   Past Medical History:  Diagnosis Date  . Anxiety   . Arthritis 2019   mostly in neck  . Asthma   . Breast cancer ([place]) 1991   mastectomy left  . Cancer ([place]) 1991   L Breast  . CHF (congestive heart failure) ([place])   . COPD (chronic obstructive pulmonary disease) ([place])    hypoxia with resp failure admission in 03/2018  . Dementia ([place])    confusion beginning  . Emphysema of lung ([place])   . GERD (gastroesophageal reflux disease)   . Heart murmur   . History of hiatal hernia   . Hypertension 03/2018   recent hypertension urgency ..admitted in april 2019  . Hypoxia 03/2018   O2 saturation runs 86-91% on Room Air.  . Intermittent dysphagia   . Peripheral vascular disease ([place])   . Shortness of breath dyspnea   . Traumatic subdural hematoma [facility])    HOSPITAL COURSE:  85 year old elderly female patient with history of COPD, chronic respiratory failure, hypertension, dementia GERD, peripheral vascular disease presented to the emergency room for shortness of breath  1. Acute on chronic respiratory failure with hypoxia Patient already weaned off of BiPAP.   Patient's oxygen requirement already weaned down to 3 L with O2 sat of 94% (home O2). Patient uses 3 L of home oxygen therapy at home. Improving with management of COPD exacerbation as outlined below.  2.Acute COPD  exacerbation Initially started on IV Solu-Medrol 60 MDQ 6 hourly was initiated on admission.  Gradual tapering to 40 mg every 12 hourly to avoid steroid psychosis in this elderly patient.  Changed to po prednisone. Completed empiric antibiotics with azithromycin and Rocephin. Continue home NEB.  3.-Pleural effusions She is treated with Lasix for diuresis  4. Hyponatremia Sodium level stable at 130 recently.  Follow-up BMP as outpatient.  5.  Dementia without behavioral disturbance. Stable  6.  Stage I ulcer on left ankle. Present on admission.  Follow-up on wound care nurse recommendations.  7.  Debility due to multiple medical problems listed above. HHPT. DISCHARGE CONDITIONS:  Stable,discharge to home with HHPT today. CONSULTS OBTAINED:   DRUG ALLERGIES:   Allergies  Allergen Reactions  . Amlodipine Swelling     [Onset: 08/26/2013]feet and neck begin to swell  . Adhesive [Tape] Other (See Comments)    Skin is very thin and tears easily. Please use paper tape only  . Codeine Nausea Only  . Percocet [Oxycodone-Acetaminophen] Nausea And Vomiting    Patient took on an empty stomach. Good relief but unsettled. Still has not eaten today  . Sulfa Antibiotics Nausea Only and Rash    rash   DISCHARGE MEDICATIONS:   Allergies as of 12/10/2018      Reactions   Amlodipine Swelling    [Onset: 08/26/2013]feet and neck begin to swell   Adhesive [tape] Other (See Comments)   Skin is very thin and tears easily. Please use  paper tape only   Codeine Nausea Only   Percocet [oxycodone-acetaminophen] Nausea And Vomiting   Patient took on an empty stomach. Good relief but unsettled. Still has not eaten today   Sulfa Antibiotics Nausea Only, Rash   rash      Medication List    TAKE these medications   acetaminophen 325 MG tablet Commonly known as:  TYLENOL Take 2 tablets (650 mg total) by mouth every 6 (six) hours as needed for mild pain (or Fever >/= 101).   albuterol 108 (90  Base) MCG/ACT inhaler Commonly known as:  PROVENTIL HFA;VENTOLIN HFA Inhale 2 puffs into the lungs every 6 (six) hours as needed for wheezing or shortness of breath.   CALCIUM 600/VITAMIN D 600-400 MG-UNIT Tabs Generic drug:  Calcium Carbonate-Vitamin D3 Take 1 tablet by mouth daily.   carvedilol 25 MG tablet Commonly known as:  COREG Take 25 mg by mouth 2 (two) times daily with a meal.   CENTRUM SILVER PO Take 1 tablet by mouth daily.   cloNIDine 0.1 MG tablet Commonly known as:  CATAPRES Take 0.1 mg by mouth 2 (two) times daily.   escitalopram 10 MG tablet Commonly known as:  LEXAPRO Take 10 mg by mouth daily.   fluticasone furoate-vilanterol 100-25 MCG/INH Aepb Commonly known as:  BREO ELLIPTA Inhale 1 puff into the lungs daily.   furosemide 40 MG tablet Commonly known as:  LASIX Take 40 mg by mouth daily. And additional 20mg  PRN   guaiFENesin 100 MG/5ML Soln Commonly known as:  ROBITUSSIN Take 5 mLs (100 mg total) by mouth every 6 (six) hours as needed for cough or to loosen phlegm.   METAMUCIL FIBER PO Take 5 capsules by mouth 3 (three) times daily as needed.   omeprazole 20 MG capsule Commonly known as:  PRILOSEC Take 20 mg by mouth daily.   potassium chloride SA 20 MEQ tablet Commonly known as:  K-DUR,KLOR-CON Take 1 tablet (20 mEq total) by mouth daily. What changed:  when to take this   predniSONE 20 MG tablet Commonly known as:  DELTASONE Take 2 tablets (40 mg total) by mouth daily with breakfast.   raloxifene 60 MG tablet Commonly known as:  EVISTA Take 60 mg by mouth daily.   ramipril 10 MG capsule Commonly known as:  ALTACE Take 20 mg by mouth every morning.   tiotropium 18 MCG inhalation capsule Commonly known as:  SPIRIVA Place 18 mcg into inhaler and inhale daily.   traZODone 50 MG tablet Commonly known as:  DESYREL Take 50 mg by mouth at bedtime.   vitamin C 500 MG tablet Commonly known as:  ASCORBIC ACID Take 500 mg by mouth  daily.        DISCHARGE INSTRUCTIONS:  See AVS. If you experience worsening of your admission symptoms, develop shortness of breath, life threatening emergency, suicidal or homicidal thoughts you must seek medical attention immediately by calling 911 or calling your MD immediately  if symptoms less severe.  You Must read complete instructions/literature along with all the possible adverse reactions/side effects for all the Medicines you take and that have been prescribed to you. Take any new Medicines after you have completely understood and accpet all the possible adverse reactions/side effects.   Please note  You were cared for by a hospitalist during your hospital stay. If you have any questions about your discharge medications or the care you received while you were in the hospital after you are discharged, you can call the unit  and asked to speak with the hospitalist on call if the hospitalist that took care of you is not available. Once you are discharged, your primary care physician will handle any further medical issues. Please note that NO REFILLS for any discharge medications will be authorized once you are discharged, as it is imperative that you return to your primary care physician (or establish a relationship with a primary care physician if you do not have one) for your aftercare needs so that they can reassess your need for medications and monitor your lab values.    On the day of Discharge:  VITAL SIGNS:  Blood pressure (!) 178/91, pulse 66, temperature 97.7 F (36.5 C), temperature source Oral, resp. rate 18, height 5\' 5"  (1.651 m), weight 50.8 kg, SpO2 99 %. PHYSICAL EXAMINATION:  GENERAL:  85 y.o.-year-old patient lying in the bed with no acute distress.  EYES: Pupils equal, round, reactive to light and accommodation. No scleral icterus. Extraocular muscles intact.  HEENT: Head atraumatic, normocephalic. Oropharynx and nasopharynx clear.  NECK:  Supple, no jugular venous  distention. No thyroid enlargement, no tenderness.  LUNGS: Normal breath sounds bilaterally, no wheezing, rales,rhonchi or crepitation. No use of accessory muscles of respiration.  CARDIOVASCULAR: S1, S2 normal. No murmurs, rubs, or gallops.  ABDOMEN: Soft, non-tender, non-distended. Bowel sounds present. No organomegaly or mass.  EXTREMITIES: No pedal edema, cyanosis, or clubbing.  NEUROLOGIC: Cranial nerves II through XII are intact. Muscle strength 4/5 in all extremities. Sensation intact. Gait not checked.  PSYCHIATRIC: The patient is alert and oriented x 3.  SKIN: No obvious rash, lesion, or ulcer.  DATA REVIEW:   CBC Recent Labs  Lab 12/08/18 0451  WBC 4.0  HGB 12.3  HCT 38.5  PLT 105*    Chemistries  Recent Labs  Lab 12/10/18 0509  NA 130*  K 4.8  CL 92*  CO2 33*  GLUCOSE 136*  BUN 19  CREATININE 0.56  CALCIUM 8.5*  MG 2.1     Microbiology Results  Results for orders placed or performed during the hospital encounter of 12/07/18  Blood culture (routine x 2)     Status: None (Preliminary result)   Collection Time: 12/07/18 10:04 AM  Result Value Ref Range Status   Specimen Description BLOOD RIGHT ANTECUBITAL  Final   Special Requests   Final    BOTTLES DRAWN AEROBIC AND ANAEROBIC Blood Culture results may not be optimal due to an excessive volume of blood received in culture bottles   Culture   Final    NO GROWTH 3 DAYS Performed at [facility], [address], [place], [place] [address]    Report Status PENDING  Incomplete  Blood culture (routine x 2)     Status: None (Preliminary result)   Collection Time: 12/07/18 10:04 AM  Result Value Ref Range Status   Specimen Description BLOOD BLOOD RIGHT FOREARM  Final   Special Requests   Final    BOTTLES DRAWN AEROBIC AND ANAEROBIC Blood Culture results may not be optimal due to an excessive volume of blood received in culture bottles   Culture   Final    NO GROWTH 3 DAYS Performed at [facility], [address] [place], [place] [address]    Report Status PENDING  Incomplete    RADIOLOGY:  No results found.   Management plans discussed with the patient, family and they are in agreement.  CODE STATUS: DNR   TOTAL TIME TAKING CARE OF THIS PATIENT: 33 minutes.  Demetrios Loll M.D on 12/10/2018 at 12:13 PM  Between 7am to 6pm - Pager - [phone]  After 6pm go to [email or web address] - Proofreader  [facility]  Office  [phone]  CC: Primary care physician; Dion Body, MD   Note: This dictation was prepared with Dragon dictation along with smaller phrase technology. Any transcriptional errors that result from this process are unintentional.

## 2018-12-10 NOTE — Discharge Instructions (Signed)
HHPT °

## 2018-12-12 LAB — CULTURE, BLOOD (ROUTINE X 2)
CULTURE: NO GROWTH
Culture: NO GROWTH

## 2018-12-14 DIAGNOSIS — I739 Peripheral vascular disease, unspecified: Secondary | ICD-10-CM | POA: Diagnosis not present

## 2018-12-14 DIAGNOSIS — J441 Chronic obstructive pulmonary disease with (acute) exacerbation: Secondary | ICD-10-CM | POA: Diagnosis not present

## 2018-12-14 DIAGNOSIS — R1310 Dysphagia, unspecified: Secondary | ICD-10-CM | POA: Diagnosis not present

## 2018-12-14 DIAGNOSIS — M1990 Unspecified osteoarthritis, unspecified site: Secondary | ICD-10-CM | POA: Diagnosis not present

## 2018-12-14 DIAGNOSIS — E871 Hypo-osmolality and hyponatremia: Secondary | ICD-10-CM | POA: Diagnosis not present

## 2018-12-14 DIAGNOSIS — L89521 Pressure ulcer of left ankle, stage 1: Secondary | ICD-10-CM | POA: Diagnosis not present

## 2018-12-14 DIAGNOSIS — J9612 Chronic respiratory failure with hypercapnia: Secondary | ICD-10-CM | POA: Diagnosis not present

## 2018-12-14 DIAGNOSIS — Z87891 Personal history of nicotine dependence: Secondary | ICD-10-CM | POA: Diagnosis not present

## 2018-12-14 DIAGNOSIS — J9611 Chronic respiratory failure with hypoxia: Secondary | ICD-10-CM | POA: Diagnosis not present

## 2018-12-14 DIAGNOSIS — Z7951 Long term (current) use of inhaled steroids: Secondary | ICD-10-CM | POA: Diagnosis not present

## 2018-12-14 DIAGNOSIS — J90 Pleural effusion, not elsewhere classified: Secondary | ICD-10-CM | POA: Diagnosis not present

## 2018-12-14 DIAGNOSIS — Z853 Personal history of malignant neoplasm of breast: Secondary | ICD-10-CM | POA: Diagnosis not present

## 2018-12-14 DIAGNOSIS — I110 Hypertensive heart disease with heart failure: Secondary | ICD-10-CM | POA: Diagnosis not present

## 2018-12-14 DIAGNOSIS — I5022 Chronic systolic (congestive) heart failure: Secondary | ICD-10-CM | POA: Diagnosis not present

## 2018-12-14 DIAGNOSIS — K219 Gastro-esophageal reflux disease without esophagitis: Secondary | ICD-10-CM | POA: Diagnosis not present

## 2018-12-14 DIAGNOSIS — F0390 Unspecified dementia without behavioral disturbance: Secondary | ICD-10-CM | POA: Diagnosis not present

## 2018-12-14 DIAGNOSIS — Z9012 Acquired absence of left breast and nipple: Secondary | ICD-10-CM | POA: Diagnosis not present

## 2018-12-14 DIAGNOSIS — Z9981 Dependence on supplemental oxygen: Secondary | ICD-10-CM | POA: Diagnosis not present

## 2018-12-14 DIAGNOSIS — Z9181 History of falling: Secondary | ICD-10-CM | POA: Diagnosis not present

## 2018-12-14 DIAGNOSIS — F419 Anxiety disorder, unspecified: Secondary | ICD-10-CM | POA: Diagnosis not present

## 2018-12-16 DIAGNOSIS — J441 Chronic obstructive pulmonary disease with (acute) exacerbation: Secondary | ICD-10-CM | POA: Diagnosis not present

## 2018-12-22 ENCOUNTER — Other Ambulatory Visit: Payer: Self-pay

## 2018-12-22 NOTE — Patient Outreach (Signed)
[place] [facility]) Care Management  12/22/2018   Melanie Huffman 30-Nov-1928 [id number]      Outreach attempt # 2 to the patient for initial assessment. HIPAA verified. Discussed and offered [facility] care management services with patient. Patient verbally agreed to services. The patient verbalized that she does not like talking on the phone for long periods of time.   Social:The patient lives in the home alone.  She states that she is independent with her ADLS/IADLS.  Her daughter lives in [place] and tries to help when she can. The patient is able to drive but only goes short distances.  She states that she has pain in her neck that she rates at a 5/10.  She states that she has arthritis.  She states that she has fallen in the last year and broke her wrist in the past year.  She fell over her oxygen cord.  Discussed fall precautions  And patient verbalized understanding.  The durable medical equipment in the home consist of: scale, blood pressure cuff and oxygen.   Conditions:per chart review and speaking with the patient her conditions include:  CHF, HTN, PVD, COPD, GERD, Hyponatremia and Hypokalemia.  The patient states that she weighs herself daily. Her weight was 115 lbs. She states that she monitors her diet for salt.  She denies any chest pain or swelling.  The patient is on oxygen. The patient states that she has exercises that were given to her that she does in the home daily. .  Medications:The patient is on fifteen medications.  She states that she is taking her medications.  She did not express any problems with paying for her medications.  Appointments: Had an appointment with Dr Ubaldo Glassing on 12/07/18.  Has an appointment with Dr Netty Starring on 01/04/19  Advanced Directives: Patient has a health care power of attorney and living will.   Current Medications:  Current Outpatient Medications  Medication Sig Dispense Refill  . acetaminophen (TYLENOL) 325 MG tablet Take 2 tablets (650  mg total) by mouth every 6 (six) hours as needed for mild pain (or Fever >/= 101).    Marland Kitchen albuterol (PROVENTIL HFA;VENTOLIN HFA) 108 (90 Base) MCG/ACT inhaler Inhale 2 puffs into the lungs every 6 (six) hours as needed for wheezing or shortness of breath.    . Calcium Carbonate-Vitamin D3 (CALCIUM 600/VITAMIN D) 600-400 MG-UNIT TABS Take 1 tablet by mouth daily.    . carvedilol (COREG) 25 MG tablet Take 25 mg by mouth 2 (two) times daily with a meal.    . cloNIDine (CATAPRES) 0.1 MG tablet Take 0.1 mg by mouth 2 (two) times daily.  1  . escitalopram (LEXAPRO) 10 MG tablet Take 10 mg by mouth daily.    . fluticasone furoate-vilanterol (BREO ELLIPTA) 100-25 MCG/INH AEPB Inhale 1 puff into the lungs daily.    . furosemide (LASIX) 40 MG tablet Take 40 mg by mouth daily. And additional 20mg  PRN     . guaiFENesin (ROBITUSSIN) 100 MG/5ML SOLN Take 5 mLs (100 mg total) by mouth every 6 (six) hours as needed for cough or to loosen phlegm. 118 mL 0  . Multiple Vitamins-Minerals (CENTRUM SILVER PO) Take 1 tablet by mouth daily.    Marland Kitchen omeprazole (PRILOSEC) 20 MG capsule Take 20 mg by mouth daily.     . potassium chloride SA (K-DUR,KLOR-CON) 20 MEQ tablet Take 1 tablet (20 mEq total) by mouth daily. (Patient taking differently: Take 20 mEq by mouth 2 (two) times daily. ) 30 tablet  0  . Psyllium (METAMUCIL FIBER PO) Take 5 capsules by mouth 3 (three) times daily as needed.     . raloxifene (EVISTA) 60 MG tablet Take 60 mg by mouth daily.    . ramipril (ALTACE) 10 MG capsule Take 20 mg by mouth every morning.    . tiotropium (SPIRIVA) 18 MCG inhalation capsule Place 18 mcg into inhaler and inhale daily.     . traZODone (DESYREL) 50 MG tablet Take 50 mg by mouth at bedtime.    . vitamin C (ASCORBIC ACID) 500 MG tablet Take 500 mg by mouth daily.    . predniSONE (DELTASONE) 20 MG tablet Take 2 tablets (40 mg total) by mouth daily with breakfast. (Patient not taking: Reported on 12/22/2018) 8 tablet 0   No current  facility-administered medications for this visit.     Functional Status:  In your present state of health, do you have any difficulty performing the following activities: 12/22/2018 12/07/2018  Hearing? N N  Vision? N N  Difficulty concentrating or making decisions? N N  Walking or climbing stairs? Y Y  Dressing or bathing? N N  Doing errands, shopping? N N  Preparing Food and eating ? - -  Using the Toilet? - -  In the past six months, have you accidently leaked urine? - -  Do you have problems with loss of bowel control? - -  Managing your Medications? - -  Managing your Finances? - -  Housekeeping or managing your Housekeeping? - -  Some recent data might be hidden    Fall/Depression Screening: Fall Risk  12/22/2018 09/02/2018 08/06/2018  Falls in the past year? 1 Yes Yes  Number falls in past yr: 0 2 or more 2 or more  Injury with Fall? 1 Yes No  Comment broke her wrist - -  Risk Factor Category  - High Fall Risk High Fall Risk  Risk for fall due to : - History of fall(s);Impaired balance/gait History of fall(s);Impaired balance/gait  Follow up Falls evaluation completed;Education provided - Falls prevention discussed   PHQ 2/9 Scores 12/22/2018 09/02/2018 08/06/2018 08/03/2018  PHQ - 2 Score 1 1 0 0    Assessment: Patient will benefit from health coach outreach for disease management and support.   THN CM Care Plan Problem One     Most Recent Value  Care Plan Problem One  High Risk for  hospital readmission related to/ as evidenced by recent hospitalization for Acute on chronic respiratory failure with hypoxia and hypercapnia   Role Documenting the Problem One  [place] for Problem One  Active  THN Long Term Goal   Patient will not experience hospital readmission over the next 31 days   THN Long Term Goal Start Date  12/22/18  Interventions for Problem One Long Term Goal  Discussed with th epatient copd action plan, signs and symptoms, f/u doctor visits and  medication adherence.       Plan: RN Health Coach will provide ongoing education for patient on heart failure through phone calls and sending printed information to patient for further discussion.  RN Health Coach will send welcome packet with consent to patient as well as printed information on heart failure.  RN Health Coach will send initial barriers letter, assessment, and care plan to primary care physician.  RN Health Coach will contact patient in the month of February and patient agrees to next outreach.

## 2018-12-24 DIAGNOSIS — B351 Tinea unguium: Secondary | ICD-10-CM | POA: Diagnosis not present

## 2018-12-24 DIAGNOSIS — M79674 Pain in right toe(s): Secondary | ICD-10-CM | POA: Diagnosis not present

## 2018-12-24 DIAGNOSIS — M79675 Pain in left toe(s): Secondary | ICD-10-CM | POA: Diagnosis not present

## 2018-12-28 DIAGNOSIS — Z9981 Dependence on supplemental oxygen: Secondary | ICD-10-CM | POA: Diagnosis not present

## 2018-12-28 DIAGNOSIS — F419 Anxiety disorder, unspecified: Secondary | ICD-10-CM | POA: Diagnosis not present

## 2018-12-28 DIAGNOSIS — I5022 Chronic systolic (congestive) heart failure: Secondary | ICD-10-CM | POA: Diagnosis not present

## 2018-12-28 DIAGNOSIS — I739 Peripheral vascular disease, unspecified: Secondary | ICD-10-CM | POA: Diagnosis not present

## 2018-12-28 DIAGNOSIS — E871 Hypo-osmolality and hyponatremia: Secondary | ICD-10-CM | POA: Diagnosis not present

## 2018-12-28 DIAGNOSIS — J9612 Chronic respiratory failure with hypercapnia: Secondary | ICD-10-CM | POA: Diagnosis not present

## 2018-12-28 DIAGNOSIS — J441 Chronic obstructive pulmonary disease with (acute) exacerbation: Secondary | ICD-10-CM | POA: Diagnosis not present

## 2018-12-28 DIAGNOSIS — Z9012 Acquired absence of left breast and nipple: Secondary | ICD-10-CM | POA: Diagnosis not present

## 2018-12-28 DIAGNOSIS — F0390 Unspecified dementia without behavioral disturbance: Secondary | ICD-10-CM | POA: Diagnosis not present

## 2018-12-28 DIAGNOSIS — Z87891 Personal history of nicotine dependence: Secondary | ICD-10-CM | POA: Diagnosis not present

## 2018-12-28 DIAGNOSIS — L89521 Pressure ulcer of left ankle, stage 1: Secondary | ICD-10-CM | POA: Diagnosis not present

## 2018-12-28 DIAGNOSIS — Z9181 History of falling: Secondary | ICD-10-CM | POA: Diagnosis not present

## 2018-12-28 DIAGNOSIS — R1310 Dysphagia, unspecified: Secondary | ICD-10-CM | POA: Diagnosis not present

## 2018-12-28 DIAGNOSIS — I110 Hypertensive heart disease with heart failure: Secondary | ICD-10-CM | POA: Diagnosis not present

## 2018-12-28 DIAGNOSIS — J9611 Chronic respiratory failure with hypoxia: Secondary | ICD-10-CM | POA: Diagnosis not present

## 2018-12-28 DIAGNOSIS — M1990 Unspecified osteoarthritis, unspecified site: Secondary | ICD-10-CM | POA: Diagnosis not present

## 2018-12-28 DIAGNOSIS — Z7951 Long term (current) use of inhaled steroids: Secondary | ICD-10-CM | POA: Diagnosis not present

## 2018-12-28 DIAGNOSIS — K219 Gastro-esophageal reflux disease without esophagitis: Secondary | ICD-10-CM | POA: Diagnosis not present

## 2018-12-28 DIAGNOSIS — Z853 Personal history of malignant neoplasm of breast: Secondary | ICD-10-CM | POA: Diagnosis not present

## 2018-12-28 DIAGNOSIS — J90 Pleural effusion, not elsewhere classified: Secondary | ICD-10-CM | POA: Diagnosis not present

## 2019-01-04 DIAGNOSIS — F411 Generalized anxiety disorder: Secondary | ICD-10-CM | POA: Diagnosis not present

## 2019-01-04 DIAGNOSIS — I10 Essential (primary) hypertension: Secondary | ICD-10-CM | POA: Diagnosis not present

## 2019-01-04 DIAGNOSIS — D72819 Decreased white blood cell count, unspecified: Secondary | ICD-10-CM | POA: Diagnosis not present

## 2019-01-04 DIAGNOSIS — I5022 Chronic systolic (congestive) heart failure: Secondary | ICD-10-CM | POA: Diagnosis not present

## 2019-01-04 DIAGNOSIS — I700 Atherosclerosis of aorta: Secondary | ICD-10-CM | POA: Diagnosis not present

## 2019-01-04 DIAGNOSIS — Z0000 Encounter for general adult medical examination without abnormal findings: Secondary | ICD-10-CM | POA: Diagnosis not present

## 2019-01-05 DIAGNOSIS — J449 Chronic obstructive pulmonary disease, unspecified: Secondary | ICD-10-CM | POA: Diagnosis not present

## 2019-01-05 DIAGNOSIS — J9601 Acute respiratory failure with hypoxia: Secondary | ICD-10-CM | POA: Diagnosis not present

## 2019-01-07 DIAGNOSIS — F0390 Unspecified dementia without behavioral disturbance: Secondary | ICD-10-CM | POA: Diagnosis not present

## 2019-01-07 DIAGNOSIS — J90 Pleural effusion, not elsewhere classified: Secondary | ICD-10-CM | POA: Diagnosis not present

## 2019-01-07 DIAGNOSIS — J9612 Chronic respiratory failure with hypercapnia: Secondary | ICD-10-CM | POA: Diagnosis not present

## 2019-01-07 DIAGNOSIS — L89521 Pressure ulcer of left ankle, stage 1: Secondary | ICD-10-CM | POA: Diagnosis not present

## 2019-01-07 DIAGNOSIS — J9611 Chronic respiratory failure with hypoxia: Secondary | ICD-10-CM | POA: Diagnosis not present

## 2019-01-07 DIAGNOSIS — I110 Hypertensive heart disease with heart failure: Secondary | ICD-10-CM | POA: Diagnosis not present

## 2019-01-07 DIAGNOSIS — I5022 Chronic systolic (congestive) heart failure: Secondary | ICD-10-CM | POA: Diagnosis not present

## 2019-01-07 DIAGNOSIS — I10 Essential (primary) hypertension: Secondary | ICD-10-CM | POA: Diagnosis not present

## 2019-01-07 DIAGNOSIS — J449 Chronic obstructive pulmonary disease, unspecified: Secondary | ICD-10-CM | POA: Diagnosis not present

## 2019-01-07 DIAGNOSIS — J441 Chronic obstructive pulmonary disease with (acute) exacerbation: Secondary | ICD-10-CM | POA: Diagnosis not present

## 2019-01-10 DIAGNOSIS — J449 Chronic obstructive pulmonary disease, unspecified: Secondary | ICD-10-CM | POA: Diagnosis not present

## 2019-01-10 DIAGNOSIS — J9601 Acute respiratory failure with hypoxia: Secondary | ICD-10-CM | POA: Diagnosis not present

## 2019-01-19 DIAGNOSIS — C44629 Squamous cell carcinoma of skin of left upper limb, including shoulder: Secondary | ICD-10-CM | POA: Diagnosis not present

## 2019-01-24 ENCOUNTER — Ambulatory Visit: Payer: Self-pay

## 2019-01-26 ENCOUNTER — Ambulatory Visit: Payer: Self-pay

## 2019-01-26 ENCOUNTER — Other Ambulatory Visit: Payer: Self-pay

## 2019-01-26 NOTE — Patient Outreach (Signed)
[place] [facility]) Care Management  01/26/2019   Melanie Huffman 10/02/1928 [id number]  Subjective: Successful call to the patient.  HIPAA verified.  The patient states that she is doing fair.  She denies any chest pain, shortness of breath, swelling or falls.  She states that she had a cancer removed from her left hand last  and she is still having some discomfort.  Her weight today was 113.44 lbs.  She is taking her medication as prescribed.  She feels as though one of her medication makes her feel very sick in the morning and then by lunch time she is feeling better.  Encouraged the patient to speak with her physician about her issue.  She verbalized understanding.  She state that she monitors her food intake the best she can.  Living in her independent care apartment she is provided with one meal.  She said that they told her that salt is not used "but you never know".  Encouraged the patient to continue her effort with her health.  The patient has an appointment with the Cardiologist in May.   Current Medications:  Current Outpatient Medications  Medication Sig Dispense Refill  . acetaminophen (TYLENOL) 325 MG tablet Take 2 tablets (650 mg total) by mouth every 6 (six) hours as needed for mild pain (or Fever >/= 101).    Marland Kitchen albuterol (PROVENTIL HFA;VENTOLIN HFA) 108 (90 Base) MCG/ACT inhaler Inhale 2 puffs into the lungs every 6 (six) hours as needed for wheezing or shortness of breath.    . Calcium Carbonate-Vitamin D3 (CALCIUM 600/VITAMIN D) 600-400 MG-UNIT TABS Take 1 tablet by mouth daily.    . carvedilol (COREG) 25 MG tablet Take 25 mg by mouth 2 (two) times daily with a meal.    . cloNIDine (CATAPRES) 0.1 MG tablet Take 0.1 mg by mouth 2 (two) times daily.  1  . escitalopram (LEXAPRO) 10 MG tablet Take 10 mg by mouth daily.    . fluticasone furoate-vilanterol (BREO ELLIPTA) 100-25 MCG/INH AEPB Inhale 1 puff into the lungs daily.    . furosemide (LASIX) 40 MG tablet Take 40 mg  by mouth daily. And additional 20mg  PRN     . guaiFENesin (ROBITUSSIN) 100 MG/5ML SOLN Take 5 mLs (100 mg total) by mouth every 6 (six) hours as needed for cough or to loosen phlegm. 118 mL 0  . Multiple Vitamins-Minerals (CENTRUM SILVER PO) Take 1 tablet by mouth daily.    Marland Kitchen omeprazole (PRILOSEC) 20 MG capsule Take 20 mg by mouth daily.     . potassium chloride SA (K-DUR,KLOR-CON) 20 MEQ tablet Take 1 tablet (20 mEq total) by mouth daily. (Patient taking differently: Take 20 mEq by mouth 2 (two) times daily. ) 30 tablet 0  . predniSONE (DELTASONE) 20 MG tablet Take 2 tablets (40 mg total) by mouth daily with breakfast. 8 tablet 0  . Psyllium (METAMUCIL FIBER PO) Take 5 capsules by mouth 3 (three) times daily as needed.     . raloxifene (EVISTA) 60 MG tablet Take 60 mg by mouth daily.    . ramipril (ALTACE) 10 MG capsule Take 20 mg by mouth every morning.    . tiotropium (SPIRIVA) 18 MCG inhalation capsule Place 18 mcg into inhaler and inhale daily.     . traZODone (DESYREL) 50 MG tablet Take 50 mg by mouth at bedtime.    . vitamin C (ASCORBIC ACID) 500 MG tablet Take 500 mg by mouth daily.     No current facility-administered medications  for this visit.     Functional Status:  In your present state of health, do you have any difficulty performing the following activities: 12/22/2018 12/07/2018  Hearing? N N  Vision? N N  Difficulty concentrating or making decisions? N N  Walking or climbing stairs? Y Y  Dressing or bathing? N N  Doing errands, shopping? N N  Preparing Food and eating ? - -  Using the Toilet? - -  In the past six months, have you accidently leaked urine? - -  Do you have problems with loss of bowel control? - -  Managing your Medications? - -  Managing your Finances? - -  Housekeeping or managing your Housekeeping? - -  Some recent data might be hidden    Fall/Depression Screening: Fall Risk  01/26/2019 12/22/2018 09/02/2018  Falls in the past year? 0 1 Yes  Number  falls in past yr: - 0 2 or more  Injury with Fall? - 1 Yes  Comment - broke her wrist -  Risk Factor Category  - - High Fall Risk  Risk for fall due to : - - History of fall(s);Impaired balance/gait  Follow up - Falls evaluation completed;Education provided -   [facility] 2/9 Scores 12/22/2018 09/02/2018 08/06/2018 08/03/2018  PHQ - 2 Score 1 1 0 0    Assessment: Patient will continue to benefit from health coach outreach for disease management and support. THN CM Care Plan Problem Two     Most Recent Value  Care Plan for Problem Two  Active  Interventions for Problem Two Long Term Goal   Discusse with the patient about the signs and symptoms of chf, reviewed the chf action plan, encouraged the patient to continue to weigh herself,,  reviewed medications and encouraged the patient to coninue to take hermedications as prescribed.  THN Long Term Goal  Over the next 90 days, patient will not have readmission related to CHF as evidenced by patient reporting and review of EMR  [facility] Long Term Goal Start Date  01/26/19       Plan: [place] will contact patient in the month of May and patient agrees to next outreach.   [name] RN, BSN, [place] Direct Dial:  [phone]  Fax: [phone]

## 2019-02-03 DIAGNOSIS — J449 Chronic obstructive pulmonary disease, unspecified: Secondary | ICD-10-CM | POA: Diagnosis not present

## 2019-02-03 DIAGNOSIS — J9601 Acute respiratory failure with hypoxia: Secondary | ICD-10-CM | POA: Diagnosis not present

## 2019-02-08 DIAGNOSIS — J9601 Acute respiratory failure with hypoxia: Secondary | ICD-10-CM | POA: Diagnosis not present

## 2019-02-08 DIAGNOSIS — J449 Chronic obstructive pulmonary disease, unspecified: Secondary | ICD-10-CM | POA: Diagnosis not present

## 2019-03-06 DIAGNOSIS — J9601 Acute respiratory failure with hypoxia: Secondary | ICD-10-CM | POA: Diagnosis not present

## 2019-03-06 DIAGNOSIS — J449 Chronic obstructive pulmonary disease, unspecified: Secondary | ICD-10-CM | POA: Diagnosis not present

## 2019-03-11 DIAGNOSIS — J9601 Acute respiratory failure with hypoxia: Secondary | ICD-10-CM | POA: Diagnosis not present

## 2019-03-11 DIAGNOSIS — J449 Chronic obstructive pulmonary disease, unspecified: Secondary | ICD-10-CM | POA: Diagnosis not present

## 2019-03-11 DIAGNOSIS — C61 Malignant neoplasm of prostate: Secondary | ICD-10-CM | POA: Diagnosis not present

## 2019-03-21 DIAGNOSIS — I10 Essential (primary) hypertension: Secondary | ICD-10-CM | POA: Diagnosis not present

## 2019-03-21 DIAGNOSIS — I5022 Chronic systolic (congestive) heart failure: Secondary | ICD-10-CM | POA: Diagnosis not present

## 2019-03-21 DIAGNOSIS — Z9981 Dependence on supplemental oxygen: Secondary | ICD-10-CM | POA: Diagnosis not present

## 2019-03-21 DIAGNOSIS — R0609 Other forms of dyspnea: Secondary | ICD-10-CM | POA: Diagnosis not present

## 2019-03-21 DIAGNOSIS — I700 Atherosclerosis of aorta: Secondary | ICD-10-CM | POA: Diagnosis not present

## 2019-03-21 DIAGNOSIS — J449 Chronic obstructive pulmonary disease, unspecified: Secondary | ICD-10-CM | POA: Diagnosis not present

## 2019-04-05 DIAGNOSIS — J449 Chronic obstructive pulmonary disease, unspecified: Secondary | ICD-10-CM | POA: Diagnosis not present

## 2019-04-05 DIAGNOSIS — J9601 Acute respiratory failure with hypoxia: Secondary | ICD-10-CM | POA: Diagnosis not present

## 2019-04-10 DIAGNOSIS — J9601 Acute respiratory failure with hypoxia: Secondary | ICD-10-CM | POA: Diagnosis not present

## 2019-04-10 DIAGNOSIS — J449 Chronic obstructive pulmonary disease, unspecified: Secondary | ICD-10-CM | POA: Diagnosis not present

## 2019-04-12 ENCOUNTER — Other Ambulatory Visit: Payer: Self-pay

## 2019-04-12 NOTE — Patient Outreach (Signed)
[place] [facility]) Care Management  04/12/2019   Melanie Huffman 1928-07-12 [id number]  Subjective: Successful outreach to the patient.  Two patient identifiers given.  The patient states that she is doing fine.  She denies any shortness of breath, chest pain, swelling or falls.  She states that her weight today was 110.4 lbs.  She states that she saw her cardiologist Dr. Ubaldo Glassing and told him about her medication making her feel bad in the mornings.  She states that he stopped one of her medications and cut her medication that is a "little yellow pill in half".  She states that she is unable to tell me the name because her medications come prepackaged.  She states that she is feeling better. The patient states that she is eating well.  She states that she rarely leaves her home but if she does she is wearing a mask.  Mostly she said that she will ride with her daughter and just sit in the car to get out of her apartment.  She is scheduled for a visit with her cardiologist in June.   Current Medications:  Current Outpatient Medications  Medication Sig Dispense Refill  . acetaminophen (TYLENOL) 325 MG tablet Take 2 tablets (650 mg total) by mouth every 6 (six) hours as needed for mild pain (or Fever >/= 101).    Marland Kitchen albuterol (PROVENTIL HFA;VENTOLIN HFA) 108 (90 Base) MCG/ACT inhaler Inhale 2 puffs into the lungs every 6 (six) hours as needed for wheezing or shortness of breath.    . Calcium Carbonate-Vitamin D3 (CALCIUM 600/VITAMIN D) 600-400 MG-UNIT TABS Take 1 tablet by mouth daily.    . carvedilol (COREG) 25 MG tablet Take 25 mg by mouth 2 (two) times daily with a meal.    . cloNIDine (CATAPRES) 0.1 MG tablet Take 0.1 mg by mouth 2 (two) times daily.  1  . escitalopram (LEXAPRO) 10 MG tablet Take 10 mg by mouth daily.    . fluticasone furoate-vilanterol (BREO ELLIPTA) 100-25 MCG/INH AEPB Inhale 1 puff into the lungs daily.    . furosemide (LASIX) 40 MG tablet Take 40 mg by mouth daily. And  additional 20mg  PRN     . Multiple Vitamins-Minerals (CENTRUM SILVER PO) Take 1 tablet by mouth daily.    . potassium chloride SA (K-DUR,KLOR-CON) 20 MEQ tablet Take 1 tablet (20 mEq total) by mouth daily. (Patient taking differently: Take 20 mEq by mouth 2 (two) times daily. ) 30 tablet 0  . Psyllium (METAMUCIL FIBER PO) Take 5 capsules by mouth 3 (three) times daily as needed.     . raloxifene (EVISTA) 60 MG tablet Take 60 mg by mouth daily.    . ramipril (ALTACE) 10 MG capsule Take 20 mg by mouth every morning.    . tiotropium (SPIRIVA) 18 MCG inhalation capsule Place 18 mcg into inhaler and inhale daily.     . vitamin C (ASCORBIC ACID) 500 MG tablet Take 500 mg by mouth daily.    Marland Kitchen guaiFENesin (ROBITUSSIN) 100 MG/5ML SOLN Take 5 mLs (100 mg total) by mouth every 6 (six) hours as needed for cough or to loosen phlegm. (Patient not taking: Reported on 04/12/2019) 118 mL 0  . omeprazole (PRILOSEC) 20 MG capsule Take 20 mg by mouth daily.     . predniSONE (DELTASONE) 20 MG tablet Take 2 tablets (40 mg total) by mouth daily with breakfast. 8 tablet 0  . traZODone (DESYREL) 50 MG tablet Take 50 mg by mouth at bedtime.  No current facility-administered medications for this visit.     Functional Status:  In your present state of health, do you have any difficulty performing the following activities: 12/22/2018 12/07/2018  Hearing? N N  Vision? N N  Difficulty concentrating or making decisions? N N  Walking or climbing stairs? Y Y  Dressing or bathing? N N  Doing errands, shopping? N N  Preparing Food and eating ? - -  Using the Toilet? - -  In the past six months, have you accidently leaked urine? - -  Do you have problems with loss of bowel control? - -  Managing your Medications? - -  Managing your Finances? - -  Housekeeping or managing your Housekeeping? - -  Some recent data might be hidden    Fall/Depression Screening: Fall Risk  04/12/2019 01/26/2019 12/22/2018  Falls in the past  year? 0 0 1  Number falls in past yr: - - 0  Injury with Fall? - - 1  Comment - - broke her wrist  Risk Factor Category  - - -  Risk for fall due to : - - -  Follow up - - Falls evaluation completed;Education provided   [facility] 2/9 Scores 12/22/2018 09/02/2018 08/06/2018 08/03/2018  PHQ - 2 Score 1 1 0 0    Assessment: Patient will continue to benefit from health coach outreach for disease management and support. THN CM Care Plan Problem One     Most Recent Value  THN Long Term Goal   Patient will not experience hospital readmission over the next 90 days   THN Long Term Goal Start Date  04/12/19  Interventions for Problem One Long Term Goal  REveiwed signs and symptoms of CHF, encouraged the  patient to weigh daily, reveiwed medications and encouraged adherence and encourage adherence with appointments.       Plan: RN Health Coach will contact patient in the month of August and patient agrees to next outreach. RN Health Coach will make a referral to pharmacy to review medications.  [name] RN, BSN, [place] Direct Dial:  [phone]  Fax: [phone]

## 2019-04-13 ENCOUNTER — Other Ambulatory Visit: Payer: Self-pay | Admitting: Pharmacist

## 2019-04-13 NOTE — Patient Outreach (Signed)
[place] [facility]) Care Management  [place]   04/13/2019  Melanie Huffman 27-Apr-1928 [id number]  Reason for referral: Medication Management, question about which medications recently adjusted by Dr. Ubaldo Glassing  Referral source: [facility] RN Current insurance: [employer]  PMHx includes but not limited to:  HTN, CHF (EF 45% 7/'19), COPD, aortic atherosclerosis, GERD, allergic rhinitis, PVD, osteoporosis  Outreach:  1:37PM Call placed to patient who answered but requested that I call her back later this afternoon.   3:21PM Successful telephone call with Melanie Huffman.  HIPAA identifiers verified.   Subjective:  Patient reports she had 2 medication changes at recent appt with PCP which she thinks are clonidine and trazodone.   -Per review of notes in Care Everywhere, clonidine reduced to 0.05mg  BID, trazodone d/c'd, melatonin continued.   -Patient reports medication changes called into her pharmacy, [facility] -Patient currently receiving compliance packages.  She reports her daughter was able to fix compliance package by cutting clonidine in half for each dose and removing trazodone.   -Patient reports she is still using up the "old" compliance package and has 1 day left.   -Patient already had new compliance packages at home which she will start on Friday.  She is able to verify that new compliance packaging has correct clonidine dose and there is no trazodone included.        Objective: Lab Results  Component Value Date   CREATININE 0.56 12/10/2018   CREATININE 0.63 12/08/2018   CREATININE 0.61 12/07/2018    Lab Results  Component Value Date   HGBA1C 5.3 06/04/2018    Lipid Panel  No results found for: CHOL, TRIG, HDL, CHOLHDL, VLDL, LDLCALC, LDLDIRECT  BP Readings from Last 3 Encounters:  12/10/18 (!) 178/91  09/10/18 (!) 182/83  09/03/18 (!) 168/76    Allergies  Allergen Reactions  . Amlodipine Swelling     [Onset: 08/26/2013]feet and  neck begin to swell  . Adhesive [Tape] Other (See Comments)    Skin is very thin and tears easily. Please use paper tape only  . Codeine Nausea Only  . Percocet [Oxycodone-Acetaminophen] Nausea And Vomiting    Patient took on an empty stomach. Good relief but unsettled. Still has not eaten today  . Sulfa Antibiotics Nausea Only and Rash    rash    Medications Reviewed Today    Reviewed by [name], RN (Registered Nurse) on 04/12/19 at 2  Med List Status: <None>  Medication Order Taking? Sig Documenting Provider Last Dose Status Informant  acetaminophen (TYLENOL) 325 MG tablet [id number] Yes Take 2 tablets (650 mg total) by mouth every 6 (six) hours as needed for mild pain (or Fever >/= 101). Loletha Grayer, MD Taking Active Pharmacy Records  albuterol (PROVENTIL HFA;VENTOLIN HFA) 108 [phone] Base) MCG/ACT inhaler [id number] Yes Inhale 2 puffs into the lungs every 6 (six) hours as needed for wheezing or shortness of breath. [provider] Taking Active Pharmacy Records  Calcium Carbonate-Vitamin D3 (CALCIUM 600/VITAMIN D) 600-400 MG-UNIT TABS [id number] Yes Take 1 tablet by mouth daily. [provider] Taking Active Pharmacy Records  carvedilol (COREG) 25 MG tablet [id number] Yes Take 25 mg by mouth 2 (two) times daily with a meal. [provider] Taking Active Pharmacy Records           Med Note [place], Alfonse Spruce Dec 22, 2018  1:48 PM) Once a day   cloNIDine (CATAPRES) 0.1 MG tablet [id number] Yes Take 0.1 mg by  mouth 2 (two) times daily. [provider] Taking Active Pharmacy Records           Med Note [place], Alfonse Spruce Dec 22, 2018  1:48 PM) Once a day   escitalopram (LEXAPRO) 10 MG tablet [id number] Yes Take 10 mg by mouth daily. [provider] Taking Active Pharmacy Records  fluticasone furoate-vilanterol (BREO ELLIPTA) 100-25 MCG/INH AEPB [id number] Yes Inhale 1 puff into the lungs daily. [provider] Taking Active  Pharmacy Records  furosemide (LASIX) 40 MG tablet [id number] Yes Take 40 mg by mouth daily. And additional 20mg  PRN  [provider] Taking Active Pharmacy Records  guaiFENesin (ROBITUSSIN) 100 MG/5ML SOLN [id number] No Take 5 mLs (100 mg total) by mouth every 6 (six) hours as needed for cough or to loosen phlegm.  Patient not taking:  Reported on 04/12/2019   Demetrios Loll, MD Not Taking Active   Multiple Vitamins-Minerals (CENTRUM SILVER PO) [id number] Yes Take 1 tablet by mouth daily. [provider] Taking Active Pharmacy Records  omeprazole (PRILOSEC) 20 MG capsule [id number]  Take 20 mg by mouth daily.  [provider]  Active Pharmacy Records  potassium chloride SA (K-DUR,KLOR-CON) 20 MEQ tablet [id number] Yes Take 1 tablet (20 mEq total) by mouth daily.  Patient taking differently:  Take 20 mEq by mouth 2 (two) times daily.    Saundra Shelling, MD Taking Active Pharmacy Records           Med Note [place], Alfonse Spruce Dec 22, 2018  1:49 PM) Once a day   predniSONE (DELTASONE) 20 MG tablet [id number] No Take 2 tablets (40 mg total) by mouth daily with breakfast. Demetrios Loll, MD Unknown Active   Psyllium (METAMUCIL FIBER PO) [id number] Yes Take 5 capsules by mouth 3 (three) times daily as needed.  [provider] Taking Active Pharmacy Records  raloxifene (EVISTA) 60 MG tablet [id number] Yes Take 60 mg by mouth daily. [provider] Taking Active Pharmacy Records  ramipril (ALTACE) 10 MG capsule [id number] Yes Take 20 mg by mouth every morning. [provider] Taking Active Pharmacy Records  tiotropium [facility]) 18 MCG inhalation capsule [id number] Yes Place 18 mcg into inhaler and inhale daily.  [provider] Taking Active Pharmacy Records  traZODone (DESYREL) 50 MG tablet [id number] No Take 50 mg by mouth at bedtime. [provider] Not Taking Active Pharmacy Records  vitamin C (ASCORBIC ACID) 500 MG tablet [id number] Yes Take 500 mg  by mouth daily. [provider] Taking Active Pharmacy Records          Assessment: -Patient unable to read prescription labels for all other medications from compliance packs.  Shedeclines 3-way call to pharmacy to confirm doses.  She also declines call with her daughter to review medications.  She has no medication questions or concerns at this time.    Plan: . Will close [facility] pharmacy case as no further medication needs identified at this time.  Am happy to assist in the future as needed.    . Will update THN RN regarding confirmation of dose adjustments  Ralene Bathe, PharmD, [place] [phone]

## 2019-05-03 DIAGNOSIS — I700 Atherosclerosis of aorta: Secondary | ICD-10-CM | POA: Diagnosis not present

## 2019-05-03 DIAGNOSIS — I10 Essential (primary) hypertension: Secondary | ICD-10-CM | POA: Diagnosis not present

## 2019-05-03 DIAGNOSIS — D72819 Decreased white blood cell count, unspecified: Secondary | ICD-10-CM | POA: Diagnosis not present

## 2019-05-05 DIAGNOSIS — I5022 Chronic systolic (congestive) heart failure: Secondary | ICD-10-CM | POA: Diagnosis not present

## 2019-05-05 DIAGNOSIS — I700 Atherosclerosis of aorta: Secondary | ICD-10-CM | POA: Diagnosis not present

## 2019-05-05 DIAGNOSIS — I10 Essential (primary) hypertension: Secondary | ICD-10-CM | POA: Diagnosis not present

## 2019-05-06 DIAGNOSIS — J9601 Acute respiratory failure with hypoxia: Secondary | ICD-10-CM | POA: Diagnosis not present

## 2019-05-06 DIAGNOSIS — J449 Chronic obstructive pulmonary disease, unspecified: Secondary | ICD-10-CM | POA: Diagnosis not present

## 2019-05-10 DIAGNOSIS — I10 Essential (primary) hypertension: Secondary | ICD-10-CM | POA: Diagnosis not present

## 2019-05-10 DIAGNOSIS — D72819 Decreased white blood cell count, unspecified: Secondary | ICD-10-CM | POA: Diagnosis not present

## 2019-05-10 DIAGNOSIS — D696 Thrombocytopenia, unspecified: Secondary | ICD-10-CM | POA: Diagnosis not present

## 2019-05-11 DIAGNOSIS — J9601 Acute respiratory failure with hypoxia: Secondary | ICD-10-CM | POA: Diagnosis not present

## 2019-05-11 DIAGNOSIS — J449 Chronic obstructive pulmonary disease, unspecified: Secondary | ICD-10-CM | POA: Diagnosis not present

## 2019-05-16 DIAGNOSIS — B351 Tinea unguium: Secondary | ICD-10-CM | POA: Diagnosis not present

## 2019-05-16 DIAGNOSIS — M79674 Pain in right toe(s): Secondary | ICD-10-CM | POA: Diagnosis not present

## 2019-05-16 DIAGNOSIS — M79675 Pain in left toe(s): Secondary | ICD-10-CM | POA: Diagnosis not present

## 2019-05-23 ENCOUNTER — Other Ambulatory Visit: Payer: Self-pay

## 2019-05-23 NOTE — Patient Outreach (Addendum)
[place] [facility]) Care Management  05/23/2019  Melanie Huffman 02-16-1928 [id number]    RN Health Coach closing the program.  Patient is transitioning to external program [facility] for continued case management.  [name] RN, BSN, [place] Direct Dial:  [phone]  Fax: [phone]

## 2019-06-05 DIAGNOSIS — J9601 Acute respiratory failure with hypoxia: Secondary | ICD-10-CM | POA: Diagnosis not present

## 2019-06-05 DIAGNOSIS — J449 Chronic obstructive pulmonary disease, unspecified: Secondary | ICD-10-CM | POA: Diagnosis not present

## 2019-06-10 DIAGNOSIS — J449 Chronic obstructive pulmonary disease, unspecified: Secondary | ICD-10-CM | POA: Diagnosis not present

## 2019-06-10 DIAGNOSIS — J9601 Acute respiratory failure with hypoxia: Secondary | ICD-10-CM | POA: Diagnosis not present

## 2019-07-11 DIAGNOSIS — J449 Chronic obstructive pulmonary disease, unspecified: Secondary | ICD-10-CM | POA: Diagnosis not present

## 2019-07-11 DIAGNOSIS — J9601 Acute respiratory failure with hypoxia: Secondary | ICD-10-CM | POA: Diagnosis not present

## 2019-07-13 ENCOUNTER — Ambulatory Visit: Payer: PPO

## 2019-08-04 DIAGNOSIS — I700 Atherosclerosis of aorta: Secondary | ICD-10-CM | POA: Diagnosis not present

## 2019-08-04 DIAGNOSIS — R0602 Shortness of breath: Secondary | ICD-10-CM | POA: Diagnosis not present

## 2019-08-04 DIAGNOSIS — I10 Essential (primary) hypertension: Secondary | ICD-10-CM | POA: Diagnosis not present

## 2019-08-04 DIAGNOSIS — I5189 Other ill-defined heart diseases: Secondary | ICD-10-CM | POA: Diagnosis not present

## 2019-08-04 DIAGNOSIS — J449 Chronic obstructive pulmonary disease, unspecified: Secondary | ICD-10-CM | POA: Diagnosis not present

## 2019-08-04 DIAGNOSIS — I5022 Chronic systolic (congestive) heart failure: Secondary | ICD-10-CM | POA: Diagnosis not present

## 2019-08-09 ENCOUNTER — Other Ambulatory Visit: Payer: Self-pay | Admitting: Family Medicine

## 2019-08-09 DIAGNOSIS — Z1231 Encounter for screening mammogram for malignant neoplasm of breast: Secondary | ICD-10-CM

## 2019-08-11 DIAGNOSIS — J449 Chronic obstructive pulmonary disease, unspecified: Secondary | ICD-10-CM | POA: Diagnosis not present

## 2019-08-11 DIAGNOSIS — J9601 Acute respiratory failure with hypoxia: Secondary | ICD-10-CM | POA: Diagnosis not present

## 2019-08-15 DIAGNOSIS — H43813 Vitreous degeneration, bilateral: Secondary | ICD-10-CM | POA: Diagnosis not present

## 2019-08-17 DIAGNOSIS — D696 Thrombocytopenia, unspecified: Secondary | ICD-10-CM | POA: Diagnosis not present

## 2019-08-17 DIAGNOSIS — I10 Essential (primary) hypertension: Secondary | ICD-10-CM | POA: Diagnosis not present

## 2019-08-17 DIAGNOSIS — D72819 Decreased white blood cell count, unspecified: Secondary | ICD-10-CM | POA: Diagnosis not present

## 2019-08-23 DIAGNOSIS — Z136 Encounter for screening for cardiovascular disorders: Secondary | ICD-10-CM | POA: Diagnosis not present

## 2019-08-23 DIAGNOSIS — D72819 Decreased white blood cell count, unspecified: Secondary | ICD-10-CM | POA: Diagnosis not present

## 2019-08-23 DIAGNOSIS — I10 Essential (primary) hypertension: Secondary | ICD-10-CM | POA: Diagnosis not present

## 2019-08-23 DIAGNOSIS — D696 Thrombocytopenia, unspecified: Secondary | ICD-10-CM | POA: Diagnosis not present

## 2019-08-25 DIAGNOSIS — B351 Tinea unguium: Secondary | ICD-10-CM | POA: Diagnosis not present

## 2019-08-25 DIAGNOSIS — M79675 Pain in left toe(s): Secondary | ICD-10-CM | POA: Diagnosis not present

## 2019-08-25 DIAGNOSIS — M79674 Pain in right toe(s): Secondary | ICD-10-CM | POA: Diagnosis not present

## 2019-08-29 DIAGNOSIS — J449 Chronic obstructive pulmonary disease, unspecified: Secondary | ICD-10-CM | POA: Diagnosis not present

## 2019-08-29 DIAGNOSIS — Z9981 Dependence on supplemental oxygen: Secondary | ICD-10-CM | POA: Diagnosis not present

## 2019-08-29 DIAGNOSIS — R0600 Dyspnea, unspecified: Secondary | ICD-10-CM | POA: Diagnosis not present

## 2019-09-10 DIAGNOSIS — J9601 Acute respiratory failure with hypoxia: Secondary | ICD-10-CM | POA: Diagnosis not present

## 2019-09-10 DIAGNOSIS — J449 Chronic obstructive pulmonary disease, unspecified: Secondary | ICD-10-CM | POA: Diagnosis not present

## 2019-09-20 ENCOUNTER — Ambulatory Visit
Admission: RE | Admit: 2019-09-20 | Discharge: 2019-09-20 | Disposition: A | Discharge status: Home / Self Care | Payer: PPO | Source: Ambulatory Visit | Attending: Family Medicine | Admitting: Family Medicine

## 2019-09-20 DIAGNOSIS — Z1231 Encounter for screening mammogram for malignant neoplasm of breast: Secondary | ICD-10-CM | POA: Diagnosis not present

## 2019-09-22 ENCOUNTER — Other Ambulatory Visit: Payer: Self-pay | Admitting: Family Medicine

## 2019-09-22 DIAGNOSIS — R928 Other abnormal and inconclusive findings on diagnostic imaging of breast: Secondary | ICD-10-CM

## 2019-09-22 DIAGNOSIS — N6489 Other specified disorders of breast: Secondary | ICD-10-CM

## 2019-10-04 ENCOUNTER — Ambulatory Visit
Admission: RE | Admit: 2019-10-04 | Discharge: 2019-10-04 | Disposition: A | Discharge status: Home / Self Care | Payer: PPO | Source: Ambulatory Visit | Attending: Family Medicine | Admitting: Family Medicine

## 2019-10-04 DIAGNOSIS — R928 Other abnormal and inconclusive findings on diagnostic imaging of breast: Secondary | ICD-10-CM

## 2019-10-04 DIAGNOSIS — N6489 Other specified disorders of breast: Secondary | ICD-10-CM | POA: Diagnosis not present

## 2019-10-04 DIAGNOSIS — R922 Inconclusive mammogram: Secondary | ICD-10-CM | POA: Diagnosis not present

## 2019-10-05 ENCOUNTER — Other Ambulatory Visit: Payer: Self-pay | Admitting: Family Medicine

## 2019-10-05 DIAGNOSIS — N6489 Other specified disorders of breast: Secondary | ICD-10-CM

## 2019-10-11 DIAGNOSIS — J9601 Acute respiratory failure with hypoxia: Secondary | ICD-10-CM | POA: Diagnosis not present

## 2019-10-11 DIAGNOSIS — J449 Chronic obstructive pulmonary disease, unspecified: Secondary | ICD-10-CM | POA: Diagnosis not present

## 2019-11-10 DIAGNOSIS — J449 Chronic obstructive pulmonary disease, unspecified: Secondary | ICD-10-CM | POA: Diagnosis not present

## 2019-11-10 DIAGNOSIS — J9601 Acute respiratory failure with hypoxia: Secondary | ICD-10-CM | POA: Diagnosis not present

## 2019-11-14 DIAGNOSIS — Z136 Encounter for screening for cardiovascular disorders: Secondary | ICD-10-CM | POA: Diagnosis not present

## 2019-11-14 DIAGNOSIS — I10 Essential (primary) hypertension: Secondary | ICD-10-CM | POA: Diagnosis not present

## 2019-11-14 DIAGNOSIS — D72819 Decreased white blood cell count, unspecified: Secondary | ICD-10-CM | POA: Diagnosis not present

## 2019-11-14 DIAGNOSIS — D696 Thrombocytopenia, unspecified: Secondary | ICD-10-CM | POA: Diagnosis not present

## 2019-11-18 DIAGNOSIS — Z0000 Encounter for general adult medical examination without abnormal findings: Secondary | ICD-10-CM | POA: Diagnosis not present

## 2019-11-18 DIAGNOSIS — I10 Essential (primary) hypertension: Secondary | ICD-10-CM | POA: Diagnosis not present

## 2019-11-23 DIAGNOSIS — I10 Essential (primary) hypertension: Secondary | ICD-10-CM | POA: Diagnosis not present

## 2019-11-23 DIAGNOSIS — I5022 Chronic systolic (congestive) heart failure: Secondary | ICD-10-CM | POA: Diagnosis not present

## 2019-11-23 DIAGNOSIS — J449 Chronic obstructive pulmonary disease, unspecified: Secondary | ICD-10-CM | POA: Diagnosis not present

## 2019-11-23 DIAGNOSIS — I700 Atherosclerosis of aorta: Secondary | ICD-10-CM | POA: Diagnosis not present

## 2019-11-23 DIAGNOSIS — R0602 Shortness of breath: Secondary | ICD-10-CM | POA: Diagnosis not present

## 2019-12-01 DIAGNOSIS — Z20828 Contact with and (suspected) exposure to other viral communicable diseases: Secondary | ICD-10-CM | POA: Diagnosis not present

## 2019-12-07 DIAGNOSIS — B351 Tinea unguium: Secondary | ICD-10-CM | POA: Diagnosis not present

## 2019-12-07 DIAGNOSIS — L600 Ingrowing nail: Secondary | ICD-10-CM | POA: Diagnosis not present

## 2019-12-07 DIAGNOSIS — M79675 Pain in left toe(s): Secondary | ICD-10-CM | POA: Diagnosis not present

## 2019-12-07 DIAGNOSIS — M79674 Pain in right toe(s): Secondary | ICD-10-CM | POA: Diagnosis not present

## 2019-12-11 DIAGNOSIS — J9601 Acute respiratory failure with hypoxia: Secondary | ICD-10-CM | POA: Diagnosis not present

## 2019-12-11 DIAGNOSIS — J449 Chronic obstructive pulmonary disease, unspecified: Secondary | ICD-10-CM | POA: Diagnosis not present

## 2020-01-06 DIAGNOSIS — M65872 Other synovitis and tenosynovitis, left ankle and foot: Secondary | ICD-10-CM | POA: Diagnosis not present

## 2020-01-06 DIAGNOSIS — M79672 Pain in left foot: Secondary | ICD-10-CM | POA: Diagnosis not present

## 2020-01-06 DIAGNOSIS — S90122A Contusion of left lesser toe(s) without damage to nail, initial encounter: Secondary | ICD-10-CM | POA: Diagnosis not present

## 2020-01-06 DIAGNOSIS — S9032XA Contusion of left foot, initial encounter: Secondary | ICD-10-CM | POA: Diagnosis not present

## 2020-01-11 DIAGNOSIS — J449 Chronic obstructive pulmonary disease, unspecified: Secondary | ICD-10-CM | POA: Diagnosis not present

## 2020-01-11 DIAGNOSIS — J9601 Acute respiratory failure with hypoxia: Secondary | ICD-10-CM | POA: Diagnosis not present

## 2020-01-31 DIAGNOSIS — Z9981 Dependence on supplemental oxygen: Secondary | ICD-10-CM | POA: Diagnosis not present

## 2020-01-31 DIAGNOSIS — R0600 Dyspnea, unspecified: Secondary | ICD-10-CM | POA: Diagnosis not present

## 2020-01-31 DIAGNOSIS — J310 Chronic rhinitis: Secondary | ICD-10-CM | POA: Diagnosis not present

## 2020-01-31 DIAGNOSIS — J449 Chronic obstructive pulmonary disease, unspecified: Secondary | ICD-10-CM | POA: Diagnosis not present

## 2020-02-08 DIAGNOSIS — J449 Chronic obstructive pulmonary disease, unspecified: Secondary | ICD-10-CM | POA: Diagnosis not present

## 2020-02-08 DIAGNOSIS — J9601 Acute respiratory failure with hypoxia: Secondary | ICD-10-CM | POA: Diagnosis not present

## 2020-02-21 DIAGNOSIS — I10 Essential (primary) hypertension: Secondary | ICD-10-CM | POA: Diagnosis not present

## 2020-02-21 DIAGNOSIS — J449 Chronic obstructive pulmonary disease, unspecified: Secondary | ICD-10-CM | POA: Diagnosis not present

## 2020-02-21 DIAGNOSIS — I700 Atherosclerosis of aorta: Secondary | ICD-10-CM | POA: Diagnosis not present

## 2020-02-21 DIAGNOSIS — I5022 Chronic systolic (congestive) heart failure: Secondary | ICD-10-CM | POA: Diagnosis not present

## 2020-03-07 DIAGNOSIS — L600 Ingrowing nail: Secondary | ICD-10-CM | POA: Diagnosis not present

## 2020-03-07 DIAGNOSIS — L03031 Cellulitis of right toe: Secondary | ICD-10-CM | POA: Diagnosis not present

## 2020-03-07 DIAGNOSIS — M79675 Pain in left toe(s): Secondary | ICD-10-CM | POA: Diagnosis not present

## 2020-03-07 DIAGNOSIS — B351 Tinea unguium: Secondary | ICD-10-CM | POA: Diagnosis not present

## 2020-03-07 DIAGNOSIS — M79674 Pain in right toe(s): Secondary | ICD-10-CM | POA: Diagnosis not present

## 2020-03-10 DIAGNOSIS — J9601 Acute respiratory failure with hypoxia: Secondary | ICD-10-CM | POA: Diagnosis not present

## 2020-03-10 DIAGNOSIS — J449 Chronic obstructive pulmonary disease, unspecified: Secondary | ICD-10-CM | POA: Diagnosis not present

## 2020-04-09 DIAGNOSIS — J9601 Acute respiratory failure with hypoxia: Secondary | ICD-10-CM | POA: Diagnosis not present

## 2020-04-09 DIAGNOSIS — J449 Chronic obstructive pulmonary disease, unspecified: Secondary | ICD-10-CM | POA: Diagnosis not present

## 2020-05-10 DIAGNOSIS — J9601 Acute respiratory failure with hypoxia: Secondary | ICD-10-CM | POA: Diagnosis not present

## 2020-05-10 DIAGNOSIS — J449 Chronic obstructive pulmonary disease, unspecified: Secondary | ICD-10-CM | POA: Diagnosis not present

## 2020-05-23 DIAGNOSIS — I700 Atherosclerosis of aorta: Secondary | ICD-10-CM | POA: Diagnosis not present

## 2020-05-23 DIAGNOSIS — J449 Chronic obstructive pulmonary disease, unspecified: Secondary | ICD-10-CM | POA: Diagnosis not present

## 2020-05-23 DIAGNOSIS — I5022 Chronic systolic (congestive) heart failure: Secondary | ICD-10-CM | POA: Diagnosis not present

## 2020-05-23 DIAGNOSIS — I10 Essential (primary) hypertension: Secondary | ICD-10-CM | POA: Diagnosis not present

## 2020-06-01 DIAGNOSIS — I700 Atherosclerosis of aorta: Secondary | ICD-10-CM | POA: Diagnosis not present

## 2020-06-01 DIAGNOSIS — I10 Essential (primary) hypertension: Secondary | ICD-10-CM | POA: Diagnosis not present

## 2020-06-01 DIAGNOSIS — D696 Thrombocytopenia, unspecified: Secondary | ICD-10-CM | POA: Diagnosis not present

## 2020-06-07 DIAGNOSIS — M79674 Pain in right toe(s): Secondary | ICD-10-CM | POA: Diagnosis not present

## 2020-06-07 DIAGNOSIS — M79675 Pain in left toe(s): Secondary | ICD-10-CM | POA: Diagnosis not present

## 2020-06-07 DIAGNOSIS — B351 Tinea unguium: Secondary | ICD-10-CM | POA: Diagnosis not present

## 2020-06-07 DIAGNOSIS — L600 Ingrowing nail: Secondary | ICD-10-CM | POA: Diagnosis not present

## 2020-06-08 DIAGNOSIS — E871 Hypo-osmolality and hyponatremia: Secondary | ICD-10-CM | POA: Diagnosis not present

## 2020-06-08 DIAGNOSIS — F411 Generalized anxiety disorder: Secondary | ICD-10-CM | POA: Diagnosis not present

## 2020-06-08 DIAGNOSIS — I10 Essential (primary) hypertension: Secondary | ICD-10-CM | POA: Diagnosis not present

## 2020-06-08 DIAGNOSIS — Z0000 Encounter for general adult medical examination without abnormal findings: Secondary | ICD-10-CM | POA: Diagnosis not present

## 2020-06-08 DIAGNOSIS — Z136 Encounter for screening for cardiovascular disorders: Secondary | ICD-10-CM | POA: Diagnosis not present

## 2020-06-08 DIAGNOSIS — D696 Thrombocytopenia, unspecified: Secondary | ICD-10-CM | POA: Diagnosis not present

## 2020-06-09 DIAGNOSIS — J449 Chronic obstructive pulmonary disease, unspecified: Secondary | ICD-10-CM | POA: Diagnosis not present

## 2020-06-09 DIAGNOSIS — J9601 Acute respiratory failure with hypoxia: Secondary | ICD-10-CM | POA: Diagnosis not present

## 2020-06-12 DIAGNOSIS — J449 Chronic obstructive pulmonary disease, unspecified: Secondary | ICD-10-CM | POA: Diagnosis not present

## 2020-06-12 DIAGNOSIS — J310 Chronic rhinitis: Secondary | ICD-10-CM | POA: Diagnosis not present

## 2020-06-12 DIAGNOSIS — R0600 Dyspnea, unspecified: Secondary | ICD-10-CM | POA: Diagnosis not present

## 2020-06-12 DIAGNOSIS — Z9981 Dependence on supplemental oxygen: Secondary | ICD-10-CM | POA: Diagnosis not present

## 2020-06-25 DIAGNOSIS — E871 Hypo-osmolality and hyponatremia: Secondary | ICD-10-CM | POA: Diagnosis not present

## 2020-06-25 DIAGNOSIS — D696 Thrombocytopenia, unspecified: Secondary | ICD-10-CM | POA: Diagnosis not present

## 2020-06-25 DIAGNOSIS — R531 Weakness: Secondary | ICD-10-CM | POA: Diagnosis not present

## 2020-06-25 DIAGNOSIS — I10 Essential (primary) hypertension: Secondary | ICD-10-CM | POA: Diagnosis not present

## 2020-07-10 DIAGNOSIS — J449 Chronic obstructive pulmonary disease, unspecified: Secondary | ICD-10-CM | POA: Diagnosis not present

## 2020-07-10 DIAGNOSIS — J9601 Acute respiratory failure with hypoxia: Secondary | ICD-10-CM | POA: Diagnosis not present

## 2020-08-10 DIAGNOSIS — J9601 Acute respiratory failure with hypoxia: Secondary | ICD-10-CM | POA: Diagnosis not present

## 2020-08-10 DIAGNOSIS — J449 Chronic obstructive pulmonary disease, unspecified: Secondary | ICD-10-CM | POA: Diagnosis not present

## 2020-08-16 DIAGNOSIS — Z961 Presence of intraocular lens: Secondary | ICD-10-CM | POA: Diagnosis not present

## 2020-08-28 DIAGNOSIS — J441 Chronic obstructive pulmonary disease with (acute) exacerbation: Secondary | ICD-10-CM | POA: Diagnosis not present

## 2020-08-28 DIAGNOSIS — Z23 Encounter for immunization: Secondary | ICD-10-CM | POA: Diagnosis not present

## 2020-08-28 DIAGNOSIS — R0600 Dyspnea, unspecified: Secondary | ICD-10-CM | POA: Diagnosis not present

## 2020-08-28 DIAGNOSIS — Z9981 Dependence on supplemental oxygen: Secondary | ICD-10-CM | POA: Diagnosis not present

## 2020-08-28 DIAGNOSIS — J439 Emphysema, unspecified: Secondary | ICD-10-CM | POA: Diagnosis not present

## 2020-08-28 DIAGNOSIS — I5022 Chronic systolic (congestive) heart failure: Secondary | ICD-10-CM | POA: Diagnosis not present

## 2020-08-28 DIAGNOSIS — R531 Weakness: Secondary | ICD-10-CM | POA: Diagnosis not present

## 2020-09-09 DIAGNOSIS — J9601 Acute respiratory failure with hypoxia: Secondary | ICD-10-CM | POA: Diagnosis not present

## 2020-09-09 DIAGNOSIS — J449 Chronic obstructive pulmonary disease, unspecified: Secondary | ICD-10-CM | POA: Diagnosis not present

## 2020-09-10 DIAGNOSIS — B351 Tinea unguium: Secondary | ICD-10-CM | POA: Diagnosis not present

## 2020-09-10 DIAGNOSIS — M79675 Pain in left toe(s): Secondary | ICD-10-CM | POA: Diagnosis not present

## 2020-09-10 DIAGNOSIS — L600 Ingrowing nail: Secondary | ICD-10-CM | POA: Diagnosis not present

## 2020-09-10 DIAGNOSIS — M79674 Pain in right toe(s): Secondary | ICD-10-CM | POA: Diagnosis not present

## 2020-10-10 ENCOUNTER — Emergency Department: Payer: PPO

## 2020-10-10 ENCOUNTER — Inpatient Hospital Stay: Payer: PPO

## 2020-10-10 ENCOUNTER — Encounter: Payer: Self-pay | Admitting: Internal Medicine

## 2020-10-10 ENCOUNTER — Inpatient Hospital Stay: Payer: PPO | Admitting: Anesthesiology

## 2020-10-10 ENCOUNTER — Other Ambulatory Visit: Payer: Self-pay

## 2020-10-10 ENCOUNTER — Inpatient Hospital Stay
Admission: EM | Admit: 2020-10-10 | Discharge: 2020-10-16 | DRG: 480 | Disposition: A | Discharge status: Skilled Nursing Facility | Payer: PPO | Attending: Internal Medicine | Admitting: Internal Medicine

## 2020-10-10 ENCOUNTER — Encounter
Admission: EM | Disposition: A | Discharge status: Skilled Nursing Facility | Payer: Self-pay | Source: Home / Self Care | Attending: Internal Medicine

## 2020-10-10 DIAGNOSIS — S72001K Fracture of unspecified part of neck of right femur, subsequent encounter for closed fracture with nonunion: Secondary | ICD-10-CM | POA: Diagnosis not present

## 2020-10-10 DIAGNOSIS — F32A Depression, unspecified: Secondary | ICD-10-CM | POA: Diagnosis not present

## 2020-10-10 DIAGNOSIS — Z515 Encounter for palliative care: Secondary | ICD-10-CM

## 2020-10-10 DIAGNOSIS — M1990 Unspecified osteoarthritis, unspecified site: Secondary | ICD-10-CM | POA: Diagnosis present

## 2020-10-10 DIAGNOSIS — D696 Thrombocytopenia, unspecified: Secondary | ICD-10-CM | POA: Diagnosis not present

## 2020-10-10 DIAGNOSIS — S72351A Displaced comminuted fracture of shaft of right femur, initial encounter for closed fracture: Secondary | ICD-10-CM | POA: Diagnosis not present

## 2020-10-10 DIAGNOSIS — Z66 Do not resuscitate: Secondary | ICD-10-CM | POA: Diagnosis not present

## 2020-10-10 DIAGNOSIS — M25551 Pain in right hip: Secondary | ICD-10-CM | POA: Diagnosis present

## 2020-10-10 DIAGNOSIS — Z853 Personal history of malignant neoplasm of breast: Secondary | ICD-10-CM | POA: Diagnosis not present

## 2020-10-10 DIAGNOSIS — R339 Retention of urine, unspecified: Secondary | ICD-10-CM | POA: Diagnosis not present

## 2020-10-10 DIAGNOSIS — J439 Emphysema, unspecified: Secondary | ICD-10-CM | POA: Diagnosis present

## 2020-10-10 DIAGNOSIS — Y92009 Unspecified place in unspecified non-institutional (private) residence as the place of occurrence of the external cause: Secondary | ICD-10-CM | POA: Diagnosis not present

## 2020-10-10 DIAGNOSIS — W19XXXA Unspecified fall, initial encounter: Secondary | ICD-10-CM | POA: Diagnosis not present

## 2020-10-10 DIAGNOSIS — Z20822 Contact with and (suspected) exposure to covid-19: Secondary | ICD-10-CM | POA: Diagnosis not present

## 2020-10-10 DIAGNOSIS — F419 Anxiety disorder, unspecified: Secondary | ICD-10-CM | POA: Diagnosis present

## 2020-10-10 DIAGNOSIS — R1312 Dysphagia, oropharyngeal phase: Secondary | ICD-10-CM | POA: Diagnosis not present

## 2020-10-10 DIAGNOSIS — J441 Chronic obstructive pulmonary disease with (acute) exacerbation: Secondary | ICD-10-CM | POA: Diagnosis not present

## 2020-10-10 DIAGNOSIS — F338 Other recurrent depressive disorders: Secondary | ICD-10-CM | POA: Diagnosis not present

## 2020-10-10 DIAGNOSIS — R488 Other symbolic dysfunctions: Secondary | ICD-10-CM | POA: Diagnosis not present

## 2020-10-10 DIAGNOSIS — R52 Pain, unspecified: Secondary | ICD-10-CM | POA: Diagnosis not present

## 2020-10-10 DIAGNOSIS — Z888 Allergy status to other drugs, medicaments and biological substances status: Secondary | ICD-10-CM

## 2020-10-10 DIAGNOSIS — I110 Hypertensive heart disease with heart failure: Secondary | ICD-10-CM | POA: Diagnosis present

## 2020-10-10 DIAGNOSIS — R509 Fever, unspecified: Secondary | ICD-10-CM | POA: Diagnosis not present

## 2020-10-10 DIAGNOSIS — E876 Hypokalemia: Secondary | ICD-10-CM | POA: Diagnosis not present

## 2020-10-10 DIAGNOSIS — R279 Unspecified lack of coordination: Secondary | ICD-10-CM | POA: Diagnosis not present

## 2020-10-10 DIAGNOSIS — E43 Unspecified severe protein-calorie malnutrition: Secondary | ICD-10-CM | POA: Insufficient documentation

## 2020-10-10 DIAGNOSIS — J9621 Acute and chronic respiratory failure with hypoxia: Secondary | ICD-10-CM | POA: Diagnosis not present

## 2020-10-10 DIAGNOSIS — J811 Chronic pulmonary edema: Secondary | ICD-10-CM | POA: Diagnosis not present

## 2020-10-10 DIAGNOSIS — Z7189 Other specified counseling: Secondary | ICD-10-CM

## 2020-10-10 DIAGNOSIS — Z90710 Acquired absence of both cervix and uterus: Secondary | ICD-10-CM

## 2020-10-10 DIAGNOSIS — I10 Essential (primary) hypertension: Secondary | ICD-10-CM | POA: Diagnosis not present

## 2020-10-10 DIAGNOSIS — Z79899 Other long term (current) drug therapy: Secondary | ICD-10-CM

## 2020-10-10 DIAGNOSIS — S72001A Fracture of unspecified part of neck of right femur, initial encounter for closed fracture: Secondary | ICD-10-CM | POA: Diagnosis not present

## 2020-10-10 DIAGNOSIS — Z9109 Other allergy status, other than to drugs and biological substances: Secondary | ICD-10-CM

## 2020-10-10 DIAGNOSIS — R0902 Hypoxemia: Secondary | ICD-10-CM | POA: Diagnosis not present

## 2020-10-10 DIAGNOSIS — F0390 Unspecified dementia without behavioral disturbance: Secondary | ICD-10-CM | POA: Diagnosis present

## 2020-10-10 DIAGNOSIS — E222 Syndrome of inappropriate secretion of antidiuretic hormone: Secondary | ICD-10-CM | POA: Diagnosis not present

## 2020-10-10 DIAGNOSIS — D62 Acute posthemorrhagic anemia: Secondary | ICD-10-CM | POA: Diagnosis not present

## 2020-10-10 DIAGNOSIS — D649 Anemia, unspecified: Secondary | ICD-10-CM | POA: Diagnosis not present

## 2020-10-10 DIAGNOSIS — R011 Cardiac murmur, unspecified: Secondary | ICD-10-CM | POA: Diagnosis present

## 2020-10-10 DIAGNOSIS — Z7951 Long term (current) use of inhaled steroids: Secondary | ICD-10-CM

## 2020-10-10 DIAGNOSIS — S7221XA Displaced subtrochanteric fracture of right femur, initial encounter for closed fracture: Principal | ICD-10-CM | POA: Diagnosis present

## 2020-10-10 DIAGNOSIS — E860 Dehydration: Secondary | ICD-10-CM | POA: Diagnosis not present

## 2020-10-10 DIAGNOSIS — K219 Gastro-esophageal reflux disease without esophagitis: Secondary | ICD-10-CM | POA: Diagnosis present

## 2020-10-10 DIAGNOSIS — R609 Edema, unspecified: Secondary | ICD-10-CM | POA: Diagnosis not present

## 2020-10-10 DIAGNOSIS — W010XXA Fall on same level from slipping, tripping and stumbling without subsequent striking against object, initial encounter: Secondary | ICD-10-CM | POA: Diagnosis present

## 2020-10-10 DIAGNOSIS — M6281 Muscle weakness (generalized): Secondary | ICD-10-CM | POA: Diagnosis not present

## 2020-10-10 DIAGNOSIS — W19XXXD Unspecified fall, subsequent encounter: Secondary | ICD-10-CM | POA: Diagnosis not present

## 2020-10-10 DIAGNOSIS — Z803 Family history of malignant neoplasm of breast: Secondary | ICD-10-CM

## 2020-10-10 DIAGNOSIS — I5022 Chronic systolic (congestive) heart failure: Secondary | ICD-10-CM | POA: Diagnosis not present

## 2020-10-10 DIAGNOSIS — E8770 Fluid overload, unspecified: Secondary | ICD-10-CM | POA: Diagnosis not present

## 2020-10-10 DIAGNOSIS — J9601 Acute respiratory failure with hypoxia: Secondary | ICD-10-CM | POA: Diagnosis not present

## 2020-10-10 DIAGNOSIS — I959 Hypotension, unspecified: Secondary | ICD-10-CM | POA: Diagnosis not present

## 2020-10-10 DIAGNOSIS — Z885 Allergy status to narcotic agent status: Secondary | ICD-10-CM | POA: Diagnosis not present

## 2020-10-10 DIAGNOSIS — K2100 Gastro-esophageal reflux disease with esophagitis, without bleeding: Secondary | ICD-10-CM | POA: Diagnosis not present

## 2020-10-10 DIAGNOSIS — J449 Chronic obstructive pulmonary disease, unspecified: Secondary | ICD-10-CM | POA: Diagnosis not present

## 2020-10-10 DIAGNOSIS — R0602 Shortness of breath: Secondary | ICD-10-CM

## 2020-10-10 DIAGNOSIS — R000 Tachycardia, unspecified: Secondary | ICD-10-CM | POA: Diagnosis not present

## 2020-10-10 DIAGNOSIS — E871 Hypo-osmolality and hyponatremia: Secondary | ICD-10-CM | POA: Diagnosis not present

## 2020-10-10 DIAGNOSIS — R498 Other voice and resonance disorders: Secondary | ICD-10-CM | POA: Diagnosis not present

## 2020-10-10 DIAGNOSIS — J984 Other disorders of lung: Secondary | ICD-10-CM | POA: Diagnosis present

## 2020-10-10 DIAGNOSIS — I5023 Acute on chronic systolic (congestive) heart failure: Secondary | ICD-10-CM | POA: Diagnosis present

## 2020-10-10 DIAGNOSIS — R5381 Other malaise: Secondary | ICD-10-CM | POA: Diagnosis not present

## 2020-10-10 DIAGNOSIS — Z419 Encounter for procedure for purposes other than remedying health state, unspecified: Secondary | ICD-10-CM

## 2020-10-10 DIAGNOSIS — R4182 Altered mental status, unspecified: Secondary | ICD-10-CM | POA: Diagnosis not present

## 2020-10-10 DIAGNOSIS — Z882 Allergy status to sulfonamides status: Secondary | ICD-10-CM | POA: Diagnosis not present

## 2020-10-10 DIAGNOSIS — S72001D Fracture of unspecified part of neck of right femur, subsequent encounter for closed fracture with routine healing: Secondary | ICD-10-CM | POA: Diagnosis not present

## 2020-10-10 DIAGNOSIS — Z8249 Family history of ischemic heart disease and other diseases of the circulatory system: Secondary | ICD-10-CM

## 2020-10-10 DIAGNOSIS — Z9012 Acquired absence of left breast and nipple: Secondary | ICD-10-CM

## 2020-10-10 DIAGNOSIS — I509 Heart failure, unspecified: Secondary | ICD-10-CM | POA: Diagnosis not present

## 2020-10-10 DIAGNOSIS — I739 Peripheral vascular disease, unspecified: Secondary | ICD-10-CM | POA: Diagnosis present

## 2020-10-10 DIAGNOSIS — T502X5A Adverse effect of carbonic-anhydrase inhibitors, benzothiadiazides and other diuretics, initial encounter: Secondary | ICD-10-CM | POA: Diagnosis not present

## 2020-10-10 DIAGNOSIS — Z87891 Personal history of nicotine dependence: Secondary | ICD-10-CM

## 2020-10-10 DIAGNOSIS — R1310 Dysphagia, unspecified: Secondary | ICD-10-CM | POA: Diagnosis present

## 2020-10-10 DIAGNOSIS — I517 Cardiomegaly: Secondary | ICD-10-CM | POA: Diagnosis not present

## 2020-10-10 HISTORY — PX: INTRAMEDULLARY (IM) NAIL INTERTROCHANTERIC: SHX5875

## 2020-10-10 LAB — CBC WITH DIFFERENTIAL/PLATELET
Abs Immature Granulocytes: 0.04 10*3/uL (ref 0.00–0.07)
Basophils Absolute: 0 10*3/uL (ref 0.0–0.1)
Basophils Relative: 0 %
Eosinophils Absolute: 0.1 10*3/uL (ref 0.0–0.5)
Eosinophils Relative: 1 %
HCT: 31.8 % — ABNORMAL LOW (ref 36.0–46.0)
Hemoglobin: 10.7 g/dL — ABNORMAL LOW (ref 12.0–15.0)
Immature Granulocytes: 0 %
Lymphocytes Relative: 8 %
Lymphs Abs: 0.7 10*3/uL (ref 0.7–4.0)
MCH: 31.2 pg (ref 26.0–34.0)
MCHC: 33.6 g/dL (ref 30.0–36.0)
MCV: 92.7 fL (ref 80.0–100.0)
Monocytes Absolute: 0.6 10*3/uL (ref 0.1–1.0)
Monocytes Relative: 6 %
Neutro Abs: 8.4 10*3/uL — ABNORMAL HIGH (ref 1.7–7.7)
Neutrophils Relative %: 85 %
Platelets: 118 10*3/uL — ABNORMAL LOW (ref 150–400)
RBC: 3.43 MIL/uL — ABNORMAL LOW (ref 3.87–5.11)
RDW: 12.5 % (ref 11.5–15.5)
WBC: 9.9 10*3/uL (ref 4.0–10.5)
nRBC: 0 % (ref 0.0–0.2)

## 2020-10-10 LAB — RESPIRATORY PANEL BY RT PCR (FLU A&B, COVID)
Influenza A by PCR: NEGATIVE
Influenza B by PCR: NEGATIVE
SARS Coronavirus 2 by RT PCR: NEGATIVE

## 2020-10-10 LAB — BASIC METABOLIC PANEL
Anion gap: 8 (ref 5–15)
BUN: 7 mg/dL — ABNORMAL LOW (ref 8–23)
CO2: 27 mmol/L (ref 22–32)
Calcium: 7.5 mg/dL — ABNORMAL LOW (ref 8.9–10.3)
Chloride: 100 mmol/L (ref 98–111)
Creatinine, Ser: 0.44 mg/dL (ref 0.44–1.00)
GFR, Estimated: >60 mL/min (ref >60)
Glucose, Bld: 152 mg/dL — ABNORMAL HIGH (ref 70–99)
Potassium: 3.2 mmol/L — ABNORMAL LOW (ref 3.5–5.1)
Sodium: 135 mmol/L (ref 135–145)

## 2020-10-10 LAB — CK: Total CK: 75 U/L (ref 38–234)

## 2020-10-10 SURGERY — FIXATION, FRACTURE, INTERTROCHANTERIC, WITH INTRAMEDULLARY ROD
Anesthesia: Spinal | Site: Leg Upper | Laterality: Right

## 2020-10-10 MED ORDER — ADULT MULTIVITAMIN W/MINERALS CH
ORAL_TABLET | Freq: Every day | ORAL | Status: DC
  Administered 2020-10-11 – 2020-10-16 (×6): 1 via ORAL
  Filled 2020-10-10 (×6): qty 1

## 2020-10-10 MED ORDER — CEFAZOLIN SODIUM-DEXTROSE 2-4 GM/100ML-% IV SOLN
2.0000 g | INTRAVENOUS | Status: AC
  Administered 2020-10-10: 2 g via INTRAVENOUS
  Filled 2020-10-10: qty 100

## 2020-10-10 MED ORDER — PROPOFOL 500 MG/50ML IV EMUL
INTRAVENOUS | Status: AC
  Filled 2020-10-10: qty 50

## 2020-10-10 MED ORDER — ACETAMINOPHEN 500 MG PO TABS
500.0000 mg | ORAL_TABLET | Freq: Four times a day (QID) | ORAL | Status: AC
  Administered 2020-10-10 – 2020-10-11 (×2): 500 mg via ORAL
  Filled 2020-10-10 (×3): qty 1

## 2020-10-10 MED ORDER — MAGNESIUM HYDROXIDE 400 MG/5ML PO SUSP
30.0000 mL | Freq: Every day | ORAL | Status: DC | PRN
  Administered 2020-10-16: 30 mL via ORAL
  Filled 2020-10-10: qty 30

## 2020-10-10 MED ORDER — SODIUM CHLORIDE 0.9 % IV SOLN
INTRAVENOUS | Status: DC | PRN
  Administered 2020-10-10: 50 ug/min via INTRAVENOUS

## 2020-10-10 MED ORDER — KETAMINE HCL 50 MG/ML IJ SOLN
INTRAMUSCULAR | Status: DC | PRN
  Administered 2020-10-10: 25 mg via INTRAMUSCULAR

## 2020-10-10 MED ORDER — ONDANSETRON HCL 4 MG/2ML IJ SOLN
INTRAMUSCULAR | Status: AC
  Administered 2020-10-10: 4 mg via INTRAVENOUS
  Filled 2020-10-10: qty 2

## 2020-10-10 MED ORDER — CLONIDINE HCL 0.1 MG PO TABS: 0.0500 mg | ORAL_TABLET | Freq: Two times a day (BID) | ORAL | Status: DC

## 2020-10-10 MED ORDER — EPHEDRINE SULFATE 50 MG/ML IJ SOLN
INTRAMUSCULAR | Status: DC | PRN
  Administered 2020-10-10 (×3): 10 mg via INTRAVENOUS

## 2020-10-10 MED ORDER — RALOXIFENE HCL 60 MG PO TABS
60.0000 mg | ORAL_TABLET | Freq: Every day | ORAL | Status: DC
  Administered 2020-10-11 – 2020-10-16 (×6): 60 mg via ORAL
  Filled 2020-10-10 (×7): qty 1

## 2020-10-10 MED ORDER — BUPIVACAINE-EPINEPHRINE (PF) 0.5% -1:200000 IJ SOLN
INTRAMUSCULAR | Status: DC | PRN
  Administered 2020-10-10: 30 mL via PERINEURAL

## 2020-10-10 MED ORDER — ASCORBIC ACID 500 MG PO TABS
500.0000 mg | ORAL_TABLET | Freq: Every day | ORAL | Status: DC
  Administered 2020-10-11 – 2020-10-16 (×6): 500 mg via ORAL
  Filled 2020-10-10 (×6): qty 1

## 2020-10-10 MED ORDER — PHENYLEPHRINE HCL (PRESSORS) 10 MG/ML IV SOLN
INTRAVENOUS | Status: DC | PRN
  Administered 2020-10-10: 100 ug via INTRAVENOUS

## 2020-10-10 MED ORDER — PROMETHAZINE HCL 25 MG/ML IJ SOLN: 6.2500 mg | INTRAMUSCULAR | Status: DC | PRN

## 2020-10-10 MED ORDER — DIPHENHYDRAMINE HCL 12.5 MG/5ML PO ELIX: 12.5000 mg | ORAL_SOLUTION | ORAL | Status: DC | PRN

## 2020-10-10 MED ORDER — ONDANSETRON HCL 4 MG/2ML IJ SOLN: 4.0000 mg | Freq: Once | INTRAMUSCULAR | Status: AC

## 2020-10-10 MED ORDER — POTASSIUM CHLORIDE CRYS ER 20 MEQ PO TBCR
20.0000 meq | EXTENDED_RELEASE_TABLET | Freq: Two times a day (BID) | ORAL | Status: DC
  Administered 2020-10-10: 20 meq via ORAL
  Filled 2020-10-10: qty 1

## 2020-10-10 MED ORDER — ESCITALOPRAM OXALATE 10 MG PO TABS
10.0000 mg | ORAL_TABLET | Freq: Every day | ORAL | Status: DC
  Administered 2020-10-11 – 2020-10-16 (×6): 10 mg via ORAL
  Filled 2020-10-10 (×7): qty 1

## 2020-10-10 MED ORDER — UMECLIDINIUM BROMIDE 62.5 MCG/INH IN AEPB
1.0000 | INHALATION_SPRAY | Freq: Every day | RESPIRATORY_TRACT | Status: DC
  Administered 2020-10-11 – 2020-10-16 (×6): 1 via RESPIRATORY_TRACT
  Filled 2020-10-10: qty 7

## 2020-10-10 MED ORDER — UMECLIDINIUM BROMIDE 62.5 MCG/INH IN AEPB: 1.0000 | INHALATION_SPRAY | Freq: Every day | RESPIRATORY_TRACT | Status: DC

## 2020-10-10 MED ORDER — PANTOPRAZOLE SODIUM 40 MG PO TBEC
40.0000 mg | DELAYED_RELEASE_TABLET | Freq: Every day | ORAL | Status: DC
  Administered 2020-10-11 – 2020-10-16 (×6): 40 mg via ORAL
  Filled 2020-10-10 (×6): qty 1

## 2020-10-10 MED ORDER — SODIUM CHLORIDE 0.9 % IV SOLN: INTRAVENOUS | Status: DC

## 2020-10-10 MED ORDER — OXYCODONE HCL 5 MG PO TABS: 5.0000 mg | ORAL_TABLET | Freq: Once | ORAL | Status: DC | PRN

## 2020-10-10 MED ORDER — MORPHINE SULFATE (PF) 2 MG/ML IV SOLN: 0.5000 mg | INTRAVENOUS | Status: DC | PRN

## 2020-10-10 MED ORDER — ACETAMINOPHEN 325 MG PO TABS: 650.0000 mg | ORAL_TABLET | Freq: Four times a day (QID) | ORAL | Status: DC | PRN

## 2020-10-10 MED ORDER — FENTANYL CITRATE (PF) 100 MCG/2ML IJ SOLN
INTRAMUSCULAR | Status: AC
  Administered 2020-10-10: 50 ug via INTRAVENOUS
  Filled 2020-10-10: qty 2

## 2020-10-10 MED ORDER — ACETAMINOPHEN 325 MG PO TABS
325.0000 mg | ORAL_TABLET | Freq: Four times a day (QID) | ORAL | Status: DC | PRN
  Administered 2020-10-14 – 2020-10-16 (×3): 650 mg via ORAL
  Filled 2020-10-10 (×3): qty 2

## 2020-10-10 MED ORDER — ONDANSETRON HCL 4 MG PO TABS: 4.0000 mg | ORAL_TABLET | Freq: Four times a day (QID) | ORAL | Status: DC | PRN

## 2020-10-10 MED ORDER — METOCLOPRAMIDE HCL 5 MG/ML IJ SOLN: 5.0000 mg | Freq: Three times a day (TID) | INTRAMUSCULAR | Status: DC | PRN

## 2020-10-10 MED ORDER — ENOXAPARIN SODIUM 40 MG/0.4ML ~~LOC~~ SOLN
40.0000 mg | SUBCUTANEOUS | Status: DC
  Administered 2020-10-11: 40 mg via SUBCUTANEOUS
  Filled 2020-10-10: qty 0.4

## 2020-10-10 MED ORDER — FLUTICASONE FUROATE-VILANTEROL 100-25 MCG/INH IN AEPB: 1.0000 | INHALATION_SPRAY | Freq: Every day | RESPIRATORY_TRACT | Status: DC

## 2020-10-10 MED ORDER — DROPERIDOL 2.5 MG/ML IJ SOLN
0.6250 mg | Freq: Once | INTRAMUSCULAR | Status: DC | PRN
  Filled 2020-10-10: qty 2

## 2020-10-10 MED ORDER — BUPIVACAINE HCL (PF) 0.5 % IJ SOLN
INTRAMUSCULAR | Status: DC | PRN
  Administered 2020-10-10: 2.2 mL

## 2020-10-10 MED ORDER — MORPHINE SULFATE (PF) 4 MG/ML IV SOLN: 1.0000 mg | INTRAVENOUS | Status: DC | PRN

## 2020-10-10 MED ORDER — POTASSIUM CHLORIDE 20 MEQ PO PACK
40.0000 meq | PACK | Freq: Once | ORAL | Status: AC
  Administered 2020-10-10: 40 meq via ORAL
  Filled 2020-10-10: qty 2

## 2020-10-10 MED ORDER — RAMIPRIL 10 MG PO CAPS
20.0000 mg | ORAL_CAPSULE | ORAL | Status: DC
  Filled 2020-10-10: qty 2

## 2020-10-10 MED ORDER — CEFAZOLIN SODIUM-DEXTROSE 2-4 GM/100ML-% IV SOLN
2.0000 g | Freq: Four times a day (QID) | INTRAVENOUS | Status: AC
  Administered 2020-10-10 – 2020-10-11 (×3): 2 g via INTRAVENOUS
  Filled 2020-10-10 (×3): qty 100

## 2020-10-10 MED ORDER — CHOLECALCIFEROL 10 MCG (400 UNIT) PO TABS
400.0000 [IU] | ORAL_TABLET | Freq: Every day | ORAL | Status: DC
  Administered 2020-10-11 – 2020-10-16 (×6): 400 [IU] via ORAL
  Filled 2020-10-10 (×7): qty 1

## 2020-10-10 MED ORDER — CEFAZOLIN SODIUM-DEXTROSE 2-4 GM/100ML-% IV SOLN
INTRAVENOUS | Status: AC
  Filled 2020-10-10: qty 100

## 2020-10-10 MED ORDER — FUROSEMIDE 40 MG PO TABS: 40.0000 mg | ORAL_TABLET | Freq: Every day | ORAL | Status: DC

## 2020-10-10 MED ORDER — DOCUSATE SODIUM 100 MG PO CAPS
100.0000 mg | ORAL_CAPSULE | Freq: Two times a day (BID) | ORAL | Status: DC
  Administered 2020-10-10 – 2020-10-16 (×11): 100 mg via ORAL
  Filled 2020-10-10 (×12): qty 1

## 2020-10-10 MED ORDER — BUPIVACAINE-EPINEPHRINE (PF) 0.5% -1:200000 IJ SOLN
INTRAMUSCULAR | Status: AC
  Filled 2020-10-10: qty 30

## 2020-10-10 MED ORDER — METOCLOPRAMIDE HCL 10 MG PO TABS: 5.0000 mg | ORAL_TABLET | Freq: Three times a day (TID) | ORAL | Status: DC | PRN

## 2020-10-10 MED ORDER — LACTATED RINGERS IV SOLN: INTRAVENOUS | Status: DC | PRN

## 2020-10-10 MED ORDER — FUROSEMIDE 20 MG PO TABS: 20.0000 mg | ORAL_TABLET | Freq: Every day | ORAL | Status: DC | PRN

## 2020-10-10 MED ORDER — ACETAMINOPHEN 500 MG PO TABS
ORAL_TABLET | ORAL | Status: AC
  Administered 2020-10-10: 500 mg via ORAL
  Filled 2020-10-10: qty 1

## 2020-10-10 MED ORDER — ALPRAZOLAM 0.25 MG PO TABS
0.2500 mg | ORAL_TABLET | Freq: Every evening | ORAL | Status: DC | PRN
  Administered 2020-10-13 – 2020-10-14 (×2): 0.25 mg via ORAL
  Filled 2020-10-10 (×3): qty 1

## 2020-10-10 MED ORDER — ONDANSETRON HCL 4 MG/2ML IJ SOLN: 4.0000 mg | Freq: Four times a day (QID) | INTRAMUSCULAR | Status: DC | PRN

## 2020-10-10 MED ORDER — ALBUTEROL SULFATE HFA 108 (90 BASE) MCG/ACT IN AERS
2.0000 | INHALATION_SPRAY | Freq: Four times a day (QID) | RESPIRATORY_TRACT | Status: DC | PRN
  Administered 2020-10-13: 2 via RESPIRATORY_TRACT
  Filled 2020-10-10 (×2): qty 6.7

## 2020-10-10 MED ORDER — FLEET ENEMA 7-19 GM/118ML RE ENEM: 1.0000 | ENEMA | Freq: Once | RECTAL | Status: DC | PRN

## 2020-10-10 MED ORDER — MEPERIDINE HCL 50 MG/ML IJ SOLN: 6.2500 mg | INTRAMUSCULAR | Status: DC | PRN

## 2020-10-10 MED ORDER — OXYCODONE HCL 5 MG/5ML PO SOLN: 5.0000 mg | Freq: Once | ORAL | Status: DC | PRN

## 2020-10-10 MED ORDER — CALCIUM CARBONATE-VITAMIN D3 600-400 MG-UNIT PO TABS: 1.0000 | ORAL_TABLET | Freq: Every day | ORAL | Status: DC

## 2020-10-10 MED ORDER — CARVEDILOL 25 MG PO TABS: 25.0000 mg | ORAL_TABLET | Freq: Two times a day (BID) | ORAL | Status: DC

## 2020-10-10 MED ORDER — CALCIUM CARBONATE 1250 (500 CA) MG PO TABS
1.0000 | ORAL_TABLET | Freq: Every day | ORAL | Status: DC
  Administered 2020-10-11 – 2020-10-16 (×6): 500 mg via ORAL
  Filled 2020-10-10 (×7): qty 1

## 2020-10-10 MED ORDER — FLUTICASONE FUROATE-VILANTEROL 100-25 MCG/INH IN AEPB
1.0000 | INHALATION_SPRAY | Freq: Every day | RESPIRATORY_TRACT | Status: DC
  Administered 2020-10-11 – 2020-10-16 (×6): 1 via RESPIRATORY_TRACT
  Filled 2020-10-10: qty 28

## 2020-10-10 MED ORDER — POLYETHYLENE GLYCOL 3350 17 G PO PACK: 17.0000 g | PACK | Freq: Every day | ORAL | Status: DC | PRN

## 2020-10-10 MED ORDER — KETAMINE HCL 50 MG/ML IJ SOLN
INTRAMUSCULAR | Status: AC
  Filled 2020-10-10: qty 10

## 2020-10-10 MED ORDER — FLUTICASONE-UMECLIDIN-VILANT 100-62.5-25 MCG/INH IN AEPB: 1.0000 | INHALATION_SPRAY | Freq: Every day | RESPIRATORY_TRACT | Status: DC

## 2020-10-10 MED ORDER — CLONIDINE HCL 0.1 MG PO TABS
0.1000 mg | ORAL_TABLET | Freq: Two times a day (BID) | ORAL | Status: DC
  Administered 2020-10-10: 0.1 mg via ORAL
  Filled 2020-10-10: qty 1

## 2020-10-10 MED ORDER — FENTANYL CITRATE (PF) 100 MCG/2ML IJ SOLN
50.0000 ug | Freq: Once | INTRAMUSCULAR | Status: AC
  Administered 2020-10-10: 50 ug via INTRAVENOUS
  Filled 2020-10-10: qty 2

## 2020-10-10 MED ORDER — HYDROCODONE-ACETAMINOPHEN 5-325 MG PO TABS
1.0000 | ORAL_TABLET | Freq: Four times a day (QID) | ORAL | Status: DC | PRN
  Administered 2020-10-11: 2 via ORAL
  Administered 2020-10-13: 1 via ORAL
  Filled 2020-10-10: qty 2
  Filled 2020-10-10: qty 1

## 2020-10-10 MED ORDER — PROPOFOL 10 MG/ML IV BOLUS
INTRAVENOUS | Status: DC | PRN
  Administered 2020-10-10 (×2): 20 mg via INTRAVENOUS

## 2020-10-10 MED ORDER — TRAMADOL HCL 50 MG PO TABS
50.0000 mg | ORAL_TABLET | Freq: Four times a day (QID) | ORAL | Status: DC | PRN
  Administered 2020-10-11 – 2020-10-15 (×6): 50 mg via ORAL
  Filled 2020-10-10 (×6): qty 1

## 2020-10-10 MED ORDER — BISACODYL 10 MG RE SUPP
10.0000 mg | Freq: Every day | RECTAL | Status: DC | PRN
  Filled 2020-10-10: qty 1

## 2020-10-10 MED ORDER — PROPOFOL 500 MG/50ML IV EMUL
INTRAVENOUS | Status: DC | PRN
  Administered 2020-10-10: 20 ug/kg/min via INTRAVENOUS

## 2020-10-10 SURGICAL SUPPLY — 49 items
BIT DRILL 4.3MMS DISTAL GRDTED (BIT) ×1 IMPLANT
BNDG COHESIVE 4X5 TAN STRL (GAUZE/BANDAGES/DRESSINGS) ×3 IMPLANT
BNDG COHESIVE 6X5 TAN STRL LF (GAUZE/BANDAGES/DRESSINGS) ×3 IMPLANT
CANISTER SUCT 1200ML W/VALVE (MISCELLANEOUS) ×3 IMPLANT
CHLORAPREP W/TINT 26 (MISCELLANEOUS) ×3 IMPLANT
CORTICAL BONE SCR 5.0MM X 46MM (Screw) ×3 IMPLANT
CORTICAL BONE SCR 5.0MM X 48MM (Screw) ×3 IMPLANT
COVER WAND RF STERILE (DRAPES) ×3 IMPLANT
DRAPE 3/4 80X56 (DRAPES) ×3 IMPLANT
DRAPE C-ARMOR (DRAPES) ×3 IMPLANT
DRILL 4.3MMS DISTAL GRADUATED (BIT) ×3
DRSG OPSITE POSTOP 3X4 (GAUZE/BANDAGES/DRESSINGS) ×6 IMPLANT
DRSG OPSITE POSTOP 4X6 (GAUZE/BANDAGES/DRESSINGS) ×3 IMPLANT
ELECT CAUTERY BLADE 6.4 (BLADE) ×3 IMPLANT
ELECT REM PT RETURN 9FT ADLT (ELECTROSURGICAL) ×3
ELECTRODE REM PT RTRN 9FT ADLT (ELECTROSURGICAL) ×1 IMPLANT
GAUZE SPONGE 4X4 12PLY STRL (GAUZE/BANDAGES/DRESSINGS) ×3 IMPLANT
GLOVE BIO SURGEON STRL SZ8 (GLOVE) ×6 IMPLANT
GLOVE INDICATOR 8.0 STRL GRN (GLOVE) ×3 IMPLANT
GOWN STRL REUS W/ TWL LRG LVL3 (GOWN DISPOSABLE) ×1 IMPLANT
GOWN STRL REUS W/ TWL XL LVL3 (GOWN DISPOSABLE) ×1 IMPLANT
GOWN STRL REUS W/TWL LRG LVL3 (GOWN DISPOSABLE) ×2
GOWN STRL REUS W/TWL XL LVL3 (GOWN DISPOSABLE) ×2
GUIDEPIN VERSANAIL DSP 3.2X444 (ORTHOPEDIC DISPOSABLE SUPPLIES) ×3 IMPLANT
GUIDEWIRE BALL NOSE 80CM (WIRE) ×3 IMPLANT
HIP FRA NAIL LAG SCREW 10.5X90 (Orthopedic Implant) ×3 IMPLANT
MANIFOLD NEPTUNE II (INSTRUMENTS) ×3 IMPLANT
MAT ABSORB  FLUID 56X50 GRAY (MISCELLANEOUS) ×2
MAT ABSORB FLUID 56X50 GRAY (MISCELLANEOUS) ×1 IMPLANT
NAIL HIP FRACTURE 11X380MM (Nail) ×3 IMPLANT
NEEDLE FILTER BLUNT 18X 1/2SAF (NEEDLE) ×2
NEEDLE FILTER BLUNT 18X1 1/2 (NEEDLE) ×1 IMPLANT
NEEDLE HYPO 22GX1.5 SAFETY (NEEDLE) ×3 IMPLANT
NEEDLE SPNL 20GX3.5 QUINCKE YW (NEEDLE) ×3 IMPLANT
NS IRRIG 500ML POUR BTL (IV SOLUTION) ×3 IMPLANT
PACK HIP COMPR (MISCELLANEOUS) ×3 IMPLANT
SCREW CORTICL BON 5.0MM X 46MM (Screw) ×1 IMPLANT
SCREW CORTICL BON 5.0MM X 48MM (Screw) ×1 IMPLANT
SCREW LAG HIP FRA NAIL 10.5X90 (Orthopedic Implant) ×1 IMPLANT
STAPLER SKIN PROX 35W (STAPLE) ×3 IMPLANT
STRAP SAFETY 5IN WIDE (MISCELLANEOUS) ×3 IMPLANT
SUT VIC AB 0 CT1 36 (SUTURE) ×3 IMPLANT
SUT VIC AB 1 CT1 36 (SUTURE) ×3 IMPLANT
SUT VIC AB 2-0 CT1 (SUTURE) ×6 IMPLANT
SYR 10ML LL (SYRINGE) ×3 IMPLANT
SYR 30ML LL (SYRINGE) ×3 IMPLANT
SYR BULB IRRIG 60ML STRL (SYRINGE) ×3 IMPLANT
TAPE MICROFOAM 4IN (TAPE) ×3 IMPLANT
TRAY FOLEY MTR SLVR 16FR STAT (SET/KITS/TRAYS/PACK) ×3 IMPLANT

## 2020-10-10 NOTE — Op Note (Signed)
10/10/2020  5:54 PM  Patient:   Melanie Huffman  Pre-Op Diagnosis:   Closed displaced comminuted subtrochanteric fracture, right femur.  Post-Op Diagnosis:   Same  Procedure:   Reduction and internal fixation of closed displaced comminuted subtrochanteric right femur fracture with Biomet Affixis TFN nail.  Surgeon:   Pascal Lux, MD  Assistant:   None  Anesthesia:   Spinal  Findings:   As above  Complications:   None  EBL:   150 cc  Fluids:   1000 cc crystalloid  UOP:   750 cc  TT:   None  Drains:   None  Closure:   Staples  Implants:   Biomet Affixis 11 x 380 mm TFN with a 90 mm lag screw and 46 and 48 mm distal interlocking screws  Brief Clinical Note:   The patient is a 86 year old female who sustained the above-noted injury earlier today when she tripped and fell in her home. She was brought to the emergency room where x-rays demonstrated the above-noted injury. The patient has been cleared medically and presents at this time for reduction and internal fixation of the displaced right subtrochanteric femur fracture.  Procedure:   The patient was brought into the operating room. After adequate spinal anesthesia was obtained, the patient was lain in the supine position on the fracture table. The uninjured leg was placed in a flexed and abducted position while the injured lower extremity was placed in longitudinal traction. The fracture was reduced using longitudinal traction and internal rotation. In addition, a crutch was placed beneath the fracture to help support the shaft of the femur. The adequacy of reduction was verified fluoroscopically in AP and lateral projections and found to be quite satisfactory. The lateral aspects of the right hip and thigh were prepped with ChloraPrep solution before being draped sterilely. Preoperative antibiotics were administered. A timeout was performed to verify the appropriate surgical site.   The greater trochanter was identified  fluoroscopically and an approximately 5-6 cm incision made about 2-3 fingerbreadths above the tip of the greater trochanter. The incision was carried down through the subcutaneous tissues to expose the gluteal fascia. This was split the length of the incision, providing access to the tip of the trochanter. Under fluoroscopic guidance, a guidewire was drilled through the tip of the trochanter into the proximal metaphysis to the level of the lesser trochanter. After verifying its position fluoroscopically in AP and lateral projections, it was overreamed with the initial reamer to the depth of the lesser trochanter. A guidewire was passed down through the femoral canal to the supracondylar region. The adequacy of guidewire position was verified fluoroscopically in AP and lateral projections before the length of the guidewire within the canal was measured and found to be 410 mm. Therefore, a 380 mm length nail was selected. The guidewire was overreamed sequentially using the flexible reamers, beginning with a 9.5 mm reamer and progressing to a 12.5 mm reamer. This provided good cortical chatter. The 11 x 380 mm Biomet Affixis TFN rod was selected and advanced to the appropriate depth, as verified fluoroscopically.   The guide system for the lag screw was positioned and advanced through an approximately 2 cm stab incision over the lateral aspect of the proximal femur. The guidewire was drilled up through the trochanteric femoral nail and into the femoral neck to rest within 5 mm of subchondral bone. After verifying its position in the femoral neck and head in both AP and lateral projections, the guidewire  was measured and found to be optimally replicated by a 90 mm lag screw. The guidewire was overreamed to the appropriate depth before the lag screw was inserted and advanced to the appropriate depth as verified fluoroscopically in AP and lateral projections. The locking screw was advanced, then backed off a quarter  turn to set the lag screw. Again the adequacy of hardware position and fracture reduction was verified fluoroscopically in AP and lateral projections and found to be excellent.  Attention was directed distally. Given that this was an unstable subtrochanteric fracture, it was felt best to place two distal interlocking screws. Using the "perfect circle" technique, the leg and fluoroscopy machine were positioned appropriately. An approximately 1.5 cm stab incision was made over the skin at the appropriate point before the drill bit was advanced through the cortex and across the static hole of the nail. The appropriate length of the screw was determined before a 46 mm distal interlocking screw was positioned, then advanced and tightened securely. The adequacy of screw position was verified fluoroscopically in AP and lateral projections and found to be excellent.    Again, using the "perfect circle" technique, the fluoroscopy machine was positioned appropriately. An approximately 1.5 cm stab incision was made over the skin at the appropriate point before the drill bit was advanced through the cortex and across the proximal portion of the dynamic hole of the nail. The appropriate length of the screw was determined before a 48 mm distal interlocking screw was positioned, then advanced and tightened securely. The adequacy of screw position was verified fluoroscopically in AP and lateral projections and found to be excellent.  The wounds were irrigated thoroughly with sterile saline solution before the abductor fascia was reapproximated using #0 Vicryl interrupted sutures. The subcutaneous tissues of all wounds were closed using 2-0 Vicryl interrupted sutures. The skin of all wounds was closed using staples. A total of 30 cc of 0.5% Sensorcaine with epinephrine was injected in and around all incisions. Sterile occlusive dressings were applied to all wounds before the patient was transferred back to her hospital bed.  The patient was then transferred to the recovery room in satisfactory condition after tolerating the procedure well.

## 2020-10-10 NOTE — OR Nursing (Signed)
Patient is being prepared for surgery. She reports she hasn't spoken to her daughter since she fell. We called daughter Lovins and this patient was able to talk to her via ascom.

## 2020-10-10 NOTE — Anesthesia Preprocedure Evaluation (Signed)
Anesthesia Evaluation  Patient identified by MRN, date of birth, ID band Patient awake    Reviewed: Allergy & Precautions, NPO status , Patient's Chart, lab work & pertinent test results, reviewed documented beta blocker date and time   History of Anesthesia Complications Negative for: history of anesthetic complications  Airway Mallampati: II  TM Distance: >3 FB     Dental  (+) Chipped, Dental Advidsory Given   Pulmonary shortness of breath and Long-Term Oxygen Therapy, asthma , neg sleep apnea, COPD,  oxygen dependent, neg recent URI, former smoker,           Cardiovascular hypertension, Pt. on medications and Pt. on home beta blockers (-) angina+ Peripheral Vascular Disease and +CHF  (-) Past MI and (-) Cardiac Stents (-) dysrhythmias + Valvular Problems/Murmurs      Neuro/Psych PSYCHIATRIC DISORDERS Anxiety Dementia negative neurological ROS     GI/Hepatic Neg liver ROS, hiatal hernia, GERD  Controlled,  Endo/Other  negative endocrine ROS  Renal/GU Renal disease  negative genitourinary   Musculoskeletal  (+) Arthritis ,   Abdominal   Peds  Hematology negative hematology ROS (+)   Anesthesia Other Findings Denies reflux. Low sats 86-91. Subdural hematoma 5 yrs ago. No surgery needed. Breakfast at 8 am. Echo 55-60. PACs.  Reproductive/Obstetrics negative OB ROS                             Anesthesia Physical  Anesthesia Plan  ASA: IV  Anesthesia Plan: Spinal   Post-op Pain Management:    Induction: Intravenous  PONV Risk Score and Plan: 2 and Propofol infusion and TIVA  Airway Management Planned: Natural Airway and Simple Face Mask  Additional Equipment:   Intra-op Plan:   Post-operative Plan:   Informed Consent: I have reviewed the patients History and Physical, chart, labs and discussed the procedure including the risks, benefits and alternatives for the proposed  anesthesia with the patient or authorized representative who has indicated his/her understanding and acceptance.       Plan Discussed with: CRNA  Anesthesia Plan Comments:         Anesthesia Quick Evaluation

## 2020-10-10 NOTE — Anesthesia Procedure Notes (Signed)
Date/Time: 10/10/2020 4:29 PM Performed by: Nelda Marseille, CRNA Pre-anesthesia Checklist: Patient identified, Emergency Drugs available, Suction available, Patient being monitored and Timeout performed Oxygen Delivery Method: Simple face mask

## 2020-10-10 NOTE — TOC Progression Note (Signed)
Transition of Care [facility]) - Progression Note    Patient Details  Name: Melanie Huffman MRN: [id number] Date of Birth: Jul 10, 1928  Transition of Care [facility]) CM/SW [place], [place] Phone Number:  [phone] 10/10/2020, 3:50 PM  Clinical Narrative:     CSW spoke with patient's daughter Melanie Huffman, Melanie Huffman (Daughter) [phone], explained role of TOC in patient care.  Patient lives at [facility] in [place], independent living by herself.  Patient is able to perform all ADLs on her own and only uses a cane occasionally. Patient drivers herself to the doctor, hair dresser and grocery story, but will ask her daughter to assist when she is not feeling comfortable driving. Patient uses 3L of O2 at home, active w/ [employer]. Patient's PCP is Melanie Huffman, at [facility]. [facility] and [facility] are [facility]. Patient's preferred SNF is [facility] and she does not want to go to [facility].  CSW gave Ms. Has the information for Medicare.gov to research the other facilities in the area.  Expected Discharge Plan: [place] Barriers to Discharge: Continued Medical Work up, SNF Pending bed offer  Expected Discharge Plan and Services Expected Discharge Plan: [place] In-house Referral: Clinical Social Work   Post Acute Care Choice: [place] Living arrangements for the past 2 months: [facility] ([facility] in [place])                                       Social Determinants of Health ([place]) Interventions    Readmission Risk Interventions No flowsheet data found.

## 2020-10-10 NOTE — Consult Note (Signed)
ORTHOPAEDIC CONSULTATION  REQUESTING PHYSICIAN: Collier Bullock, MD  Chief Complaint:   Right hip/thigh pain.  History of Present Illness: Melanie Huffman is a 86 y.o. female with a history of congestive heart failure (ejection fraction equals 45%), COPD, early dementia, gastroesophageal reflux disease, hypertension, and anxiety who lives independently in a retirement community was in her usual state of health this morning when she apparently tripped over the edge of a rug or carpet and pitched forward, landing on her right side. She was unable to get up and so laid on the floor for several hours before she was found by a neighbor. The patient was brought to the emergency room where x-rays demonstrated a displaced subtrochanteric right hip/femur fracture. The patient has been admitted for definitive management of this injury. She denies any associated injury resulting from the fall. She did not strike her head or lose consciousness. In addition, she denies any lightheadedness, dizziness, chest pain, shortness of breath, or other symptoms which may have precipitated her fall.  Past Medical History:  Diagnosis Date  . Anxiety   . Arthritis 2019   mostly in neck  . Asthma   . Breast cancer ([place]) 1991   mastectomy left  . Cancer ([place]) 1991   L Breast  . CHF (congestive heart failure) ([place])   . COPD (chronic obstructive pulmonary disease) ([place])    hypoxia with resp failure admission in 03/2018  . Dementia ([facility])    confusion beginning  . Emphysema of lung ([place])   . GERD (gastroesophageal reflux disease)   . Heart murmur   . History of hiatal hernia   . Hypertension 03/2018   recent hypertension urgency ..admitted in april 2019  . Hypoxia 03/2018   O2 saturation runs 86-91% on Room Air.  . Intermittent dysphagia   . Peripheral vascular disease ([place])   . Shortness of breath dyspnea   . Traumatic subdural hematoma [facility])     Past Surgical History:  Procedure Laterality Date  . ABDOMINAL HYSTERECTOMY     Partial  . APPENDECTOMY    . BREAST BIOPSY Right    1980 and 1970's  . BREAST SURGERY  1991   L Mastectomy  . EYE SURGERY Bilateral    cataract extraction  . MASTECTOMY    . ORIF PATELLA Left 04/12/2015   Procedure: OPEN REDUCTION INTERNAL (ORIF) FIXATION PATELLA;  Surgeon: Hessie Knows, MD;  Location: [facility] ORS;  Service: Orthopedics;  Laterality: Left;  . ORIF WRIST FRACTURE Right 05/04/2018   Procedure: OPEN REDUCTION INTERNAL FIXATION (ORIF) WRIST FRACTURE;  Surgeon: Hessie Knows, MD;  Location: [facility] ORS;  Service: Orthopedics;  Laterality: Right;  . PERIPHERAL VASCULAR CATHETERIZATION Left 04/17/2015   Procedure: Lower Extremity Angiography;  Surgeon: Katha Cabal, MD;  Location: [facility];  Service: Cardiovascular;  Laterality: Left;  . PERIPHERAL VASCULAR CATHETERIZATION  04/17/2015   Procedure: Lower Extremity Intervention;  Surgeon: Katha Cabal, MD;  Location: [facility];  Service: Cardiovascular;;  . TONSILLECTOMY     Social History   Socioeconomic History  . Marital status: Widowed    Spouse name: Not on file  . Number of children: 1  . Years of education: 45  . Highest education level: Associate degree: occupational, Hotel manager, or vocational program  Occupational History  . Occupation: retired  Tobacco Use  . Smoking status: Former Smoker    Packs/day: 0.50    Years: 20.00    Pack years: 10.00    Types: Cigarettes  Quit date: 01/26/1979    Years since quitting: 41.7  . Smokeless tobacco: Never Used  Vaping Use  . Vaping Use: Never used  Substance and Sexual Activity  . Alcohol use: No  . Drug use: No  . Sexual activity: Not Currently  Other Topics Concern  . Not on file  Social History Narrative  . Not on file   Social Determinants of Health   Financial Resource Strain:   . Difficulty of Paying Living Expenses: Not on file  Food  Insecurity:   . Worried About Charity fundraiser in the Last Year: Not on file  . Ran Out of Food in the Last Year: Not on file  Transportation Needs:   . Lack of Transportation (Medical): Not on file  . Lack of Transportation (Non-Medical): Not on file  Physical Activity:   . Days of Exercise per Week: Not on file  . Minutes of Exercise per Session: Not on file  Stress:   . Feeling of Stress : Not on file  Social Connections:   . Frequency of Communication with Friends and Family: Not on file  . Frequency of Social Gatherings with Friends and Family: Not on file  . Attends Religious Services: Not on file  . Active Member of Clubs or Organizations: Not on file  . Attends Archivist Meetings: Not on file  . Marital Status: Not on file   Family History  Problem Relation Age of Onset  . CVA Mother   . Kidney disease Mother   . Heart disease Father   . Kidney disease Cousin 29  . Breast cancer Cousin   . Kidney disease Cousin 11  . Breast cancer Cousin    Allergies  Allergen Reactions  . Amlodipine Swelling     [Onset: 08/26/2013]feet and neck begin to swell  . Adhesive [Tape] Other (See Comments)    Skin is very thin and tears easily. Please use paper tape only  . Codeine Nausea Only  . Percocet [Oxycodone-Acetaminophen] Nausea And Vomiting    Patient took on an empty stomach. Good relief but unsettled. Still has not eaten today  . Sulfa Antibiotics Nausea Only and Rash    rash   Prior to Admission medications   Medication Sig Start Date End Date Taking? Authorizing Provider  ALPRAZolam (XANAX) 0.25 MG tablet Take 0.25 mg by mouth at bedtime as needed. 08/28/20  Yes [provider]  Calcium Carbonate-Vitamin D3 (CALCIUM 600/VITAMIN D) 600-400 MG-UNIT TABS Take 1 tablet by mouth daily.   Yes [provider]  carvedilol (COREG) 25 MG tablet Take 25 mg by mouth 2 (two) times daily with a meal.   Yes [provider]  cloNIDine (CATAPRES) 0.1  MG tablet Take 0.1 mg by mouth 2 (two) times daily.  07/20/18  Yes [provider]  escitalopram (LEXAPRO) 10 MG tablet Take 10 mg by mouth daily.   Yes [provider]  furosemide (LASIX) 40 MG tablet Take 40 mg by mouth daily. And additional 20mg  PRN    Yes [provider]  Multiple Vitamins-Minerals (CENTRUM SILVER PO) Take 1 tablet by mouth daily.   Yes [provider]  omeprazole (PRILOSEC) 20 MG capsule Take 20 mg by mouth daily.    Yes [provider]  potassium chloride SA (K-DUR,KLOR-CON) 20 MEQ tablet Take 1 tablet (20 mEq total) by mouth daily. Patient taking differently: Take 20 mEq by mouth 2 (two) times daily.  03/09/18  Yes Saundra Shelling, MD  raloxifene (EVISTA) 60 MG tablet Take 60 mg by mouth daily.   Yes [provider]  ramipril (ALTACE) 10 MG capsule Take 20 mg by mouth every morning.   Yes [provider]  TRELEGY ELLIPTA 100-62.5-25 MCG/INH AEPB Inhale 1 puff into the lungs daily. 09/19/20  Yes [provider]  vitamin C (ASCORBIC ACID) 500 MG tablet Take 500 mg by mouth daily.   Yes [provider]  acetaminophen (TYLENOL) 325 MG tablet Take 2 tablets (650 mg total) by mouth every 6 (six) hours as needed for mild pain (or Fever >/= 101). 06/09/18   Loletha Grayer, MD  albuterol (PROVENTIL HFA;VENTOLIN HFA) 108 (90 Base) MCG/ACT inhaler Inhale 2 puffs into the lungs every 6 (six) hours as needed for wheezing or shortness of breath.    [provider]  hydrALAZINE (APRESOLINE) 25 MG tablet Take 25 mg by mouth as directed. 09/19/20   [provider]  Psyllium (METAMUCIL FIBER PO) Take 5 capsules by mouth 3 (three) times daily as needed.     [provider]   DG Chest 1 View  Result Date: 10/10/2020 CLINICAL DATA:  Status post trip and fall this morning. EXAM: CHEST  1 VIEW COMPARISON:  CT chest and single view of the chest 12/07/2018. FINDINGS: The lungs are emphysematous  but clear. Cardiomegaly. Aortic atherosclerosis. No pneumothorax or pleural effusion. No acute or focal bony abnormality. IMPRESSION: No acute disease. Cardiomegaly. Aortic Atherosclerosis (ICD10-I70.0) and Emphysema (ICD10-J43.9). Electronically Signed   By: Inge Rise M.D.   On: 10/10/2020 13:25   DG Hip Unilat W or Wo Pelvis 2-3 Views Right  Result Date: 10/10/2020 CLINICAL DATA:  Fall, right hip pain EXAM: DG HIP (WITH OR WITHOUT PELVIS) 2-3V RIGHT COMPARISON:  None. FINDINGS: Acute comminuted and displaced fracture of the proximal right femur. Fracture involves the lesser trochanter and subtrochanteric aspects of the femur. There is greater than 1 shaft with of medial displacement. Anterior apex angulation. Hip joint is intact without dislocation. Pelvic bony ring appears intact. Large volume of stool within the rectum. IMPRESSION: 1. Acute comminuted and displaced fracture of the proximal right femur as described. 2. Large volume of stool within the rectum. Electronically Signed   By: Davina Poke D.O.   On: 10/10/2020 13:27    Positive ROS: All other systems have been reviewed and were otherwise negative with the exception of those mentioned in the HPI and as above.  Physical Exam: General:  Alert, no acute distress Psychiatric:  Patient is competent for consent with normal mood and affect   Cardiovascular:  No pedal edema Respiratory:  No wheezing, non-labored breathing GI:  Abdomen is soft and non-tender Skin:  No lesions in the area of chief complaint Neurologic:  Sensation intact distally Lymphatic:  No axillary or cervical lymphadenopathy  Orthopedic Exam:  Orthopedic examination is limited to the right hip and lower extremity. The right lower extremity is clearly shortened and externally rotated as compared to the left. Skin inspection around the right hip is notable for some swelling, but otherwise is unremarkable. No erythema, ecchymosis, abrasions, or other skin  abnormalities are identified. She is able dorsiflex and plantarflex her toes and ankle. Sensation is intact to light touch to all distributions. She has good capillary refill to her right foot.  X-rays:  X-rays of the pelvis and right femur are available for review and have been reviewed by myself. These films demonstrate a comminuted subtrochanteric right femur fracture with a large butterfly piece. The  hip joint itself appears to be well preserved and without evidence for significant degenerative changes. No lytic lesions or other acute bony abnormalities are identified.  Assessment: Closed comminuted displaced right subtrochanteric femur fracture.  Plan: The treatment options, including both surgical and nonsurgical choices, have been discussed in detail with the patient. The patient would like to proceed with surgical intervention, specifically an intertrochanteric nailing of the displaced subtrochanteric right femur fracture. The risks (including bleeding, infection, nerve and/or blood vessel injury, persistent or recurrent pain, malunion and/or nonunion, loosening or failure of the hardware, leg length inequality, need for further surgery, blood clots, strokes, heart attacks or arrhythmias, pneumonia, etc.) and benefits of the surgical procedure were discussed. The patient states her understanding and agrees to proceed. She agrees to a blood transfusion if necessary. A formal written consent will be obtained by the nursing staff.  Thank you for asking me to participate in the care of this delightful yet unfortunate woman. I will be happy to follow her with you.   Pascal Lux, MD  Beeper #:  [phone]  10/10/2020 2:53 PM

## 2020-10-10 NOTE — Transfer of Care (Signed)
Immediate Anesthesia Transfer of Care Note  Patient: Elwanda [place]  Procedure(s) Performed: INTRAMEDULLARY (IM) NAIL INTERTROCHANTRIC (Right Leg Upper)  Patient Location: PACU  Anesthesia Type:Spinal  Level of Consciousness: awake, alert  and oriented  Airway & Oxygen Therapy: Patient Spontanous Breathing and Patient connected to face mask oxygen  Post-op Assessment: Report given to RN and Post -op Vital signs reviewed and stable  Post vital signs: Reviewed and stable  Last Vitals:  Vitals Value Taken Time  BP    Temp    Pulse 67 10/10/20 1752  Resp 12 10/10/20 1752  SpO2 96 % 10/10/20 1752  Vitals shown include unvalidated device data.  Last Pain:  Vitals:   10/10/20 1501  TempSrc: Tympanic  PainSc: 3          Complications: No complications documented.

## 2020-10-10 NOTE — ED Provider Notes (Signed)
[facility] Emergency Department Provider Note  ____________________________________________  Time seen: Approximately 1:41 PM  I have reviewed the triage vital signs and the nursing notes.   HISTORY  Chief Complaint Fall and Leg Injury    HPI Melanie Huffman is a 86 y.o. female with a history of CHF COPD  who comes the ED complaining of  right hip pain after a trip and fall at home.  She was unable to get up and laid on the floor for about 4 hours before neighbors came over and found her.  Denies head injury or neck pain, no loss of consciousness, no complaints other than the right hip pain.  Pain is nonradiating worse with movement severe no alleviating factors.     Past Medical History:  Diagnosis Date  . Anxiety   . Arthritis 2019   mostly in neck  . Asthma   . Breast cancer ([place]) 1991   mastectomy left  . Cancer ([place]) 1991   L Breast  . CHF (congestive heart failure) ([place])   . COPD (chronic obstructive pulmonary disease) ([place])    hypoxia with resp failure admission in 03/2018  . Dementia ([place])    confusion beginning  . Emphysema of lung ([place])   . GERD (gastroesophageal reflux disease)   . Heart murmur   . History of hiatal hernia   . Hypertension 03/2018   recent hypertension urgency ..admitted in april 2019  . Hypoxia 03/2018   O2 saturation runs 86-91% on Room Air.  . Intermittent dysphagia   . Peripheral vascular disease ([place])   . Shortness of breath dyspnea   . Traumatic subdural hematoma [facility])      Patient Active Problem List   Diagnosis Date Noted  . Decubitus ulcer of left ankle, stage 1 12/08/2018  . Acute respiratory failure ([place]) 12/07/2018  . Chronic systolic heart failure ([place]) 08/06/2018  . Syncope 06/04/2018  . Accelerated hypertension 03/07/2018  . Acute systolic CHF (congestive heart failure) ([place]) 03/07/2018  . Mass of upper lobe of left lung 11/09/2016  . HTN (hypertension) 10/31/2016  . PVD (peripheral vascular  disease) ([place]) 10/31/2016  . COPD (chronic obstructive pulmonary disease) ([place]) 10/31/2016  . GERD (gastroesophageal reflux disease) 10/31/2016  . Hyponatremia 07/10/2016  . Hypokalemia 07/10/2016  . Cellulitis of leg, left 07/09/2016  . Subdural hematoma ([place]) 07/26/2014     Past Surgical History:  Procedure Laterality Date  . ABDOMINAL HYSTERECTOMY     Partial  . APPENDECTOMY    . BREAST BIOPSY Right    1980 and 1970's  . BREAST SURGERY  1991   L Mastectomy  . EYE SURGERY Bilateral    cataract extraction  . MASTECTOMY    . ORIF PATELLA Left 04/12/2015   Procedure: OPEN REDUCTION INTERNAL (ORIF) FIXATION PATELLA;  Surgeon: Hessie Knows, MD;  Location: [facility] ORS;  Service: Orthopedics;  Laterality: Left;  . ORIF WRIST FRACTURE Right 05/04/2018   Procedure: OPEN REDUCTION INTERNAL FIXATION (ORIF) WRIST FRACTURE;  Surgeon: Hessie Knows, MD;  Location: [facility] ORS;  Service: Orthopedics;  Laterality: Right;  . PERIPHERAL VASCULAR CATHETERIZATION Left 04/17/2015   Procedure: Lower Extremity Angiography;  Surgeon: Katha Cabal, MD;  Location: [place] CV LAB;  Service: Cardiovascular;  Laterality: Left;  . PERIPHERAL VASCULAR CATHETERIZATION  04/17/2015   Procedure: Lower Extremity Intervention;  Surgeon: Katha Cabal, MD;  Location: [place] CV LAB;  Service: Cardiovascular;;  . TONSILLECTOMY       Prior to Admission medications  Medication Sig Start Date End Date Taking? Authorizing Provider  acetaminophen (TYLENOL) 325 MG tablet Take 2 tablets (650 mg total) by mouth every 6 (six) hours as needed for mild pain (or Fever >/= 101). 06/09/18   Loletha Grayer, MD  albuterol (PROVENTIL HFA;VENTOLIN HFA) 108 (90 Base) MCG/ACT inhaler Inhale 2 puffs into the lungs every 6 (six) hours as needed for wheezing or shortness of breath.    [provider]  Calcium Carbonate-Vitamin D3 (CALCIUM 600/VITAMIN D) 600-400 MG-UNIT TABS Take 1 tablet by mouth daily.    [provider]  carvedilol (COREG) 25 MG tablet Take 25 mg by mouth 2 (two) times daily with a meal.    [provider]  cloNIDine (CATAPRES) 0.1 MG tablet Take 0.05 mg by mouth 2 (two) times daily.  07/20/18   [provider]  escitalopram (LEXAPRO) 10 MG tablet Take 10 mg by mouth daily.    [provider]  fluticasone furoate-vilanterol (BREO ELLIPTA) 100-25 MCG/INH AEPB Inhale 1 puff into the lungs daily.    [provider]  furosemide (LASIX) 40 MG tablet Take 40 mg by mouth daily. And additional 20mg  PRN     [provider]  guaiFENesin (ROBITUSSIN) 100 MG/5ML SOLN Take 5 mLs (100 mg total) by mouth every 6 (six) hours as needed for cough or to loosen phlegm. Patient not taking: Reported on 04/12/2019 12/10/18   Demetrios Loll, MD  Multiple Vitamins-Minerals (CENTRUM SILVER PO) Take 1 tablet by mouth daily.    [provider]  omeprazole (PRILOSEC) 20 MG capsule Take 20 mg by mouth daily.     [provider]  potassium chloride SA (K-DUR,KLOR-CON) 20 MEQ tablet Take 1 tablet (20 mEq total) by mouth daily. Patient taking differently: Take 20 mEq by mouth 2 (two) times daily.  03/09/18   Saundra Shelling, MD  predniSONE (DELTASONE) 20 MG tablet Take 2 tablets (40 mg total) by mouth daily with breakfast. Patient not taking: Reported on 04/12/2019 12/10/18   Demetrios Loll, MD  Psyllium (METAMUCIL FIBER PO) Take 5 capsules by mouth 3 (three) times daily as needed.     [provider]  raloxifene (EVISTA) 60 MG tablet Take 60 mg by mouth daily.    [provider]  ramipril (ALTACE) 10 MG capsule Take 20 mg by mouth every morning.    [provider]  tiotropium (SPIRIVA) 18 MCG inhalation capsule Place 18 mcg into inhaler and inhale daily.     [provider]  vitamin C (ASCORBIC ACID) 500 MG tablet Take 500 mg by mouth daily.    [provider]     Allergies Amlodipine, Adhesive [tape], Codeine,  Percocet [oxycodone-acetaminophen], and Sulfa antibiotics   Family History  Problem Relation Age of Onset  . CVA Mother   . Kidney disease Mother   . Heart disease Father   . Kidney disease Cousin 25  . Breast cancer Cousin   . Kidney disease Cousin 54  . Breast cancer Cousin     Social History Social History   Tobacco Use  . Smoking status: Former Smoker    Packs/day: 0.50    Years: 20.00    Pack years: 10.00    Types: Cigarettes    Quit date: 01/26/1979    Years since quitting: 41.7  . Smokeless tobacco: Never Used  Vaping Use  . Vaping Use: Never used  Substance Use Topics  . Alcohol use: No  . Drug use: No    Review of  Systems  Constitutional:   No fever or chills.  ENT:   No sore throat. No rhinorrhea. Cardiovascular:   No chest pain or syncope. Respiratory:   No dyspnea or cough. Gastrointestinal:   Negative for abdominal pain, vomiting and diarrhea.  Musculoskeletal: Right thigh pain as above All other systems reviewed and are negative except as documented above in ROS and HPI.  ____________________________________________   PHYSICAL EXAM:  VITAL SIGNS: ED Triage Vitals  Enc Vitals Group     BP 10/10/20 1231 (!) 171/83     Pulse Rate 10/10/20 1231 61     Resp 10/10/20 1231 16     Temp 10/10/20 1231 98 F (36.7 C)     Temp Source 10/10/20 1231 Oral     SpO2 10/10/20 1231 99 %     Weight 10/10/20 1233 113 lb (51.3 kg)     Height 10/10/20 1233 5\' 4"  (1.626 m)     Head Circumference --      Peak Flow --      Pain Score 10/10/20 1232 0     Pain Loc --      Pain Edu? --      Excl. in [place]? --     Vital signs reviewed, nursing assessments reviewed.   Constitutional:   Alert and oriented. Non-toxic appearance. Eyes:   Conjunctivae are normal. EOMI. PERRL. ENT      Head:   Normocephalic and atraumatic.      Nose:   Wearing a mask.      Mouth/Throat:   Wearing a mask.      Neck:   No meningismus. Full ROM.  No midline spinal  tenderness Hematological/Lymphatic/Immunilogical:   No cervical lymphadenopathy. Cardiovascular:   RRR. Symmetric bilateral radial and DP pulses.  No murmurs. Cap refill less than 2 seconds. Respiratory:   Normal respiratory effort without tachypnea/retractions. Breath sounds are clear and equal bilaterally. No wheezes/rales/rhonchi. Gastrointestinal:   Soft and nontender. Non distended. There is no CVA tenderness.  No rebound, rigidity, or guarding.  Musculoskeletal: No significant tenderness over the pelvis, but there is pronounced tenderness over the mid thigh with swelling.   Compartments are soft.  No distal paleness or coolness or loss of sensation.  No distal pain Neurologic:   Normal speech and language.  Motor grossly intact. No acute focal neurologic deficits are appreciated.  Skin:    Skin is warm, dry and intact. No rash noted.  No petechiae, purpura, or bullae.  ____________________________________________    LABS (pertinent positives/negatives) (all labs ordered are listed, but only abnormal results are displayed) Labs Reviewed  CBC WITH DIFFERENTIAL/PLATELET - Abnormal; Notable for the following components:      Result Value   RBC 3.43 (*)    Hemoglobin 10.7 (*)    HCT 31.8 (*)    Platelets 118 (*)    Neutro Abs 8.4 (*)    All other components within normal limits  RESPIRATORY PANEL BY RT PCR (FLU A&B, COVID)  BASIC METABOLIC PANEL  CK   ____________________________________________   EKG  Interpreted by me Normal sinus rhythm rate of 64, normal axis and intervals.  Normal QRS ST segments and T waves.  No acute ischemic changes.  ____________________________________________    RADIOLOGY  DG Chest 1 View  Result Date: 10/10/2020 CLINICAL DATA:  Status post trip and fall this morning. EXAM: CHEST  1 VIEW COMPARISON:  CT chest and single view of the chest 12/07/2018. FINDINGS: The lungs are emphysematous but clear. Cardiomegaly. Aortic  atherosclerosis. No  pneumothorax or pleural effusion. No acute or focal bony abnormality. IMPRESSION: No acute disease. Cardiomegaly. Aortic Atherosclerosis (ICD10-I70.0) and Emphysema (ICD10-J43.9). Electronically Signed   By: Inge Rise M.D.   On: 10/10/2020 13:25   DG Hip Unilat W or Wo Pelvis 2-3 Views Right  Result Date: 10/10/2020 CLINICAL DATA:  Fall, right hip pain EXAM: DG HIP (WITH OR WITHOUT PELVIS) 2-3V RIGHT COMPARISON:  None. FINDINGS: Acute comminuted and displaced fracture of the proximal right femur. Fracture involves the lesser trochanter and subtrochanteric aspects of the femur. There is greater than 1 shaft with of medial displacement. Anterior apex angulation. Hip joint is intact without dislocation. Pelvic bony ring appears intact. Large volume of stool within the rectum. IMPRESSION: 1. Acute comminuted and displaced fracture of the proximal right femur as described. 2. Large volume of stool within the rectum. Electronically Signed   By: Davina Poke D.O.   On: 10/10/2020 13:27    ____________________________________________   PROCEDURES Procedures  ____________________________________________  DIFFERENTIAL DIAGNOSIS   Hip fracture, contusion, hematoma, dehydration, rhabdomyolysis  CLINICAL IMPRESSION / ASSESSMENT AND PLAN / ED COURSE  Medications ordered in the ED: Medications  fentaNYL (SUBLIMAZE) injection 50 mcg (50 mcg Intravenous Given 10/10/20 1251)    Pertinent labs & imaging results that were available during my care of the patient were reviewed by me and considered in my medical decision making (see chart for details).  Melanie Huffman was evaluated in Emergency Department on 10/10/2020 for the symptoms described in the history of present illness. She was evaluated in the context of the global COVID-19 pandemic, which necessitated consideration that the patient might be at risk for infection with the SARS-CoV-2 virus that causes COVID-19. Institutional protocols and  algorithms that pertain to the evaluation of patients at risk for COVID-19 are in a state of rapid change based on information released by regulatory bodies including the CDC and federal and state organizations. These policies and algorithms were followed during the patient's care in the ED.   Patient presents after mechanical fall with right thigh pain, concerning for fracture.  X-rays obtained and images viewed by me which do show oblique displaced fracture of the right proximal femur.  Radiology report agrees.  Vital signs are unremarkable, lab panel unremarkable with a normal CK level and creatinine.  Case discussed with Dr. Roland Rack who plans for surgery this afternoon.  Covid screening in the lab.  Hospitalist consult requested.  Clinical Course as of Oct 11 1415  Wed Oct 10, 2020  1358 Case d/w Dr. Francine Graven who will eval pt. She advises 63meq potassium PO for K 3.2.  Will give k-vescent to minimize gastric irritation with impending procedure.    [PS]    Clinical Course User Index [PS] Carrie Mew, MD     ____________________________________________   FINAL CLINICAL IMPRESSION(S) / ED DIAGNOSES    Final diagnoses:  Fracture, proximal femur, right, closed, initial encounter ([place])  Chronic systolic CHF (congestive heart failure) ([place])  Chronic obstructive pulmonary disease, unspecified COPD type [facility])     ED Discharge Orders    None      Portions of this note were generated with dragon dictation software. Dictation errors may occur despite best attempts at proofreading.   Carrie Mew, MD 10/10/20 1416

## 2020-10-10 NOTE — ED Notes (Signed)
Patient transported to X-ray 

## 2020-10-10 NOTE — ED Triage Notes (Addendum)
Pt reports she fell this morning about 7:30am; states she tripped over something. Right leg injured, unable to get up; lay in place on the floor x4 hrs when neighbors came over for lunch and found her. Denies dizziness or LOC. Right upper leg is notably swollen

## 2020-10-10 NOTE — Anesthesia Procedure Notes (Addendum)
Spinal  Patient location during procedure: OR Start time: 10/10/2020 3:50 PM End time: 10/10/2020 3:59 PM Staffing Performed: resident/CRNA  Resident/CRNA: Nelda Marseille, CRNA Preanesthetic Checklist Completed: patient identified, IV checked, site marked, risks and benefits discussed, surgical consent, monitors and equipment checked, pre-op evaluation and timeout performed Spinal Block Patient position: sitting Prep: Betadine Patient monitoring: heart rate, continuous pulse ox, blood pressure and cardiac monitor Approach: midline Location: L3-4 Injection technique: single-shot Needle Needle type: [place] and Introducer  Needle gauge: 25 G Needle length: 9 cm Additional Notes Negative paresthesia. Negative blood return. Positive free-flowing CSF. Expiration date of kit checked and confirmed. Patient tolerated procedure well, without complications.

## 2020-10-10 NOTE — H&P (Addendum)
History and Physical    Melanie Huffman PNT:[id number] DOB: 04-10-1928 DOA: 10/10/2020  PCP: Dion Body, MD   Patient coming from: Home  I have personally briefly reviewed patient's old medical records in [place]  Chief Complaint: Right leg pain  HPI: Melanie Huffman is a 86 y.o. female with medical history significant for COPD, CHF, hypertension, anxiety disorder who was brought into the emergency room by EMS for evaluation of a fall.  Per patient she fell around 7:30 AM this morning, she describes it as a mechanical fall and had tripped over something.  She developed sudden pain in her right upper thigh and was unable to get up and so she lay on the floor for about 4 hours until her neighbors came to the appointment and found her on the floor and called EMS.  She denied feeling dizzy or lightheaded and denied having any loss of consciousness. She denies having any chest pain, no shortness of breath, no nausea, no vomiting, no palpitations, no diaphoresis, no abdominal pain, no changes in her bowel habits or any urinary symptoms. Labs show sodium 135, potassium 3.2, chloride 100, bicarb 27, glucose 152, BUN 7, creatinine 0.4, calcium 7.5, total CK 75, white count 9.9, hemoglobin 10.7, hematocrit 31.8, MCV 92.7, RDW 12.5, platelet count 118 Respiratory viral panel is negative Chest x-ray reviewed by me shows hyperinflated lung fields.  No acute disease. Right femur x-ray shows acute comminuted and displaced fracture of the proximal right femur as described. 2. Large volume of stool within the rectum. Twelve-lead EKG reviewed by me shows sinus rhythm with nonspecific T wave changes    ED Course: Patient is an 86 year old Caucasian female who was brought into the emergency room EMS for evaluation after she was found on the floor by her neighbors.  Patient had tripped and fell around 7:30 AM this morning and was unable to get up due to severe pain in her right lower extremity.  She  was on the floor for about 4 hours until she was found by her neighbors who called EMS.  X-ray of the right hip shows an acute comminuted and displaced fracture of the proximal right femur.  Orthopedic surgery has been consulted and surgery is planned for 10/10/20.   Review of Systems: As per HPI otherwise 10 point review of systems negative.    Past Medical History:  Diagnosis Date  . Anxiety   . Arthritis 2019   mostly in neck  . Asthma   . Breast cancer ([place]) 1991   mastectomy left  . Cancer ([place]) 1991   L Breast  . CHF (congestive heart failure) ([place])   . COPD (chronic obstructive pulmonary disease) ([place])    hypoxia with resp failure admission in 03/2018  . Dementia ([place])    confusion beginning  . Emphysema of lung ([place])   . GERD (gastroesophageal reflux disease)   . Heart murmur   . History of hiatal hernia   . Hypertension 03/2018   recent hypertension urgency ..admitted in april 2019  . Hypoxia 03/2018   O2 saturation runs 86-91% on Room Air.  . Intermittent dysphagia   . Peripheral vascular disease ([place])   . Shortness of breath dyspnea   . Traumatic subdural hematoma [facility])     Past Surgical History:  Procedure Laterality Date  . ABDOMINAL HYSTERECTOMY     Partial  . APPENDECTOMY    . BREAST BIOPSY Right    1980 and 1970's  . BREAST SURGERY  1991   L Mastectomy  . EYE SURGERY Bilateral    cataract extraction  . MASTECTOMY    . ORIF PATELLA Left 04/12/2015   Procedure: OPEN REDUCTION INTERNAL (ORIF) FIXATION PATELLA;  Surgeon: Hessie Knows, MD;  Location: [facility] ORS;  Service: Orthopedics;  Laterality: Left;  . ORIF WRIST FRACTURE Right 05/04/2018   Procedure: OPEN REDUCTION INTERNAL FIXATION (ORIF) WRIST FRACTURE;  Surgeon: Hessie Knows, MD;  Location: [facility] ORS;  Service: Orthopedics;  Laterality: Right;  . PERIPHERAL VASCULAR CATHETERIZATION Left 04/17/2015   Procedure: Lower Extremity Angiography;  Surgeon: Katha Cabal, MD;  Location: [place] [facility];   Service: Cardiovascular;  Laterality: Left;  . PERIPHERAL VASCULAR CATHETERIZATION  04/17/2015   Procedure: Lower Extremity Intervention;  Surgeon: Katha Cabal, MD;  Location: [place] [facility];  Service: Cardiovascular;;  . TONSILLECTOMY       reports that she quit smoking about 41 years ago. Her smoking use included cigarettes. She has a 10.00 pack-year smoking history. She has never used smokeless tobacco. She reports that she does not drink alcohol and does not use drugs.  Allergies  Allergen Reactions  . Amlodipine Swelling     [Onset: 08/26/2013]feet and neck begin to swell  . Adhesive [Tape] Other (See Comments)    Skin is very thin and tears easily. Please use paper tape only  . Codeine Nausea Only  . Percocet [Oxycodone-Acetaminophen] Nausea And Vomiting    Patient took on an empty stomach. Good relief but unsettled. Still has not eaten today  . Sulfa Antibiotics Nausea Only and Rash    rash    Family History  Problem Relation Age of Onset  . CVA Mother   . Kidney disease Mother   . Heart disease Father   . Kidney disease Cousin 78  . Breast cancer Cousin   . Kidney disease Cousin 90  . Breast cancer Cousin      Prior to Admission medications   Medication Sig Start Date End Date Taking? Authorizing Provider  acetaminophen (TYLENOL) 325 MG tablet Take 2 tablets (650 mg total) by mouth every 6 (six) hours as needed for mild pain (or Fever >/= 101). 06/09/18   Loletha Grayer, MD  albuterol (PROVENTIL HFA;VENTOLIN HFA) 108 (90 Base) MCG/ACT inhaler Inhale 2 puffs into the lungs every 6 (six) hours as needed for wheezing or shortness of breath.    [provider]  Calcium Carbonate-Vitamin D3 (CALCIUM 600/VITAMIN D) 600-400 MG-UNIT TABS Take 1 tablet by mouth daily.    [provider]  carvedilol (COREG) 25 MG tablet Take 25 mg by mouth 2 (two) times daily with a meal.    [provider]  cloNIDine (CATAPRES) 0.1 MG tablet Take 0.05 mg by  mouth 2 (two) times daily.  07/20/18   [provider]  escitalopram (LEXAPRO) 10 MG tablet Take 10 mg by mouth daily.    [provider]  fluticasone furoate-vilanterol (BREO ELLIPTA) 100-25 MCG/INH AEPB Inhale 1 puff into the lungs daily.    [provider]  furosemide (LASIX) 40 MG tablet Take 40 mg by mouth daily. And additional 20mg  PRN     [provider]  guaiFENesin (ROBITUSSIN) 100 MG/5ML SOLN Take 5 mLs (100 mg total) by mouth every 6 (six) hours as needed for cough or to loosen phlegm. Patient not taking: Reported on 04/12/2019 12/10/18   Demetrios Loll, MD  Multiple Vitamins-Minerals (CENTRUM SILVER PO) Take 1 tablet by mouth daily.    [provider]  omeprazole (PRILOSEC) 20 MG capsule Take 20 mg by mouth daily.     [provider]  potassium chloride SA (K-DUR,KLOR-CON) 20 MEQ tablet Take 1 tablet (20 mEq total) by mouth daily. Patient taking differently: Take 20 mEq by mouth 2 (two) times daily.  03/09/18   Saundra Shelling, MD  predniSONE (DELTASONE) 20 MG tablet Take 2 tablets (40 mg total) by mouth daily with breakfast. Patient not taking: Reported on 04/12/2019 12/10/18   Demetrios Loll, MD  Psyllium (METAMUCIL FIBER PO) Take 5 capsules by mouth 3 (three) times daily as needed.     [provider]  raloxifene (EVISTA) 60 MG tablet Take 60 mg by mouth daily.    [provider]  ramipril (ALTACE) 10 MG capsule Take 20 mg by mouth every morning.    [provider]  tiotropium (SPIRIVA) 18 MCG inhalation capsule Place 18 mcg into inhaler and inhale daily.     [provider]  vitamin C (ASCORBIC ACID) 500 MG tablet Take 500 mg by mouth daily.    [provider]    Physical Exam: Vitals:   10/10/20 1233 10/10/20 1330 10/10/20 1400 10/10/20 1501  BP:  137/83 (!) 161/82 117/68  Pulse:  62 63 67  Resp:  14  16  Temp:    (!) 96.3 F (35.7 C)  TempSrc:    Tympanic  SpO2:  100% 100% 100%  Weight:  51.3 kg   51.3 kg  Height: 5\' 4"  (1.626 m)   5\' 4"  (1.626 m)     Vitals:   10/10/20 1233 10/10/20 1330 10/10/20 1400 10/10/20 1501  BP:  137/83 (!) 161/82 117/68  Pulse:  62 63 67  Resp:  14  16  Temp:    (!) 96.3 F (35.7 C)  TempSrc:    Tympanic  SpO2:  100% 100% 100%  Weight: 51.3 kg   51.3 kg  Height: 5\' 4"  (1.626 m)   5\' 4"  (1.626 m)    Constitutional: NAD, alert and oriented x 3.  Appears to be in pain Eyes: PERRL, lids and conjunctivae normal ENMT: Mucous membranes are moist.  Neck: normal, supple, no masses, no thyromegaly Respiratory: clear to auscultation bilaterally, no wheezing, no crackles. Normal respiratory effort. No accessory muscle use.  Cardiovascular: Regular rate and rhythm, no murmurs / rubs / gallops. No extremity edema. 2+ pedal pulses. No carotid bruits.  Abdomen: no tenderness, no masses palpated. No hepatosplenomegaly. Bowel sounds positive.  Musculoskeletal: no clubbing / cyanosis.  Significant swelling involving the right thigh Skin: no rashes, lesions, ulcers.  Neurologic: No gross focal neurologic deficit. Psychiatric: Normal mood and affect.   Labs on Admission: I have personally reviewed following labs and imaging studies  CBC: Recent Labs  Lab 10/10/20 1245  WBC 9.9  NEUTROABS 8.4*  HGB 10.7*  HCT 31.8*  MCV 92.7  PLT 122*   Basic Metabolic Panel: Recent Labs  Lab 10/10/20 1245  NA 135  K 3.2*  CL 100  CO2 27  GLUCOSE 152*  BUN 7*  CREATININE 0.44  CALCIUM 7.5*   GFR: Estimated Creatinine Clearance: 37.9 mL/min (by C-G formula based on SCr of 0.44 mg/dL). Liver Function Tests: No results for input(s): AST, ALT, ALKPHOS, BILITOT, PROT, ALBUMIN in the last 168 hours. No results for input(s): LIPASE, AMYLASE in the last 168 hours. No results for input(s): AMMONIA in the last 168 hours. Coagulation Profile: No results for input(s): INR, PROTIME in the last 168 hours. Cardiac Enzymes: Recent  Labs  Lab 10/10/20 1245   CKTOTAL 75   BNP (last 3 results) No results for input(s): PROBNP in the last 8760 hours. HbA1C: No results for input(s): HGBA1C in the last 72 hours. CBG: No results for input(s): GLUCAP in the last 168 hours. Lipid Profile: No results for input(s): CHOL, HDL, LDLCALC, TRIG, CHOLHDL, LDLDIRECT in the last 72 hours. Thyroid Function Tests: No results for input(s): TSH, T4TOTAL, FREET4, T3FREE, THYROIDAB in the last 72 hours. Anemia Panel: No results for input(s): VITAMINB12, FOLATE, FERRITIN, TIBC, IRON, RETICCTPCT in the last 72 hours. Urine analysis:    Component Value Date/Time   COLORURINE YELLOW (A) 12/07/2018 1132   APPEARANCEUR CLEAR (A) 12/07/2018 1132   APPEARANCEUR Clear 07/26/2014 0034   LABSPEC 1.034 (H) 12/07/2018 1132   LABSPEC 1.010 07/26/2014 0034   PHURINE 6.0 12/07/2018 1132   GLUCOSEU NEGATIVE 12/07/2018 1132   GLUCOSEU Negative 07/26/2014 0034   HGBUR NEGATIVE 12/07/2018 1132   BILIRUBINUR NEGATIVE 12/07/2018 1132   BILIRUBINUR Negative 07/26/2014 0034   KETONESUR 20 (A) 12/07/2018 1132   PROTEINUR NEGATIVE 12/07/2018 1132   NITRITE NEGATIVE 12/07/2018 1132   LEUKOCYTESUR NEGATIVE 12/07/2018 1132   LEUKOCYTESUR Trace 07/26/2014 0034    Radiological Exams on Admission: DG Chest 1 View  Result Date: 10/10/2020 CLINICAL DATA:  Status post trip and fall this morning. EXAM: CHEST  1 VIEW COMPARISON:  CT chest and single view of the chest 12/07/2018. FINDINGS: The lungs are emphysematous but clear. Cardiomegaly. Aortic atherosclerosis. No pneumothorax or pleural effusion. No acute or focal bony abnormality. IMPRESSION: No acute disease. Cardiomegaly. Aortic Atherosclerosis (ICD10-I70.0) and Emphysema (ICD10-J43.9). Electronically Signed   By: Inge Rise M.D.   On: 10/10/2020 13:25   DG Hip Unilat W or Wo Pelvis 2-3 Views Right  Result Date: 10/10/2020 CLINICAL DATA:  Fall, right hip pain EXAM: DG HIP (WITH OR WITHOUT PELVIS) 2-3V RIGHT COMPARISON:   None. FINDINGS: Acute comminuted and displaced fracture of the proximal right femur. Fracture involves the lesser trochanter and subtrochanteric aspects of the femur. There is greater than 1 shaft with of medial displacement. Anterior apex angulation. Hip joint is intact without dislocation. Pelvic bony ring appears intact. Large volume of stool within the rectum. IMPRESSION: 1. Acute comminuted and displaced fracture of the proximal right femur as described. 2. Large volume of stool within the rectum. Electronically Signed   By: Davina Poke D.O.   On: 10/10/2020 13:27    EKG: Independently reviewed.  Sinus rhythm with nonspecific T wave changes  Assessment/Plan Principal Problem:   Fracture of femoral neck, right, closed (HCC) Active Problems:   Hypokalemia   HTN (hypertension)   Fall   Chronic systolic CHF (congestive heart failure) (HCC)   COPD (chronic obstructive pulmonary disease) with emphysema (HCC)   Thrombocytopenia (HCC)    Fracture of femoral neck Status post fall Immobilize right lower extremity Pain control Orthopedic surgery has been consulted and surgical repair is planned for 10/10/20   Hypertension Continue carvedilol and clonidine   COPD with chronic respiratory failure Not acutely exacerbated Patient is on 3L of oxygen continuous Continue as needed bronchodilator therapy as well as inhaled steroids   Depression  Continue Lexapro    Chronic systolic heart failure Last known LVEF of 45% Continue carvedilol and furosemide   Hypokalemia Secondary to diuretic use Supplement potassium    Thrombocytopenia Chronic Monitor closely for bleeding   DVT prophylaxis: SCD Code Status: DO NOT RESUSCITATE Family Communication: Greater than 50% of time was  spent discussing care with patient at the bedside.  CODE STATUS was discussed and she request to be placed on a DO NOT RESUSCITATE status Disposition Plan: Rehab Consults called: Orthopedic  surgery    Theodosia Bahena MD [facility]     10/10/2020, 4:34 PM

## 2020-10-11 ENCOUNTER — Encounter: Payer: Self-pay | Admitting: Surgery

## 2020-10-11 DIAGNOSIS — J439 Emphysema, unspecified: Secondary | ICD-10-CM

## 2020-10-11 DIAGNOSIS — D649 Anemia, unspecified: Secondary | ICD-10-CM

## 2020-10-11 DIAGNOSIS — I10 Essential (primary) hypertension: Secondary | ICD-10-CM

## 2020-10-11 DIAGNOSIS — D696 Thrombocytopenia, unspecified: Secondary | ICD-10-CM

## 2020-10-11 DIAGNOSIS — E876 Hypokalemia: Secondary | ICD-10-CM

## 2020-10-11 LAB — CBC
HCT: 21.5 % — ABNORMAL LOW (ref 36.0–46.0)
Hemoglobin: 6.9 g/dL — ABNORMAL LOW (ref 12.0–15.0)
MCH: 31.1 pg (ref 26.0–34.0)
MCHC: 32.1 g/dL (ref 30.0–36.0)
MCV: 96.8 fL (ref 80.0–100.0)
Platelets: 93 10*3/uL — ABNORMAL LOW (ref 150–400)
RBC: 2.22 MIL/uL — ABNORMAL LOW (ref 3.87–5.11)
RDW: 12.8 % (ref 11.5–15.5)
WBC: 8.6 10*3/uL (ref 4.0–10.5)
nRBC: 0 % (ref 0.0–0.2)

## 2020-10-11 LAB — BASIC METABOLIC PANEL
Anion gap: 7 (ref 5–15)
BUN: 14 mg/dL (ref 8–23)
CO2: 29 mmol/L (ref 22–32)
Calcium: 8.3 mg/dL — ABNORMAL LOW (ref 8.9–10.3)
Chloride: 95 mmol/L — ABNORMAL LOW (ref 98–111)
Creatinine, Ser: 0.95 mg/dL (ref 0.44–1.00)
GFR, Estimated: 57 mL/min — ABNORMAL LOW (ref >60)
Glucose, Bld: 134 mg/dL — ABNORMAL HIGH (ref 70–99)
Potassium: 5 mmol/L (ref 3.5–5.1)
Sodium: 131 mmol/L — ABNORMAL LOW (ref 135–145)

## 2020-10-11 LAB — ABO/RH: ABO/RH(D): O NEG

## 2020-10-11 LAB — HEMOGLOBIN: Hemoglobin: 7.9 g/dL — ABNORMAL LOW (ref 12.0–15.0)

## 2020-10-11 LAB — PREPARE RBC (CROSSMATCH)

## 2020-10-11 MED ORDER — SODIUM CHLORIDE 0.9% IV SOLUTION: Freq: Once | INTRAVENOUS | Status: AC

## 2020-10-11 MED ORDER — SODIUM CHLORIDE 0.9 % IV SOLN: INTRAVENOUS | Status: DC

## 2020-10-11 MED ORDER — ENSURE ENLIVE PO LIQD
237.0000 mL | Freq: Two times a day (BID) | ORAL | Status: DC
  Administered 2020-10-11 – 2020-10-16 (×11): 237 mL via ORAL

## 2020-10-11 MED ORDER — ACETAMINOPHEN 325 MG PO TABS
650.0000 mg | ORAL_TABLET | Freq: Once | ORAL | Status: AC
  Administered 2020-10-11: 650 mg via ORAL
  Filled 2020-10-11: qty 2

## 2020-10-11 NOTE — Progress Notes (Signed)
MD notified of pt not voiding post foley removal Bladder scan  Volume 200. Received orders to increase IVF to 100/hr and in and out cath when volume is 40-0 ml or greater

## 2020-10-11 NOTE — Progress Notes (Signed)
Subjective: 1 Day Post-Op Procedure(s) (LRB): INTRAMEDULLARY (IM) NAIL INTERTROCHANTRIC (Right) Patient reports pain as mild.   Patient is well, and has had no acute complaints or problems PT and care management to assist with discharge planning. Negative for chest pain and shortness of breath Fever: no Gastrointestinal:Negative for nausea and vomiting.   BP has been running low today.    Objective: Vital signs in last 24 hours: Temp:  [96.3 F (35.7 C)-98.5 F (36.9 C)] 98.2 F (36.8 C) (11/11 1222) Pulse Rate:  [57-79] 75 (11/11 1222) Resp:  [12-20] 18 (11/11 1222) BP: (79-138)/(43-88) 83/46 (11/11 1222) SpO2:  [90 %-100 %] 100 % (11/11 1222) Weight:  [51.3 kg] 51.3 kg (11/10 1501)  Intake/Output from previous day:  Intake/Output Summary (Last 24 hours) at 10/11/2020 1410 Last data filed at 10/11/2020 0500 Gross per 24 hour  Intake 2449.55 ml  Output 900 ml  Net 1549.55 ml    Intake/Output this shift: No intake/output data recorded.  Labs: Recent Labs    10/10/20 1245 10/11/20 0602  HGB 10.7* 6.9*   Recent Labs    10/10/20 1245 10/11/20 0602  WBC 9.9 8.6  RBC 3.43* 2.22*  HCT 31.8* 21.5*  PLT 118* 93*   Recent Labs    10/10/20 1245 10/11/20 0602  NA 135 131*  K 3.2* 5.0  CL 100 95*  CO2 27 29  BUN 7* 14  CREATININE 0.44 0.95  GLUCOSE 152* 134*  CALCIUM 7.5* 8.3*   No results for input(s): LABPT, INR in the last 72 hours.   EXAM General - Patient is Alert and Appropriate Extremity - ABD soft Sensation intact distally Intact pulses distally Dorsiflexion/Plantar flexion intact Incision: scant drainage No cellulitis present Dressing/Incision - blood tinged drainage to the proximal incision site. Motor Function - intact, moving foot and toes well on exam.  Abdomen soft with normal bowel sounds.  Past Medical History:  Diagnosis Date  . Anxiety   . Arthritis 2019   mostly in neck  . Asthma   . Breast cancer ([place]) 1991   mastectomy  left  . Cancer ([place]) 1991   L Breast  . CHF (congestive heart failure) ([place])   . COPD (chronic obstructive pulmonary disease) ([place])    hypoxia with resp failure admission in 03/2018  . Dementia ([place])    confusion beginning  . Emphysema of lung ([place])   . GERD (gastroesophageal reflux disease)   . Heart murmur   . History of hiatal hernia   . Hypertension 03/2018   recent hypertension urgency ..admitted in april 2019  . Hypoxia 03/2018   O2 saturation runs 86-91% on Room Air.  . Intermittent dysphagia   . Peripheral vascular disease ([place])   . Shortness of breath dyspnea   . Traumatic subdural hematoma (HCC)    Assessment/Plan: 1 Day Post-Op Procedure(s) (LRB): INTRAMEDULLARY (IM) NAIL INTERTROCHANTRIC (Right) Principal Problem:   Fracture of femoral neck, right, closed (HCC) Active Problems:   Hypokalemia   HTN (hypertension)   Fall   Chronic systolic CHF (congestive heart failure) (HCC)   COPD (chronic obstructive pulmonary disease) with emphysema (HCC)   Thrombocytopenia (HCC)  Estimated body mass index is 19.41 kg/m as calculated from the following:   Height as of this encounter: 5\' 4"  (1.626 m).   Weight as of this encounter: 51.3 kg. Advance diet Up with therapy D/C IV fluids when tolerating po intake.  Labs reviewed today, Hg 6.9, underwent transfusion of 1 unit of PRBC today. Up with therapy  today, will need SNF upon discharge. Begin working on BM.  DVT Prophylaxis - Lovenox, Foot Pumps and TED hose Weight-Bearing as tolerated to right leg  J. Cameron Proud, PA-C [facility] 10/11/2020, 2:10 PM

## 2020-10-11 NOTE — Anesthesia Postprocedure Evaluation (Signed)
Anesthesia Post Note  Patient: Melanie Huffman  Procedure(s) Performed: INTRAMEDULLARY (IM) NAIL INTERTROCHANTRIC (Right Leg Upper)  Patient location during evaluation: Nursing Unit Anesthesia Type: Spinal Level of consciousness: awake, awake and alert and oriented Pain management: pain level controlled Vital Signs Assessment: post-procedure vital signs reviewed and stable Respiratory status: spontaneous breathing, nonlabored ventilation and respiratory function stable Cardiovascular status: stable Postop Assessment: no headache, no backache, adequate PO intake, no apparent nausea or vomiting and patient able to bend at knees Anesthetic complications: no   No complications documented.   Last Vitals:  Vitals:   10/11/20 0514 10/11/20 0738  BP: (!) 98/43 (!) 84/43  Pulse: 79 69  Resp:  17  Temp: 36.5 C 36.9 C  SpO2: 90% 100%    Last Pain:  Vitals:   10/11/20 0514  TempSrc: Oral  PainSc:                  Ricki Miller

## 2020-10-11 NOTE — TOC Progression Note (Signed)
Transition of Care [facility]) - Progression Note    Patient Details  Name: Melanie Huffman MRN: [id number] Date of Birth: Oct 06, 1928  [facility] CM/SW [place], RN Phone Number: 10/11/2020, 1:29 PM  Clinical Narrative:   RNCM met with patient and daughter at bedside to further discuss recommendations for skilled nursing placement. Patient resting in bed sleeping off and on but will respond when spoken to. Daughter Melanie Huffman relays that they understand that it is recommended for patient to go into rehab once she leave the hospital and relays that her 2 top choices would be [facility]. Discussed that [facility] is no longer open to the public and that this CM can send bed request to [facility] but that recently they have been full. She is agreeable for offer to be sent to [facility] as well.  Per her request Medicare.gov rating system of facilities in the area was left in the room and this CM will follow back up for any further choices.  RNCM verified PASSR and completed [place]-2 and sent bed requests to [facility] and [facility]    Expected Discharge Plan: [place] Barriers to Discharge: Continued Medical Work up, SNF Pending bed offer  Expected Discharge Plan and Services Expected Discharge Plan: [place] In-house Referral: Clinical Social Work   Post Acute Care Choice: [place] Living arrangements for the past 2 months: [place] ([place])                                       Social Determinants of Health ([place]) Interventions    Readmission Risk Interventions No flowsheet data found.

## 2020-10-11 NOTE — NC FL2 (Signed)
[name] LEVEL OF CARE SCREENING TOOL     IDENTIFICATION  Patient Name: Melanie Huffman Birthdate: 08-07-1928 Sex: female Admission Date (Current Location): 10/10/2020  [facility] and [place] Number:  Engineering geologist and Address:  [facility], [address], [place], [place] [address]      Provider Number: [id number]  Attending Physician Name and Address:  Nolberto Hanlon, MD  Relative Name and Phone Number:  [name] [phone]    Current Level of Care: Hospital Recommended Level of Care: [place] Prior Approval Number:    Date Approved/Denied:   PASRR Number: [id number]  Discharge Plan: SNF    Current Diagnoses: Patient Active Problem List   Diagnosis Date Noted   Fracture of femoral neck, right, closed ([place]) 10/10/2020   Fall 92/42/6834   Chronic systolic CHF (congestive heart failure) ([place]) 10/10/2020   COPD (chronic obstructive pulmonary disease) with emphysema ([place]) 10/10/2020   Thrombocytopenia ([place]) 10/10/2020   Decubitus ulcer of left ankle, stage 1 12/08/2018   Acute respiratory failure ([place]) 19/62/2297   Chronic systolic heart failure ([place]) 08/06/2018   Syncope 06/04/2018   Accelerated hypertension 98/92/1194   Acute systolic CHF (congestive heart failure) ([place]) 03/07/2018   Mass of upper lobe of left lung 11/09/2016   HTN (hypertension) 10/31/2016   PVD (peripheral vascular disease) ([place]) 10/31/2016   COPD (chronic obstructive pulmonary disease) ([place]) 10/31/2016   GERD (gastroesophageal reflux disease) 10/31/2016   Hyponatremia 07/10/2016   Hypokalemia 07/10/2016   Cellulitis of leg, left 07/09/2016   Subdural hematoma (HCC) 07/26/2014    Orientation RESPIRATION BLADDER Height & Weight     Self, Time, Situation, Place  Normal External catheter Weight: 51.3 kg Height:  5\' 4"  (162.6 cm)  BEHAVIORAL SYMPTOMS/MOOD NEUROLOGICAL BOWEL NUTRITION STATUS      Continent  Diet (Dysphagia 3 with thin liquids)  AMBULATORY STATUS COMMUNICATION OF NEEDS Skin   Extensive Assist Verbally Surgical wounds                       Personal Care Assistance Level of Assistance  Dressing, Feeding, Bathing Bathing Assistance: Maximum assistance Feeding assistance: Limited assistance Dressing Assistance: Maximum assistance     Functional Limitations Info  Sight, Hearing, Speech Sight Info: Adequate Hearing Info: Adequate Speech Info: Adequate    SPECIAL CARE FACTORS FREQUENCY  PT (By licensed PT), OT (By licensed OT)                    Contractures Contractures Info: Not present    Additional Factors Info  Code Status, Allergies Code Status Info: DNR Allergies Info: Amlodipine, Adhesive, Codeine, Percocet, Sulfa           Current Medications (10/11/2020):  This is the current hospital active medication list Current Facility-Administered Medications  Medication Dose Route Frequency Provider Last Rate Last Admin   0.9 %  sodium chloride infusion (Manually program via Guardrails IV Fluids)   Intravenous Once Nolberto Hanlon, MD       0.9 %  sodium chloride infusion   Intravenous Continuous Poggi, Marshall Cork, MD 75 mL/hr at 10/11/20 0500 Rate Verify at 10/11/20 0500   acetaminophen (TYLENOL) tablet 325-650 mg  325-650 mg Oral Q6H PRN Poggi, Marshall Cork, MD       acetaminophen (TYLENOL) tablet 500 mg  500 mg Oral Q6H Poggi, Marshall Cork, MD   500 mg at 10/11/20 1047   acetaminophen (TYLENOL) tablet 650 mg  650 mg  Oral Once Nolberto Hanlon, MD       albuterol (VENTOLIN HFA) 108 (90 Base) MCG/ACT inhaler 2 puff  2 puff Inhalation Q6H PRN Poggi, Marshall Cork, MD       ALPRAZolam Duanne Moron) tablet 0.25 mg  0.25 mg Oral QHS PRN Poggi, Marshall Cork, MD       ascorbic acid (VITAMIN C) tablet 500 mg  500 mg Oral Daily Poggi, Marshall Cork, MD   500 mg at 10/11/20 1049   bisacodyl (DULCOLAX) suppository 10 mg  10 mg Rectal Daily PRN Poggi, Marshall Cork, MD       calcium carbonate (OS-CAL - dosed  in mg of elemental calcium) tablet 500 mg of elemental calcium  1 tablet Oral Q breakfast Poggi, Marshall Cork, MD   500 mg of elemental calcium at 10/11/20 1049   And   cholecalciferol (VITAMIN D3) tablet 400 Units  400 Units Oral Daily Corky Mull, MD   400 Units at 10/11/20 1047   diphenhydrAMINE (BENADRYL) 12.5 MG/5ML elixir 12.5-25 mg  12.5-25 mg Oral Q4H PRN Poggi, Marshall Cork, MD       docusate sodium (COLACE) capsule 100 mg  100 mg Oral BID Poggi, Marshall Cork, MD   100 mg at 10/11/20 1048   enoxaparin (LOVENOX) injection 40 mg  40 mg Subcutaneous Q24H Poggi, Marshall Cork, MD   40 mg at 10/11/20 0909   escitalopram (LEXAPRO) tablet 10 mg  10 mg Oral Daily Poggi, Marshall Cork, MD   10 mg at 10/11/20 1051   feeding supplement (ENSURE ENLIVE / ENSURE PLUS) liquid 237 mL  237 mL Oral BID BM Nolberto Hanlon, MD       umeclidinium bromide (INCRUSE ELLIPTA) 62.5 MCG/INH 1 puff  1 puff Inhalation Daily Poggi, Marshall Cork, MD   1 puff at 10/11/20 1059   And   fluticasone furoate-vilanterol (BREO ELLIPTA) 100-25 MCG/INH 1 puff  1 puff Inhalation Daily Poggi, Marshall Cork, MD   1 puff at 10/11/20 1100   HYDROcodone-acetaminophen (NORCO/VICODIN) 5-325 MG per tablet 1-2 tablet  1-2 tablet Oral Q6H PRN Poggi, Marshall Cork, MD   2 tablet at 10/11/20 0309   magnesium hydroxide (MILK OF MAGNESIA) suspension 30 mL  30 mL Oral Daily PRN Poggi, Marshall Cork, MD       metoCLOPramide (REGLAN) tablet 5-10 mg  5-10 mg Oral Q8H PRN Poggi, Marshall Cork, MD       Or   metoCLOPramide (REGLAN) injection 5-10 mg  5-10 mg Intravenous Q8H PRN Poggi, Marshall Cork, MD       morphine 2 MG/ML injection 0.5 mg  0.5 mg Intravenous Q2H PRN Poggi, Marshall Cork, MD       multivitamin with minerals tablet   Oral Daily Poggi, Marshall Cork, MD   1 tablet at 10/11/20 1048   ondansetron (ZOFRAN) tablet 4 mg  4 mg Oral Q6H PRN Poggi, Marshall Cork, MD       Or   ondansetron (ZOFRAN) injection 4 mg  4 mg Intravenous Q6H PRN Poggi, Marshall Cork, MD       pantoprazole (PROTONIX) EC tablet 40 mg  40 mg Oral  Daily Poggi, Marshall Cork, MD   40 mg at 10/11/20 1049   raloxifene (EVISTA) tablet 60 mg  60 mg Oral Daily Poggi, Marshall Cork, MD   60 mg at 10/11/20 1051   sodium phosphate (FLEET) 7-19 GM/118ML enema 1 enema  1 enema Rectal Once PRN Poggi, Marshall Cork, MD       traMADol (  ULTRAM) tablet 50 mg  50 mg Oral Q6H PRN Poggi, Marshall Cork, MD   50 mg at 10/11/20 2370     Discharge Medications: Please see discharge summary for a list of discharge medications.  Relevant Imaging Results:  Relevant Lab Results:   Additional Information SSN [id number]  [name], RN

## 2020-10-11 NOTE — TOC Progression Note (Signed)
Transition of Care [facility]) - Progression Note    Patient Details  Name: DAWSON HOLLMAN MRN: [id number] Date of Birth: March 23, 1928  [facility]) CM/SW [place], RN Phone Number: 10/11/2020, 3:24 PM  Clinical Narrative:   RNCM met with daughter outside of room, she reports that she has been in touch with [facility] [place] a facility that is closer to her home in [place]. She reports that it is her wish for information to be sent down there to assess if they can offer placement for her mother. RNCM reached out to facility at [phone] and spoke with front desk. Fax number of [phone] was provided and referral information was sent for review. Shortly after that patient's daughter received phone call from [place] at facility and relayed that information had been faxed. She reported that she would look at referral including patient's insurance and would reach back out to this CM tomorrow.     Expected Discharge Plan: [place] Barriers to Discharge: Continued Medical Work up, SNF Pending bed offer  Expected Discharge Plan and Services Expected Discharge Plan: [place] In-house Referral: Clinical Social Work   Post Acute Care Choice: [place] Living arrangements for the past 2 months: [place])                                       Social Determinants of Health ([place]) Interventions    Readmission Risk Interventions No flowsheet data found.

## 2020-10-11 NOTE — Progress Notes (Signed)
PROGRESS NOTE    DRISHTI PEPPERMAN  IRW:[id number] DOB: [birth year] DOA: 10/10/2020 PCP: Dion Body, MD    Brief Narrative:  MARIELLEN Huffman is a 86 y.o. female with medical history significant for COPD, CHF, hypertension, anxiety disorder who was brought into the emergency room by EMS for evaluation of a fall  11/11-she underwent ORIF by Dr. Roland Rack on 11/10 Hemoglobin 6.9 this a.m.  Consultants:   orthopedics  Procedures:   Antimicrobials:       Subjective: Daughter at bedside.  Patient is sleepy this a.m.  BP low.  Objective: Vitals:   10/11/20 0009 10/11/20 0418 10/11/20 0514 10/11/20 0738  BP: (!) 90/45 (!) 79/43 (!) 98/43 (!) 84/43  Pulse: 65 71 79 69  Resp: 19 20  17   Temp: 97.9 F (36.6 C) 98.2 F (36.8 C) 97.7 F (36.5 C) 98.5 F (36.9 C)  TempSrc: Oral Oral Oral   SpO2: 99% 94% 90% 100%  Weight:      Height:        Intake/Output Summary (Last 24 hours) at 10/11/2020 0840 Last data filed at 10/11/2020 0500 Gross per 24 hour  Intake 2449.55 ml  Output 900 ml  Net 1549.55 ml   Filed Weights   10/10/20 1233 10/10/20 1501  Weight: 51.3 kg 51.3 kg    Examination:  General exam: Appears calm and comfortable, mildly pale, sleepy Respiratory system: Clear to auscultation. Respiratory effort normal. Cardiovascular system: S1 & S2 heard, RRR. No JVD, murmurs, rubs, gallops or clicks.  Gastrointestinal system: Abdomen is nondistended, soft and nontender.  Normal bowel sounds heard. Central nervous system: Awake but falls asleep, grossly intact by Extremities: No edema Skin: Warm dry Psychiatry: Mood & affect appropriate in current setting.     Data Reviewed: I have personally reviewed following labs and imaging studies  CBC: Recent Labs  Lab 10/10/20 1245 10/11/20 0602  WBC 9.9 8.6  NEUTROABS 8.4*  --   HGB 10.7* 6.9*  HCT 31.8* 21.5*  MCV 92.7 96.8  PLT 118* 93*   Basic Metabolic Panel: Recent Labs  Lab 10/10/20 1245 10/11/20 0602   NA 135 131*  K 3.2* 5.0  CL 100 95*  CO2 27 29  GLUCOSE 152* 134*  BUN 7* 14  CREATININE 0.44 0.95  CALCIUM 7.5* 8.3*   GFR: Estimated Creatinine Clearance: 31.9 mL/min (by C-G formula based on SCr of 0.95 mg/dL). Liver Function Tests: No results for input(s): AST, ALT, ALKPHOS, BILITOT, PROT, ALBUMIN in the last 168 hours. No results for input(s): LIPASE, AMYLASE in the last 168 hours. No results for input(s): AMMONIA in the last 168 hours. Coagulation Profile: No results for input(s): INR, PROTIME in the last 168 hours. Cardiac Enzymes: Recent Labs  Lab 10/10/20 1245  CKTOTAL 75   BNP (last 3 results) No results for input(s): PROBNP in the last 8760 hours. HbA1C: No results for input(s): HGBA1C in the last 72 hours. CBG: No results for input(s): GLUCAP in the last 168 hours. Lipid Profile: No results for input(s): CHOL, HDL, LDLCALC, TRIG, CHOLHDL, LDLDIRECT in the last 72 hours. Thyroid Function Tests: No results for input(s): TSH, T4TOTAL, FREET4, T3FREE, THYROIDAB in the last 72 hours. Anemia Panel: No results for input(s): VITAMINB12, FOLATE, FERRITIN, TIBC, IRON, RETICCTPCT in the last 72 hours. Sepsis Labs: No results for input(s): PROCALCITON, LATICACIDVEN in the last 168 hours.  Recent Results (from the past 240 hour(s))  Respiratory Panel by RT PCR (Flu A&B, Covid) - Nasopharyngeal Swab  Status: None   Collection Time: 10/10/20  1:38 PM   Specimen: Nasopharyngeal Swab  Result Value Ref Range Status   SARS Coronavirus 2 by RT PCR NEGATIVE NEGATIVE Final    Comment: (NOTE) SARS-CoV-2 target nucleic acids are NOT DETECTED.  The SARS-CoV-2 RNA is generally detectable in upper respiratoy specimens during the acute phase of infection. The lowest concentration of SARS-CoV-2 viral copies this assay can detect is 131 copies/mL. A negative result does not preclude SARS-Cov-2 infection and should not be used as the sole basis for treatment or other patient  management decisions. A negative result may occur with  improper specimen collection/handling, submission of specimen other than nasopharyngeal swab, presence of viral mutation(s) within the areas targeted by this assay, and inadequate number of viral copies (<131 copies/mL). A negative result must be combined with clinical observations, patient history, and epidemiological information. The expected result is Negative.  Fact Sheet for Patients:  PinkCheek.be  Fact Sheet for Healthcare Providers:  GravelBags.it  This test is no t yet approved or cleared by the [place] FDA and  has been authorized for detection and/or diagnosis of SARS-CoV-2 by FDA under an Emergency Use Authorization (EUA). This EUA will remain  in effect (meaning this test can be used) for the duration of the COVID-19 declaration under Section 564(b)(1) of the Act, 21 U.S.C. section 360bbb-3(b)(1), unless the authorization is terminated or revoked sooner.     Influenza A by PCR NEGATIVE NEGATIVE Final   Influenza B by PCR NEGATIVE NEGATIVE Final    Comment: (NOTE) The Xpert Xpress SARS-CoV-2/FLU/RSV assay is intended as an aid in  the diagnosis of influenza from Nasopharyngeal swab specimens and  should not be used as a sole basis for treatment. Nasal washings and  aspirates are unacceptable for Xpert Xpress SARS-CoV-2/FLU/RSV  testing.  Fact Sheet for Patients: PinkCheek.be  Fact Sheet for Healthcare Providers: GravelBags.it  This test is not yet approved or cleared by the [place] FDA and  has been authorized for detection and/or diagnosis of SARS-CoV-2 by  FDA under an Emergency Use Authorization (EUA). This EUA will remain  in effect (meaning this test can be used) for the duration of the  Covid-19 declaration under Section 564(b)(1) of the Act, 21  U.S.C. section 360bbb-3(b)(1),  unless the authorization is  terminated or revoked. Performed at [facility], [address] [place], [place] [address]          Radiology Studies: DG Chest 1 View  Result Date: 10/10/2020 CLINICAL DATA:  Status post trip and fall this morning. EXAM: CHEST  1 VIEW COMPARISON:  CT chest and single view of the chest 12/07/2018. FINDINGS: The lungs are emphysematous but clear. Cardiomegaly. Aortic atherosclerosis. No pneumothorax or pleural effusion. No acute or focal bony abnormality. IMPRESSION: No acute disease. Cardiomegaly. Aortic Atherosclerosis (ICD10-I70.0) and Emphysema (ICD10-J43.9). Electronically Signed   By: Inge Rise M.D.   On: 10/10/2020 13:25   DG HIP OPERATIVE UNILAT W OR W/O PELVIS RIGHT  Result Date: 10/10/2020 CLINICAL DATA:  Intramedullary nail placement EXAM: OPERATIVE RIGHT HIP (WITH PELVIS IF PERFORMED) 1 VIEWS TECHNIQUE: Fluoroscopic spot image(s) were submitted for interpretation post-operatively. COMPARISON:  X-ray from same day FINDINGS: Patient has undergone intramedullary nail placement for the previously demonstrated comminuted proximal right femur fracture. The osseous alignment is improved. The hardware is intact. There are expected postsurgical changes. There is a radiopaque foreign body at the level of the distal intramedullary nail that is favored to be external to  the patient and may represent a sponge. IMPRESSION: Status post ORIF of the right femur with improved osseous alignment. Electronically Signed   By: Constance Holster M.D.   On: 10/10/2020 19:33   DG Hip Unilat W or Wo Pelvis 2-3 Views Right  Result Date: 10/10/2020 CLINICAL DATA:  Fall, right hip pain EXAM: DG HIP (WITH OR WITHOUT PELVIS) 2-3V RIGHT COMPARISON:  None. FINDINGS: Acute comminuted and displaced fracture of the proximal right femur. Fracture involves the lesser trochanter and subtrochanteric aspects of the femur. There is greater than 1 shaft with of medial  displacement. Anterior apex angulation. Hip joint is intact without dislocation. Pelvic bony ring appears intact. Large volume of stool within the rectum. IMPRESSION: 1. Acute comminuted and displaced fracture of the proximal right femur as described. 2. Large volume of stool within the rectum. Electronically Signed   By: Davina Poke D.O.   On: 10/10/2020 13:27        Scheduled Meds: . acetaminophen  500 mg Oral Q6H  . vitamin C  500 mg Oral Daily  . calcium carbonate  1 tablet Oral Q breakfast   And  . cholecalciferol  400 Units Oral Daily  . carvedilol  25 mg Oral BID WC  . cloNIDine  0.1 mg Oral BID  . docusate sodium  100 mg Oral BID  . enoxaparin (LOVENOX) injection  40 mg Subcutaneous Q24H  . escitalopram  10 mg Oral Daily  . umeclidinium bromide  1 puff Inhalation Daily   And  . fluticasone furoate-vilanterol  1 puff Inhalation Daily  . furosemide  40 mg Oral Daily  . multivitamin with minerals   Oral Daily  . pantoprazole  40 mg Oral Daily  . potassium chloride SA  20 mEq Oral BID  . raloxifene  60 mg Oral Daily  . ramipril  20 mg Oral [id number]   Continuous Infusions: . sodium chloride 75 mL/hr at 10/11/20 0500  .  ceFAZolin (ANCEF) IV 2 g (10/11/20 0551)    Assessment & Plan:   Principal Problem:   Fracture of femoral neck, right, closed ([place]) Active Problems:   Hypokalemia   HTN (hypertension)   Fall   Chronic systolic CHF (congestive heart failure) (HCC)   COPD (chronic obstructive pulmonary disease) with emphysema (HCC)   Thrombocytopenia (HCC)  Fracture of femoral neck-s/p fall Ortho was consulted, patient is status post ORIF on 11/10 by Dr. Roland Rack Pain control Bowel movement PT recommends SNF   Hypertension BP low post surgery, will discontinue blood pressure medications Gentle hydration, avoid volume overload  Post-op Anemia- Hg 6.9 Will transfuse 1 unit packed red blood cells today.  Discussed this with patient's daughter who is agreeable  to transfusion.  Unable to discuss with patient as she is very sleepy and falls asleep during our discussion   COPD with chronic respiratory failure Currently without exacerbation On 3 L continuously Continue inhalers   Depression  Continue Lexapro    Chronic systolic heart failure Last known LVEF of 45% Since BP very low has postop anemia will discontinue Lasix and carvedilol Monitor I's and O's and volume status   Hypokalemia Secondary to diuretic use Was supplemented  K is 5.0, will discontinue all potassium supplementation to prevent further increase    Chronic thrombocytopenia Monitor closely for bleeding    DVT prophylaxis: Lovenox Code Status: DNR Family Communication: Daughter at bedside  Status is: Inpatient  Remains inpatient appropriate because:Inpatient level of care appropriate due to severity of illness  Dispo: The patient is from: Home              Anticipated d/c is to: SNF              Anticipated d/c date is: 2 days              Patient currently is not medically stable to d/c.  Currently patient is anemic, receiving transfusion            LOS: 1 day   Time spent: 45 minutes with more than 50% on [place], MD [facility] Pager [phone]  If 7PM-7AM, please contact night-coverage [email or web address] Password [email or web address] 10/11/2020, 8:40 AM

## 2020-10-11 NOTE — Evaluation (Addendum)
Physical Therapy Evaluation Patient Details Name: MICHIAH MASSE MRN: [id number] DOB: 1928/11/22 Today's Date: 10/11/2020   History of Present Illness  presented to ER secondary to mechanical fall with acute onset of R hip pain (down on floor x4 hours prior to recovery/assist); admitted for management of comminuted, displaced R proximal femur fracture, s/p IM nailing (10/10/20), PWB.  Clinical Impression  Patient resting in room, daughter at bedside, upon arrival to room.  Easily awakens to voice, light tough; noted difficulty sustaining alertness/wakefulness throughout session. Endorses significant pain in R LE (FACES 8/10); meds requested and administered per RN during session.  Bilat UE, L LE strength and ROM grossly WFL; significant limitations to R hip, knee and ankle ROM due to pain.  Requires act assist for movement throughout all planes; tolerating only small-range ROM (approx 30 degrees R hip/knee).  Additional mobility assessment deferred at this time until pain better controlled and BP/HgB improved (most recent BP 83/45, HgB 6.9 per chart review).  Will continue to assess/progress as appropriate in subsequent sessions; do anticipate very gradual progress throughout course of rehab.  Anticipate need for +2 with additional mobility/OOB attempts. Would benefit from skilled PT to address above deficits and promote optimal return to PLOF.; recommend transition to STR upon discharge from acute hospitalization.     Follow Up Recommendations SNF    Equipment Recommendations       Recommendations for Other Services       Precautions / Restrictions Precautions Precautions: Fall Restrictions Weight Bearing Restrictions: Yes RLE Weight Bearing: Partial weight bearing      Mobility  Bed Mobility Overal bed mobility: Needs Assistance             General bed mobility comments: repositioning to comfort, dep assist from therapist    Transfers                 General  transfer comment: unsafe/unable to tolerate at this time  Ambulation/Gait             General Gait Details: unsafe/unable to tolerate at this time  Stairs            Wheelchair Mobility    Modified Rankin (Stroke Patients Only)       Balance                                             Pertinent Vitals/Pain Pain Assessment: Faces Faces Pain Scale: Hurts whole lot Pain Location: R hip Pain Descriptors / Indicators: Aching;Guarding;Grimacing;Moaning Pain Intervention(s): Limited activity within patient's tolerance;Monitored during session;Repositioned;Patient requesting pain meds-RN notified;RN gave pain meds during session    Home Living Family/patient expects to be discharged to:: Private residence Living Arrangements: Alone Available Help at Discharge: Family;Available PRN/intermittently Type of Home: Apartment Home Access: Level entry     Home Layout: One level Home Equipment: Cane - single point;Bedside commode Additional Comments: Resident of the [place] facility at [facility]    Prior Function Level of Independence: Independent         Comments: Mod indep without assist device for ADLs, household and limited community distances; drives short distances; no additional fall history reported.  Home O2 at 3L     Hand Dominance        Extremity/Trunk Assessment   Upper Extremity Assessment Upper Extremity Assessment: Overall WFL for tasks assessed (grossly at least 4/5  throughout)    Lower Extremity Assessment Lower Extremity Assessment: Generalized weakness (R LE grossly 3-/5 throughout, limited signficantly by pain.  Tolerating only approx 30 degrees of hip/knee flexion.  L LE grossly WFL, at least 4-/5 throughout)       Communication   Communication: HOH  Cognition Arousal/Alertness: Lethargic Behavior During Therapy: WFL for tasks assessed/performed                                   General  Comments: oriented to self, general location and situation; frequently closing eyes, drifting to sleep during session (mod/max cuing for alertness)      General Comments      Exercises Other Exercises Other Exercises: Supine R LE therex, 1x10-12, act assist ROM: ankle pumps, quad sets, SAQs, heel slides, hip abduct/adduct.  Consistent verbal cuing and active assist for alertness, attention to task during session.  Very guarded throughout all ranges, tolerating very limited ranges due to pain.  Mild sanguinous drainage noted to distal honeycomb dressing (appears new); RN informed/aware.   Assessment/Plan    PT Assessment Patient needs continued PT services  PT Problem List Decreased strength;Decreased range of motion;Decreased activity tolerance;Decreased balance;Decreased mobility;Decreased coordination;Decreased cognition;Decreased knowledge of use of DME;Decreased safety awareness;Decreased knowledge of precautions;Cardiopulmonary status limiting activity;Decreased skin integrity;Pain       PT Treatment Interventions DME instruction;Gait training;Functional mobility training;Therapeutic activities;Therapeutic exercise;Balance training;Cognitive remediation;Patient/family education    PT Goals (Current goals can be found in the Care Plan section)  Acute Rehab PT Goals Patient Stated Goal: to improve pain PT Goal Formulation: With patient Time For Goal Achievement: 10/25/20 Potential to Achieve Goals: Fair    Frequency 7X/week   Barriers to discharge        Co-evaluation               AM-PAC PT "6 Clicks" Mobility  Outcome Measure Help needed turning from your back to your side while in a flat bed without using bedrails?: Total Help needed moving from lying on your back to sitting on the side of a flat bed without using bedrails?: Total Help needed moving to and from a bed to a chair (including a wheelchair)?: Total Help needed standing up from a chair using your arms  (e.g., wheelchair or bedside chair)?: Total Help needed to walk in hospital room?: Total Help needed climbing 3-5 steps with a railing? : Total 6 Click Score: 6    End of Session   Activity Tolerance: Patient limited by fatigue;Patient limited by pain Patient left: in bed;with call bell/phone within reach;with bed alarm set Nurse Communication: Mobility status PT Visit Diagnosis: Muscle weakness (generalized) (M62.81);Difficulty in walking, not elsewhere classified (R26.2);Pain Pain - Right/Left: Right Pain - part of body: Hip;Knee    Time: 6468-0321 PT Time Calculation (min) (ACUTE ONLY): 18 min   Charges:   PT Evaluation $PT Eval Moderate Complexity: 1 Mod PT Treatments $Therapeutic Exercise: 8-22 mins       Jahniah Pallas H. Owens Shark, PT, DPT, NCS 10/11/20, 11:07 AM [phone]

## 2020-10-11 NOTE — Progress Notes (Signed)
Initial Nutrition Assessment  DOCUMENTATION CODES:   Severe malnutrition in context of chronic illness  INTERVENTION:  Downgrade diet to dysphagia 3 (mechanical soft).  Provide Ensure Enlive po BID, each supplement provides 350 kcal and 20 grams of protein.  Provide Magic cup TID with meals, each supplement provides 290 kcal and 9 grams of protein.  Provide MVI po daily.  NUTRITION DIAGNOSIS:   Severe Malnutrition related to chronic illness (CHF, COPD) as evidenced by severe fat depletion, severe muscle depletion.  GOAL:   Patient will meet greater than or equal to 90% of their needs  MONITOR:   PO intake, Supplement acceptance, Labs, Weight trends, Skin, I & O's  REASON FOR ASSESSMENT:   Consult Assessment of nutrition requirement/status  ASSESSMENT:   86 year old female with PMHx of asthma, traumatic SDH, PVD, CHF, GERD, hx hiatal hernia, HTN, arthritis, HTN, COPD, emphysema of lung, anxiety, left breast cancer s/p mastectomy admitted after a fall with fracture of right femoral neck s/p reduction and internal fixation on 11/10.   Met with patient at bedside this morning. No family present at time of RD assessment. Patient was sleepy. She reports she typically has a good appetite and intake. She is unable to provide any details on intake. She was NPO yesterday for surgery. Her breakfast tray arrived during RD assessment. Noted patient with upper dentures. Patient endorses some difficulty with chewing tougher foods. She reports she does not require any assistance with eating. Discussed increased nutrient needs for post-operative healing. Patient is amenable to drinking oral nutrition supplements to help meet increased needs.  Patient denies weight loss. Per review of chart patient has been fairly weight-stable the past year. Weights fluctuate between 50-54 kg.  Medications reviewed and include: vitamin C 500 mg daily, calcium carbonate 1 tablet daily, vitamin D3 400 units  daily, Colace 100 mg BID, MVI, Protonix, NS at 75 mL/hr.  Labs reviewed: Sodium 131, Chloride 95.  NUTRITION - FOCUSED PHYSICAL EXAM:    Most Recent Value  Orbital Region Severe depletion  Upper Arm Region Severe depletion  Thoracic and Lumbar Region Severe depletion  Buccal Region Severe depletion  Temple Region Severe depletion  Clavicle Bone Region Severe depletion  Clavicle and Acromion Bone Region Severe depletion  Scapular Bone Region Moderate depletion  Dorsal Hand Severe depletion  Patellar Region Severe depletion  Anterior Thigh Region Severe depletion  Posterior Calf Region Severe depletion  Edema (RD Assessment) None  Hair Reviewed  Eyes Reviewed  Mouth Reviewed  Skin Reviewed  Nails Reviewed     Diet Order:   Diet Order            Diet heart healthy/carb modified Room service appropriate? Yes; Fluid consistency: Thin  Diet effective now                EDUCATION NEEDS:   No education needs have been identified at this time  Skin:  Skin Assessment: Skin Integrity Issues: Skin Integrity Issues:: Incisions Incisions: closed incision right leg  Last BM:  10/10/2020 per chart  Height:   Ht Readings from Last 1 Encounters:  10/10/20 5' 4" (1.626 m)   Weight:   Wt Readings from Last 1 Encounters:  10/10/20 51.3 kg   Ideal Body Weight:  54.5 kg  BMI:  Body mass index is 19.41 kg/m.  Estimated Nutritional Needs:   Kcal:  1400-1600  Protein:  75-85 grams  Fluid:  1.3-1.5 L/day  Melanie Barnacle, MS, RD, LDN Pager number available on Amion

## 2020-10-12 DIAGNOSIS — E871 Hypo-osmolality and hyponatremia: Secondary | ICD-10-CM

## 2020-10-12 LAB — BASIC METABOLIC PANEL
Anion gap: 8 (ref 5–15)
BUN: 23 mg/dL (ref 8–23)
CO2: 28 mmol/L (ref 22–32)
Calcium: 8.2 mg/dL — ABNORMAL LOW (ref 8.9–10.3)
Chloride: 93 mmol/L — ABNORMAL LOW (ref 98–111)
Creatinine, Ser: 1.09 mg/dL — ABNORMAL HIGH (ref 0.44–1.00)
GFR, Estimated: 48 mL/min — ABNORMAL LOW (ref >60)
Glucose, Bld: 134 mg/dL — ABNORMAL HIGH (ref 70–99)
Potassium: 4.8 mmol/L (ref 3.5–5.1)
Sodium: 129 mmol/L — ABNORMAL LOW (ref 135–145)

## 2020-10-12 LAB — CBC
HCT: 23.5 % — ABNORMAL LOW (ref 36.0–46.0)
Hemoglobin: 8 g/dL — ABNORMAL LOW (ref 12.0–15.0)
MCH: 31.3 pg (ref 26.0–34.0)
MCHC: 34 g/dL (ref 30.0–36.0)
MCV: 91.8 fL (ref 80.0–100.0)
Platelets: 90 10*3/uL — ABNORMAL LOW (ref 150–400)
RBC: 2.56 MIL/uL — ABNORMAL LOW (ref 3.87–5.11)
RDW: 13.1 % (ref 11.5–15.5)
WBC: 12.6 10*3/uL — ABNORMAL HIGH (ref 4.0–10.5)
nRBC: 0 % (ref 0.0–0.2)

## 2020-10-12 LAB — BRAIN NATRIURETIC PEPTIDE: B Natriuretic Peptide: 343.3 pg/mL — ABNORMAL HIGH (ref 0.0–100.0)

## 2020-10-12 MED ORDER — ENOXAPARIN SODIUM 40 MG/0.4ML ~~LOC~~ SOLN: 40.0000 mg | SUBCUTANEOUS | 0 refills | Status: AC

## 2020-10-12 MED ORDER — ENOXAPARIN SODIUM 30 MG/0.3ML ~~LOC~~ SOLN
30.0000 mg | SUBCUTANEOUS | Status: DC
  Administered 2020-10-12 – 2020-10-13 (×2): 30 mg via SUBCUTANEOUS
  Filled 2020-10-12 (×2): qty 0.3

## 2020-10-12 MED ORDER — TRAMADOL HCL 50 MG PO TABS: 50.0000 mg | ORAL_TABLET | Freq: Four times a day (QID) | ORAL | 0 refills | Status: AC | PRN

## 2020-10-12 NOTE — Progress Notes (Signed)
Subjective: 2 Days Post-Op Procedure(s) (LRB): INTRAMEDULLARY (IM) NAIL INTERTROCHANTRIC (Right) Patient does not give me a verbal pain score this morning.   Plan is for discharge to SNF when able. Negative for chest pain and shortness of breath Fever: no Gastrointestinal:Negative for nausea and vomiting.   Patient shakes her head to answer yes or no questions this AM.  Objective: Vital signs in last 24 hours: Temp:  [97.7 F (36.5 C)-99 F (37.2 C)] 99 F (37.2 C) (11/12 0724) Pulse Rate:  [66-95] 89 (11/12 0724) Resp:  [16-19] 18 (11/12 0724) BP: (83-136)/(43-64) 126/57 (11/12 0724) SpO2:  [92 %-100 %] 95 % (11/12 0724)  Intake/Output from previous day:  Intake/Output Summary (Last 24 hours) at 10/12/2020 0733 Last data filed at 10/11/2020 2200 Gross per 24 hour  Intake 404 ml  Output --  Net 404 ml    Intake/Output this shift: No intake/output data recorded.  Labs: Recent Labs    10/10/20 1245 10/11/20 0602 10/11/20 2055 10/12/20 0444  HGB 10.7* 6.9* 7.9* 8.0*   Recent Labs    10/11/20 0602 10/12/20 0444  WBC 8.6 12.6*  RBC 2.22* 2.56*  HCT 21.5* 23.5*  PLT 93* 90*   Recent Labs    10/11/20 0602 10/12/20 0444  NA 131* 129*  K 5.0 4.8  CL 95* 93*  CO2 29 28  BUN 14 23  CREATININE 0.95 1.09*  GLUCOSE 134* 134*  CALCIUM 8.3* 8.2*   No results for input(s): LABPT, INR in the last 72 hours.   EXAM General - Patient is Alert Extremity - ABD soft Sensation intact distally Intact pulses distally Dorsiflexion/Plantar flexion intact Incision: scant drainage No cellulitis present Dressing/Incision - blood tinged drainage to the proximal incision site. Motor Function - intact, moving foot and toes well on exam.  Abdomen soft with normal bowel sounds.  Past Medical History:  Diagnosis Date  . Anxiety   . Arthritis 2019   mostly in neck  . Asthma   . Breast cancer ([place]) 1991   mastectomy left  . Cancer ([place]) 1991   L Breast  . CHF  (congestive heart failure) ([place])   . COPD (chronic obstructive pulmonary disease) ([place])    hypoxia with resp failure admission in 03/2018  . Dementia ([place])    confusion beginning  . Emphysema of lung ([place])   . GERD (gastroesophageal reflux disease)   . Heart murmur   . History of hiatal hernia   . Hypertension 03/2018   recent hypertension urgency ..admitted in april 2019  . Hypoxia 03/2018   O2 saturation runs 86-91% on Room Air.  . Intermittent dysphagia   . Peripheral vascular disease ([place])   . Shortness of breath dyspnea   . Traumatic subdural hematoma ([facility])    Assessment/Plan: 2 Days Post-Op Procedure(s) (LRB): INTRAMEDULLARY (IM) NAIL INTERTROCHANTRIC (Right) Principal Problem:   Fracture of femoral neck, right, closed ([facility]) Active Problems:   Hypokalemia   HTN (hypertension)   Fall   Chronic systolic CHF (congestive heart failure) ([facility])   COPD (chronic obstructive pulmonary disease) with emphysema (HCC)   Thrombocytopenia ([facility])  Estimated body mass index is 19.41 kg/m as calculated from the following:   Height as of this encounter: 5\' 4"  (1.626 m).   Weight as of this encounter: 51.3 kg. Up with therapy   Labs reviewed today, Hg 8.0, underwent transfusion of 1 unit of PRBC today. Patient was talkative yesterday, not talking this AM, shakes her head to answer questions.  Continue to monitor.  Stick with tylenol for discomfort at this time. Will need SNF upon discharge. Begin working on BM.  DVT Prophylaxis - Lovenox, Foot Pumps and TED hose Weight-Bearing as tolerated to right leg  J. Cameron Proud, PA-C [facility] Orthopaedic Surgery 10/12/2020, 7:33 AM

## 2020-10-12 NOTE — Progress Notes (Signed)
Physical Therapy Treatment Patient Details Name: Melanie Huffman MRN: [id number] DOB: 25-Oct-1928 Today's Date: 10/12/2020    History of Present Illness presented to ER secondary to mechanical fall with acute onset of R hip pain (down on floor x4 hours prior to recovery/assist); admitted for management of comminuted, displaced R proximal femur fracture, s/p IM nailing (10/10/20), PWB.    PT Comments    Therapist requested per daughter to assist patient with return to bed; has been up in recliner since AM session. Assisted back to bed with total assist +2; absent recall of transfer instruction/technique from AM session.   Significant serosanguinous drainage noted from distal incision of R LE; RN informed/aware and to communicate with MD.     Follow Up Recommendations  SNF     Equipment Recommendations       Recommendations for Other Services       Precautions / Restrictions Precautions Precautions: Fall Restrictions Weight Bearing Restrictions: Yes RLE Weight Bearing: Partial weight bearing    Mobility  Bed Mobility Overal bed mobility: Needs Assistance Bed Mobility: Sit to Supine       Sit to supine: Total assist;+2 for physical assistance   General bed mobility comments: dep assist for trunk control and LE elevation over edge of bed; poor tolerance for dissociation of LEs/trunk, limited by pain  Transfers Overall transfer level: Needs assistance   Transfers: Lateral/Scoot Transfers          Lateral/Scoot Transfers: Total assist;+2 physical assistance General transfer comment: absent carry-over of transfer from AM session; continued total assist for mechanics and lateral movement.  Ambulation/Gait                 Stairs             Wheelchair Mobility    Modified Rankin (Stroke Patients Only)       Balance Overall balance assessment: Needs assistance Sitting-balance support: No upper extremity supported;Feet supported Sitting  balance-Leahy Scale: Fair                                      Cognition Arousal/Alertness: Awake/alert Behavior During Therapy: Flat affect                                   General Comments: improved alertness today; moderately confused, oriented to self only.  Does follow commands when prompted, but difficulty with task initiation/decision making due to confusion      Exercises      General Comments        Pertinent Vitals/Pain Pain Assessment: Faces Faces Pain Scale: Hurts even more Pain Location: R hip Pain Descriptors / Indicators: Aching;Guarding;Grimacing;Moaning Pain Intervention(s): Limited activity within patient's tolerance;Monitored during session;Premedicated before session;Repositioned    Home Living                      Prior Function            PT Goals (current goals can now be found in the care plan section) Acute Rehab PT Goals Patient Stated Goal: to improve pain PT Goal Formulation: With patient Time For Goal Achievement: 10/25/20 Potential to Achieve Goals: Fair Progress towards PT goals: Progressing toward goals    Frequency    7X/week      PT Plan Current plan remains appropriate  Co-evaluation              AM-PAC PT "6 Clicks" Mobility   Outcome Measure  Help needed turning from your back to your side while in a flat bed without using bedrails?: Total Help needed moving from lying on your back to sitting on the side of a flat bed without using bedrails?: Total Help needed moving to and from a bed to a chair (including a wheelchair)?: Total Help needed standing up from a chair using your arms (e.g., wheelchair or bedside chair)?: Total Help needed to walk in hospital room?: Total Help needed climbing 3-5 steps with a railing? : Total 6 Click Score: 6    End of Session   Activity Tolerance: Patient limited by fatigue;Patient limited by pain Patient left: with call bell/phone within  reach;with family/visitor present;in bed;with bed alarm set Nurse Communication: Mobility status PT Visit Diagnosis: Muscle weakness (generalized) (M62.81);Difficulty in walking, not elsewhere classified (R26.2);Pain Pain - Right/Left: Right Pain - part of body: Hip;Knee     Time: 5997-7414 PT Time Calculation (min) (ACUTE ONLY): 16 min  Charges:  $Therapeutic Activity: 8-22 mins                     Jarian Longoria H. Owens Shark, PT, DPT, NCS 10/12/20, 3:53 PM [phone]

## 2020-10-12 NOTE — Discharge Instructions (Signed)

## 2020-10-12 NOTE — TOC Progression Note (Signed)
Transition of Care (TOC) - Progression Note    Patient Details  Name: Ski B Cozza MRN: [id number] Date of Birth: 04/25/1928  Transition of Care (TOC) CM/SW Contact   D , RN Phone Number: 10/12/2020, 3:16 PM  Clinical Narrative:   RNCM met with patient's daughter who has now decided to accept bed at [facility]. She reported that she may still choose to transition her mother closer to her but at this point she wants her to stay where her mother is more comfortable.  RNCM reached out to Tammy with [employer] and started insurance authorization.     Expected Discharge Plan: Skilled Nursing Facility Barriers to Discharge: Continued Medical Work up, SNF Pending bed offer  Expected Discharge Plan and Services Expected Discharge Plan: Skilled Nursing Facility In-house Referral: Clinical Social Work   Post Acute Care Choice: Home Health, Skilled Nursing Facility Living arrangements for the past 2 months: Independent Living Facility ([facility] in [place])                                       Social Determinants of Health (SDOH) Interventions    Readmission Risk Interventions No flowsheet data found.  

## 2020-10-12 NOTE — Plan of Care (Signed)
  Problem: Education: Goal: Knowledge of General Education information will improve Description: Including pain rating scale, medication(s)/side effects and non-pharmacologic comfort measures Outcome: Progressing   Problem: Health Behavior/Discharge Planning: Goal: Ability to manage health-related needs will improve Outcome: Progressing   Problem: Clinical Measurements: Goal: Ability to maintain clinical measurements within normal limits will improve Outcome: Progressing Goal: Will remain free from infection Outcome: Progressing Goal: Diagnostic test results will improve Outcome: Progressing Goal: Respiratory complications will improve Outcome: Progressing Goal: Cardiovascular complication will be avoided Outcome: Progressing   Problem: Activity: Goal: Risk for activity intolerance will decrease Outcome: Progressing   Problem: Nutrition: Goal: Adequate nutrition will be maintained Outcome: Progressing   Problem: Coping: Goal: Level of anxiety will decrease Outcome: Progressing   Problem: Elimination: Goal: Will not experience complications related to bowel motility Outcome: Progressing Goal: Will not experience complications related to urinary retention Outcome: Progressing   Problem: Pain Managment: Goal: General experience of comfort will improve Outcome: Progressing   Problem: Safety: Goal: Ability to remain free from injury will improve Outcome: Progressing   Problem: Skin Integrity: Goal: Risk for impaired skin integrity will decrease Outcome: Progressing   Problem: Education: Goal: Verbalization of understanding the information provided (i.e., activity precautions, restrictions, etc) will improve Outcome: Progressing Goal: Individualized Educational Video(s) Outcome: Progressing   Problem: Activity: Goal: Ability to ambulate and perform ADLs will improve Outcome: Progressing   Problem: Clinical Measurements: Goal: Postoperative complications will be  avoided or minimized Outcome: Progressing   Problem: Self-Concept: Goal: Ability to maintain and perform role responsibilities to the fullest extent possible will improve Outcome: Progressing   Problem: Pain Management: Goal: Pain level will decrease Outcome: Progressing   Problem: Education: Goal: Required Educational Video(s) Outcome: Progressing   Problem: Clinical Measurements: Goal: Ability to maintain clinical measurements within normal limits will improve Outcome: Progressing Goal: Postoperative complications will be avoided or minimized Outcome: Progressing   Problem: Skin Integrity: Goal: Demonstration of wound healing without infection will improve Outcome: Progressing   Problem: Education: Goal: Understanding of discharge needs will improve Outcome: Progressing   Problem: Activity: Goal: Ability to tolerate increased activity will improve Outcome: Progressing   Problem: Clinical Measurements: Goal: Postoperative complications will be avoided or minimized Outcome: Progressing

## 2020-10-12 NOTE — Progress Notes (Signed)
Physical Therapy Treatment Patient Details Name: ADAHLIA Huffman MRN: [id number] DOB: 20-Apr-1928 Today's Date: 10/12/2020    History of Present Illness presented to ER secondary to mechanical fall with acute onset of R hip pain (down on floor x4 hours prior to recovery/assist); admitted for management of comminuted, displaced R proximal femur fracture, s/p IM nailing (10/10/20), PWB.    PT Comments    Improved alertness and active participation with session this date; does demonstrate moderate levels of confusion, oriented to self only at this time.  Able to complete OOB to chair, lateral/scoot pivot, over level surfaces, max/total assist +2 for forward trunk lean/weight shift, lift off, lateral movement and position/protection of R LE.  Limited ability to comprehend R LE PWB restrictions.  Daughter with questions about "severity of break" (referred her to orthopedic physician) and recommendations for care at discharge.  Discussed recommendations for STR and functional implications of WBing restrictions; anticipated rate of progress.  Daughter considering long-term transition to higher level of care; encouraged her to discuss with care team at rehab facility as more known about long-term care needs post-rehab course.  Daughter appreciative of active listening and discussion about patient participation with rehab.    Follow Up Recommendations  SNF     Equipment Recommendations       Recommendations for Other Services       Precautions / Restrictions Precautions Precautions: Fall Restrictions Weight Bearing Restrictions: Yes RLE Weight Bearing: Partial weight bearing    Mobility  Bed Mobility Overal bed mobility: Needs Assistance Bed Mobility: Supine to Sit     Supine to sit: Total assist;Max assist;+2 for physical assistance     General bed mobility comments: assist for trunk control, LE management  Transfers Overall transfer level: Needs assistance   Transfers:  Lateral/Scoot Transfers          Lateral/Scoot Transfers: Max assist;Total assist;+2 physical assistance General transfer comment: level surface transfers; max/dep assist for forward trunk lean/weight shift, lift off, lateral movement and position/protection of R LE  Ambulation/Gait             General Gait Details: unsafe/unable to tolerate at this time   Stairs             Wheelchair Mobility    Modified Rankin (Stroke Patients Only)       Balance Overall balance assessment: Needs assistance Sitting-balance support: No upper extremity supported;Feet supported Sitting balance-Leahy Scale: Fair Sitting balance - Comments: mod assist initially, improving to close sup with accommodation to position                                    Cognition Arousal/Alertness: Awake/alert Behavior During Therapy: WFL for tasks assessed/performed Overall Cognitive Status: Within Functional Limits for tasks assessed                                 General Comments: improved alertness today; moderately confused, oriented to self only.  Does follow commands when prompted, but difficulty with task initiation/decision making due to confusion      Exercises Other Exercises Other Exercises: Supine R LE therex, 1x10, act assist: ankle pumps, heel slides, hip adduct/abduct.  Slightly more movement throughout all ranges, less guarding this date    General Comments        Pertinent Vitals/Pain Pain Assessment: Faces Faces Pain Scale: Hurts even  more Pain Location: R hip Pain Descriptors / Indicators: Aching;Guarding;Grimacing;Moaning Pain Intervention(s): Limited activity within patient's tolerance;Monitored during session;Premedicated before session;Repositioned    Home Living                      Prior Function            PT Goals (current goals can now be found in the care plan section) Acute Rehab PT Goals Patient Stated Goal: to  improve pain PT Goal Formulation: With patient Time For Goal Achievement: 10/25/20 Potential to Achieve Goals: Fair Progress towards PT goals: Progressing toward goals    Frequency    7X/week      PT Plan Current plan remains appropriate    Co-evaluation              AM-PAC PT "6 Clicks" Mobility   Outcome Measure  Help needed turning from your back to your side while in a flat bed without using bedrails?: Total Help needed moving from lying on your back to sitting on the side of a flat bed without using bedrails?: Total Help needed moving to and from a bed to a chair (including a wheelchair)?: Total Help needed standing up from a chair using your arms (e.g., wheelchair or bedside chair)?: Total Help needed to walk in hospital room?: Total Help needed climbing 3-5 steps with a railing? : Total 6 Click Score: 6    End of Session   Activity Tolerance: Patient tolerated treatment well Patient left: in chair;with call bell/phone within reach;with chair alarm set;with family/visitor present Nurse Communication: Mobility status PT Visit Diagnosis: Muscle weakness (generalized) (M62.81);Difficulty in walking, not elsewhere classified (R26.2);Pain Pain - Right/Left: Right Pain - part of body: Hip;Knee     Time: 2620-3559 PT Time Calculation (min) (ACUTE ONLY): 32 min  Charges:  $Therapeutic Exercise: 8-22 mins $Therapeutic Activity: 8-22 mins                    Kalie Cabral H. Owens Shark, PT, DPT, NCS 10/12/20, 10:45 AM [phone]

## 2020-10-12 NOTE — Progress Notes (Signed)
Physical Therapy in pt room to transfer pt back to bed from recliner, in which a moderate amount of serosanguineous drainage noted to disposable chuck and pt's gown - MD Naida Sleight aware and MD Poggy aware. Pt transferred back to bed with assistance of two personnel, old honey comb dressing removed from lower incision to (R) thigh, surgical incision and staples intact, three 4x4's and honey comb dressing placed over surgical site. NAD noted, call bell within reach, will continue to monitor.

## 2020-10-12 NOTE — Progress Notes (Signed)
PHARMACIST - PHYSICIAN COMMUNICATION  CONCERNING:  Enoxaparin (Lovenox) for DVT Prophylaxis    RECOMMENDATION: Patient was prescribed enoxaparin 40mg  q24 hours for VTE prophylaxis.   Filed Weights   10/10/20 1233 10/10/20 1501  Weight: 51.3 kg (113 lb) 51.3 kg (113 lb 1.5 oz)    Body mass index is 19.41 kg/m.  Estimated Creatinine Clearance: 27.8 mL/min (A) (by C-G formula based on SCr of 1.09 mg/dL (H)).  Patient is candidate for enoxaparin 30mg  every 24 hours based on CrCl <20ml/min or Weight <45kg  DESCRIPTION: Pharmacy has adjusted enoxaparin dose per [facility] policy.  Patient is now receiving enoxaparin 30 mg every 24 hours   Benita Gutter 10/12/2020 8:07 AM

## 2020-10-12 NOTE — Progress Notes (Signed)
MD Poggy at pt's bedside, one abdominal gauze and ACE Wrap applied to (R) thigh at this time. NAD noted, will continue to monitor.

## 2020-10-12 NOTE — Care Management Important Message (Signed)
Important Message  Patient Details  Name: LATRONDA SPINK MRN: [id number] Date of Birth: [birth year]   Medicare Important Message Given:  N/A - LOS <3 / Initial given by admissions     Juliann Pulse A Kieli Golladay 10/12/2020, 9:55 AM

## 2020-10-12 NOTE — Progress Notes (Signed)
Melanie NOTE    Melanie Huffman  OEU:[id number] DOB: 1928-11-10 DOA: 10/10/2020 PCP: Dion Body, Melanie    Brief Narrative:  Melanie Huffman is a 86 y.o. female with medical history significant for COPD, CHF, hypertension, anxiety disorder who was brought into the emergency room by EMS for evaluation of a fall  11/11-she underwent ORIF by Dr. Roland Rack on 11/10 Hemoglobin 6.9 this a.m.  11/12- no overnight issues. Hg 8.0 today. Daughter stated pt was confused a little this am. Ate some food. Also was given pain med this am  Consultants:   orthopedics  Procedures:   Antimicrobials:       Subjective: Pt sleepy, falls asleep during my exam.   Objective: Vitals:   10/11/20 2346 10/12/20 0427 10/12/20 0724 10/12/20 1204  BP: 136/64 (!) 133/59 (!) 126/57 (!) 122/56  Pulse: 95 94 89 81  Resp: 19 16 18 18   Temp: 98.7 F (37.1 C) 98.4 F (36.9 C) 99 F (37.2 C) 98.6 F (37 C)  TempSrc: Oral Oral Oral Oral  SpO2: 94% 92% 95% 97%  Weight:      Height:        Intake/Output Summary (Last 24 hours) at 10/12/2020 1309 Last data filed at 10/11/2020 2200 Gross per 24 hour  Intake 404 ml  Output --  Net 404 ml   Filed Weights   10/10/20 1233 10/10/20 1501  Weight: 51.3 kg 51.3 kg    Examination: Sitting inchair, daughter at bedside. Falls asleep during exam. Less pale today cta no w/r/r Reg, s1/s2, no murmur Soft benign, +bs No edema Unable to assess neuro    Data Reviewed: I have personally reviewed following labs and imaging studies  CBC: Recent Labs  Lab 10/10/20 1245 10/11/20 0602 10/11/20 2055 10/12/20 0444  WBC 9.9 8.6  --  12.6*  NEUTROABS 8.4*  --   --   --   HGB 10.7* 6.9* 7.9* 8.0*  HCT 31.8* 21.5*  --  23.5*  MCV 92.7 96.8  --  91.8  PLT 118* 93*  --  90*   Basic Metabolic Panel: Recent Labs  Lab 10/10/20 1245 10/11/20 0602 10/12/20 0444  NA 135 131* 129*  K 3.2* 5.0 4.8  CL 100 95* 93*  CO2 27 29 28   GLUCOSE 152* 134* 134*  BUN  7* 14 23  CREATININE 0.44 0.95 1.09*  CALCIUM 7.5* 8.3* 8.2*   GFR: Estimated Creatinine Clearance: 27.8 mL/min (A) (by C-G formula based on SCr of 1.09 mg/dL (H)). Liver Function Tests: No results for input(s): AST, ALT, ALKPHOS, BILITOT, PROT, ALBUMIN in the last 168 hours. No results for input(s): LIPASE, AMYLASE in the last 168 hours. No results for input(s): AMMONIA in the last 168 hours. Coagulation Profile: No results for input(s): INR, PROTIME in the last 168 hours. Cardiac Enzymes: Recent Labs  Lab 10/10/20 1245  CKTOTAL 75   BNP (last 3 results) No results for input(s): PROBNP in the last 8760 hours. HbA1C: No results for input(s): HGBA1C in the last 72 hours. CBG: No results for input(s): GLUCAP in the last 168 hours. Lipid Profile: No results for input(s): CHOL, HDL, LDLCALC, TRIG, CHOLHDL, LDLDIRECT in the last 72 hours. Thyroid Function Tests: No results for input(s): TSH, T4TOTAL, FREET4, T3FREE, THYROIDAB in the last 72 hours. Anemia Panel: No results for input(s): VITAMINB12, FOLATE, FERRITIN, TIBC, IRON, RETICCTPCT in the last 72 hours. Sepsis Labs: No results for input(s): PROCALCITON, LATICACIDVEN in the last 168 hours.  Recent Results (from the  past 240 hour(s))  Respiratory Panel by RT PCR (Flu A&B, Covid) - Nasopharyngeal Swab     Status: None   Collection Time: 10/10/20  1:38 PM   Specimen: Nasopharyngeal Swab  Result Value Ref Range Status   SARS Coronavirus 2 by RT PCR NEGATIVE NEGATIVE Final    Comment: (NOTE) SARS-CoV-2 target nucleic acids are NOT DETECTED.  The SARS-CoV-2 RNA is generally detectable in upper respiratoy specimens during the acute phase of infection. The lowest concentration of SARS-CoV-2 viral copies this assay can detect is 131 copies/mL. A negative result does not preclude SARS-Cov-2 infection and should not be used as the sole basis for treatment or other patient management decisions. A negative result may occur with   improper specimen collection/handling, submission of specimen other than nasopharyngeal swab, presence of viral mutation(s) within the areas targeted by this assay, and inadequate number of viral copies (<131 copies/mL). A negative result must be combined with clinical observations, patient history, and epidemiological information. The expected result is Negative.  Fact Sheet for Patients:  PinkCheek.be  Fact Sheet for Healthcare Providers:  GravelBags.it  This test is no t yet approved or cleared by the [place] FDA and  has been authorized for detection and/or diagnosis of SARS-CoV-2 by FDA under an Emergency Use Authorization (EUA). This EUA will remain  in effect (meaning this test can be used) for the duration of the COVID-19 declaration under Section 564(b)(1) of the Act, 21 U.S.C. section 360bbb-3(b)(1), unless the authorization is terminated or revoked sooner.     Influenza A by PCR NEGATIVE NEGATIVE Final   Influenza B by PCR NEGATIVE NEGATIVE Final    Comment: (NOTE) The Xpert Xpress SARS-CoV-2/FLU/RSV assay is intended as an aid in  the diagnosis of influenza from Nasopharyngeal swab specimens and  should not be used as a sole basis for treatment. Nasal washings and  aspirates are unacceptable for Xpert Xpress SARS-CoV-2/FLU/RSV  testing.  Fact Sheet for Patients: PinkCheek.be  Fact Sheet for Healthcare Providers: GravelBags.it  This test is not yet approved or cleared by the [place] FDA and  has been authorized for detection and/or diagnosis of SARS-CoV-2 by  FDA under an Emergency Use Authorization (EUA). This EUA will remain  in effect (meaning this test can be used) for the duration of the  Covid-19 declaration under Section 564(b)(1) of the Act, 21  U.S.C. section 360bbb-3(b)(1), unless the authorization is  terminated or  revoked. Performed at [facility], [address] [place], [place] [address]          Radiology Studies: DG Chest 1 View  Result Date: 10/10/2020 CLINICAL DATA:  Status post trip and fall this morning. EXAM: CHEST  1 VIEW COMPARISON:  CT chest and single view of the chest 12/07/2018. FINDINGS: The lungs are emphysematous but clear. Cardiomegaly. Aortic atherosclerosis. No pneumothorax or pleural effusion. No acute or focal bony abnormality. IMPRESSION: No acute disease. Cardiomegaly. Aortic Atherosclerosis (ICD10-I70.0) and Emphysema (ICD10-J43.9). Electronically Signed   By: Inge Rise M.D.   On: 10/10/2020 13:25   DG HIP OPERATIVE UNILAT W OR W/O PELVIS RIGHT  Result Date: 10/10/2020 CLINICAL DATA:  Intramedullary nail placement EXAM: OPERATIVE RIGHT HIP (WITH PELVIS IF PERFORMED) 1 VIEWS TECHNIQUE: Fluoroscopic spot image(s) were submitted for interpretation post-operatively. COMPARISON:  X-ray from same day FINDINGS: Patient has undergone intramedullary nail placement for the previously demonstrated comminuted proximal right femur fracture. The osseous alignment is improved. The hardware is intact. There are expected postsurgical changes. There is  a radiopaque foreign Huffman at the level of the distal intramedullary nail that is favored to be external to the patient and may represent a sponge. IMPRESSION: Status post ORIF of the right femur with improved osseous alignment. Electronically Signed   By: Constance Holster M.D.   On: 10/10/2020 19:33   DG Hip Unilat W or Wo Pelvis 2-3 Views Right  Result Date: 10/10/2020 CLINICAL DATA:  Fall, right hip pain EXAM: DG HIP (WITH OR WITHOUT PELVIS) 2-3V RIGHT COMPARISON:  None. FINDINGS: Acute comminuted and displaced fracture of the proximal right femur. Fracture involves the lesser trochanter and subtrochanteric aspects of the femur. There is greater than 1 shaft with of medial displacement. Anterior apex angulation. Hip joint  is intact without dislocation. Pelvic bony ring appears intact. Large volume of stool within the rectum. IMPRESSION: 1. Acute comminuted and displaced fracture of the proximal right femur as described. 2. Large volume of stool within the rectum. Electronically Signed   By: Davina Poke D.O.   On: 10/10/2020 13:27        Scheduled Meds: . vitamin C  500 mg Oral Daily  . calcium carbonate  1 tablet Oral Q breakfast   And  . cholecalciferol  400 Units Oral Daily  . docusate sodium  100 mg Oral BID  . enoxaparin (LOVENOX) injection  30 mg Subcutaneous Q24H  . escitalopram  10 mg Oral Daily  . feeding supplement  237 mL Oral BID BM  . umeclidinium bromide  1 puff Inhalation Daily   And  . fluticasone furoate-vilanterol  1 puff Inhalation Daily  . multivitamin with minerals   Oral Daily  . pantoprazole  40 mg Oral Daily  . raloxifene  60 mg Oral Daily   Continuous Infusions:   Assessment & Plan:   Principal Problem:   Fracture of femoral neck, right, closed ([place]) Active Problems:   Hypokalemia   HTN (hypertension)   Fall   Chronic systolic CHF (congestive heart failure) (HCC)   COPD (chronic obstructive pulmonary disease) with emphysema (HCC)   Thrombocytopenia (HCC)  Fracture of femoral neck-s/p fall S/p ORIF on 11/10 by Dr. Roland Rack  Pain control Bowel regiment PT rec. SNF- authorization pending, daughter picked a place today   Hypertension BP low post surgery, will discontinue blood pressure medications Gentle hydration, avoid volume overload 11/12-bp stable. Will d/c ivf.  Monitor bp, once elevated can add bp meds slowly  Post-op Anemia- Hg 6.9 S/p 1unit prbc on 11/11. Hg stable 8.o today Continue to monitor    COPD with chronic respiratory failure Without acute exacerbation  On chronic 3 L continuous  Continue inhalers    Depression  Continue Lexapro    Chronic systolic heart failure Last known LVEF of 45% Since BP was low, Lasix and   Monitor volume status and I's and O's      Hypokalemia Secondary to diuretic use Was supplemented  K is 5.0, will discontinue all potassium supplementation to prevent further increase 11/12-K stable . Continue to monitor  Hyponatremia- Na 129- etiology unclear.  Has decrease po intake, was on ivf. Unsure possibly due to hypervolemia. Will d/c ivf. Ck bnp  Lasix was d/c'd yesterday. Continue to monitor    Chronic thrombocytopenia Platelets close to baseline No signs of overt bleed. Continue to monitor    DVT prophylaxis: Lovenox Code Status: DNR Family Communication: Daughter at bedside  Status is: Inpatient  Remains inpatient appropriate because:Inpatient level of care appropriate due to severity of illness  Dispo: The patient is from: Home              Anticipated d/c is to: SNF              Anticipated d/c date is: 2 days              Patient currently is not medically stable to d/c.  Electrolyte imbalance, needs SNF placment, case mx working on it.          LOS: 2 days   Time spent: 45 minutes with more than 50% on [place], Melanie [facility] Pager [phone]  If 7PM-7AM, please contact night-coverage [email or web address] Password [email or web address] 10/12/2020, 1:09 PM

## 2020-10-12 NOTE — Progress Notes (Signed)
Pt noted to be incontinent of large amount of urine at this time, pt cleansed, repositioned and sitting up eating breakfast at this time.

## 2020-10-13 LAB — BASIC METABOLIC PANEL
Anion gap: 6 (ref 5–15)
BUN: 19 mg/dL (ref 8–23)
CO2: 29 mmol/L (ref 22–32)
Calcium: 8.3 mg/dL — ABNORMAL LOW (ref 8.9–10.3)
Chloride: 94 mmol/L — ABNORMAL LOW (ref 98–111)
Creatinine, Ser: 0.65 mg/dL (ref 0.44–1.00)
GFR, Estimated: >60 mL/min (ref >60)
Glucose, Bld: 122 mg/dL — ABNORMAL HIGH (ref 70–99)
Potassium: 4.8 mmol/L (ref 3.5–5.1)
Sodium: 129 mmol/L — ABNORMAL LOW (ref 135–145)

## 2020-10-13 LAB — CBC
HCT: 20.2 % — ABNORMAL LOW (ref 36.0–46.0)
Hemoglobin: 6.8 g/dL — ABNORMAL LOW (ref 12.0–15.0)
MCH: 31.5 pg (ref 26.0–34.0)
MCHC: 33.7 g/dL (ref 30.0–36.0)
MCV: 93.5 fL (ref 80.0–100.0)
Platelets: 92 10*3/uL — ABNORMAL LOW (ref 150–400)
RBC: 2.16 MIL/uL — ABNORMAL LOW (ref 3.87–5.11)
RDW: 13.4 % (ref 11.5–15.5)
WBC: 9.1 10*3/uL (ref 4.0–10.5)
nRBC: 0 % (ref 0.0–0.2)

## 2020-10-13 LAB — PREPARE RBC (CROSSMATCH)

## 2020-10-13 MED ORDER — ACETAMINOPHEN 325 MG PO TABS
650.0000 mg | ORAL_TABLET | Freq: Once | ORAL | Status: AC
  Administered 2020-10-13: 650 mg via ORAL
  Filled 2020-10-13: qty 2

## 2020-10-13 MED ORDER — SODIUM CHLORIDE 0.9 % IV SOLN: INTRAVENOUS | Status: DC

## 2020-10-13 MED ORDER — SODIUM CHLORIDE 0.9% IV SOLUTION: Freq: Once | INTRAVENOUS | Status: DC

## 2020-10-13 MED ORDER — FUROSEMIDE 10 MG/ML IJ SOLN
20.0000 mg | Freq: Once | INTRAMUSCULAR | Status: AC
  Administered 2020-10-13: 20 mg via INTRAVENOUS
  Filled 2020-10-13: qty 4

## 2020-10-13 MED ORDER — ENOXAPARIN SODIUM 40 MG/0.4ML ~~LOC~~ SOLN
40.0000 mg | SUBCUTANEOUS | Status: DC
  Administered 2020-10-14 – 2020-10-16 (×3): 40 mg via SUBCUTANEOUS
  Filled 2020-10-13 (×3): qty 0.4

## 2020-10-13 NOTE — Progress Notes (Signed)
PROGRESS NOTE    Melanie Huffman  CBJ:[id number] DOB: 1928/05/25 DOA: 10/10/2020 PCP: Melanie Body, MD    Brief Narrative:  Melanie Huffman is a 86 y.o. female with medical history significant for COPD, CHF, hypertension, anxiety disorder who was brought into the emergency room by EMS for evaluation of a fall  11/11-she underwent ORIF by Dr. Roland Rack on 11/10 Hemoglobin 6.9 this a.m.  11/12- no overnight issues. Hg 8.0 today. Daughter stated pt was confused a little this am. Ate some food. Also was given pain med this am  11/13-hemoglobin 6.8 today.  Patient is mentating well.  No overnight issues   Consultants:   orthopedics  Procedures:   Antimicrobials:       Subjective:  Sitting in chair, reports feeling week. No sob. Trying to drink some fluids  Objective: Vitals:   10/12/20 2037 10/12/20 2335 10/13/20 0500 10/13/20 0726  BP: (!) 159/66 (!) 149/64 (!) 153/62 (!) 164/91  Pulse: 85 91 91 87  Resp: 20 18 18 19   Temp: 98.8 F (37.1 C) 98 F (36.7 C) 98.7 F (37.1 C) 98.5 F (36.9 C)  TempSrc: Oral Oral Oral Oral  SpO2: 94% (!) 89% 92% 95%  Weight:      Height:        Intake/Output Summary (Last 24 hours) at 10/13/2020 0758 Last data filed at 10/12/2020 1820 Gross per 24 hour  Intake 1230 ml  Output 400 ml  Net 830 ml   Filed Weights   10/10/20 1233 10/10/20 1501  Weight: 51.3 kg 51.3 kg    Examination: Sitting in bed, nad DMM cta no w/r/r Regular s1/s2 no m Soft bening, +bs No edema Aaxox3, grossly intact   Data Reviewed: I have personally reviewed following labs and imaging studies  CBC: Recent Labs  Lab 10/10/20 1245 10/11/20 0602 10/11/20 2055 10/12/20 0444  WBC 9.9 8.6  --  12.6*  NEUTROABS 8.4*  --   --   --   HGB 10.7* 6.9* 7.9* 8.0*  HCT 31.8* 21.5*  --  23.5*  MCV 92.7 96.8  --  91.8  PLT 118* 93*  --  90*   Basic Metabolic Panel: Recent Labs  Lab 10/10/20 1245 10/11/20 0602 10/12/20 0444 10/13/20 0458  NA 135  131* 129* 129*  K 3.2* 5.0 4.8 4.8  CL 100 95* 93* 94*  CO2 27 29 28 29   GLUCOSE 152* 134* 134* 122*  BUN 7* 14 23 19   CREATININE 0.44 0.95 1.09* 0.65  CALCIUM 7.5* 8.3* 8.2* 8.3*   GFR: Estimated Creatinine Clearance: 37.9 mL/min (by C-G formula based on SCr of 0.65 mg/dL). Liver Function Tests: No results for input(s): AST, ALT, ALKPHOS, BILITOT, PROT, ALBUMIN in the last 168 hours. No results for input(s): LIPASE, AMYLASE in the last 168 hours. No results for input(s): AMMONIA in the last 168 hours. Coagulation Profile: No results for input(s): INR, PROTIME in the last 168 hours. Cardiac Enzymes: Recent Labs  Lab 10/10/20 1245  CKTOTAL 75   BNP (last 3 results) No results for input(s): PROBNP in the last 8760 hours. HbA1C: No results for input(s): HGBA1C in the last 72 hours. CBG: No results for input(s): GLUCAP in the last 168 hours. Lipid Profile: No results for input(s): CHOL, HDL, LDLCALC, TRIG, CHOLHDL, LDLDIRECT in the last 72 hours. Thyroid Function Tests: No results for input(s): TSH, T4TOTAL, FREET4, T3FREE, THYROIDAB in the last 72 hours. Anemia Panel: No results for input(s): VITAMINB12, FOLATE, FERRITIN, TIBC, IRON, RETICCTPCT in  the last 72 hours. Sepsis Labs: No results for input(s): PROCALCITON, LATICACIDVEN in the last 168 hours.  Recent Results (from the past 240 hour(s))  Respiratory Panel by RT PCR (Flu A&B, Covid) - Nasopharyngeal Swab     Status: None   Collection Time: 10/10/20  1:38 PM   Specimen: Nasopharyngeal Swab  Result Value Ref Range Status   SARS Coronavirus 2 by RT PCR NEGATIVE NEGATIVE Final    Comment: (NOTE) SARS-CoV-2 target nucleic acids are NOT DETECTED.  The SARS-CoV-2 RNA is generally detectable in upper respiratoy specimens during the acute phase of infection. The lowest concentration of SARS-CoV-2 viral copies this assay can detect is 131 copies/mL. A negative result does not preclude SARS-Cov-2 infection and should not  be used as the sole basis for treatment or other patient management decisions. A negative result may occur with  improper specimen collection/handling, submission of specimen other than nasopharyngeal swab, presence of viral mutation(s) within the areas targeted by this assay, and inadequate number of viral copies (<131 copies/mL). A negative result must be combined with clinical observations, patient history, and epidemiological information. The expected result is Negative.  Fact Sheet for Patients:  PinkCheek.be  Fact Sheet for Healthcare Providers:  GravelBags.it  This test is no t yet approved or cleared by the [place] FDA and  has been authorized for detection and/or diagnosis of SARS-CoV-2 by FDA under an Emergency Use Authorization (EUA). This EUA will remain  in effect (meaning this test can be used) for the duration of the COVID-19 declaration under Section 564(b)(1) of the Act, 21 U.S.C. section 360bbb-3(b)(1), unless the authorization is terminated or revoked sooner.     Influenza A by PCR NEGATIVE NEGATIVE Final   Influenza B by PCR NEGATIVE NEGATIVE Final    Comment: (NOTE) The Xpert Xpress SARS-CoV-2/FLU/RSV assay is intended as an aid in  the diagnosis of influenza from Nasopharyngeal swab specimens and  should not be used as a sole basis for treatment. Nasal washings and  aspirates are unacceptable for Xpert Xpress SARS-CoV-2/FLU/RSV  testing.  Fact Sheet for Patients: PinkCheek.be  Fact Sheet for Healthcare Providers: GravelBags.it  This test is not yet approved or cleared by the [place] FDA and  has been authorized for detection and/or diagnosis of SARS-CoV-2 by  FDA under an Emergency Use Authorization (EUA). This EUA will remain  in effect (meaning this test can be used) for the duration of the  Covid-19 declaration under Section  564(b)(1) of the Act, 21  U.S.C. section 360bbb-3(b)(1), unless the authorization is  terminated or revoked. Performed at [facility], [address], [place], [place] [address]          Radiology Studies: No results found.      Scheduled Meds: . vitamin C  500 mg Oral Daily  . calcium carbonate  1 tablet Oral Q breakfast   And  . cholecalciferol  400 Units Oral Daily  . docusate sodium  100 mg Oral BID  . enoxaparin (LOVENOX) injection  30 mg Subcutaneous Q24H  . escitalopram  10 mg Oral Daily  . feeding supplement  237 mL Oral BID BM  . umeclidinium bromide  1 puff Inhalation Daily   And  . fluticasone furoate-vilanterol  1 puff Inhalation Daily  . multivitamin with minerals   Oral Daily  . pantoprazole  40 mg Oral Daily  . raloxifene  60 mg Oral Daily   Continuous Infusions:   Assessment & Plan:   Principal Problem:  Fracture of femoral neck, right, closed ([place]) Active Problems:   Hypokalemia   HTN (hypertension)   Fall   Chronic systolic CHF (congestive heart failure) (HCC)   COPD (chronic obstructive pulmonary disease) with emphysema (HCC)   Thrombocytopenia (HCC)  Fracture of femoral neck-s/p fall Status post ORIF on 11/10 by Dr. Roland Rack  Pain control  Bowel regimen  PT recommended SNF/pending      Hypertension BP low post surgery, will discontinue blood pressure medications Gentle hydration, avoid volume overload 11/12-bp stable. Will d/c ivf.  Monitor bp, once elevated can add bp meds slowly  Post-op Anemia with acute blood loss-  Hg was 6.9 , S/p 1unit prbc on 11/11. 11/13- Hg 6.8 this am, will transfuse 1 unit prbc today Lasix 20mg  iv post transfusion Post transfusion h/h Ck occult blood to make sure bleed not from GI source     COPD with chronic respiratory failure Without acute exacerbation, chronically 3 L continuous O2 Continue inhalers    Depression  Continue Lexapro    Chronic systolic heart  failure Last known LVEF of 45% She is on dry side on exam, will give her gentle hydration, will give her Lasix and she is receiving 1 unit packed red blood cells Strict I's and O's Daily weight     Hypokalemia Secondary to diuretic use Was supplemented  K is 5.0, will discontinue all potassium supplementation to prevent further increase 11/12-K stable . Continue to monitor  Hyponatremia- Na 129- etiology unclear.  Has decrease po intake, was on ivf. Unsure possibly due to hypervolemia. On exam appears clinically dry Will give gentle hydration. Monitor volume status    Chronic thrombocytopenia Platelets close to baseline No signs of overt bleed. Continue to monitor    DVT prophylaxis: Lovenox Code Status: DNR Family Communication: Daughter at bedside  Status is: Inpatient  Remains inpatient appropriate because:Inpatient level of care appropriate due to severity of illness   Dispo: The patient is from: Home              Anticipated d/c is to: SNF              Anticipated d/c date is: 2 days              Patient currently is not medically stable to d/c.  Electrolyte imbalance, needs SNF placment, also Hg down getting transfusion          LOS: 3 days   Time spent: 35 minutes with more than 50% on [place], MD [facility] Pager [phone]  If 7PM-7AM, please contact night-coverage [email or web address] Password [facility] 10/13/2020, 7:58 AM

## 2020-10-13 NOTE — Progress Notes (Signed)
  Subjective: 3 Days Post-Op Procedure(s) (LRB): INTRAMEDULLARY (IM) NAIL INTERTROCHANTRIC (Right) Patient reports pain as improved from yesterday. Daughter at bedside. Patient is talkative today. Plan is to go Rehab after hospital stay. Awaiting approval for [facility]. Negative for chest pain and shortness of breath Fever: no Gastrointestinal: negative for nausea and vomiting.   Patient reports having a BM on Thursday, but this does not appear to be documented.   Objective: Vital signs in last 24 hours: Temp:  [98 F (36.7 C)-98.9 F (37.2 C)] 98.5 F (36.9 C) (11/13 0726) Pulse Rate:  [81-91] 87 (11/13 0726) Resp:  [18-20] 19 (11/13 0726) BP: (122-164)/(56-91) 164/91 (11/13 0726) SpO2:  [89 %-97 %] 95 % (11/13 0726)  Intake/Output from previous day:  Intake/Output Summary (Last 24 hours) at 10/13/2020 1031 Last data filed at 10/12/2020 1820 Gross per 24 hour  Intake 805 ml  Output 400 ml  Net 405 ml    Intake/Output this shift: No intake/output data recorded.  Labs: Recent Labs    10/10/20 1245 10/11/20 0602 10/11/20 2055 10/12/20 0444 10/13/20 0458  HGB 10.7* 6.9* 7.9* 8.0* 6.8*   Recent Labs    10/12/20 0444 10/13/20 0458  WBC 12.6* 9.1  RBC 2.56* 2.16*  HCT 23.5* 20.2*  PLT 90* 92*   Recent Labs    10/12/20 0444 10/13/20 0458  NA 129* 129*  K 4.8 4.8  CL 93* 94*  CO2 28 29  BUN 23 19  CREATININE 1.09* 0.65  GLUCOSE 134* 122*  CALCIUM 8.2* 8.3*   No results for input(s): LABPT, INR in the last 72 hours.   EXAM General - Patient is Alert, Appropriate and Oriented Extremity - Neurovascular intact Dorsiflexion/Plantar flexion intact Compartment soft Dressing/Incision -dried sanguinous drainage over the proximal dressing, mild dried sanguinous drainage over middle dressing, and mild dried serous drainage on gauze after distal dressing removed Motor Function - intact, moving foot and toes well on exam.  Gastrointestinal- soft, nontender and active  bowel sounds   Assessment/Plan: 3 Days Post-Op Procedure(s) (LRB): INTRAMEDULLARY (IM) NAIL INTERTROCHANTRIC (Right) Principal Problem:   Fracture of femoral neck, right, closed (HCC) Active Problems:   Hypokalemia   HTN (hypertension)   Fall   Chronic systolic CHF (congestive heart failure) (HCC)   COPD (chronic obstructive pulmonary disease) with emphysema (HCC)   Thrombocytopenia (HCC)  Estimated body mass index is 19.41 kg/m as calculated from the following:   Height as of this encounter: 5\' 4"  (1.626 m).   Weight as of this encounter: 51.3 kg. Advance diet Up with therapy Discharge to SNF pending approval. Will likely be here until Monday.   Honeycomb dressings were changed. Distal dressing reinforced with gauze, though minimal if any active drainage at this time.   DVT Prophylaxis - Lovenox, Ted hose and SCDs Weight-Bearing as tolerated to right leg  Cassell Smiles, PA-C [facility] 10/13/2020, 10:31 AM

## 2020-10-13 NOTE — Progress Notes (Signed)
PHARMACIST - PHYSICIAN COMMUNICATION  CONCERNING:  Enoxaparin (Lovenox) for DVT Prophylaxis    RECOMMENDATION: Patient was prescribed enoxaparin 30 mg q24 hours for VTE prophylaxis.   Filed Weights   10/10/20 1233 10/10/20 1501  Weight: 51.3 kg (113 lb) 51.3 kg (113 lb 1.5 oz)    Body mass index is 19.41 kg/m.  Estimated Creatinine Clearance: 37.9 mL/min (by C-G formula based on SCr of 0.65 mg/dL).  Patient is candidate for enoxaparin 40 mg every 24 hours based on CrCl >60ml/min  DESCRIPTION: Pharmacy has adjusted enoxaparin dose per [facility] policy.  Patient is now receiving enoxaparin 40 mg every 24 hours   Rocky Morel 10/13/2020 1:34 PM

## 2020-10-13 NOTE — TOC Progression Note (Addendum)
Transition of Care [facility]) - Progression Note    Patient Details  Name: SABRA SESSLER MRN: [id number] Date of Birth: 1928-09-30  Transition of Care [facility]) CM/SW Contact  Izola Price, RN Phone Number: 10/13/2020, 1:41 PM  Clinical Narrative:     11/13 Update 8003: Pt not stable enough, now getting blood, but insurance will accept when more stable per provider.   11/13 Call from [place] at [facility].  Fidela [place] # [phone]  Medical director feels patient too acute and requested peer to peer. Information given to provider. Simmie Davies RN CM .  11/13 Tammy from [place] [phone]) called Medical Director wants a peer-to-peer. Dr. Coralie Carpen [phone]. They request it before 10 am 11/14. Thanks. Simmie Davies RN CM   Expected Discharge Plan: Skilled Nursing Facility Barriers to Discharge: Continued Medical Work up, SNF Pending bed offer  Expected Discharge Plan and Services Expected Discharge Plan: [place] In-house Referral: Clinical Social Work   Post Acute Care Choice: [place] Living arrangements for the past 2 months: [place] ([place])                                       Social Determinants of Health ([place]) Interventions    Readmission Risk Interventions No flowsheet data found.

## 2020-10-13 NOTE — Progress Notes (Signed)
Physical Therapy Treatment Patient Details Name: Melanie Huffman MRN: [id number] DOB: 1928-05-21 Today's Date: 10/13/2020    History of Present Illness presented to ER secondary to mechanical fall with acute onset of R hip pain (down on floor x4 hours prior to recovery/assist); admitted for management of comminuted, displaced R proximal femur fracture, s/p IM nailing (10/10/20), PWB.    PT Comments    Pt was supine in bed finishing breakfast upon arriving. She is alert but disoriented. Noted low Hgb (6.8) this AM but no orders for transfusion at time of session. Elected to perform bed level there ex. Pt is extremely limited by pain and fatigue. Struggled with initiation of movements. Will need extensive rehab at DC. Recommend Dc to rehab to improve safe functional mobility. Per notes, may need long term placement.      Follow Up Recommendations  SNF     Equipment Recommendations  Other (comment) (defer to next level of care)    Recommendations for Other Services       Precautions / Restrictions Precautions Precautions: Fall Restrictions Weight Bearing Restrictions: Yes RLE Weight Bearing: Partial weight bearing    Mobility  Bed Mobility    General bed mobility comments: pain and low Hgb limiting OOB activity          Cognition Arousal/Alertness: Awake/alert Behavior During Therapy: Flat affect Overall Cognitive Status: No family/caregiver present to determine baseline cognitive functioning        General Comments: Pt noted with low Hgb. Elected for no OOB activity. performed ther ex in bed. pt very limited by pain. incocnsistently follows comamnds. poor ability to initiate movements on RLE.         General Comments General comments (skin integrity, edema, etc.): Pt has poor ability to tolerate ther ex in bed 2/2 to pain. She struggles to initiate movements throughout session.      Pertinent Vitals/Pain Pain Assessment: Faces Pain Score: 7  Faces Pain Scale:  Hurts whole lot Pain Location: R hip Pain Descriptors / Indicators: Aching;Guarding;Grimacing;Moaning Pain Intervention(s): Limited activity within patient's tolerance;Monitored during session;Premedicated before session;Repositioned           PT Goals (current goals can now be found in the care plan section) Acute Rehab PT Goals Patient Stated Goal: none stated Progress towards PT goals: Not progressing toward goals - comment (limited by pain/fatigue)    Frequency    7X/week      PT Plan Current plan remains appropriate       AM-PAC PT "6 Clicks" Mobility   Outcome Measure  Help needed turning from your back to your side while in a flat bed without using bedrails?: Total Help needed moving from lying on your back to sitting on the side of a flat bed without using bedrails?: Total Help needed moving to and from a bed to a chair (including a wheelchair)?: Total Help needed standing up from a chair using your arms (e.g., wheelchair or bedside chair)?: Total Help needed to walk in hospital room?: Total Help needed climbing 3-5 steps with a railing? : Total 6 Click Score: 6    End of Session   Activity Tolerance: Patient limited by pain;Other (comment);Patient limited by fatigue Patient left: in bed;with call bell/phone within reach;with bed alarm set;with family/visitor present Nurse Communication: Mobility status PT Visit Diagnosis: Muscle weakness (generalized) (M62.81);Difficulty in walking, not elsewhere classified (R26.2);Pain Pain - Right/Left: Right Pain - part of body: Hip;Knee     Time: 3785-8850 PT Time Calculation (min) (ACUTE  ONLY): 20 min  Charges:  $Therapeutic Exercise: 8-22 mins                     Julaine Fusi PTA 10/13/20, 3:51 PM

## 2020-10-14 ENCOUNTER — Inpatient Hospital Stay: Payer: PPO

## 2020-10-14 LAB — BPAM RBC
Blood Product Expiration Date: 202111132359
Blood Product Expiration Date: 202112152359
ISSUE DATE / TIME: 202111111510
ISSUE DATE / TIME: 202111131223
Unit Type and Rh: 5100
Unit Type and Rh: 9500

## 2020-10-14 LAB — BASIC METABOLIC PANEL
Anion gap: 8 (ref 5–15)
BUN: 13 mg/dL (ref 8–23)
CO2: 31 mmol/L (ref 22–32)
Calcium: 7.9 mg/dL — ABNORMAL LOW (ref 8.9–10.3)
Chloride: 87 mmol/L — ABNORMAL LOW (ref 98–111)
Creatinine, Ser: 0.59 mg/dL (ref 0.44–1.00)
GFR, Estimated: >60 mL/min (ref >60)
Glucose, Bld: 127 mg/dL — ABNORMAL HIGH (ref 70–99)
Potassium: 3.7 mmol/L (ref 3.5–5.1)
Sodium: 126 mmol/L — ABNORMAL LOW (ref 135–145)

## 2020-10-14 LAB — TYPE AND SCREEN
ABO/RH(D): O NEG
Antibody Screen: NEGATIVE
Unit division: 0
Unit division: 0

## 2020-10-14 LAB — URINALYSIS, COMPLETE (UACMP) WITH MICROSCOPIC
Bacteria, UA: NONE SEEN
Bilirubin Urine: NEGATIVE
Glucose, UA: NEGATIVE mg/dL
Hgb urine dipstick: NEGATIVE
Ketones, ur: NEGATIVE mg/dL
Leukocytes,Ua: NEGATIVE
Nitrite: NEGATIVE
Protein, ur: NEGATIVE mg/dL
Specific Gravity, Urine: 1.005 (ref 1.005–1.030)
pH: 6 (ref 5.0–8.0)

## 2020-10-14 LAB — CBC
HCT: 28.6 % — ABNORMAL LOW (ref 36.0–46.0)
Hemoglobin: 9.8 g/dL — ABNORMAL LOW (ref 12.0–15.0)
MCH: 30.9 pg (ref 26.0–34.0)
MCHC: 34.3 g/dL (ref 30.0–36.0)
MCV: 90.2 fL (ref 80.0–100.0)
Platelets: 97 10*3/uL — ABNORMAL LOW (ref 150–400)
RBC: 3.17 MIL/uL — ABNORMAL LOW (ref 3.87–5.11)
RDW: 13.6 % (ref 11.5–15.5)
WBC: 7.3 10*3/uL (ref 4.0–10.5)
nRBC: 0 % (ref 0.0–0.2)

## 2020-10-14 LAB — OCCULT BLOOD X 1 CARD TO LAB, STOOL: Fecal Occult Bld: NEGATIVE

## 2020-10-14 LAB — PROCALCITONIN: Procalcitonin: 0.18 ng/mL

## 2020-10-14 LAB — SODIUM: Sodium: 129 mmol/L — ABNORMAL LOW (ref 135–145)

## 2020-10-14 MED ORDER — FLEET ENEMA 7-19 GM/118ML RE ENEM
1.0000 | ENEMA | Freq: Once | RECTAL | Status: AC
  Administered 2020-10-14: 1 via RECTAL

## 2020-10-14 MED ORDER — FUROSEMIDE 10 MG/ML IJ SOLN
20.0000 mg | Freq: Once | INTRAMUSCULAR | Status: AC
  Administered 2020-10-14: 20 mg via INTRAVENOUS
  Filled 2020-10-14: qty 4

## 2020-10-14 MED ORDER — CARVEDILOL 3.125 MG PO TABS
3.1250 mg | ORAL_TABLET | Freq: Two times a day (BID) | ORAL | Status: DC
  Administered 2020-10-14 – 2020-10-15 (×2): 3.125 mg via ORAL
  Filled 2020-10-14 (×2): qty 1

## 2020-10-14 NOTE — Progress Notes (Addendum)
  Subjective: 4 Days Post-Op Procedure(s) (LRB): INTRAMEDULLARY (IM) NAIL INTERTROCHANTRIC (Right) Patient reports pain as significant. Patient went into respiratory distress yesterday and had fever; medicine working up. CXR showed no outright consolidation. Patient also underwent transfusion yesterday. Concern for possible occult bleed. Plan will be for SNF upon d/c. Negative for chest pain and shortness of breath Fever: not at present, but did yesterday evening    Objective: Vital signs in last 24 hours: Temp:  [97.5 F (36.4 C)-101.6 F (38.7 C)] 99.4 F (37.4 C) (11/14 0927) Pulse Rate:  [70-106] 80 (11/14 0927) Resp:  [15-27] 16 (11/14 0927) BP: (121-209)/(67-102) 121/68 (11/14 0927) SpO2:  [89 %-99 %] 98 % (11/14 0927)  Intake/Output from previous day:  Intake/Output Summary (Last 24 hours) at 10/14/2020 1005 Last data filed at 10/14/2020 0944 Gross per 24 hour  Intake 1166.8 ml  Output 3900 ml  Net -2733.2 ml    Intake/Output this shift: No intake/output data recorded.  Labs: Recent Labs    10/11/20 2055 10/12/20 0444 10/13/20 0458 10/14/20 0159  HGB 7.9* 8.0* 6.8* 9.8*   Recent Labs    10/13/20 0458 10/14/20 0159  WBC 9.1 7.3  RBC 2.16* 3.17*  HCT 20.2* 28.6*  PLT 92* 97*   Recent Labs    10/13/20 0458 10/14/20 0159  NA 129* 126*  K 4.8 3.7  CL 94* 87*  CO2 29 31  BUN 19 13  CREATININE 0.65 0.59  GLUCOSE 122* 127*  CALCIUM 8.3* 7.9*   No results for input(s): LABPT, INR in the last 72 hours.   EXAM General - Patient is Alert, Appropriate and Oriented Extremity - Neurovascular intact Compartment soft Dressing/Incision -moderate serosanguinous drainage noted to proximal and middle incisions, minimal drainage noted from distal incision Motor Function - intact, moving foot and toes well on exam. Not moving ankle today   Assessment/Plan: 4 Days Post-Op Procedure(s) (LRB): INTRAMEDULLARY (IM) NAIL INTERTROCHANTRIC (Right) Principal  Problem:   Fracture of femoral neck, right, closed (HCC) Active Problems:   Hypokalemia   HTN (hypertension)   Fall   Chronic systolic CHF (congestive heart failure) (HCC)   COPD (chronic obstructive pulmonary disease) with emphysema (HCC)   Thrombocytopenia (HCC)  Estimated body mass index is 19.41 kg/m as calculated from the following:   Height as of this encounter: 5\' 4"  (1.626 m).   Weight as of this encounter: 51.3 kg.  Patient is WBAT to RLE. Honeycomb dressings were changed, with compression gauze added to each. Distal most dressing wrapped with 6" ACE wrap.    DVT Prophylaxis - Lovenox, Ted hose and SCDs Weight-Bearing as tolerated to right leg  Cassell Smiles, PA-C [facility] Orthopaedic Surgery 10/14/2020, 10:05 AM

## 2020-10-14 NOTE — Progress Notes (Signed)
   10/14/20 0021 10/14/20 0052 10/14/20 0125  Assess: MEWS Score  Temp 98.4 F (36.9 C) 98.7 F (37.1 C) (!) 101.6 F (38.7 C)  BP (!) 209/102 (!) 198/93 (!) 150/92  Pulse Rate 90 (!) 106 91  Resp (!) 25 (!) 27 (!) 24  SpO2 (!) 89 % 97 % 97 %  O2 Device Nasal Cannula Nasal Cannula Nasal Cannula  Patient Activity (if Appropriate) In bed  --   --   O2 Flow Rate (L/min) 2 L/min 4 L/min 4 L/min

## 2020-10-14 NOTE — Progress Notes (Signed)
Physical Therapy Treatment Patient Details Name: Melanie Huffman MRN: [id number] DOB: 27-Oct-1928 Today's Date: 10/14/2020    History of Present Illness presented to ER secondary to mechanical fall with acute onset of R hip pain (down on floor x4 hours prior to recovery/assist); admitted for management of comminuted, displaced R proximal femur fracture, s/p IM nailing (10/10/20), PWB.    PT Comments    Pt asleep on first attempt.  Returned later and pt awake.  Stated she feels poorly and fatigued but wants to try ex.  AA/PROM R hip to tolerance.  Deferred OOB mobility per pt request.  Sats 99% on 4 lpm at rest and with ex but rattles audible.     Follow Up Recommendations  SNF     Equipment Recommendations       Recommendations for Other Services       Precautions / Restrictions Precautions Precautions: Fall Restrictions Weight Bearing Restrictions: Yes RLE Weight Bearing: Partial weight bearing    Mobility  Bed Mobility               General bed mobility comments: deferred  Transfers                    Ambulation/Gait                 Stairs             Wheelchair Mobility    Modified Rankin (Stroke Patients Only)       Balance                                            Cognition Arousal/Alertness: Awake/alert Behavior During Therapy: WFL for tasks assessed/performed Overall Cognitive Status: Within Functional Limits for tasks assessed                                        Exercises Other Exercises Other Exercises: Supine R LE therex, 1x10, act assist: ankle pumps, heel slides, hip adduct/abduct.  Slightly more movement throughout all ranges, less guarding this date    General Comments        Pertinent Vitals/Pain Pain Assessment: Faces Faces Pain Scale: Hurts a little bit Pain Location: R hip - generally comfortable with exercises Pain Descriptors / Indicators: Guarding;Sore Pain  Intervention(s): Limited activity within patient's tolerance;Monitored during session;Premedicated before session;Repositioned    Home Living                      Prior Function            PT Goals (current goals can now be found in the care plan section) Progress towards PT goals: Progressing toward goals    Frequency    7X/week      PT Plan Current plan remains appropriate    Co-evaluation              AM-PAC PT "6 Clicks" Mobility   Outcome Measure  Help needed turning from your back to your side while in a flat bed without using bedrails?: Total Help needed moving from lying on your back to sitting on the side of a flat bed without using bedrails?: Total Help needed moving to and from a bed to a chair (including a wheelchair)?: Total Help  needed standing up from a chair using your arms (e.g., wheelchair or bedside chair)?: Total Help needed to walk in hospital room?: Total Help needed climbing 3-5 steps with a railing? : Total 6 Click Score: 6    End of Session   Activity Tolerance: Patient tolerated treatment well;Patient limited by fatigue Patient left: in bed;with call bell/phone within reach;with bed alarm set Nurse Communication: Mobility status Pain - Right/Left: Right Pain - part of body: Hip;Knee     Time: 1012-1020 PT Time Calculation (min) (ACUTE ONLY): 8 min  Charges:  $Therapeutic Exercise: 8-22 mins                    Chesley Noon, PTA 10/14/20, 10:30 AM

## 2020-10-14 NOTE — Progress Notes (Addendum)
PROGRESS NOTE    Melanie Huffman  FWY:[id number] DOB: August 27, 1928 DOA: 10/10/2020 PCP: Dion Body, MD    Brief Narrative:  Melanie Huffman is a 86 y.o. female with medical history significant for COPD, CHF, hypertension, anxiety disorder who was brought into the emergency room by EMS for evaluation of a fall  11/11-she underwent ORIF by Dr. Roland Rack on 11/10 Hemoglobin 6.9 this a.m.  11/12- no overnight issues. Hg 8.0 today. Daughter stated pt was confused a little this am. Ate some food. Also was given pain med this am  11/13-hemoglobin 6.8 today.  Patient is mentating well.  No overnight issues   11/14-patient has urinary retention, Foley was placed per response was initiated due to patient being in acute respiratory distress.  Patient sat dropped to low 80s and was noted to be hypotensive, tachycardia and tachypneic.  She also spiked fever 101.6  Consultants:   orthopedics  Procedures:   Antimicrobials:       Subjective: Pt appears weak/ denies sob, cp, abdominal pain. Tmax 101.6.    Objective: Vitals:   10/14/20 0052 10/14/20 0125 10/14/20 0328 10/14/20 0836  BP: (!) 198/93 (!) 150/92 (!) 144/70 122/67  Pulse: (!) 106 91  78  Resp: (!) 27 (!) 24 (!) 22 16  Temp: 98.7 F (37.1 C) (!) 101.6 F (38.7 C) (!) 97.5 F (36.4 C) 99.7 F (37.6 C)  TempSrc: Axillary Oral Oral Oral  SpO2: 97% 97%  99%  Weight:      Height:        Intake/Output Summary (Last 24 hours) at 10/14/2020 0856 Last data filed at 10/14/2020 2774 Gross per 24 hour  Intake 1166.8 ml  Output 3900 ml  Net -2733.2 ml   Filed Weights   10/10/20 1233 10/10/20 1501  Weight: 51.3 kg 51.3 kg    Examination: Appears tired/weak cta , no w/r/r Regular s1/s2 no murmurs No edema  aaxox3 Mood and affect appropriate in current setting   Data Reviewed: I have personally reviewed following labs and imaging studies  CBC: Recent Labs  Lab 10/10/20 1245 10/10/20 1245 10/11/20 0602  10/11/20 2055 10/12/20 0444 10/13/20 0458 10/14/20 0159  WBC 9.9  --  8.6  --  12.6* 9.1 7.3  NEUTROABS 8.4*  --   --   --   --   --   --   HGB 10.7*   < > 6.9* 7.9* 8.0* 6.8* 9.8*  HCT 31.8*  --  21.5*  --  23.5* 20.2* 28.6*  MCV 92.7  --  96.8  --  91.8 93.5 90.2  PLT 118*  --  93*  --  90* 92* 97*   < > = values in this interval not displayed.   Basic Metabolic Panel: Recent Labs  Lab 10/10/20 1245 10/11/20 0602 10/12/20 0444 10/13/20 0458 10/14/20 0159  NA 135 131* 129* 129* 126*  K 3.2* 5.0 4.8 4.8 3.7  CL 100 95* 93* 94* 87*  CO2 27 29 28 29 31   GLUCOSE 152* 134* 134* 122* 127*  BUN 7* 14 23 19 13   CREATININE 0.44 0.95 1.09* 0.65 0.59  CALCIUM 7.5* 8.3* 8.2* 8.3* 7.9*   GFR: Estimated Creatinine Clearance: 37.9 mL/min (by C-G formula based on SCr of 0.59 mg/dL). Liver Function Tests: No results for input(s): AST, ALT, ALKPHOS, BILITOT, PROT, ALBUMIN in the last 168 hours. No results for input(s): LIPASE, AMYLASE in the last 168 hours. No results for input(s): AMMONIA in the last 168 hours. Coagulation Profile:  No results for input(s): INR, PROTIME in the last 168 hours. Cardiac Enzymes: Recent Labs  Lab 10/10/20 1245  CKTOTAL 75   BNP (last 3 results) No results for input(s): PROBNP in the last 8760 hours. HbA1C: No results for input(s): HGBA1C in the last 72 hours. CBG: No results for input(s): GLUCAP in the last 168 hours. Lipid Profile: No results for input(s): CHOL, HDL, LDLCALC, TRIG, CHOLHDL, LDLDIRECT in the last 72 hours. Thyroid Function Tests: No results for input(s): TSH, T4TOTAL, FREET4, T3FREE, THYROIDAB in the last 72 hours. Anemia Panel: No results for input(s): VITAMINB12, FOLATE, FERRITIN, TIBC, IRON, RETICCTPCT in the last 72 hours. Sepsis Labs: Recent Labs  Lab 10/14/20 0159  PROCALCITON 0.18    Recent Results (from the past 240 hour(s))  Respiratory Panel by RT PCR (Flu A&B, Covid) - Nasopharyngeal Swab     Status: None    Collection Time: 10/10/20  1:38 PM   Specimen: Nasopharyngeal Swab  Result Value Ref Range Status   SARS Coronavirus 2 by RT PCR NEGATIVE NEGATIVE Final    Comment: (NOTE) SARS-CoV-2 target nucleic acids are NOT DETECTED.  The SARS-CoV-2 RNA is generally detectable in upper respiratoy specimens during the acute phase of infection. The lowest concentration of SARS-CoV-2 viral copies this assay can detect is 131 copies/mL. A negative result does not preclude SARS-Cov-2 infection and should not be used as the sole basis for treatment or other patient management decisions. A negative result may occur with  improper specimen collection/handling, submission of specimen other than nasopharyngeal swab, presence of viral mutation(s) within the areas targeted by this assay, and inadequate number of viral copies (<131 copies/mL). A negative result must be combined with clinical observations, patient history, and epidemiological information. The expected result is Negative.  Fact Sheet for Patients:  PinkCheek.be  Fact Sheet for Healthcare Providers:  GravelBags.it  This test is no t yet approved or cleared by the [place] FDA and  has been authorized for detection and/or diagnosis of SARS-CoV-2 by FDA under an Emergency Use Authorization (EUA). This EUA will remain  in effect (meaning this test can be used) for the duration of the COVID-19 declaration under Section 564(b)(1) of the Act, 21 U.S.C. section 360bbb-3(b)(1), unless the authorization is terminated or revoked sooner.     Influenza A by PCR NEGATIVE NEGATIVE Final   Influenza B by PCR NEGATIVE NEGATIVE Final    Comment: (NOTE) The Xpert Xpress SARS-CoV-2/FLU/RSV assay is intended as an aid in  the diagnosis of influenza from Nasopharyngeal swab specimens and  should not be used as a sole basis for treatment. Nasal washings and  aspirates are unacceptable for Xpert  Xpress SARS-CoV-2/FLU/RSV  testing.  Fact Sheet for Patients: PinkCheek.be  Fact Sheet for Healthcare Providers: GravelBags.it  This test is not yet approved or cleared by the [place] FDA and  has been authorized for detection and/or diagnosis of SARS-CoV-2 by  FDA under an Emergency Use Authorization (EUA). This EUA will remain  in effect (meaning this test can be used) for the duration of the  Covid-19 declaration under Section 564(b)(1) of the Act, 21  U.S.C. section 360bbb-3(b)(1), unless the authorization is  terminated or revoked. Performed at [facility], [address] [place], [place] [address]   CULTURE, BLOOD (ROUTINE X 2) w Reflex to ID Panel     Status: None (Preliminary result)   Collection Time: 10/14/20  2:05 AM   Specimen: BLOOD  Result Value Ref Range Status  Specimen Description BLOOD BOTTLES DRAWN AEROBIC AND ANAEROBIC  Final   Special Requests ARM Blood Culture adequate volume  Final   Culture   Final    NO GROWTH < 12 HOURS Performed at [facility], [address] [place], [place] [address]    Report Status PENDING  Incomplete  CULTURE, BLOOD (ROUTINE X 2) w Reflex to ID Panel     Status: None (Preliminary result)   Collection Time: 10/14/20  2:05 AM   Specimen: BLOOD  Result Value Ref Range Status   Specimen Description BLOOD BOTTLES DRAWN AEROBIC AND ANAEROBIC  Final   Special Requests LEFT ANTECUBITAL Blood Culture adequate volume  Final   Culture   Final    NO GROWTH < 12 HOURS Performed at [facility], [address], [place], [place] [address]    Report Status PENDING  Incomplete         Radiology Studies: DG Chest 1 View  Result Date: 10/14/2020 CLINICAL DATA:  Congestive heart failure EXAM: CHEST  1 VIEW COMPARISON:  10/10/2020 FINDINGS: Slightly increased interstitial opacity bilaterally. No pleural effusion. Mild cardiomegaly.  IMPRESSION: Cardiomegaly with slightly increased interstitial opacity, but no overt pulmonary edema. Electronically Signed   By: Ulyses Jarred M.D.   On: 10/14/2020 02:07        Scheduled Meds: . sodium chloride   Intravenous Once  . vitamin C  500 mg Oral Daily  . calcium carbonate  1 tablet Oral Q breakfast   And  . cholecalciferol  400 Units Oral Daily  . carvedilol  3.125 mg Oral BID WC  . docusate sodium  100 mg Oral BID  . enoxaparin (LOVENOX) injection  40 mg Subcutaneous Q24H  . escitalopram  10 mg Oral Daily  . feeding supplement  237 mL Oral BID BM  . umeclidinium bromide  1 puff Inhalation Daily   And  . fluticasone furoate-vilanterol  1 puff Inhalation Daily  . multivitamin with minerals   Oral Daily  . pantoprazole  40 mg Oral Daily  . raloxifene  60 mg Oral Daily   Continuous Infusions:   Assessment & Plan:   Principal Problem:   Fracture of femoral neck, right, closed ([place]) Active Problems:   Hypokalemia   HTN (hypertension)   Fall   Chronic systolic CHF (congestive heart failure) (HCC)   COPD (chronic obstructive pulmonary disease) with emphysema (HCC)   Thrombocytopenia (HCC)  Fracture of femoral neck-s/p fall Status post ORIF on 11/10 by Dr. Roland Rack  Pain control  Bowel regimen  PT recommends SNF    Fever -Tmax 101.6.  No leukocytosis Blood cultures pending UA negative Chest x-ray no pneumonia Possibly due to postop versus she did receive blood transfusion yesterday We will continue to monitor   Acute respiratory discharge- likely 2/2 fluid overload.  Another lasix 20 iv given. This am clinically improved Continue to monitor I/o Daily weight  Acute /Chronic systolic heart failure Last known LVEF of 45% 11/14- overnight likely volume overloaded, given lasix, with improvement this am. cxr obtained , no overt pulmonary edema. Likely 2/2 transfusion Continue Lasix as needed as clinically indicated  strict I's and O's  Daily weight     Hypertension BP low post surgery, will discontinue blood pressure medications Gentle hydration, avoid volume overloa 11/14- starting to increase, will add coreg 3.125 bid with parameters   Post-op Anemia with acute blood loss-  Hg was 6.9 , S/p 1unit prbc on 11/11. 11/13- Hg 6.8 this am, will transfuse  1 unit prbc today Lasix 20mg  iv post transfusion 11/14-hemoglobin 9.8 Stool occult pending discussed this with nursing to collect     COPD with chronic respiratory failure Without acute exacerbation, chronically 3 L continuous O2 Continue inhalers    Depression  Continue Lexapro    Chronic systolic heart failure Last known LVEF of 45% She is on dry side on exam, will give her gentle hydration, will give her Lasix and she is receiving 1 unit packed red blood cells Strict I's and O's Daily weight     Hypokalemia Secondary to diuretic use Was supplemented  K is 5.0, will discontinue all potassium supplementation to prevent further increase 11/12-K stable . Continue to monitor  Hyponatremia- Na 129- etiology unclear.  Has decrease po intake, was on ivf. Unsure possibly due to hypervolemia. Na 126 very early morning, was given lasix when went int acute respiratory distress. Will reck Na level.    Chronic thrombocytopenia Platelets close to baseline No signs of overt bleed. Continue to monitor    DVT prophylaxis: Lovenox Code Status: DNR Family Communication: Called daughter and updated her about patient's status and findings.  Also discuss about consulting palliative care to set goals of care she was agreeable to this  Status is: Inpatient  Remains inpatient appropriate because:Inpatient level of care appropriate due to severity of illness   Dispo: The patient is from: Home              Anticipated d/c is to: SNF              Anticipated d/c date is: 2 days              Patient currently is not medically stable to d/c.  Electrolyte imbalance,  needs SNF placment, acute respiratory distress this am.          LOS: 4 days   Time spent: 45 minutes with more than 50% on [place], MD [facility] Pager [phone]  If 7PM-7AM, please contact night-coverage [email or web address] Password [facility] 10/14/2020, 8:56 AM

## 2020-10-14 NOTE — TOC Progression Note (Addendum)
Transition of Care [facility]) - Progression Note    Patient Details  Name: Melanie Huffman MRN: [id number] Date of Birth: 1928/10/29  Transition of Care [facility]) CM/SW Contact  Dnasia Gauna, Nonda Lou, [place] Phone Number: 10/14/2020, 10:57 AM  Clinical Narrative:   TOC Team follow up. Discharge plan- SNF: [facility] when medically stable. Pt. Continues to work with PT. Will continue to monitor. 1530- Delivered denial letter to pt. Pt. Is currently sleeping. Call to [facility]- [phone]. Left message for Mr. Amedeo Plenty (spouse) to have Foye return call. 1345- Call to Tammy ([facility] [facility]) to share that the denial letter has been delivered. Plan- to continue to monitor for discharge planning. 1615- Received a call from daughterLizbett Garciagarcia. Discussed the denial letter. She expressed understanding. Since the fenial daughter is requesting FL-2 be sent to [facility] [address] [place], [place] [address] Tel:[phone] a facility closer to her. She voices the contact person is [place].   Expected Discharge Plan: [place] Barriers to Discharge: Continued Medical Work up, SNF Pending bed offer  Expected Discharge Plan and Services Expected Discharge Plan: [place] In-house Referral: Clinical Social Work   Post Acute Care Choice: [place] Living arrangements for the past 2 months: [place] ([place])                                       Social Determinants of Health ([place]) Interventions    Readmission Risk Interventions No flowsheet data found.

## 2020-10-14 NOTE — Progress Notes (Addendum)
    BRIEF OVERNIGHT PROGRESS REPORT  SUBJECTIVE: Rapid response initiated for acute respiratory distress. Per primary RN, patient's sats dropped in the low 80s, she was noted to be hypertensive, tachycardic and tachypneic. She also spiked a fever of 101.6.  OBJECTIVE:On arrival to the bedside, she was febrile (1016) with blood pressure 198/93 previously 209/102 mm Hg and pulse rate 106 beats/min. There were no focal neurological deficits; she was alert and oriented to self but lethargic. Patient did not demonstrate increase work of breathing. Patient retaining urine >500 cc despite 400 cc output on canister.   ASSESSMENT & PLAN: 86 year old female with PMH significant for COPD, CHF, HTN, and anxiety disorder admitted on 11/10 with  closed comminuted displaced right subtrochanteric femur fracture due to mechanical fall status post repair POD # 4 now complicated by acute hypoxic respiratory failure and fever.   Acute Hypoxic Respiratory Failure secondary to possible pneumonia?  -COPD without evidence of acute exacerbation -Supplemental O2 as needed to maintain O2 saturations 88 to 92% -Follow intermittent ABG and chest x-ray as needed -Repeat CXR on 11/14 with slight increased interstitial opacity; currently no wheezing noted upon auscultation -IV Lasix 20 mg IV x 1 -As needed bronchodilators  Fever - Possible pneumonia versus post -op?  PCT 0.18. No leukocytosis. Will monitor closely off antibiotics with cultures pending.  -Chest xray as above -Obtain blood and urine cultures -Trend procalcitonin -Monitor fever curve      Rufina Falco, BSN, MSN, DNP, [employer]  [facility] Nurse Practitioner  [facility]

## 2020-10-14 NOTE — Progress Notes (Signed)
Rapid Response Event Note    Reason for Call : Call received from charge RN regarding patients increased oxygen demand / decreased o2 saturation.     Initial Focused Assessment: On arrival patient alert, previously placed on 6L nasal cannula 02 sats 100%. Patient has crackles throughout. B/P-198/93, Bladder shows 522ml        Interventions:  Rufina Falco NP notified, orders received for indwelling foley, lasix IV push and chest x-ray.  Plan of Care:   Patient to remain in room for now, interventions performed.     Event Summary:   MD Notified:  Call Time:  0058 Arrival Time: 0105 End Time: 1610  Johnathan Hausen, RN

## 2020-10-15 ENCOUNTER — Other Ambulatory Visit: Payer: Self-pay | Admitting: Nurse Practitioner

## 2020-10-15 DIAGNOSIS — Z515 Encounter for palliative care: Secondary | ICD-10-CM

## 2020-10-15 DIAGNOSIS — J449 Chronic obstructive pulmonary disease, unspecified: Secondary | ICD-10-CM

## 2020-10-15 DIAGNOSIS — I5022 Chronic systolic (congestive) heart failure: Secondary | ICD-10-CM

## 2020-10-15 DIAGNOSIS — Z7189 Other specified counseling: Secondary | ICD-10-CM

## 2020-10-15 DIAGNOSIS — S72001K Fracture of unspecified part of neck of right femur, subsequent encounter for closed fracture with nonunion: Secondary | ICD-10-CM

## 2020-10-15 LAB — URINE CULTURE: Culture: NO GROWTH

## 2020-10-15 LAB — HEMOGLOBIN: Hemoglobin: 8.6 g/dL — ABNORMAL LOW (ref 12.0–15.0)

## 2020-10-15 LAB — PROCALCITONIN: Procalcitonin: 0.34 ng/mL

## 2020-10-15 MED ORDER — CHLORHEXIDINE GLUCONATE CLOTH 2 % EX PADS: 6.0000 | MEDICATED_PAD | Freq: Every day | CUTANEOUS | Status: DC

## 2020-10-15 MED ORDER — CARVEDILOL 3.125 MG PO TABS
6.2500 mg | ORAL_TABLET | Freq: Two times a day (BID) | ORAL | Status: DC
  Administered 2020-10-15 – 2020-10-16 (×2): 6.25 mg via ORAL
  Filled 2020-10-15 (×2): qty 2

## 2020-10-15 MED ORDER — FUROSEMIDE 10 MG/ML IJ SOLN
20.0000 mg | Freq: Once | INTRAMUSCULAR | Status: AC
  Administered 2020-10-15: 20 mg via INTRAVENOUS
  Filled 2020-10-15: qty 4

## 2020-10-15 NOTE — TOC Progression Note (Signed)
Transition of Care [facility]) - Progression Note    Patient Details  Name: Melanie Huffman MRN: [id number] Date of Birth: 12-Jun-1928  Transition of Care [facility]) CM/SW [place], RN Phone Number: 10/15/2020, 1:54 PM  Clinical Narrative:   RNCM spoke with patient's daughter off and on throughout the morning. Daughter, [place] has ultimately decided she wants her mother to go to [facility] in [place]. Per attending patient is medically stable to start insurance authorization. RNCM reached out to [facility] and started insurance authorization.     Expected Discharge Plan: [place] Barriers to Discharge: Continued Medical Work up, SNF Pending bed offer  Expected Discharge Plan and Services Expected Discharge Plan: [place] In-house Referral: Clinical Social Work   Post Acute Care Choice: [place] Living arrangements for the past 2 months: [place])                                       Social Determinants of Health ([place]) Interventions    Readmission Risk Interventions No flowsheet data found.

## 2020-10-15 NOTE — Consult Note (Addendum)
Consultation Note Date: 10/15/2020   Patient Name: Melanie Huffman  DOB: 1927-12-28  MRN: [id number]  Age / Sex: 86 y.o., female  PCP: Dion Body, MD Referring Physician: Nolberto Hanlon, MD  Reason for Consultation: Establishing goals of care  HPI/Patient Profile: 86 y.o. female  with past medical history of COPD, CHF last echo showed EF of 45%, admitted on 10/10/2020 with a fall resulting in a hip fracture, she underwent surgery on November 10.  She is participating with physical therapy.  She had a momentary decent compensation on November 14 with some respiratory distress, however this was felt to be due to volume overload from blood transfusion and resolved quickly with administration of Lasix.  Clinical Assessment and Goals of Care:  I have reviewed medical records including EPIC notes, labs and imaging, examined the patient and met at bedside with the patient's daughter Matthies, who is also patient's healthcare power of attorney to discuss diagnosis prognosis, GOC, EOL wishes, disposition and options.  Patient was sleeping, and did not awaken for our discussion, her daughter is healthcare power of attorney, and felt comfortable relaying patient's wishes.  I introduced Palliative Medicine as specialized medical care for people living with serious illness. It focuses on providing relief from the symptoms and stress of a serious illness.   We discussed a brief life review of the patient.  She is from [place], her daughter Wiswell lives in [place].  She has been residing at [facility] in [place], where she enjoys a great amount of social interaction.  She is also of [place] and has 2 churches that she is involved in.  As far as functional and nutritional status-prior to admission she was living independently, independent with all ADLs, and that there have been no changes in her nutritional  status.  We discussed her current illness and what it means in the larger context of her on-going co-morbidities.  Natural disease trajectory and expectations at EOL were discussed.  Cremeans shares concerns that Ms. Swiss will not be able to return to her prior independent state of living and knows that sometimes a hip fracture in our elderly patients can be the beginning of more difficulties.  Bitton notes that patient is DNR and made that choice for herself, she has also made all preparations in the event of her death.  Kasinger is struggling because she lives a lengthy drive away, and it would be easier for her if Dakiyah was living closer to her however, [place] does not want to move [place] away from all of her social support.  Advanced directives, concepts specific to code status, artifical feeding and hydration, and rehospitalization were considered and discussed.  A MOST form was completed electronically in [facility]-selections for DNR limited additional interventions IV fluids and artificial feeding for a limited trial and antibiotics if needed were made.  We discussed that this form can always be changed and selections may change based on how patient's health progresses or declines.  Hospice and Palliative Care services outpatient were explained and  offered.  [place] is agreeable for outpatient palliative care to follow-up with her mom.  Questions and concerns were addressed.  The family was encouraged to call with questions or concerns.   Primary Decision Maker HCPOA- patient's daughter- Lolamae Voisin    SUMMARY OF RECOMMENDATIONS -Patient is DNR -Continue full scope care -MOST form completed electronically and is on chart -Hopeful for discharge to rehab and eventual return to her previous independent living however it is reasonable that patient will likely need an assisted living level of care after rehab -We will refer for outpatient palliative to continue to follow  Code  Status/Advance Care Planning:  DNR  Prognosis:    Unable to determine  Discharge Planning: [facility] for rehab with Palliative care service follow-up  Primary Diagnoses: Present on Admission: . Fracture of femoral neck, right, closed ([facility]) . HTN (hypertension) . Hypokalemia . Fall . Chronic systolic CHF (congestive heart failure) ([facility]) . COPD (chronic obstructive pulmonary disease) with emphysema ([facility] [facility]) . Thrombocytopenia ([place] [facility])   I have reviewed the medical record, interviewed the patient and family, and examined the patient. The following aspects are pertinent.  Past Medical History:  Diagnosis Date  . Anxiety   . Arthritis 2019   mostly in neck  . Asthma   . Breast cancer ([facility]) 1991   mastectomy left  . Cancer ([facility]) 1991   L Breast  . CHF (congestive heart failure) ([facility])   . COPD (chronic obstructive pulmonary disease) ([facility])    hypoxia with resp failure admission in 03/2018  . Dementia ([facility])    confusion beginning  . Emphysema of lung ([facility])   . GERD (gastroesophageal reflux disease)   . Heart murmur   . History of hiatal hernia   . Hypertension 03/2018   recent hypertension urgency ..admitted in april 2019  . Hypoxia 03/2018   O2 saturation runs 86-91% on Room Air.  . Intermittent dysphagia   . Peripheral vascular disease ([facility])   . Shortness of breath dyspnea   . Traumatic subdural hematoma ([facility])    Social History   Socioeconomic History  . Marital status: Widowed    Spouse name: Not on file  . Number of children: 1  . Years of education: 37  . Highest education level: Associate degree: occupational, Hotel manager, or vocational program  Occupational History  . Occupation: retired  Tobacco Use  . Smoking status: Former Smoker    Packs/day: 0.50    Years: 20.00    Pack years: 10.00    Types: Cigarettes    Quit date: 01/26/1979    Years since quitting: 41.7  . Smokeless tobacco: Never Used  Vaping Use  . Vaping Use: Never used  Substance  and Sexual Activity  . Alcohol use: No  . Drug use: No  . Sexual activity: Not Currently  Other Topics Concern  . Not on file  Social History Narrative  . Not on file   Social Determinants of Health   Financial Resource Strain:   . Difficulty of Paying Living Expenses: Not on file  Food Insecurity:   . Worried About Charity fundraiser in the Last Year: Not on file  . Ran Out of Food in the Last Year: Not on file  Transportation Needs:   . Lack of Transportation (Medical): Not on file  . Lack of Transportation (Non-Medical): Not on file  Physical Activity:   . Days of Exercise per Week: Not on file  . Minutes of Exercise per Session: Not on file  Stress:   . Feeling of Stress : Not on file  Social Connections:   . Frequency of Communication with Friends and Family: Not on file  . Frequency of Social Gatherings with Friends and Family: Not on file  . Attends Religious Services: Not on file  . Active Member of Clubs or Organizations: Not on file  . Attends Archivist Meetings: Not on file  . Marital Status: Not on file   Scheduled Meds: . sodium chloride   Intravenous Once  . vitamin C  500 mg Oral Daily  . calcium carbonate  1 tablet Oral Q breakfast   And  . cholecalciferol  400 Units Oral Daily  . carvedilol  3.125 mg Oral BID WC  . docusate sodium  100 mg Oral BID  . enoxaparin (LOVENOX) injection  40 mg Subcutaneous Q24H  . escitalopram  10 mg Oral Daily  . feeding supplement  237 mL Oral BID BM  . umeclidinium bromide  1 puff Inhalation Daily   And  . fluticasone furoate-vilanterol  1 puff Inhalation Daily  . multivitamin with minerals   Oral Daily  . pantoprazole  40 mg Oral Daily  . raloxifene  60 mg Oral Daily   Continuous Infusions: PRN Meds:.acetaminophen, albuterol, ALPRAZolam, bisacodyl, diphenhydrAMINE, HYDROcodone-acetaminophen, magnesium hydroxide, metoCLOPramide **OR** metoCLOPramide (REGLAN) injection, morphine injection, ondansetron **OR**  ondansetron (ZOFRAN) IV, sodium phosphate, traMADol Medications Prior to Admission:  Prior to Admission medications   Medication Sig Start Date End Date Taking? Authorizing Provider  ALPRAZolam (XANAX) 0.25 MG tablet Take 0.25 mg by mouth at bedtime as needed. 08/28/20  Yes [provider]  Calcium Carbonate-Vitamin D3 (CALCIUM 600/VITAMIN D) 600-400 MG-UNIT TABS Take 1 tablet by mouth daily.   Yes [provider]  carvedilol (COREG) 25 MG tablet Take 25 mg by mouth 2 (two) times daily with a meal.   Yes [provider]  cloNIDine (CATAPRES) 0.1 MG tablet Take 0.1 mg by mouth 2 (two) times daily.  07/20/18  Yes [provider]  escitalopram (LEXAPRO) 10 MG tablet Take 10 mg by mouth daily.   Yes [provider]  furosemide (LASIX) 40 MG tablet Take 40 mg by mouth daily. And additional 95m PRN    Yes [provider]  Multiple Vitamins-Minerals (CENTRUM SILVER PO) Take 1 tablet by mouth daily.   Yes [provider]  omeprazole (PRILOSEC) 20 MG capsule Take 20 mg by mouth daily.    Yes [provider]  potassium chloride SA (K-DUR,KLOR-CON) 20 MEQ tablet Take 1 tablet (20 mEq total) by mouth daily. Patient taking differently: Take 20 mEq by mouth 2 (two) times daily.  03/09/18  Yes Pyreddy, PReatha Harps MD  raloxifene (EVISTA) 60 MG tablet Take 60 mg by mouth daily.   Yes [provider]  ramipril (ALTACE) 10 MG capsule Take 20 mg by mouth every morning.   Yes [provider]  TRELEGY ELLIPTA 100-62.5-25 MCG/INH AEPB Inhale 1 puff into the lungs daily. 09/19/20  Yes [provider]  vitamin C (ASCORBIC ACID) 500 MG tablet Take 500 mg by mouth daily.   Yes [provider]  acetaminophen (TYLENOL) 325 MG tablet Take 2 tablets (650 mg total) by mouth every 6 (six) hours as needed for mild pain (or Fever >/= 101). 06/09/18   WLoletha Grayer MD  albuterol (PROVENTIL HFA;VENTOLIN HFA) 108 (90 Base) MCG/ACT  inhaler Inhale 2 puffs into the lungs every 6 (six) hours as needed for wheezing or shortness of breath.  [provider]  enoxaparin (LOVENOX) 40 MG/0.4ML injection Inject 0.4 mLs (40 mg total) into the skin daily. 10/12/20   Lattie Corns, PA-C  hydrALAZINE (APRESOLINE) 25 MG tablet Take 25 mg by mouth as directed. 09/19/20   [provider]  Psyllium (METAMUCIL FIBER PO) Take 5 capsules by mouth 3 (three) times daily as needed.     [provider]  traMADol (ULTRAM) 50 MG tablet Take 1 tablet (50 mg total) by mouth every 6 (six) hours as needed for moderate pain. 10/12/20   Lattie Corns, PA-C   Allergies  Allergen Reactions  . Amlodipine Swelling     [Onset: 08/26/2013]feet and neck begin to swell  . Adhesive [Tape] Other (See Comments)    Skin is very thin and tears easily. Please use paper tape only  . Codeine Nausea Only  . Percocet [Oxycodone-Acetaminophen] Nausea And Vomiting    Patient took on an empty stomach. Good relief but unsettled. Still has not eaten today  . Sulfa Antibiotics Nausea Only and Rash    rash   Review of Systems  Unable to perform ROS: Other    Physical Exam Vitals and nursing note reviewed.  Constitutional:      General: She is not in acute distress.    Appearance: She is not ill-appearing.  Cardiovascular:     Rate and Rhythm: Normal rate.  Pulmonary:     Effort: Pulmonary effort is normal.     Vital Signs: BP 138/88 (BP Location: Right Arm)   Pulse 82   Temp 98.1 F (36.7 C)   Resp 18   Ht _0  (1.626 m)   Wt 56.2 kg   SpO2 97%   BMI 21.27 kg/m  Pain Scale: 0-10 POSS *See Group Information*: 1-Acceptable,Awake and alert Pain Score: 2    SpO2: SpO2: 97 % O2 Device:SpO2: 97 % O2 Flow Rate: .O2 Flow Rate (L/min): 4 L/min  IO: Intake/output summary:   Intake/Output Summary (Last 24 hours) at 10/15/2020 1253 Last data filed at 10/15/2020 1049 Gross per 24 hour  Intake 120 ml  Output 1225 ml   Net -1105 ml    LBM: Last BM Date: 10/14/20 Baseline Weight: Weight: 51.3 kg Most recent weight: Weight: 56.2 kg     Palliative Assessment/Data: PPS: 50%     Thank you for this consult. Palliative medicine will continue to follow and assist as needed.   Time In: 1153 Time Out: 1304 Time Total: 71 mins Greater than 50%  of this time was spent counseling and coordinating care related to the above assessment and plan.  Signed by: Mariana Kaufman, AGNP-C Palliative Medicine    Please contact Palliative Medicine Team phone at [phone] for questions and concerns.  For individual provider: See Shea Evans

## 2020-10-15 NOTE — Progress Notes (Signed)
  Subjective: 5 Days Post-Op Procedure(s) (LRB): INTRAMEDULLARY (IM) NAIL INTERTROCHANTRIC (Right) Patient reports pain as mild this morning.  More talkative today than when I saw her last Friday. Pressure dressings applied to the right leg, no bloody drainage noted today. Plan will be for SNF upon d/c. Negative for chest pain and shortness of breath  Objective: Vital signs in last 24 hours: Temp:  [98.4 F (36.9 C)-99.7 F (37.6 C)] 98.4 F (36.9 C) (11/15 0422) Pulse Rate:  [76-96] 85 (11/15 0422) Resp:  [14-17] 16 (11/15 0422) BP: (121-151)/(56-71) 151/65 (11/15 0422) SpO2:  [95 %-99 %] 96 % (11/15 0422) Weight:  [56.2 kg] 56.2 kg (11/15 0500)  Intake/Output from previous day:  Intake/Output Summary (Last 24 hours) at 10/15/2020 0739 Last data filed at 10/15/2020 0422 Gross per 24 hour  Intake 120 ml  Output 875 ml  Net -755 ml    Intake/Output this shift: No intake/output data recorded.  Labs: Recent Labs    10/13/20 0458 10/14/20 0159 10/15/20 0525  HGB 6.8* 9.8* 8.6*   Recent Labs    10/13/20 0458 10/14/20 0159  WBC 9.1 7.3  RBC 2.16* 3.17*  HCT 20.2* 28.6*  PLT 92* 97*   Recent Labs    10/13/20 0458 10/13/20 0458 10/14/20 0159 10/14/20 1443  NA 129*   < > 126* 129*  K 4.8  --  3.7  --   CL 94*  --  87*  --   CO2 29  --  31  --   BUN 19  --  13  --   CREATININE 0.65  --  0.59  --   GLUCOSE 122*  --  127*  --   CALCIUM 8.3*  --  7.9*  --    < > = values in this interval not displayed.   No results for input(s): LABPT, INR in the last 72 hours.   EXAM General - Patient is Alert, Appropriate and Oriented Extremity - Neurovascular intact Sensation intact distally Dorsiflexion/Plantar flexion intact No cellulitis present Compartment soft Dressing/Incision -mild serosanguinous drainage noted to proximal incision, no drainage noted to the middle and distal incision sites.  Pressure dressings remain intact. Motor Function - intact, moving foot  and toes well on exam, she is able to gentle flex and extend the ankle.  Assessment/Plan: 5 Days Post-Op Procedure(s) (LRB): INTRAMEDULLARY (IM) NAIL INTERTROCHANTRIC (Right) Principal Problem:   Fracture of femoral neck, right, closed ([facility]) Active Problems:   Hypokalemia   HTN (hypertension)   Fall   Chronic systolic CHF (congestive heart failure) (HCC)   COPD (chronic obstructive pulmonary disease) with emphysema ([facility])   Thrombocytopenia ([facility])  Estimated body mass index is 21.27 kg/m as calculated from the following:   Height as of this encounter: 5\' 4"  (1.626 m).   Weight as of this encounter: 56.2 kg.  Labs reviewed this AM. Hg 8.6, no significant drainage noted to the right hip incisions. Continue with PT today. Upon discharge, follow-up with [facility] in 10-14 days for staple removal.  DVT Prophylaxis - Lovenox, Ted hose and SCDs Weight-Bearing as tolerated to right leg  J. Cameron Proud, PA-C [facility] Orthopaedic Surgery 10/15/2020, 7:39 AM

## 2020-10-15 NOTE — Progress Notes (Signed)
Physical Therapy Treatment Patient Details Name: Melanie Huffman MRN: [id number] DOB: [birth year] Today's Date: 10/15/2020    History of Present Illness presented to ER secondary to mechanical fall with acute onset of R hip pain (down on floor x4 hours prior to recovery/assist); admitted for management of comminuted, displaced R proximal femur fracture, s/p IM nailing (10/10/20), PWB.    PT Comments    Participated in exercises as described below.  To EOB with max a x 1 and remained sitting x 8 minutes with min/mod a x 1.  Pt frequently leaning back despite cues to sit upright.  Fatigues quickly and requests to return to supine.  Positioned for comfort and RN in to resume care.   Follow Up Recommendations  SNF     Equipment Recommendations  Other (comment)    Recommendations for Other Services       Precautions / Restrictions Precautions Precautions: Fall Restrictions Weight Bearing Restrictions: Yes RLE Weight Bearing: Partial weight bearing    Mobility  Bed Mobility Overal bed mobility: Needs Assistance Bed Mobility: Supine to Sit;Sit to Supine     Supine to sit: Max assist Sit to supine: Max assist      Transfers Overall transfer level: Needs assistance               General transfer comment: deferred due to fatigue and poor tolerance  Ambulation/Gait             General Gait Details: unsafe/unable to tolerate at this time   Stairs             Wheelchair Mobility    Modified Rankin (Stroke Patients Only)       Balance Overall balance assessment: Needs assistance Sitting-balance support: No upper extremity supported;Feet supported Sitting balance-Leahy Scale: Poor                                      Cognition Arousal/Alertness: Awake/alert Behavior During Therapy: WFL for tasks assessed/performed Overall Cognitive Status: Within Functional Limits for tasks assessed                                         Exercises Other Exercises Other Exercises: Supine R LE therex, 1x10, act assist: ankle pumps, heel slides, hip adduct/abduct.  Slightly more movement throughout all ranges, less guarding this date    General Comments        Pertinent Vitals/Pain Pain Assessment: Faces Faces Pain Scale: Hurts even more Pain Location: R hip - generally comfortable with exercises Pain Descriptors / Indicators: Guarding;Sore Pain Intervention(s): Limited activity within patient's tolerance;Monitored during session;Repositioned    Home Living                      Prior Function            PT Goals (current goals can now be found in the care plan section) Progress towards PT goals: Progressing toward goals    Frequency    7X/week      PT Plan Current plan remains appropriate    Co-evaluation              AM-PAC PT "6 Clicks" Mobility   Outcome Measure  Help needed turning from your back to your side while in a flat bed without using bedrails?:  Total Help needed moving from lying on your back to sitting on the side of a flat bed without using bedrails?: Total Help needed moving to and from a bed to a chair (including a wheelchair)?: Total Help needed standing up from a chair using your arms (e.g., wheelchair or bedside chair)?: Total Help needed to walk in hospital room?: Total Help needed climbing 3-5 steps with a railing? : Total 6 Click Score: 6    End of Session   Activity Tolerance: Patient limited by fatigue Patient left: in bed;with call bell/phone within reach;with bed alarm set Nurse Communication: Mobility status Pain - Right/Left: Right Pain - part of body: Hip;Knee     Time: 2440-1027 PT Time Calculation (min) (ACUTE ONLY): 15 min  Charges:  $Therapeutic Exercise: 8-22 mins                    Chesley Noon, PTA 10/15/20, 10:47 AM

## 2020-10-15 NOTE — Progress Notes (Signed)
PROGRESS NOTE    Melanie Huffman  VWU:[id number] DOB: May 12, 1928 DOA: 10/10/2020 PCP: Melanie Body, MD    Brief Narrative:  Melanie Huffman is a 86 y.o. female with medical history significant for COPD, CHF, hypertension, anxiety disorder who was brought into the emergency room by EMS for evaluation of a fall  11/11-she underwent ORIF by Dr. Roland Rack on 11/10 Hemoglobin 6.9 this a.m.  11/12- no overnight issues. Hg 8.0 today. Daughter stated pt was confused a little this am. Ate some food. Also was given pain med this am  11/13-hemoglobin 6.8 today.  Patient is mentating well.  No overnight issues   11/14-patient has urinary retention, Foley was placed per response was initiated due to patient being in acute respiratory distress.  Patient sat dropped to low 80s and was noted to be hypotensive, tachycardia and tachypneic.  She also spiked fever 101.6   11/15- Hg stable. No overnight issues  Consultants:   orthopedics  Procedures:   Antimicrobials:       Subjective: Daughter at bedside, states mom looks better. Pt without complaints.    Objective: Vitals:   10/15/20 0422 10/15/20 0500 10/15/20 0801 10/15/20 1234  BP: (!) 151/65  (!) 152/82 138/88  Pulse: 85  86 82  Resp: 16  17 18   Temp: 98.4 F (36.9 C)  98.5 F (36.9 C) 98.1 F (36.7 C)  TempSrc: Oral  Oral   SpO2: 96%  97% 97%  Weight:  56.2 kg    Height:        Intake/Output Summary (Last 24 hours) at 10/15/2020 1250 Last data filed at 10/15/2020 1049 Gross per 24 hour  Intake 120 ml  Output 1225 ml  Net -1105 ml   Filed Weights   10/10/20 1233 10/10/20 1501 10/15/20 0500  Weight: 51.3 kg 51.3 kg 56.2 kg    Examination: Calm, mildly appearing sob with conversation. Minimal rales b/l, no rhonchi Regular s1/s2, no gallop Soft benign, +bs No edema b/l aaxoxo3 Mood and affect appropriate in current setting    Data Reviewed: I have personally reviewed following labs and imaging  studies  CBC: Recent Labs  Lab 10/10/20 1245 10/10/20 1245 10/11/20 0602 10/11/20 0602 10/11/20 2055 10/12/20 0444 10/13/20 0458 10/14/20 0159 10/15/20 0525  WBC 9.9  --  8.6  --   --  12.6* 9.1 7.3  --   NEUTROABS 8.4*  --   --   --   --   --   --   --   --   HGB 10.7*   < > 6.9*   < > 7.9* 8.0* 6.8* 9.8* 8.6*  HCT 31.8*  --  21.5*  --   --  23.5* 20.2* 28.6*  --   MCV 92.7  --  96.8  --   --  91.8 93.5 90.2  --   PLT 118*  --  93*  --   --  90* 92* 97*  --    < > = values in this interval not displayed.   Basic Metabolic Panel: Recent Labs  Lab 10/10/20 1245 10/10/20 1245 10/11/20 0602 10/12/20 0444 10/13/20 0458 10/14/20 0159 10/14/20 1443  NA 135   < > 131* 129* 129* 126* 129*  K 3.2*  --  5.0 4.8 4.8 3.7  --   CL 100  --  95* 93* 94* 87*  --   CO2 27  --  29 28 29 31   --   GLUCOSE 152*  --  134* 134*  122* 127*  --   BUN 7*  --  14 23 19 13   --   CREATININE 0.44  --  0.95 1.09* 0.65 0.59  --   CALCIUM 7.5*  --  8.3* 8.2* 8.3* 7.9*  --    < > = values in this interval not displayed.   GFR: Estimated Creatinine Clearance: 40.4 mL/min (by C-G formula based on SCr of 0.59 mg/dL). Liver Function Tests: No results for input(s): AST, ALT, ALKPHOS, BILITOT, PROT, ALBUMIN in the last 168 hours. No results for input(s): LIPASE, AMYLASE in the last 168 hours. No results for input(s): AMMONIA in the last 168 hours. Coagulation Profile: No results for input(s): INR, PROTIME in the last 168 hours. Cardiac Enzymes: Recent Labs  Lab 10/10/20 1245  CKTOTAL 75   BNP (last 3 results) No results for input(s): PROBNP in the last 8760 hours. HbA1C: No results for input(s): HGBA1C in the last 72 hours. CBG: No results for input(s): GLUCAP in the last 168 hours. Lipid Profile: No results for input(s): CHOL, HDL, LDLCALC, TRIG, CHOLHDL, LDLDIRECT in the last 72 hours. Thyroid Function Tests: No results for input(s): TSH, T4TOTAL, FREET4, T3FREE, THYROIDAB in the last 72  hours. Anemia Panel: No results for input(s): VITAMINB12, FOLATE, FERRITIN, TIBC, IRON, RETICCTPCT in the last 72 hours. Sepsis Labs: Recent Labs  Lab 10/14/20 0159 10/15/20 0525  PROCALCITON 0.18 0.34    Recent Results (from the past 240 hour(s))  Respiratory Panel by RT PCR (Flu A&B, Covid) - Nasopharyngeal Swab     Status: None   Collection Time: 10/10/20  1:38 PM   Specimen: Nasopharyngeal Swab  Result Value Ref Range Status   SARS Coronavirus 2 by RT PCR NEGATIVE NEGATIVE Final    Comment: (NOTE) SARS-CoV-2 target nucleic acids are NOT DETECTED.  The SARS-CoV-2 RNA is generally detectable in upper respiratoy specimens during the acute phase of infection. The lowest concentration of SARS-CoV-2 viral copies this assay can detect is 131 copies/mL. A negative result does not preclude SARS-Cov-2 infection and should not be used as the sole basis for treatment or other patient management decisions. A negative result may occur with  improper specimen collection/handling, submission of specimen other than nasopharyngeal swab, presence of viral mutation(s) within the areas targeted by this assay, and inadequate number of viral copies (<131 copies/mL). A negative result must be combined with clinical observations, patient history, and epidemiological information. The expected result is Negative.  Fact Sheet for Patients:  PinkCheek.be  Fact Sheet for Healthcare Providers:  GravelBags.it  This test is no t yet approved or cleared by the [place] FDA and  has been authorized for detection and/or diagnosis of SARS-CoV-2 by FDA under an Emergency Use Authorization (EUA). This EUA will remain  in effect (meaning this test can be used) for the duration of the COVID-19 declaration under Section 564(b)(1) of the Act, 21 U.S.C. section 360bbb-3(b)(1), unless the authorization is terminated or revoked sooner.     Influenza  A by PCR NEGATIVE NEGATIVE Final   Influenza B by PCR NEGATIVE NEGATIVE Final    Comment: (NOTE) The Xpert Xpress SARS-CoV-2/FLU/RSV assay is intended as an aid in  the diagnosis of influenza from Nasopharyngeal swab specimens and  should not be used as a sole basis for treatment. Nasal washings and  aspirates are unacceptable for Xpert Xpress SARS-CoV-2/FLU/RSV  testing.  Fact Sheet for Patients: PinkCheek.be  Fact Sheet for Healthcare Providers: GravelBags.it  This test is not yet approved or cleared by  the [employer] and  has been authorized for detection and/or diagnosis of SARS-CoV-2 by  FDA under an Emergency Use Authorization (EUA). This EUA will remain  in effect (meaning this test can be used) for the duration of the  Covid-19 declaration under Section 564(b)(1) of the Act, 21  U.S.C. section 360bbb-3(b)(1), unless the authorization is  terminated or revoked. Performed at [facility], [address] [place], [place] [address]   Culture, Urine     Status: None   Collection Time: 10/14/20  1:38 AM   Specimen: Urine, Clean Catch  Result Value Ref Range Status   Specimen Description   Final    URINE, CLEAN CATCH Performed at [facility], [address], [place], [place] [address]    Special Requests   Final    NONE Performed at [facility], [address] [place], [place] [address]    Culture   Final    NO GROWTH Performed at [facility], [place] [address], [place], [place] [address]    Report Status 10/15/2020 FINAL  Final  CULTURE, BLOOD (ROUTINE X 2) w Reflex to ID Panel     Status: None (Preliminary result)   Collection Time: 10/14/20  2:05 AM   Specimen: BLOOD  Result Value Ref Range Status   Specimen Description BLOOD BOTTLES DRAWN AEROBIC AND ANAEROBIC  Final   Special Requests ARM Blood Culture adequate volume  Final   Culture   Final    NO GROWTH 1  DAY Performed at [facility], [address] [place], [place] [address]    Report Status PENDING  Incomplete  CULTURE, BLOOD (ROUTINE X 2) w Reflex to ID Panel     Status: None (Preliminary result)   Collection Time: 10/14/20  2:05 AM   Specimen: BLOOD  Result Value Ref Range Status   Specimen Description BLOOD BOTTLES DRAWN AEROBIC AND ANAEROBIC  Final   Special Requests LEFT ANTECUBITAL Blood Culture adequate volume  Final   Culture   Final    NO GROWTH 1 DAY Performed at [facility], [address] [place], [place] [address]    Report Status PENDING  Incomplete         Radiology Studies: DG Chest 1 View  Result Date: 10/14/2020 CLINICAL DATA:  Congestive heart failure EXAM: CHEST  1 VIEW COMPARISON:  10/10/2020 FINDINGS: Slightly increased interstitial opacity bilaterally. No pleural effusion. Mild cardiomegaly. IMPRESSION: Cardiomegaly with slightly increased interstitial opacity, but no overt pulmonary edema. Electronically Signed   By: Ulyses Jarred M.D.   On: 10/14/2020 02:07        Scheduled Meds: . sodium chloride   Intravenous Once  . vitamin C  500 mg Oral Daily  . calcium carbonate  1 tablet Oral Q breakfast   And  . cholecalciferol  400 Units Oral Daily  . carvedilol  3.125 mg Oral BID WC  . docusate sodium  100 mg Oral BID  . enoxaparin (LOVENOX) injection  40 mg Subcutaneous Q24H  . escitalopram  10 mg Oral Daily  . feeding supplement  237 mL Oral BID BM  . umeclidinium bromide  1 puff Inhalation Daily   And  . fluticasone furoate-vilanterol  1 puff Inhalation Daily  . multivitamin with minerals   Oral Daily  . pantoprazole  40 mg Oral Daily  . raloxifene  60 mg Oral Daily   Continuous Infusions:   Assessment & Plan:   Principal Problem:   Fracture  of femoral neck, right, closed ([place]) Active Problems:   Hypokalemia   HTN (hypertension)   Fall   Chronic systolic CHF (congestive heart failure) (HCC)   COPD (chronic  obstructive pulmonary disease) with emphysema (HCC)   Thrombocytopenia (HCC)  Fracture of femoral neck-s/p fall S/p ORIF on 11/10 by Dr. Roland Rack Pain control, bowel regimen SNF pending- daughter finally agreed to peaks.    Fever -Tmax 101.6.  No leukocytosis Bcx/ucx pending from 11/14 UA negative cxr no pna Remains afebrile last 24 hrs Possibly due to postop versus blood transfusion she had received. Continue to monitor. Blood cultures pending    Acute respiratory distresse- likely 2/2 fluid overload.  Improved with lasix iv given Clinically better, but still mildly sob with conversation. Will give another lasix 20mg  iv x1 Monitor I/o Daily weight   Acute /Chronic systolic heart failure Last known LVEF of 45% 11/14- overnight likely volume overloaded, given lasix, with improvement this am. cxr obtained , no overt pulmonary edema. Likely 2/2 transfusion 11/15- clinically improving, still mildly volume overloaded, will give another lasix 20mg  iv x1 Strict I/o Daily weight    Hypertension BP low post surgery, will discontinue blood pressure medications Gentle hydration, avoid volume overloa 11/14- starting to increase, will add coreg 3.125 bid with parameters 11/15- slowly increasing, will increase coreg to 6.25 mg bid Also given lasix x1 today as above    Post-op Anemia with acute blood loss-  Hg was 6.9 , S/p 1unit prbc on 11/11. 11/13- Hg 6.8 this am, will transfuse 1 unit prbc today Lasix 20mg  iv post transfusion 11/14-hemoglobin 9.8 11/15-stool occult negative. Hg. 8.6 Continue to monitor Transfuse if Hg <7     COPD with chronic respiratory failure Without acute exacerbation, chronically 3 L continuous O2 Continue inhalers    Depression  Continue lexapro      Hypokalemia Secondary to diuretic use Was supplemented  K is 5.0, will discontinue all potassium supplementation to prevent further increase 11/15Ck am labs since received lasix  today   Hyponatremia- Na 129- etiology unclear.  Has decrease po intake, was on ivf. Unsure possibly due to hypervolemia. Na 126 very early morning, was given lasix when went int acute respiratory distress. Will reck Na level.    Chronic thrombocytopenia Platelets close to baseline No signs of overt bleed. Continue to monitor    DVT prophylaxis: Lovenox Code Status: DNR Family Communication: Called daughter and updated her about patient's status and findings.  Also discuss about consulting palliative care to set goals of care she was agreeable to this  Status is: Inpatient  Remains inpatient appropriate because:Inpatient level of care appropriate due to severity of illness   Dispo: The patient is from: Home              Anticipated d/c is to: SNF              Anticipated d/c date is: 1 -2 day              Patient currently is not medically stable to d/c.  Needs iv lasix, ck am labs, snf bed authorization pending.           LOS: 5 days   Time spent: 35 minutes with more than 50% on [place], MD [facility] Pager [phone]  If 7PM-7AM, please contact night-coverage [email or web address] Password [email or web address] 10/15/2020, 12:50 PM

## 2020-10-15 NOTE — Care Management Important Message (Signed)
Important Message  Patient Details  Name: Melanie Huffman MRN: [id number] Date of Birth: 1928/08/16   Medicare Important Message Given:  Yes     [name] 10/15/2020, 12:12 PM

## 2020-10-16 DIAGNOSIS — Z8781 Personal history of (healed) traumatic fracture: Secondary | ICD-10-CM | POA: Diagnosis not present

## 2020-10-16 DIAGNOSIS — Z111 Encounter for screening for respiratory tuberculosis: Secondary | ICD-10-CM | POA: Diagnosis not present

## 2020-10-16 DIAGNOSIS — Z23 Encounter for immunization: Secondary | ICD-10-CM | POA: Diagnosis not present

## 2020-10-16 DIAGNOSIS — R488 Other symbolic dysfunctions: Secondary | ICD-10-CM | POA: Diagnosis not present

## 2020-10-16 DIAGNOSIS — Z022 Encounter for examination for admission to residential institution: Secondary | ICD-10-CM | POA: Diagnosis not present

## 2020-10-16 DIAGNOSIS — L89613 Pressure ulcer of right heel, stage 3: Secondary | ICD-10-CM | POA: Diagnosis not present

## 2020-10-16 DIAGNOSIS — I5189 Other ill-defined heart diseases: Secondary | ICD-10-CM | POA: Diagnosis not present

## 2020-10-16 DIAGNOSIS — M79675 Pain in left toe(s): Secondary | ICD-10-CM | POA: Diagnosis not present

## 2020-10-16 DIAGNOSIS — K2100 Gastro-esophageal reflux disease with esophagitis, without bleeding: Secondary | ICD-10-CM | POA: Diagnosis not present

## 2020-10-16 DIAGNOSIS — E222 Syndrome of inappropriate secretion of antidiuretic hormone: Secondary | ICD-10-CM | POA: Diagnosis not present

## 2020-10-16 DIAGNOSIS — K5900 Constipation, unspecified: Secondary | ICD-10-CM | POA: Diagnosis not present

## 2020-10-16 DIAGNOSIS — L820 Inflamed seborrheic keratosis: Secondary | ICD-10-CM | POA: Diagnosis not present

## 2020-10-16 DIAGNOSIS — F419 Anxiety disorder, unspecified: Secondary | ICD-10-CM | POA: Diagnosis not present

## 2020-10-16 DIAGNOSIS — R4182 Altered mental status, unspecified: Secondary | ICD-10-CM | POA: Diagnosis not present

## 2020-10-16 DIAGNOSIS — L89616 Pressure-induced deep tissue damage of right heel: Secondary | ICD-10-CM | POA: Diagnosis not present

## 2020-10-16 DIAGNOSIS — E876 Hypokalemia: Secondary | ICD-10-CM | POA: Diagnosis not present

## 2020-10-16 DIAGNOSIS — J449 Chronic obstructive pulmonary disease, unspecified: Secondary | ICD-10-CM | POA: Diagnosis not present

## 2020-10-16 DIAGNOSIS — S72001D Fracture of unspecified part of neck of right femur, subsequent encounter for closed fracture with routine healing: Secondary | ICD-10-CM | POA: Diagnosis not present

## 2020-10-16 DIAGNOSIS — I5022 Chronic systolic (congestive) heart failure: Secondary | ICD-10-CM | POA: Diagnosis not present

## 2020-10-16 DIAGNOSIS — Z9889 Other specified postprocedural states: Secondary | ICD-10-CM | POA: Diagnosis not present

## 2020-10-16 DIAGNOSIS — J9601 Acute respiratory failure with hypoxia: Secondary | ICD-10-CM | POA: Diagnosis not present

## 2020-10-16 DIAGNOSIS — Z0000 Encounter for general adult medical examination without abnormal findings: Secondary | ICD-10-CM | POA: Diagnosis not present

## 2020-10-16 DIAGNOSIS — M25579 Pain in unspecified ankle and joints of unspecified foot: Secondary | ICD-10-CM | POA: Diagnosis not present

## 2020-10-16 DIAGNOSIS — I700 Atherosclerosis of aorta: Secondary | ICD-10-CM | POA: Diagnosis not present

## 2020-10-16 DIAGNOSIS — J439 Emphysema, unspecified: Secondary | ICD-10-CM | POA: Diagnosis not present

## 2020-10-16 DIAGNOSIS — R1312 Dysphagia, oropharyngeal phase: Secondary | ICD-10-CM | POA: Diagnosis not present

## 2020-10-16 DIAGNOSIS — E559 Vitamin D deficiency, unspecified: Secondary | ICD-10-CM | POA: Diagnosis not present

## 2020-10-16 DIAGNOSIS — B351 Tinea unguium: Secondary | ICD-10-CM | POA: Diagnosis not present

## 2020-10-16 DIAGNOSIS — D649 Anemia, unspecified: Secondary | ICD-10-CM | POA: Diagnosis not present

## 2020-10-16 DIAGNOSIS — R5381 Other malaise: Secondary | ICD-10-CM | POA: Diagnosis not present

## 2020-10-16 DIAGNOSIS — R110 Nausea: Secondary | ICD-10-CM | POA: Diagnosis not present

## 2020-10-16 DIAGNOSIS — K567 Ileus, unspecified: Secondary | ICD-10-CM | POA: Diagnosis not present

## 2020-10-16 DIAGNOSIS — E871 Hypo-osmolality and hyponatremia: Secondary | ICD-10-CM | POA: Diagnosis not present

## 2020-10-16 DIAGNOSIS — L298 Other pruritus: Secondary | ICD-10-CM | POA: Diagnosis not present

## 2020-10-16 DIAGNOSIS — L89896 Pressure-induced deep tissue damage of other site: Secondary | ICD-10-CM | POA: Diagnosis not present

## 2020-10-16 DIAGNOSIS — Z853 Personal history of malignant neoplasm of breast: Secondary | ICD-10-CM | POA: Diagnosis not present

## 2020-10-16 DIAGNOSIS — R498 Other voice and resonance disorders: Secondary | ICD-10-CM | POA: Diagnosis not present

## 2020-10-16 DIAGNOSIS — R279 Unspecified lack of coordination: Secondary | ICD-10-CM | POA: Diagnosis not present

## 2020-10-16 DIAGNOSIS — L03115 Cellulitis of right lower limb: Secondary | ICD-10-CM | POA: Diagnosis not present

## 2020-10-16 DIAGNOSIS — R296 Repeated falls: Secondary | ICD-10-CM | POA: Diagnosis not present

## 2020-10-16 DIAGNOSIS — Z136 Encounter for screening for cardiovascular disorders: Secondary | ICD-10-CM | POA: Diagnosis not present

## 2020-10-16 DIAGNOSIS — F338 Other recurrent depressive disorders: Secondary | ICD-10-CM | POA: Diagnosis not present

## 2020-10-16 DIAGNOSIS — D696 Thrombocytopenia, unspecified: Secondary | ICD-10-CM | POA: Diagnosis not present

## 2020-10-16 DIAGNOSIS — S72001K Fracture of unspecified part of neck of right femur, subsequent encounter for closed fracture with nonunion: Secondary | ICD-10-CM | POA: Diagnosis not present

## 2020-10-16 DIAGNOSIS — D0439 Carcinoma in situ of skin of other parts of face: Secondary | ICD-10-CM | POA: Diagnosis not present

## 2020-10-16 DIAGNOSIS — M6281 Muscle weakness (generalized): Secondary | ICD-10-CM | POA: Diagnosis not present

## 2020-10-16 DIAGNOSIS — S7221XD Displaced subtrochanteric fracture of right femur, subsequent encounter for closed fracture with routine healing: Secondary | ICD-10-CM | POA: Diagnosis not present

## 2020-10-16 DIAGNOSIS — T887XXA Unspecified adverse effect of drug or medicament, initial encounter: Secondary | ICD-10-CM | POA: Diagnosis not present

## 2020-10-16 DIAGNOSIS — D485 Neoplasm of uncertain behavior of skin: Secondary | ICD-10-CM | POA: Diagnosis not present

## 2020-10-16 DIAGNOSIS — I10 Essential (primary) hypertension: Secondary | ICD-10-CM | POA: Diagnosis not present

## 2020-10-16 DIAGNOSIS — R42 Dizziness and giddiness: Secondary | ICD-10-CM | POA: Diagnosis not present

## 2020-10-16 DIAGNOSIS — Z66 Do not resuscitate: Secondary | ICD-10-CM | POA: Diagnosis not present

## 2020-10-16 DIAGNOSIS — J9621 Acute and chronic respiratory failure with hypoxia: Secondary | ICD-10-CM | POA: Diagnosis not present

## 2020-10-16 DIAGNOSIS — I509 Heart failure, unspecified: Secondary | ICD-10-CM | POA: Diagnosis not present

## 2020-10-16 DIAGNOSIS — Z7189 Other specified counseling: Secondary | ICD-10-CM | POA: Diagnosis not present

## 2020-10-16 DIAGNOSIS — M79674 Pain in right toe(s): Secondary | ICD-10-CM | POA: Diagnosis not present

## 2020-10-16 DIAGNOSIS — R339 Retention of urine, unspecified: Secondary | ICD-10-CM | POA: Diagnosis not present

## 2020-10-16 DIAGNOSIS — L600 Ingrowing nail: Secondary | ICD-10-CM | POA: Diagnosis not present

## 2020-10-16 DIAGNOSIS — L97412 Non-pressure chronic ulcer of right heel and midfoot with fat layer exposed: Secondary | ICD-10-CM | POA: Diagnosis not present

## 2020-10-16 DIAGNOSIS — L97411 Non-pressure chronic ulcer of right heel and midfoot limited to breakdown of skin: Secondary | ICD-10-CM | POA: Diagnosis not present

## 2020-10-16 DIAGNOSIS — R1310 Dysphagia, unspecified: Secondary | ICD-10-CM | POA: Diagnosis not present

## 2020-10-16 DIAGNOSIS — F411 Generalized anxiety disorder: Secondary | ICD-10-CM | POA: Diagnosis not present

## 2020-10-16 DIAGNOSIS — I739 Peripheral vascular disease, unspecified: Secondary | ICD-10-CM | POA: Diagnosis not present

## 2020-10-16 DIAGNOSIS — L538 Other specified erythematous conditions: Secondary | ICD-10-CM | POA: Diagnosis not present

## 2020-10-16 LAB — BASIC METABOLIC PANEL
Anion gap: 5 (ref 5–15)
BUN: 14 mg/dL (ref 8–23)
CO2: 35 mmol/L — ABNORMAL HIGH (ref 22–32)
Calcium: 7.8 mg/dL — ABNORMAL LOW (ref 8.9–10.3)
Chloride: 90 mmol/L — ABNORMAL LOW (ref 98–111)
Creatinine, Ser: 0.46 mg/dL (ref 0.44–1.00)
GFR, Estimated: >60 mL/min (ref >60)
Glucose, Bld: 109 mg/dL — ABNORMAL HIGH (ref 70–99)
Potassium: 3.8 mmol/L (ref 3.5–5.1)
Sodium: 130 mmol/L — ABNORMAL LOW (ref 135–145)

## 2020-10-16 LAB — CBC
HCT: 27 % — ABNORMAL LOW (ref 36.0–46.0)
Hemoglobin: 8.9 g/dL — ABNORMAL LOW (ref 12.0–15.0)
MCH: 30.7 pg (ref 26.0–34.0)
MCHC: 33 g/dL (ref 30.0–36.0)
MCV: 93.1 fL (ref 80.0–100.0)
Platelets: 143 10*3/uL — ABNORMAL LOW (ref 150–400)
RBC: 2.9 MIL/uL — ABNORMAL LOW (ref 3.87–5.11)
RDW: 13.5 % (ref 11.5–15.5)
WBC: 5.2 10*3/uL (ref 4.0–10.5)
nRBC: 0 % (ref 0.0–0.2)

## 2020-10-16 LAB — RESPIRATORY PANEL BY RT PCR (FLU A&B, COVID)
Influenza A by PCR: NEGATIVE
Influenza B by PCR: NEGATIVE
SARS Coronavirus 2 by RT PCR: NEGATIVE

## 2020-10-16 LAB — PROCALCITONIN: Procalcitonin: 0.27 ng/mL

## 2020-10-16 MED ORDER — FUROSEMIDE 10 MG/ML IJ SOLN
20.0000 mg | Freq: Once | INTRAMUSCULAR | Status: AC
  Administered 2020-10-16: 20 mg via INTRAVENOUS
  Filled 2020-10-16: qty 4

## 2020-10-16 MED ORDER — DOCUSATE SODIUM 100 MG PO CAPS: 100.0000 mg | ORAL_CAPSULE | Freq: Two times a day (BID) | ORAL | 0 refills | Status: AC

## 2020-10-16 MED ORDER — CARVEDILOL 6.25 MG PO TABS: 6.2500 mg | ORAL_TABLET | Freq: Two times a day (BID) | ORAL | Status: AC

## 2020-10-16 MED ORDER — POTASSIUM CHLORIDE CRYS ER 10 MEQ PO TBCR
10.0000 meq | EXTENDED_RELEASE_TABLET | Freq: Once | ORAL | Status: AC
  Administered 2020-10-16: 10 meq via ORAL
  Filled 2020-10-16: qty 1

## 2020-10-16 MED ORDER — BISACODYL 10 MG RE SUPP: 10.0000 mg | Freq: Every day | RECTAL | 0 refills | Status: AC | PRN

## 2020-10-16 MED ORDER — ENSURE ENLIVE PO LIQD: 237.0000 mL | Freq: Two times a day (BID) | ORAL | 12 refills | Status: AC

## 2020-10-16 NOTE — Progress Notes (Signed)
Physical Therapy Treatment Patient Details Name: Melanie Huffman MRN: [id number] DOB: [birth year] Today's Date: 10/16/2020    History of Present Illness presented to ER secondary to mechanical fall with acute onset of R hip pain (down on floor x4 hours prior to recovery/assist); admitted for management of comminuted, displaced R proximal femur fracture, s/p IM nailing (10/10/20), PWB.    PT Comments    Pt was long sitting in bed upon arriving. She agrees to PT session and is cooperative and pleasant throughout. Resting vitals: 126/67, HR 81, sao2 97% on 4 L O2 and was weaned to 3 L o2 and able to stay > 90%. She required max assist to exit bed. Max assist of one to stand EOB 2 x to RW. Pt was able to maintain PWB throughout session. She then was able to stand pivot to recliner from EOB. Overall pt tolerated well and is progressing towards PT goals. Highly recommend DC to SNF to address deficits and improve safe functional mobility. At conclusion of session, pt was in recliner with chair alarm in place, call bell in reach, and RN aware of pt's abilities.     Follow Up Recommendations  SNF     Equipment Recommendations  Other (comment) (defer to next level of care)    Recommendations for Other Services       Precautions / Restrictions Precautions Precautions: Fall Restrictions Weight Bearing Restrictions: Yes RLE Weight Bearing: Partial weight bearing    Mobility  Bed Mobility Overal bed mobility: Needs Assistance Bed Mobility: Supine to Sit     Supine to sit: Max assist     General bed mobility comments: Max assist + vcs to achieve EOB sitting  Transfers Overall transfer level: Needs assistance Equipment used: Rolling walker (2 wheeled) Transfers: Sit to/from [facility] Sit to Stand: Max assist;From elevated surface (of one) Stand pivot transfers: Max assist       General transfer comment: Max assist to STS from EOB 2 x prior to stand pivot to recliner.  pt was able to adhere to proper PWB throughout session.  Ambulation/Gait Ambulation/Gait assistance: Max assist Gait Distance (Feet): 2 Feet Assistive device: Rolling walker (2 wheeled) Gait Pattern/deviations: Antalgic (more of a pivot than ambulation) Gait velocity: decreased   General Gait Details: Pt was able to pivot/step with max vcs and assistance. did well with PWB however poor posture and fatigues quickly       Balance Overall balance assessment: Needs assistance Sitting-balance support: No upper extremity supported;Feet supported Sitting balance-Leahy Scale: Fair Sitting balance - Comments: supervision for sitting balance   Standing balance support: Bilateral upper extremity supported;During functional activity Standing balance-Leahy Scale: Fair Standing balance comment: reliant on RW for support         Cognition Arousal/Alertness: Awake/alert Behavior During Therapy: WFL for tasks assessed/performed Overall Cognitive Status: Within Functional Limits for tasks assessed        General Comments: Pt was A and O x 4              Pertinent Vitals/Pain Pain Assessment: No/denies pain Pain Score: 0-No pain Faces Pain Scale: Hurts a little bit Pain Location: R hip - generally comfortable with exercises Pain Descriptors / Indicators: Guarding;Sore Pain Intervention(s): Limited activity within patient's tolerance;Monitored during session;Premedicated before session;Repositioned        PT Goals (current goals can now be found in the care plan section) Acute Rehab PT Goals Patient Stated Goal: none stated Progress towards PT goals: Progressing toward goals  Frequency    7X/week      PT Plan Current plan remains appropriate       AM-PAC PT "6 Clicks" Mobility   Outcome Measure  Help needed turning from your back to your side while in a flat bed without using bedrails?: A Lot Help needed moving from lying on your back to sitting on the side of a  flat bed without using bedrails?: A Lot Help needed moving to and from a bed to a chair (including a wheelchair)?: A Lot Help needed standing up from a chair using your arms (e.g., wheelchair or bedside chair)?: A Lot Help needed to walk in hospital room?: A Lot Help needed climbing 3-5 steps with a railing? : Total 6 Click Score: 11    End of Session Equipment Utilized During Treatment: Gait belt;Oxygen Activity Tolerance: Patient limited by fatigue;Patient tolerated treatment well Patient left: in chair;with call bell/phone within reach;with chair alarm set;with family/visitor present Nurse Communication: Mobility status PT Visit Diagnosis: Muscle weakness (generalized) (M62.81);Difficulty in walking, not elsewhere classified (R26.2);Pain Pain - Right/Left: Right Pain - part of body: Hip;Knee     Time: 0900-0940 PT Time Calculation (min) (ACUTE ONLY): 40 min  Charges:  $Therapeutic Exercise: 8-22 mins $Therapeutic Activity: 23-37 mins                     Julaine Fusi PTA 10/16/20, 11:32 AM

## 2020-10-16 NOTE — TOC Progression Note (Signed)
Transition of Care [facility]) - Progression Note    Patient Details  Name: Melanie Huffman MRN: [id number] Date of Birth: 12-09-1927  Transition of Care [facility]) CM/SW [place], RN Phone Number: 10/16/2020, 1:42 PM  Clinical Narrative:   RNCM received insurance authorization through [employer] for patient which will be good for 7 days once patient enters facility, she has 5 days to transfer to facility. Her insurance authorization number is [id number].     Expected Discharge Plan: [place] Barriers to Discharge: Continued Medical Work up, SNF Pending bed offer  Expected Discharge Plan and Services Expected Discharge Plan: [place] In-house Referral: Clinical Social Work   Post Acute Care Choice: [place] Living arrangements for the past 2 months: [place] ([place])                                       Social Determinants of Health ([place]) Interventions    Readmission Risk Interventions No flowsheet data found.

## 2020-10-16 NOTE — Progress Notes (Signed)
  Subjective: 6 Days Post-Op Procedure(s) (LRB): INTRAMEDULLARY (IM) NAIL INTERTROCHANTRIC (Right) Patient reports pain as mild this morning. Very responsive this AM. Patient has had a bowel movement. Plan will be for SNF upon d/c. Negative for chest pain and shortness of breath  Objective: Vital signs in last 24 hours: Temp:  [98.2 F (36.8 C)-98.7 F (37.1 C)] 98.2 F (36.8 C) (11/16 1138) Pulse Rate:  [74-79] 77 (11/16 1138) Resp:  [16-18] 18 (11/16 1138) BP: (110-162)/(56-85) 110/56 (11/16 1138) SpO2:  [98 %-100 %] 98 % (11/16 1138) Weight:  [56.8 kg] 56.8 kg (11/16 0500)  Intake/Output from previous day:  Intake/Output Summary (Last 24 hours) at 10/16/2020 1337 Last data filed at 10/16/2020 1000 Gross per 24 hour  Intake 1830 ml  Output --  Net 1830 ml    Intake/Output this shift: Total I/O In: 480 [P.O.:480] Out: -   Labs: Recent Labs    10/14/20 0159 10/15/20 0525 10/16/20 0352  HGB 9.8* 8.6* 8.9*   Recent Labs    10/14/20 0159 10/16/20 0352  WBC 7.3 5.2  RBC 3.17* 2.90*  HCT 28.6* 27.0*  PLT 97* 143*   Recent Labs    10/14/20 0159 10/14/20 0159 10/14/20 1443 10/16/20 0352  NA 126*   < > 129* 130*  K 3.7  --   --  3.8  CL 87*  --   --  90*  CO2 31  --   --  35*  BUN 13  --   --  14  CREATININE 0.59  --   --  0.46  GLUCOSE 127*  --   --  109*  CALCIUM 7.9*  --   --  7.8*   < > = values in this interval not displayed.   No results for input(s): LABPT, INR in the last 72 hours.   EXAM General - Patient is Alert, Appropriate and Oriented Extremity - Neurovascular intact Sensation intact distally Dorsiflexion/Plantar flexion intact No cellulitis present Compartment soft Dressing/Incision -Moderate serosanguinous drainage noted to proximal incision, no drainage noted to the middle and distal incision sites.  Pressure dressings remain intact. Motor Function - intact, moving foot and toes well on exam, she is able to gentle flex and extend  the ankle.  Assessment/Plan: 6 Days Post-Op Procedure(s) (LRB): INTRAMEDULLARY (IM) NAIL INTERTROCHANTRIC (Right) Principal Problem:   Fracture of femoral neck, right, closed ([place]) Active Problems:   Hypokalemia   HTN (hypertension)   Fall   Chronic systolic CHF (congestive heart failure) (HCC)   COPD (chronic obstructive pulmonary disease) with emphysema (HCC)   Thrombocytopenia (HCC)   Advanced care planning/counseling discussion   Goals of care, counseling/discussion   Palliative care by specialist  Estimated body mass index is 21.49 kg/m as calculated from the following:   Height as of this encounter: 5\' 4"  (1.626 m).   Weight as of this encounter: 56.8 kg.  Labs reviewed this AM. Hg 8.9, up from 8.6 yesterday, no significant drainage noted to the right hip incisions. Patient has had a BM. Continue with PT today.  Patient will need SNF upon discharge. Upon discharge, follow-up with [place] in 10-14 days for staple removal.  DVT Prophylaxis - Lovenox, Ted hose and SCDs Weight-Bearing as tolerated to right leg  J. Cameron Proud, PA-C [facility] 10/16/2020, 1:37 PM

## 2020-10-16 NOTE — Progress Notes (Signed)
   10/16/20 0150  Clinical Encounter Type  Visited With Patient and family together  Visit Type Initial;Spiritual support;Social support  Referral From Chaplain  Consult/Referral To Chaplain  While rounding unit I notice Pt and daughter. Ch went into the room to say hello. Pt seem to be happy to see Ch. Pt informed me that she is going home soon. Ch will follow-up later.

## 2020-10-16 NOTE — Discharge Summary (Signed)
Melanie Huffman PFX:[id number] DOB: 1928-05-10 DOA: 10/10/2020  PCP: Dion Body, MD  Admit date: 10/10/2020 Discharge date: 10/16/2020  Admitted From: home Disposition:  SNF  Recommendations for Outpatient Follow-up:  1. Follow up with PCP in 1 week 2. Please obtain BMP/CBC in one week 3.follow-up with [place] in 10-14 days for staple removal 4.Patient is WBAT to RLE. Check potassium level in am      Discharge Condition:Stable CODE STATUS:DNR  Diet recommendation: Dysphagia 3 diet    Brief/Interim Summary: Per H/P: Melanie Huffman is a 86 y.o. female with medical history significant for COPD, CHF, hypertension, anxiety disorder who was brought into the emergency room by EMS for evaluation of a fall. X-ray of the right hip shows an acute comminuted and displaced fracture of the proximal right femur.  Orthopedic surgery has been consulted.  Fracture of femoral neck-s/p fall Status post ORIF on 11/10 by Dr. Roland Rack  Plan for SNF TED hose, SCDs and Lovenox for DVT prophylaxis Continue bowel regimen   Postop urinary retention -Foley was placed  Will need voiding trial in 2 days.    Fever -Tmax 101.6.  No leukocytosis Possibly due to postop versus blood transfusion she had received.   Has remained afebrile for the last 48 hours  UA negative  Chest x-ray no pneumonia  Blood cultures to date negative      Acute respiratory distresse- likely 2/2 fluid overload.  Received lasix 20mg  iv x1 on 11/15 Still mildly volume overload, although now down from 4L to 3L baseline o2  Received lasix 20 mg today, and K supplemented .   Acute /Chronic systolic heart failure Last known LVEF of 45% Was fluid overloaded, was given lasix with improvement of her symptoms Use lasix 20mg  if daily weight up by 3lbs. Continue coreg, increase as tolerated.     Hypertension BP low post surgery, will discontinue blood pressure medications 11/14- starting to increase,  will add coreg 3.125 bid with parameters 11/15- slowly increasing, will increase coreg to 6.25 mg bid BP stable.  Continue coreg, increase as tolerated. Her ACEI held as bp will not tolerately currently   Post-op Anemia with acute blood loss-  Hg was 6.9 , S/p 1unit prbc on 11/11. 11/13- Hg 6.8 this am, will transfuse 1 unit prbc today Lasix 20mg  iv post transfusion -stool occult negative.  Hg remains stable today, 8.9 Monitor cbc within one week     COPDwith chronic respiratory failure Without acute exacerbation, chronically on 3 L continuous oxygen, currently at baseline  Continue inhalers     Depression  Continue lexapro      Hypokalemia Was supplemented. Ck K in am as she received lasix and K today   Hyponatremia- Na 129- etiology unclear.  Initially was due to decreased p.o. intake and dehydration, was given IV fluids with improvement. Then was volume overloaded, Na dropped to 126. Now Na 130 improved with diuresis   Chronic thrombocytopenia Platelets close to baseline No signs of overt bleed.     Discharge Diagnoses:  Principal Problem:   Fracture of femoral neck, right, closed ([place]) Active Problems:   Hypokalemia   HTN (hypertension)   Fall   Chronic systolic CHF (congestive heart failure) (HCC)   COPD (chronic obstructive pulmonary disease) with emphysema (HCC)   Thrombocytopenia (HCC)   Advanced care planning/counseling discussion   Goals of care, counseling/discussion   Palliative care by specialist    Discharge Instructions  Discharge Instructions    Call MD for:  severe uncontrolled pain   Complete by: As directed    Call MD for:  temperature >100.4   Complete by: As directed    Diet - low sodium heart healthy   Complete by: As directed    Increase activity slowly   Complete by: As directed    No wound care   Complete by: As directed      Allergies as of 10/16/2020      Reactions   Amlodipine Swelling     [Onset: 08/26/2013]feet and neck begin to swell   Adhesive [tape] Other (See Comments)   Skin is very thin and tears easily. Please use paper tape only   Codeine Nausea Only   Percocet [oxycodone-acetaminophen] Nausea And Vomiting   Patient took on an empty stomach. Good relief but unsettled. Still has not eaten today   Sulfa Antibiotics Nausea Only, Rash   rash      Medication List    STOP taking these medications   acetaminophen 325 MG tablet Commonly known as: TYLENOL   CENTRUM SILVER PO   cloNIDine 0.1 MG tablet Commonly known as: CATAPRES   furosemide 40 MG tablet Commonly known as: LASIX   hydrALAZINE 25 MG tablet Commonly known as: APRESOLINE   potassium chloride SA 20 MEQ tablet Commonly known as: KLOR-CON   ramipril 10 MG capsule Commonly known as: ALTACE     TAKE these medications   albuterol 108 (90 Base) MCG/ACT inhaler Commonly known as: VENTOLIN HFA Inhale 2 puffs into the lungs every 6 (six) hours as needed for wheezing or shortness of breath.   ALPRAZolam 0.25 MG tablet Commonly known as: XANAX Take 0.25 mg by mouth at bedtime as needed.   bisacodyl 10 MG suppository Commonly known as: DULCOLAX Place 1 suppository (10 mg total) rectally daily as needed for moderate constipation.   Calcium 600/Vitamin D 600-400 MG-UNIT Tabs Generic drug: Calcium Carbonate-Vitamin D3 Take 1 tablet by mouth daily.   carvedilol 6.25 MG tablet Commonly known as: COREG Take 1 tablet (6.25 mg total) by mouth 2 (two) times daily with a meal. What changed:   medication strength  how much to take   docusate sodium 100 MG capsule Commonly known as: COLACE Take 1 capsule (100 mg total) by mouth 2 (two) times daily.   enoxaparin 40 MG/0.4ML injection Commonly known as: LOVENOX Inject 0.4 mLs (40 mg total) into the skin daily.   escitalopram 10 MG tablet Commonly known as: LEXAPRO Take 10 mg by mouth daily.   feeding supplement Liqd Take 237 mLs by mouth 2  (two) times daily between meals. Start taking on: October 17, 2020   METAMUCIL FIBER PO Take 5 capsules by mouth 3 (three) times daily as needed.   omeprazole 20 MG capsule Commonly known as: PRILOSEC Take 20 mg by mouth daily.   raloxifene 60 MG tablet Commonly known as: EVISTA Take 60 mg by mouth daily.   traMADol 50 MG tablet Commonly known as: ULTRAM Take 1 tablet (50 mg total) by mouth every 6 (six) hours as needed for moderate pain.   Trelegy Ellipta 100-62.5-25 MCG/INH Aepb Generic drug: Fluticasone-Umeclidin-Vilant Inhale 1 puff into the lungs daily.   vitamin C 500 MG tablet Commonly known as: ASCORBIC ACID Take 500 mg by mouth daily.       Contact information for follow-up providers    Lattie Corns, PA-C. Call.   Specialty: Physician Assistant Why: Call to schedule follow-up appointment. Contact information: [place]  [place] [address] [phone]        Dion Body, MD Follow up in 1 week(s).   Specialty: Family Medicine Contact information: [place] [address] [phone]            Contact information for after-discharge care    Destination    [facility] Preferred SNF .   Service: Skilled Nursing Contact information: [place] [phone]                 Allergies  Allergen Reactions  . Amlodipine Swelling     [Onset: 08/26/2013]feet and neck begin to swell  . Adhesive [Tape] Other (See Comments)    Skin is very thin and tears easily. Please use paper tape only  . Codeine Nausea Only  . Percocet [Oxycodone-Acetaminophen] Nausea And Vomiting    Patient took on an empty stomach. Good relief but unsettled. Still has not eaten today  . Sulfa Antibiotics Nausea Only and Rash    rash    Consultations:  orthopedics   Procedures/Studies: DG Chest 1 View  Result Date:  10/14/2020 CLINICAL DATA:  Congestive heart failure EXAM: CHEST  1 VIEW COMPARISON:  10/10/2020 FINDINGS: Slightly increased interstitial opacity bilaterally. No pleural effusion. Mild cardiomegaly. IMPRESSION: Cardiomegaly with slightly increased interstitial opacity, but no overt pulmonary edema. Electronically Signed   By: Ulyses Jarred M.D.   On: 10/14/2020 02:07   DG Chest 1 View  Result Date: 10/10/2020 CLINICAL DATA:  Status post trip and fall this morning. EXAM: CHEST  1 VIEW COMPARISON:  CT chest and single view of the chest 12/07/2018. FINDINGS: The lungs are emphysematous but clear. Cardiomegaly. Aortic atherosclerosis. No pneumothorax or pleural effusion. No acute or focal bony abnormality. IMPRESSION: No acute disease. Cardiomegaly. Aortic Atherosclerosis (ICD10-I70.0) and Emphysema (ICD10-J43.9). Electronically Signed   By: Inge Rise M.D.   On: 10/10/2020 13:25   DG HIP OPERATIVE UNILAT W OR W/O PELVIS RIGHT  Result Date: 10/10/2020 CLINICAL DATA:  Intramedullary nail placement EXAM: OPERATIVE RIGHT HIP (WITH PELVIS IF PERFORMED) 1 VIEWS TECHNIQUE: Fluoroscopic spot image(s) were submitted for interpretation post-operatively. COMPARISON:  X-ray from same day FINDINGS: Patient has undergone intramedullary nail placement for the previously demonstrated comminuted proximal right femur fracture. The osseous alignment is improved. The hardware is intact. There are expected postsurgical changes. There is a radiopaque foreign body at the level of the distal intramedullary nail that is favored to be external to the patient and may represent a sponge. IMPRESSION: Status post ORIF of the right femur with improved osseous alignment. Electronically Signed   By: Constance Holster M.D.   On: 10/10/2020 19:33   DG Hip Unilat W or Wo Pelvis 2-3 Views Right  Result Date: 10/10/2020 CLINICAL DATA:  Fall, right hip pain EXAM: DG HIP (WITH OR WITHOUT PELVIS) 2-3V RIGHT COMPARISON:  None. FINDINGS:  Acute comminuted and displaced fracture of the proximal right femur. Fracture involves the lesser trochanter and subtrochanteric aspects of the femur. There is greater than 1 shaft with of medial displacement. Anterior apex angulation. Hip joint is intact without dislocation. Pelvic bony ring appears intact. Large volume of stool within the rectum. IMPRESSION: 1. Acute comminuted and displaced fracture of the proximal right femur as described. 2. Large volume of stool within the rectum. Electronically Signed   By: Davina Poke D.O.   On: 10/10/2020 13:27       Subjective: Feels better today.   Discharge Exam:  Vitals:   10/16/20 0743 10/16/20 1138  BP: 139/65 (!) 110/56  Pulse: 79 77  Resp: 18 18  Temp: 98.4 F (36.9 C) 98.2 F (36.8 C)  SpO2: 99% 98%   Vitals:   10/16/20 0442 10/16/20 0500 10/16/20 0743 10/16/20 1138  BP: (!) 162/85  139/65 (!) 110/56  Pulse: 78  79 77  Resp: 18  18 18   Temp: 98.5 F (36.9 C)  98.4 F (36.9 C) 98.2 F (36.8 C)  TempSrc: Oral  Oral Oral  SpO2: 100%  99% 98%  Weight:  56.8 kg    Height:        General: Pt is alert, awake, not in acute distress Cardiovascular: RRR, S1/S2 +, no rubs, no gallops Respiratory: CTA bilaterally, no wheezing, no rhonchi Abdominal: Soft, NT, ND, bowel sounds + Extremities: no edema, no cyanosis    The results of significant diagnostics from this hospitalization (including imaging, microbiology, ancillary and laboratory) are listed below for reference.     Microbiology: Recent Results (from the past 240 hour(s))  Respiratory Panel by RT PCR (Flu A&B, Covid) - Nasopharyngeal Swab     Status: None   Collection Time: 10/10/20  1:38 PM   Specimen: Nasopharyngeal Swab  Result Value Ref Range Status   SARS Coronavirus 2 by RT PCR NEGATIVE NEGATIVE Final    Comment: (NOTE) SARS-CoV-2 target nucleic acids are NOT DETECTED.  The SARS-CoV-2 RNA is generally detectable in upper respiratoy specimens during the  acute phase of infection. The lowest concentration of SARS-CoV-2 viral copies this assay can detect is 131 copies/mL. A negative result does not preclude SARS-Cov-2 infection and should not be used as the sole basis for treatment or other patient management decisions. A negative result may occur with  improper specimen collection/handling, submission of specimen other than nasopharyngeal swab, presence of viral mutation(s) within the areas targeted by this assay, and inadequate number of viral copies (<131 copies/mL). A negative result must be combined with clinical observations, patient history, and epidemiological information. The expected result is Negative.  Fact Sheet for Patients:  PinkCheek.be  Fact Sheet for Healthcare Providers:  GravelBags.it  This test is no t yet approved or cleared by the [place] FDA and  has been authorized for detection and/or diagnosis of SARS-CoV-2 by FDA under an Emergency Use Authorization (EUA). This EUA will remain  in effect (meaning this test can be used) for the duration of the COVID-19 declaration under Section 564(b)(1) of the Act, 21 U.S.C. section 360bbb-3(b)(1), unless the authorization is terminated or revoked sooner.     Influenza A by PCR NEGATIVE NEGATIVE Final   Influenza B by PCR NEGATIVE NEGATIVE Final    Comment: (NOTE) The Xpert Xpress SARS-CoV-2/FLU/RSV assay is intended as an aid in  the diagnosis of influenza from Nasopharyngeal swab specimens and  should not be used as a sole basis for treatment. Nasal washings and  aspirates are unacceptable for Xpert Xpress SARS-CoV-2/FLU/RSV  testing.  Fact Sheet for Patients: PinkCheek.be  Fact Sheet for Healthcare Providers: GravelBags.it  This test is not yet approved or cleared by the [place] FDA and  has been authorized for detection and/or diagnosis  of SARS-CoV-2 by  FDA under an Emergency Use Authorization (EUA). This EUA will remain  in effect (meaning this test can be used) for the duration of the  Covid-19 declaration under Section 564(b)(1) of the Act, 21  U.S.C. section 360bbb-3(b)(1), unless the authorization is  terminated or revoked. Performed at [facility], [address] [place], [place] [address]   Culture, Urine     Status: None   Collection Time: 10/14/20  1:38 AM   Specimen: Urine, Clean Catch  Result Value Ref Range Status   Specimen Description   Final    URINE, CLEAN CATCH Performed at [facility], [address], [place], [place] [address]    Special Requests   Final    NONE Performed at [facility], [address] [place], [place] [address]    Culture   Final    NO GROWTH Performed at [facility], [place] [address], [place], [place] [address]    Report Status 10/15/2020 FINAL  Final  CULTURE, BLOOD (ROUTINE X 2) w Reflex to ID Panel     Status: None (Preliminary result)   Collection Time: 10/14/20  2:05 AM   Specimen: BLOOD  Result Value Ref Range Status   Specimen Description BLOOD BOTTLES DRAWN AEROBIC AND ANAEROBIC  Final   Special Requests ARM Blood Culture adequate volume  Final   Culture   Final    NO GROWTH 2 DAYS Performed at [facility], [address] [place], [place] [address]    Report Status PENDING  Incomplete  CULTURE, BLOOD (ROUTINE X 2) w Reflex to ID Panel     Status: None (Preliminary result)   Collection Time: 10/14/20  2:05 AM   Specimen: BLOOD  Result Value Ref Range Status   Specimen Description BLOOD BOTTLES DRAWN AEROBIC AND ANAEROBIC  Final   Special Requests LEFT ANTECUBITAL Blood Culture adequate volume  Final   Culture   Final    NO GROWTH 2 DAYS Performed at [facility], [address] [place], [place] [address]    Report Status PENDING  Incomplete     Labs: BNP (last 3 results) Recent Labs     10/12/20 0444  BNP 497.0*   Basic Metabolic Panel: Recent Labs  Lab 10/11/20 0602 10/11/20 0602 10/12/20 0444 10/13/20 0458 10/14/20 0159 10/14/20 1443 10/16/20 0352  NA 131*   < > 129* 129* 126* 129* 130*  K 5.0  --  4.8 4.8 3.7  --  3.8  CL 95*  --  93* 94* 87*  --  90*  CO2 29  --  28 29 31   --  35*  GLUCOSE 134*  --  134* 122* 127*  --  109*  BUN 14  --  23 19 13   --  14  CREATININE 0.95  --  1.09* 0.65 0.59  --  0.46  CALCIUM 8.3*  --  8.2* 8.3* 7.9*  --  7.8*   < > = values in this interval not displayed.   Liver Function Tests: No results for input(s): AST, ALT, ALKPHOS, BILITOT, PROT, ALBUMIN in the last 168 hours. No results for input(s): LIPASE, AMYLASE in the last 168 hours. No results for input(s): AMMONIA in the last 168 hours. CBC: Recent Labs  Lab 10/10/20 1245 10/10/20 1245 10/11/20 0602 10/11/20 2055 10/12/20 0444 10/13/20 0458 10/14/20 0159 10/15/20 0525 10/16/20 0352  WBC 9.9   < > 8.6  --  12.6* 9.1 7.3  --  5.2  NEUTROABS 8.4*  --   --   --   --   --   --   --   --   HGB 10.7*   < > 6.9*   < > 8.0* 6.8* 9.8* 8.6* 8.9*  HCT 31.8*   < > 21.5*  --  23.5* 20.2* 28.6*  --  27.0*  MCV 92.7   < > 96.8  --  91.8 93.5 90.2  --  93.1  PLT 118*   < > 93*  --  90* 92* 97*  --  143*   < > = values in this interval not displayed.   Cardiac Enzymes: Recent Labs  Lab 10/10/20 1245  CKTOTAL 75   BNP: Invalid input(s): POCBNP CBG: No results for input(s): GLUCAP in the last 168 hours. D-Dimer No results for input(s): DDIMER in the last 72 hours. Hgb A1c No results for input(s): HGBA1C in the last 72 hours. Lipid Profile No results for input(s): CHOL, HDL, LDLCALC, TRIG, CHOLHDL, LDLDIRECT in the last 72 hours. Thyroid function studies No results for input(s): TSH, T4TOTAL, T3FREE, THYROIDAB in the last 72 hours.  Invalid input(s): FREET3 Anemia work up No results for input(s): VITAMINB12, FOLATE, FERRITIN, TIBC, IRON, RETICCTPCT in the last 72  hours. Urinalysis    Component Value Date/Time   COLORURINE STRAW (A) 10/14/2020 0138   APPEARANCEUR CLEAR (A) 10/14/2020 0138   APPEARANCEUR Clear 07/26/2014 0034   LABSPEC 1.005 10/14/2020 0138   LABSPEC 1.010 07/26/2014 0034   PHURINE 6.0 10/14/2020 0138   GLUCOSEU NEGATIVE 10/14/2020 0138   GLUCOSEU Negative 07/26/2014 0034   HGBUR NEGATIVE 10/14/2020 0138   BILIRUBINUR NEGATIVE 10/14/2020 0138   BILIRUBINUR Negative 07/26/2014 0034   KETONESUR NEGATIVE 10/14/2020 0138   PROTEINUR NEGATIVE 10/14/2020 0138   NITRITE NEGATIVE 10/14/2020 0138   LEUKOCYTESUR NEGATIVE 10/14/2020 0138   LEUKOCYTESUR Trace 07/26/2014 0034   Sepsis Labs Invalid input(s): PROCALCITONIN,  WBC,  LACTICIDVEN Microbiology Recent Results (from the past 240 hour(s))  Respiratory Panel by RT PCR (Flu A&B, Covid) - Nasopharyngeal Swab     Status: None   Collection Time: 10/10/20  1:38 PM   Specimen: Nasopharyngeal Swab  Result Value Ref Range Status   SARS Coronavirus 2 by RT PCR NEGATIVE NEGATIVE Final    Comment: (NOTE) SARS-CoV-2 target nucleic acids are NOT DETECTED.  The SARS-CoV-2 RNA is generally detectable in upper respiratoy specimens during the acute phase of infection. The lowest concentration of SARS-CoV-2 viral copies this assay can detect is 131 copies/mL. A negative result does not preclude SARS-Cov-2 infection and should not be used as the sole basis for treatment or other patient management decisions. A negative result may occur with  improper specimen collection/handling, submission of specimen other than nasopharyngeal swab, presence of viral mutation(s) within the areas targeted by this assay, and inadequate number of viral copies (<131 copies/mL). A negative result must be combined with clinical observations, patient history, and epidemiological information. The expected result is Negative.  Fact Sheet for Patients:  PinkCheek.be  Fact Sheet for  Healthcare Providers:  GravelBags.it  This test is no t yet approved or cleared by the [place] FDA and  has been authorized for detection and/or diagnosis of SARS-CoV-2 by FDA under an Emergency Use Authorization (EUA). This EUA will remain  in effect (meaning this test can be used) for the duration of the COVID-19 declaration under Section 564(b)(1) of the Act, 21 U.S.C. section 360bbb-3(b)(1), unless the authorization is terminated or revoked sooner.     Influenza A by PCR NEGATIVE NEGATIVE Final   Influenza B by PCR NEGATIVE NEGATIVE Final    Comment: (NOTE) The Xpert Xpress SARS-CoV-2/FLU/RSV assay is intended as an aid in  the diagnosis of influenza from Nasopharyngeal swab specimens and  should not be used as a sole basis  for treatment. Nasal washings and  aspirates are unacceptable for Xpert Xpress SARS-CoV-2/FLU/RSV  testing.  Fact Sheet for Patients: PinkCheek.be  Fact Sheet for Healthcare Providers: GravelBags.it  This test is not yet approved or cleared by the [place] FDA and  has been authorized for detection and/or diagnosis of SARS-CoV-2 by  FDA under an Emergency Use Authorization (EUA). This EUA will remain  in effect (meaning this test can be used) for the duration of the  Covid-19 declaration under Section 564(b)(1) of the Act, 21  U.S.C. section 360bbb-3(b)(1), unless the authorization is  terminated or revoked. Performed at [facility], [address] [place], [place] [address]   Culture, Urine     Status: None   Collection Time: 10/14/20  1:38 AM   Specimen: Urine, Clean Catch  Result Value Ref Range Status   Specimen Description   Final    URINE, CLEAN CATCH Performed at [facility], [address], [place], [place] [address]    Special Requests   Final    NONE Performed at [facility], [address] [place],  [place] [address]    Culture   Final    NO GROWTH Performed at [facility], [place] [address] [place], [place] [address]    Report Status 10/15/2020 FINAL  Final  CULTURE, BLOOD (ROUTINE X 2) w Reflex to ID Panel     Status: None (Preliminary result)   Collection Time: 10/14/20  2:05 AM   Specimen: BLOOD  Result Value Ref Range Status   Specimen Description BLOOD BOTTLES DRAWN AEROBIC AND ANAEROBIC  Final   Special Requests ARM Blood Culture adequate volume  Final   Culture   Final    NO GROWTH 2 DAYS Performed at [facility], [address] [place], [place] [address]    Report Status PENDING  Incomplete  CULTURE, BLOOD (ROUTINE X 2) w Reflex to ID Panel     Status: None (Preliminary result)   Collection Time: 10/14/20  2:05 AM   Specimen: BLOOD  Result Value Ref Range Status   Specimen Description BLOOD BOTTLES DRAWN AEROBIC AND ANAEROBIC  Final   Special Requests LEFT ANTECUBITAL Blood Culture adequate volume  Final   Culture   Final    NO GROWTH 2 DAYS Performed at [facility], [address], [place], [place] [address]    Report Status PENDING  Incomplete     Time coordinating discharge: Over 30 minutes  SIGNED:   Nolberto Hanlon, MD  [facility] 10/16/2020, 3:04 PM Pager   If 7PM-7AM, please contact night-coverage [email or web address] Password [email or web address]

## 2020-10-16 NOTE — TOC Progression Note (Addendum)
Transition of Care [facility]) - Progression Note    Patient Details  Name: Melanie Huffman MRN: [id number] Date of Birth: 17-Nov-1928  Transition of Care [facility]) CM/SW [place], RN Phone Number: 10/16/2020, 8:06 AM  Clinical Narrative:   RNCM informed case of authorization has been sent to [facility] medical director for review. Patient was granted authorization for ambulance transport through [facility] which will be good for 90 days with an [place] # [phone].   9:30am- RNCM received phone call from [facility], peer to peer is being requested as medical director is initially denying authorization. Dr. Kurtis Bushman provided with information to complete peer to peer at [phone].     Expected Discharge Plan: [place] Barriers to Discharge: Continued Medical Work up, SNF Pending bed offer  Expected Discharge Plan and Services Expected Discharge Plan: [place] In-house Referral: Clinical Social Work   Post Acute Care Choice: [place] Living arrangements for the past 2 months: [place] ([place])                                       Social Determinants of Health ([place]) Interventions    Readmission Risk Interventions No flowsheet data found.

## 2020-10-16 NOTE — Progress Notes (Signed)
PROGRESS NOTE    Melanie Huffman  QHU:[id number] DOB: 1928-08-03 DOA: 10/10/2020 PCP: Dion Body, MD    Brief Narrative:  Melanie Huffman is a 86 y.o. female with medical history significant for COPD, CHF, hypertension, anxiety disorder who was brought into the emergency room by EMS for evaluation of a fall  11/16- remains afebrile >24hrs. Had 1 on 1 peer call  with Dr. Amalia Hailey for approval for SNF. Was on 4L...>3L at her baseline.     Consultants:   orthopedics  Procedures:   Antimicrobials:       Subjective: Patient did better today with physical therapy.  Reports her breathing is better than yesterday.  Daughter at bedside.   Objective: Vitals:   10/16/20 0442 10/16/20 0500 10/16/20 0743 10/16/20 1138  BP: (!) 162/85  139/65 (!) 110/56  Pulse: 78  79 77  Resp: 18  18 18   Temp: 98.5 F (36.9 C)  98.4 F (36.9 C) 98.2 F (36.8 C)  TempSrc: Oral  Oral Oral  SpO2: 100%  99% 98%  Weight:  56.8 kg    Height:        Intake/Output Summary (Last 24 hours) at 10/16/2020 1252 Last data filed at 10/16/2020 1000 Gross per 24 hour  Intake 1830 ml  Output --  Net 1830 ml   Filed Weights   10/10/20 1501 10/15/20 0500 10/16/20 0500  Weight: 51.3 kg 56.2 kg 56.8 kg    Examination: Sitting in chair, tired after doing PT, comfortable Minimal rales bilaterally, no wheezing Regular S1-S2 no gallops Soft benign positive bowel sounds Trace pedal edema bilaterally Awake alert and oriented, grossly intact Mood and affect appropriate in current setting   Data Reviewed: I have personally reviewed following labs and imaging studies  CBC: Recent Labs  Lab 10/10/20 1245 10/10/20 1245 10/11/20 0602 10/11/20 2055 10/12/20 0444 10/13/20 0458 10/14/20 0159 10/15/20 0525 10/16/20 0352  WBC 9.9   < > 8.6  --  12.6* 9.1 7.3  --  5.2  NEUTROABS 8.4*  --   --   --   --   --   --   --   --   HGB 10.7*   < > 6.9*   < > 8.0* 6.8* 9.8* 8.6* 8.9*  HCT 31.8*   < > 21.5*   --  23.5* 20.2* 28.6*  --  27.0*  MCV 92.7   < > 96.8  --  91.8 93.5 90.2  --  93.1  PLT 118*   < > 93*  --  90* 92* 97*  --  143*   < > = values in this interval not displayed.   Basic Metabolic Panel: Recent Labs  Lab 10/11/20 0602 10/11/20 0602 10/12/20 0444 10/13/20 0458 10/14/20 0159 10/14/20 1443 10/16/20 0352  NA 131*   < > 129* 129* 126* 129* 130*  K 5.0  --  4.8 4.8 3.7  --  3.8  CL 95*  --  93* 94* 87*  --  90*  CO2 29  --  28 29 31   --  35*  GLUCOSE 134*  --  134* 122* 127*  --  109*  BUN 14  --  23 19 13   --  14  CREATININE 0.95  --  1.09* 0.65 0.59  --  0.46  CALCIUM 8.3*  --  8.2* 8.3* 7.9*  --  7.8*   < > = values in this interval not displayed.   GFR: Estimated Creatinine Clearance: 40.4 mL/min (by  C-G formula based on SCr of 0.46 mg/dL). Liver Function Tests: No results for input(s): AST, ALT, ALKPHOS, BILITOT, PROT, ALBUMIN in the last 168 hours. No results for input(s): LIPASE, AMYLASE in the last 168 hours. No results for input(s): AMMONIA in the last 168 hours. Coagulation Profile: No results for input(s): INR, PROTIME in the last 168 hours. Cardiac Enzymes: Recent Labs  Lab 10/10/20 1245  CKTOTAL 75   BNP (last 3 results) No results for input(s): PROBNP in the last 8760 hours. HbA1C: No results for input(s): HGBA1C in the last 72 hours. CBG: No results for input(s): GLUCAP in the last 168 hours. Lipid Profile: No results for input(s): CHOL, HDL, LDLCALC, TRIG, CHOLHDL, LDLDIRECT in the last 72 hours. Thyroid Function Tests: No results for input(s): TSH, T4TOTAL, FREET4, T3FREE, THYROIDAB in the last 72 hours. Anemia Panel: No results for input(s): VITAMINB12, FOLATE, FERRITIN, TIBC, IRON, RETICCTPCT in the last 72 hours. Sepsis Labs: Recent Labs  Lab 10/14/20 0159 10/15/20 0525 10/16/20 0352  PROCALCITON 0.18 0.34 0.27    Recent Results (from the past 240 hour(s))  Respiratory Panel by RT PCR (Flu A&B, Covid) - Nasopharyngeal Swab      Status: None   Collection Time: 10/10/20  1:38 PM   Specimen: Nasopharyngeal Swab  Result Value Ref Range Status   SARS Coronavirus 2 by RT PCR NEGATIVE NEGATIVE Final    Comment: (NOTE) SARS-CoV-2 target nucleic acids are NOT DETECTED.  The SARS-CoV-2 RNA is generally detectable in upper respiratoy specimens during the acute phase of infection. The lowest concentration of SARS-CoV-2 viral copies this assay can detect is 131 copies/mL. A negative result does not preclude SARS-Cov-2 infection and should not be used as the sole basis for treatment or other patient management decisions. A negative result may occur with  improper specimen collection/handling, submission of specimen other than nasopharyngeal swab, presence of viral mutation(s) within the areas targeted by this assay, and inadequate number of viral copies (<131 copies/mL). A negative result must be combined with clinical observations, patient history, and epidemiological information. The expected result is Negative.  Fact Sheet for Patients:  PinkCheek.be  Fact Sheet for Healthcare Providers:  GravelBags.it  This test is no t yet approved or cleared by the [place] FDA and  has been authorized for detection and/or diagnosis of SARS-CoV-2 by FDA under an Emergency Use Authorization (EUA). This EUA will remain  in effect (meaning this test can be used) for the duration of the COVID-19 declaration under Section 564(b)(1) of the Act, 21 U.S.C. section 360bbb-3(b)(1), unless the authorization is terminated or revoked sooner.     Influenza A by PCR NEGATIVE NEGATIVE Final   Influenza B by PCR NEGATIVE NEGATIVE Final    Comment: (NOTE) The Xpert Xpress SARS-CoV-2/FLU/RSV assay is intended as an aid in  the diagnosis of influenza from Nasopharyngeal swab specimens and  should not be used as a sole basis for treatment. Nasal washings and  aspirates are  unacceptable for Xpert Xpress SARS-CoV-2/FLU/RSV  testing.  Fact Sheet for Patients: PinkCheek.be  Fact Sheet for Healthcare Providers: GravelBags.it  This test is not yet approved or cleared by the [place] FDA and  has been authorized for detection and/or diagnosis of SARS-CoV-2 by  FDA under an Emergency Use Authorization (EUA). This EUA will remain  in effect (meaning this test can be used) for the duration of the  Covid-19 declaration under Section 564(b)(1) of the Act, 21  U.S.C. section 360bbb-3(b)(1), unless the authorization is  terminated or revoked. Performed at [facility], [address] [place], [place] [address]   Culture, Urine     Status: None   Collection Time: 10/14/20  1:38 AM   Specimen: Urine, Clean Catch  Result Value Ref Range Status   Specimen Description   Final    URINE, CLEAN CATCH Performed at [facility] [address] [place], [place] [address]    Special Requests   Final    NONE Performed at [facility], [address], [place], [place] [address]    Culture   Final    NO GROWTH Performed at [facility], [place] [address], [place], [place] [address]    Report Status 10/15/2020 FINAL  Final  CULTURE, BLOOD (ROUTINE X 2) w Reflex to ID Panel     Status: None (Preliminary result)   Collection Time: 10/14/20  2:05 AM   Specimen: BLOOD  Result Value Ref Range Status   Specimen Description BLOOD BOTTLES DRAWN AEROBIC AND ANAEROBIC  Final   Special Requests ARM Blood Culture adequate volume  Final   Culture   Final    NO GROWTH 2 DAYS Performed at [facility], [address] [place], [place] [address]    Report Status PENDING  Incomplete  CULTURE, BLOOD (ROUTINE X 2) w Reflex to ID Panel     Status: None (Preliminary result)   Collection Time: 10/14/20  2:05 AM   Specimen: BLOOD  Result Value Ref Range Status   Specimen  Description BLOOD BOTTLES DRAWN AEROBIC AND ANAEROBIC  Final   Special Requests LEFT ANTECUBITAL Blood Culture adequate volume  Final   Culture   Final    NO GROWTH 2 DAYS Performed at [facility] [address] [place],  [address]    Report Status PENDING  Incomplete         Radiology Studies: No results found.      Scheduled Meds: . sodium chloride   Intravenous Once  . vitamin C  500 mg Oral Daily  . calcium carbonate  1 tablet Oral Q breakfast   And  . cholecalciferol  400 Units Oral Daily  . carvedilol  6.25 mg Oral BID WC  . Chlorhexidine Gluconate Cloth  6 each Topical Q2200  . docusate sodium  100 mg Oral BID  . enoxaparin (LOVENOX) injection  40 mg Subcutaneous Q24H  . escitalopram  10 mg Oral Daily  . feeding supplement  237 mL Oral BID BM  . umeclidinium bromide  1 puff Inhalation Daily   And  . fluticasone furoate-vilanterol  1 puff Inhalation Daily  . multivitamin with minerals   Oral Daily  . pantoprazole  40 mg Oral Daily  . raloxifene  60 mg Oral Daily   Continuous Infusions:   Assessment & Plan:   Principal Problem:   Fracture of femoral neck, right, closed ([place]) Active Problems:   Hypokalemia   HTN (hypertension)   Fall   Chronic systolic CHF (congestive heart failure) (HCC)   COPD (chronic obstructive pulmonary disease) with emphysema (HCC)   Thrombocytopenia (HCC)   Advanced care planning/counseling discussion   Goals of care, counseling/discussion   Palliative care by specialist  Fracture of femoral neck-s/p fall Status post ORIF on 11/10 by Dr. Roland Rack  Bowel regimen  Pain control  SNF authorization pending - I did peer review with Dr. Amalia Hailey for authorization today. Awaiting approval covid test for snf   Postop urinary retention -Foley was placed  Will do voiding trial tomorrow, if still with urinary retention will replace foley and can do voiding trial at snf.    Fever -Tmax 101.6.  No  leukocytosis Possibly due to postop versus blood transfusion she had received.   Has remained afebrile for the last 48 hours  UA negative  Chest x-ray no pneumonia  Blood cultures to date negative  Continue to monitor      Acute respiratory distresse- likely 2/2 fluid overload.  Received lasix 20mg  iv x1 on 11/15 Still mildly volume overload, although now down from 4L to 3L baseline o2 requirement. Will give another lasix 20mg  iv x1 today Daily weight Monitor I's and O's   Acute /Chronic systolic heart failure Last known LVEF of 45% 11/14- overnight likely volume overloaded, given lasix, with improvement this am. cxr obtained , no overt pulmonary edema. Likely 2/2 transfusion 11/15-received 2 Lasix 20 mg IV x1  11/16- still mildly volume overloaded,will give lasix 20mg  iv x1     Hypertension BP low post surgery, will discontinue blood pressure medications Gentle hydration, avoid volume overloa 11/14- starting to increase, will add coreg 3.125 bid with parameters 11/15- slowly increasing, will increase coreg to 6.25 mg bid 11/16- bp stable. Will not increase coreg any further. Also getting lasix today again.     Post-op Anemia with acute blood loss-  Hg was 6.9 , S/p 1unit prbc on 11/11. 11/13- Hg 6.8 this am, will transfuse 1 unit prbc today Lasix 20mg  iv post transfusion 11/14-hemoglobin 9.8 11/15-stool occult negative. Hg. 8.6 11/16-hemoglobin remained stable at 8.9 Transfuse if hemoglobin less than 7 Continue to monitor periodically     COPD with chronic respiratory failure Without acute exacerbation, chronically on 3 L continuous oxygen, currently at baseline  Continue inhalers     Depression  Continue lexapro      Hypokalemia Secondary to diuretic use Was supplemented  K is 5.0, will discontinue all potassium supplementation to prevent further increase 11/15Ck am labs since received lasix today   Hyponatremia- Na 129- etiology unclear.   Initially was due to decreased p.o. intake and dehydration, was given IV fluids with improvement Now she is volume overloaded, sodium levels improving with diuresis. Sodium today 130 Continue to monitor   Chronic thrombocytopenia Platelets close to baseline No signs of overt bleed. Continue to monitor    DVT prophylaxis: Lovenox Code Status: DNR Family Communication: Daughter at bedside    Status is: Inpatient  Remains inpatient appropriate because:Inpatient level of care appropriate due to severity of illness and unsafe discharge   Dispo: The patient is from: Home              Anticipated d/c is to: SNF              Anticipated d/c date is: 1               Patient currently is not medically stable to d/c.  Needs iv lasix today.. Likely dc in am if SNF authorizatioin /approval occurs.          LOS: 6 days   Time spent: 35 minutes with more than 50% on [place], MD [facility] Pager [phone]  If 7PM-7AM, please contact night-coverage [email or web address] Password [facility] 10/16/2020, 12:52 PM

## 2020-10-17 DIAGNOSIS — R339 Retention of urine, unspecified: Secondary | ICD-10-CM | POA: Diagnosis not present

## 2020-10-17 DIAGNOSIS — E43 Unspecified severe protein-calorie malnutrition: Secondary | ICD-10-CM | POA: Insufficient documentation

## 2020-10-17 DIAGNOSIS — M25579 Pain in unspecified ankle and joints of unspecified foot: Secondary | ICD-10-CM | POA: Diagnosis not present

## 2020-10-17 DIAGNOSIS — K5900 Constipation, unspecified: Secondary | ICD-10-CM | POA: Diagnosis not present

## 2020-10-17 DIAGNOSIS — S72001D Fracture of unspecified part of neck of right femur, subsequent encounter for closed fracture with routine healing: Secondary | ICD-10-CM | POA: Diagnosis not present

## 2020-10-17 DIAGNOSIS — R1310 Dysphagia, unspecified: Secondary | ICD-10-CM | POA: Diagnosis not present

## 2020-10-17 NOTE — Progress Notes (Signed)
Called report to [place] at [employer]. Unable to print AVS on 11/16

## 2020-10-19 DIAGNOSIS — S72001D Fracture of unspecified part of neck of right femur, subsequent encounter for closed fracture with routine healing: Secondary | ICD-10-CM | POA: Diagnosis not present

## 2020-10-19 DIAGNOSIS — L89896 Pressure-induced deep tissue damage of other site: Secondary | ICD-10-CM | POA: Diagnosis not present

## 2020-10-19 LAB — CULTURE, BLOOD (ROUTINE X 2)
Culture: NO GROWTH
Culture: NO GROWTH
Special Requests: ADEQUATE
Special Requests: ADEQUATE

## 2020-10-31 DIAGNOSIS — L97411 Non-pressure chronic ulcer of right heel and midfoot limited to breakdown of skin: Secondary | ICD-10-CM | POA: Diagnosis not present

## 2020-10-31 DIAGNOSIS — L03115 Cellulitis of right lower limb: Secondary | ICD-10-CM | POA: Diagnosis not present

## 2020-10-31 DIAGNOSIS — L600 Ingrowing nail: Secondary | ICD-10-CM | POA: Diagnosis not present

## 2020-10-31 DIAGNOSIS — M79675 Pain in left toe(s): Secondary | ICD-10-CM | POA: Diagnosis not present

## 2020-10-31 DIAGNOSIS — B351 Tinea unguium: Secondary | ICD-10-CM | POA: Diagnosis not present

## 2020-10-31 DIAGNOSIS — M79674 Pain in right toe(s): Secondary | ICD-10-CM | POA: Diagnosis not present

## 2020-11-09 DIAGNOSIS — S72001D Fracture of unspecified part of neck of right femur, subsequent encounter for closed fracture with routine healing: Secondary | ICD-10-CM | POA: Diagnosis not present

## 2020-11-09 DIAGNOSIS — R42 Dizziness and giddiness: Secondary | ICD-10-CM | POA: Diagnosis not present

## 2020-11-09 DIAGNOSIS — R110 Nausea: Secondary | ICD-10-CM | POA: Diagnosis not present

## 2020-11-12 DIAGNOSIS — R110 Nausea: Secondary | ICD-10-CM | POA: Diagnosis not present

## 2020-11-12 DIAGNOSIS — S72001D Fracture of unspecified part of neck of right femur, subsequent encounter for closed fracture with routine healing: Secondary | ICD-10-CM | POA: Diagnosis not present

## 2020-11-12 DIAGNOSIS — R42 Dizziness and giddiness: Secondary | ICD-10-CM | POA: Diagnosis not present

## 2020-11-13 DIAGNOSIS — L97411 Non-pressure chronic ulcer of right heel and midfoot limited to breakdown of skin: Secondary | ICD-10-CM | POA: Diagnosis not present

## 2020-11-13 DIAGNOSIS — S72001D Fracture of unspecified part of neck of right femur, subsequent encounter for closed fracture with routine healing: Secondary | ICD-10-CM | POA: Diagnosis not present

## 2020-11-13 DIAGNOSIS — R110 Nausea: Secondary | ICD-10-CM | POA: Diagnosis not present

## 2020-11-13 DIAGNOSIS — K567 Ileus, unspecified: Secondary | ICD-10-CM | POA: Diagnosis not present

## 2020-11-14 DIAGNOSIS — R110 Nausea: Secondary | ICD-10-CM | POA: Diagnosis not present

## 2020-11-14 DIAGNOSIS — L89896 Pressure-induced deep tissue damage of other site: Secondary | ICD-10-CM | POA: Diagnosis not present

## 2020-11-14 DIAGNOSIS — S72001D Fracture of unspecified part of neck of right femur, subsequent encounter for closed fracture with routine healing: Secondary | ICD-10-CM | POA: Diagnosis not present

## 2020-11-14 DIAGNOSIS — K567 Ileus, unspecified: Secondary | ICD-10-CM | POA: Diagnosis not present

## 2020-11-22 DIAGNOSIS — F411 Generalized anxiety disorder: Secondary | ICD-10-CM | POA: Diagnosis not present

## 2020-11-22 DIAGNOSIS — J449 Chronic obstructive pulmonary disease, unspecified: Secondary | ICD-10-CM | POA: Diagnosis not present

## 2020-11-22 DIAGNOSIS — K5900 Constipation, unspecified: Secondary | ICD-10-CM | POA: Diagnosis not present

## 2020-11-22 DIAGNOSIS — I10 Essential (primary) hypertension: Secondary | ICD-10-CM | POA: Diagnosis not present

## 2020-11-22 DIAGNOSIS — S72001D Fracture of unspecified part of neck of right femur, subsequent encounter for closed fracture with routine healing: Secondary | ICD-10-CM | POA: Diagnosis not present

## 2020-11-22 DIAGNOSIS — D649 Anemia, unspecified: Secondary | ICD-10-CM | POA: Diagnosis not present

## 2020-11-22 DIAGNOSIS — M6281 Muscle weakness (generalized): Secondary | ICD-10-CM | POA: Diagnosis not present

## 2020-11-26 DIAGNOSIS — B351 Tinea unguium: Secondary | ICD-10-CM | POA: Diagnosis not present

## 2020-11-26 DIAGNOSIS — S7221XD Displaced subtrochanteric fracture of right femur, subsequent encounter for closed fracture with routine healing: Secondary | ICD-10-CM | POA: Diagnosis not present

## 2020-11-26 DIAGNOSIS — L03115 Cellulitis of right lower limb: Secondary | ICD-10-CM | POA: Diagnosis not present

## 2020-11-26 DIAGNOSIS — L89613 Pressure ulcer of right heel, stage 3: Secondary | ICD-10-CM | POA: Diagnosis not present

## 2020-11-26 DIAGNOSIS — L97412 Non-pressure chronic ulcer of right heel and midfoot with fat layer exposed: Secondary | ICD-10-CM | POA: Diagnosis not present

## 2020-11-26 DIAGNOSIS — L600 Ingrowing nail: Secondary | ICD-10-CM | POA: Diagnosis not present

## 2020-11-26 DIAGNOSIS — Z9889 Other specified postprocedural states: Secondary | ICD-10-CM | POA: Diagnosis not present

## 2020-11-26 DIAGNOSIS — Z8781 Personal history of (healed) traumatic fracture: Secondary | ICD-10-CM | POA: Diagnosis not present

## 2020-11-27 DIAGNOSIS — Z853 Personal history of malignant neoplasm of breast: Secondary | ICD-10-CM | POA: Diagnosis not present

## 2020-11-27 DIAGNOSIS — S7221XD Displaced subtrochanteric fracture of right femur, subsequent encounter for closed fracture with routine healing: Secondary | ICD-10-CM | POA: Diagnosis not present

## 2020-11-27 DIAGNOSIS — I10 Essential (primary) hypertension: Secondary | ICD-10-CM | POA: Diagnosis not present

## 2020-11-27 DIAGNOSIS — I700 Atherosclerosis of aorta: Secondary | ICD-10-CM | POA: Diagnosis not present

## 2020-11-27 DIAGNOSIS — I5189 Other ill-defined heart diseases: Secondary | ICD-10-CM | POA: Diagnosis not present

## 2020-11-27 DIAGNOSIS — Z66 Do not resuscitate: Secondary | ICD-10-CM | POA: Diagnosis not present

## 2020-11-27 DIAGNOSIS — I5022 Chronic systolic (congestive) heart failure: Secondary | ICD-10-CM | POA: Diagnosis not present

## 2020-12-10 DIAGNOSIS — J9601 Acute respiratory failure with hypoxia: Secondary | ICD-10-CM | POA: Diagnosis not present

## 2020-12-10 DIAGNOSIS — J449 Chronic obstructive pulmonary disease, unspecified: Secondary | ICD-10-CM | POA: Diagnosis not present

## 2020-12-11 DIAGNOSIS — B351 Tinea unguium: Secondary | ICD-10-CM | POA: Diagnosis not present

## 2020-12-11 DIAGNOSIS — L97412 Non-pressure chronic ulcer of right heel and midfoot with fat layer exposed: Secondary | ICD-10-CM | POA: Diagnosis not present

## 2020-12-11 DIAGNOSIS — I739 Peripheral vascular disease, unspecified: Secondary | ICD-10-CM | POA: Diagnosis not present

## 2020-12-13 DIAGNOSIS — J449 Chronic obstructive pulmonary disease, unspecified: Secondary | ICD-10-CM | POA: Diagnosis not present

## 2020-12-13 DIAGNOSIS — Z136 Encounter for screening for cardiovascular disorders: Secondary | ICD-10-CM | POA: Diagnosis not present

## 2020-12-13 DIAGNOSIS — I10 Essential (primary) hypertension: Secondary | ICD-10-CM | POA: Diagnosis not present

## 2020-12-13 DIAGNOSIS — D696 Thrombocytopenia, unspecified: Secondary | ICD-10-CM | POA: Diagnosis not present

## 2020-12-13 DIAGNOSIS — E871 Hypo-osmolality and hyponatremia: Secondary | ICD-10-CM | POA: Diagnosis not present

## 2020-12-13 DIAGNOSIS — Z0000 Encounter for general adult medical examination without abnormal findings: Secondary | ICD-10-CM | POA: Diagnosis not present

## 2020-12-13 DIAGNOSIS — I5022 Chronic systolic (congestive) heart failure: Secondary | ICD-10-CM | POA: Diagnosis not present

## 2020-12-14 DIAGNOSIS — D485 Neoplasm of uncertain behavior of skin: Secondary | ICD-10-CM | POA: Diagnosis not present

## 2020-12-14 DIAGNOSIS — L538 Other specified erythematous conditions: Secondary | ICD-10-CM | POA: Diagnosis not present

## 2020-12-14 DIAGNOSIS — L820 Inflamed seborrheic keratosis: Secondary | ICD-10-CM | POA: Diagnosis not present

## 2020-12-14 DIAGNOSIS — D0439 Carcinoma in situ of skin of other parts of face: Secondary | ICD-10-CM | POA: Diagnosis not present

## 2020-12-21 DIAGNOSIS — S72001D Fracture of unspecified part of neck of right femur, subsequent encounter for closed fracture with routine healing: Secondary | ICD-10-CM | POA: Diagnosis not present

## 2020-12-21 DIAGNOSIS — K5900 Constipation, unspecified: Secondary | ICD-10-CM | POA: Diagnosis not present

## 2020-12-21 DIAGNOSIS — F411 Generalized anxiety disorder: Secondary | ICD-10-CM | POA: Diagnosis not present

## 2020-12-21 DIAGNOSIS — L89616 Pressure-induced deep tissue damage of right heel: Secondary | ICD-10-CM | POA: Diagnosis not present

## 2020-12-21 DIAGNOSIS — I10 Essential (primary) hypertension: Secondary | ICD-10-CM | POA: Diagnosis not present

## 2020-12-21 DIAGNOSIS — L298 Other pruritus: Secondary | ICD-10-CM | POA: Diagnosis not present

## 2020-12-21 DIAGNOSIS — J9621 Acute and chronic respiratory failure with hypoxia: Secondary | ICD-10-CM | POA: Diagnosis not present

## 2020-12-21 DIAGNOSIS — D649 Anemia, unspecified: Secondary | ICD-10-CM | POA: Diagnosis not present

## 2020-12-21 DIAGNOSIS — M6281 Muscle weakness (generalized): Secondary | ICD-10-CM | POA: Diagnosis not present

## 2020-12-28 DIAGNOSIS — S7221XD Displaced subtrochanteric fracture of right femur, subsequent encounter for closed fracture with routine healing: Secondary | ICD-10-CM | POA: Diagnosis not present

## 2020-12-28 DIAGNOSIS — L97412 Non-pressure chronic ulcer of right heel and midfoot with fat layer exposed: Secondary | ICD-10-CM | POA: Diagnosis not present

## 2020-12-28 DIAGNOSIS — I739 Peripheral vascular disease, unspecified: Secondary | ICD-10-CM | POA: Diagnosis not present

## 2020-12-31 DIAGNOSIS — M6281 Muscle weakness (generalized): Secondary | ICD-10-CM | POA: Diagnosis not present

## 2020-12-31 DIAGNOSIS — R296 Repeated falls: Secondary | ICD-10-CM | POA: Diagnosis not present

## 2020-12-31 DIAGNOSIS — L89616 Pressure-induced deep tissue damage of right heel: Secondary | ICD-10-CM | POA: Diagnosis not present

## 2021-01-01 DIAGNOSIS — R498 Other voice and resonance disorders: Secondary | ICD-10-CM | POA: Diagnosis not present

## 2021-01-01 DIAGNOSIS — J449 Chronic obstructive pulmonary disease, unspecified: Secondary | ICD-10-CM | POA: Diagnosis not present

## 2021-01-01 DIAGNOSIS — R488 Other symbolic dysfunctions: Secondary | ICD-10-CM | POA: Diagnosis not present

## 2021-01-01 DIAGNOSIS — R1312 Dysphagia, oropharyngeal phase: Secondary | ICD-10-CM | POA: Diagnosis not present

## 2021-01-01 DIAGNOSIS — F338 Other recurrent depressive disorders: Secondary | ICD-10-CM | POA: Diagnosis not present

## 2021-01-01 DIAGNOSIS — Z23 Encounter for immunization: Secondary | ICD-10-CM | POA: Diagnosis not present

## 2021-01-01 DIAGNOSIS — I10 Essential (primary) hypertension: Secondary | ICD-10-CM | POA: Diagnosis not present

## 2021-01-01 DIAGNOSIS — Z111 Encounter for screening for respiratory tuberculosis: Secondary | ICD-10-CM | POA: Diagnosis not present

## 2021-01-01 DIAGNOSIS — M6281 Muscle weakness (generalized): Secondary | ICD-10-CM | POA: Diagnosis not present

## 2021-01-01 DIAGNOSIS — E222 Syndrome of inappropriate secretion of antidiuretic hormone: Secondary | ICD-10-CM | POA: Diagnosis not present

## 2021-01-01 DIAGNOSIS — I509 Heart failure, unspecified: Secondary | ICD-10-CM | POA: Diagnosis not present

## 2021-01-01 DIAGNOSIS — L89616 Pressure-induced deep tissue damage of right heel: Secondary | ICD-10-CM | POA: Diagnosis not present

## 2021-01-01 DIAGNOSIS — S72001D Fracture of unspecified part of neck of right femur, subsequent encounter for closed fracture with routine healing: Secondary | ICD-10-CM | POA: Diagnosis not present

## 2021-01-01 DIAGNOSIS — F419 Anxiety disorder, unspecified: Secondary | ICD-10-CM | POA: Diagnosis not present

## 2021-01-01 DIAGNOSIS — J9621 Acute and chronic respiratory failure with hypoxia: Secondary | ICD-10-CM | POA: Diagnosis not present

## 2021-01-01 DIAGNOSIS — T887XXA Unspecified adverse effect of drug or medicament, initial encounter: Secondary | ICD-10-CM | POA: Diagnosis not present

## 2021-01-01 DIAGNOSIS — Z022 Encounter for examination for admission to residential institution: Secondary | ICD-10-CM | POA: Diagnosis not present

## 2021-01-01 DIAGNOSIS — K2100 Gastro-esophageal reflux disease with esophagitis, without bleeding: Secondary | ICD-10-CM | POA: Diagnosis not present

## 2021-01-04 DIAGNOSIS — M6281 Muscle weakness (generalized): Secondary | ICD-10-CM | POA: Diagnosis not present

## 2021-01-04 DIAGNOSIS — L89616 Pressure-induced deep tissue damage of right heel: Secondary | ICD-10-CM | POA: Diagnosis not present

## 2021-01-04 DIAGNOSIS — T887XXA Unspecified adverse effect of drug or medicament, initial encounter: Secondary | ICD-10-CM | POA: Diagnosis not present

## 2021-01-07 DIAGNOSIS — Z022 Encounter for examination for admission to residential institution: Secondary | ICD-10-CM | POA: Diagnosis not present

## 2021-01-07 DIAGNOSIS — L89616 Pressure-induced deep tissue damage of right heel: Secondary | ICD-10-CM | POA: Diagnosis not present

## 2021-01-07 DIAGNOSIS — S72001D Fracture of unspecified part of neck of right femur, subsequent encounter for closed fracture with routine healing: Secondary | ICD-10-CM | POA: Diagnosis not present

## 2021-01-07 DIAGNOSIS — Z111 Encounter for screening for respiratory tuberculosis: Secondary | ICD-10-CM | POA: Diagnosis not present

## 2021-01-07 DIAGNOSIS — J9621 Acute and chronic respiratory failure with hypoxia: Secondary | ICD-10-CM | POA: Diagnosis not present

## 2021-01-07 DIAGNOSIS — M6281 Muscle weakness (generalized): Secondary | ICD-10-CM | POA: Diagnosis not present

## 2021-01-07 DIAGNOSIS — T887XXA Unspecified adverse effect of drug or medicament, initial encounter: Secondary | ICD-10-CM | POA: Diagnosis not present

## 2021-01-07 DIAGNOSIS — I10 Essential (primary) hypertension: Secondary | ICD-10-CM | POA: Diagnosis not present

## 2021-01-07 DIAGNOSIS — Z23 Encounter for immunization: Secondary | ICD-10-CM | POA: Diagnosis not present

## 2021-01-10 DIAGNOSIS — L97412 Non-pressure chronic ulcer of right heel and midfoot with fat layer exposed: Secondary | ICD-10-CM | POA: Diagnosis not present

## 2021-01-10 DIAGNOSIS — I739 Peripheral vascular disease, unspecified: Secondary | ICD-10-CM | POA: Diagnosis not present

## 2021-01-10 DIAGNOSIS — J449 Chronic obstructive pulmonary disease, unspecified: Secondary | ICD-10-CM | POA: Diagnosis not present

## 2021-01-10 DIAGNOSIS — J9601 Acute respiratory failure with hypoxia: Secondary | ICD-10-CM | POA: Diagnosis not present

## 2021-01-11 DIAGNOSIS — J449 Chronic obstructive pulmonary disease, unspecified: Secondary | ICD-10-CM | POA: Diagnosis not present

## 2021-01-11 DIAGNOSIS — I5022 Chronic systolic (congestive) heart failure: Secondary | ICD-10-CM | POA: Diagnosis not present

## 2021-01-11 DIAGNOSIS — I10 Essential (primary) hypertension: Secondary | ICD-10-CM | POA: Diagnosis not present

## 2021-01-11 DIAGNOSIS — D696 Thrombocytopenia, unspecified: Secondary | ICD-10-CM | POA: Diagnosis not present

## 2021-01-17 DIAGNOSIS — Z9181 History of falling: Secondary | ICD-10-CM | POA: Diagnosis not present

## 2021-01-17 DIAGNOSIS — S91111D Laceration without foreign body of right great toe without damage to nail, subsequent encounter: Secondary | ICD-10-CM | POA: Diagnosis not present

## 2021-01-17 DIAGNOSIS — S0181XD Laceration without foreign body of other part of head, subsequent encounter: Secondary | ICD-10-CM | POA: Diagnosis not present

## 2021-01-17 DIAGNOSIS — G629 Polyneuropathy, unspecified: Secondary | ICD-10-CM | POA: Diagnosis not present

## 2021-01-17 DIAGNOSIS — I739 Peripheral vascular disease, unspecified: Secondary | ICD-10-CM | POA: Diagnosis not present

## 2021-01-17 DIAGNOSIS — I5022 Chronic systolic (congestive) heart failure: Secondary | ICD-10-CM | POA: Diagnosis not present

## 2021-01-17 DIAGNOSIS — M810 Age-related osteoporosis without current pathological fracture: Secondary | ICD-10-CM | POA: Diagnosis not present

## 2021-01-17 DIAGNOSIS — Z7951 Long term (current) use of inhaled steroids: Secondary | ICD-10-CM | POA: Diagnosis not present

## 2021-01-17 DIAGNOSIS — L89613 Pressure ulcer of right heel, stage 3: Secondary | ICD-10-CM | POA: Diagnosis not present

## 2021-01-17 DIAGNOSIS — J449 Chronic obstructive pulmonary disease, unspecified: Secondary | ICD-10-CM | POA: Diagnosis not present

## 2021-01-22 DIAGNOSIS — D696 Thrombocytopenia, unspecified: Secondary | ICD-10-CM | POA: Diagnosis not present

## 2021-01-22 DIAGNOSIS — Z6820 Body mass index (BMI) 20.0-20.9, adult: Secondary | ICD-10-CM | POA: Diagnosis not present

## 2021-01-22 DIAGNOSIS — I10 Essential (primary) hypertension: Secondary | ICD-10-CM | POA: Diagnosis not present

## 2021-01-22 DIAGNOSIS — I5022 Chronic systolic (congestive) heart failure: Secondary | ICD-10-CM | POA: Diagnosis not present

## 2021-01-22 DIAGNOSIS — J449 Chronic obstructive pulmonary disease, unspecified: Secondary | ICD-10-CM | POA: Diagnosis not present

## 2021-01-22 DIAGNOSIS — L89613 Pressure ulcer of right heel, stage 3: Secondary | ICD-10-CM | POA: Diagnosis not present

## 2021-01-22 DIAGNOSIS — R296 Repeated falls: Secondary | ICD-10-CM | POA: Diagnosis not present

## 2021-01-22 DIAGNOSIS — I739 Peripheral vascular disease, unspecified: Secondary | ICD-10-CM | POA: Diagnosis not present

## 2023-04-01 DEATH — deceased
